# Patient Record
Sex: Male | Born: 1935 | Race: White | Hispanic: No | Smoking: Former smoker
Health system: Southern US, Community
[De-identification: ages and names within clinical notes are randomized; demographics above are authoritative.]

## PROBLEM LIST (undated history)

## (undated) DIAGNOSIS — K219 Gastro-esophageal reflux disease without esophagitis: Secondary | ICD-10-CM

## (undated) DIAGNOSIS — T8182XA Emphysema (subcutaneous) resulting from a procedure, initial encounter: Secondary | ICD-10-CM

## (undated) DIAGNOSIS — I714 Abdominal aortic aneurysm, without rupture: Secondary | ICD-10-CM

## (undated) DIAGNOSIS — I10 Essential (primary) hypertension: Secondary | ICD-10-CM

## (undated) DIAGNOSIS — J309 Allergic rhinitis, unspecified: Secondary | ICD-10-CM

## (undated) DIAGNOSIS — R430 Anosmia: Secondary | ICD-10-CM

## (undated) DIAGNOSIS — K5790 Diverticulosis of intestine, part unspecified, without perforation or abscess without bleeding: Secondary | ICD-10-CM

## (undated) DIAGNOSIS — N400 Enlarged prostate without lower urinary tract symptoms: Secondary | ICD-10-CM

## (undated) DIAGNOSIS — Z8719 Personal history of other diseases of the digestive system: Secondary | ICD-10-CM

## (undated) DIAGNOSIS — I739 Peripheral vascular disease, unspecified: Secondary | ICD-10-CM

## (undated) DIAGNOSIS — J449 Chronic obstructive pulmonary disease, unspecified: Secondary | ICD-10-CM

## (undated) HISTORY — PX: CHOLECYSTECTOMY: SHX55

## (undated) HISTORY — DX: Anosmia: R43.0

## (undated) HISTORY — DX: Gastro-esophageal reflux disease without esophagitis: K21.9

## (undated) HISTORY — PX: APPENDECTOMY: SHX54

## (undated) HISTORY — PX: COLONOSCOPY: SHX174

## (undated) HISTORY — DX: Allergic rhinitis, unspecified: J30.9

## (undated) HISTORY — DX: Diverticulosis of intestine, part unspecified, without perforation or abscess without bleeding: K57.90

## (undated) HISTORY — PX: SKIN CANCER EXCISION: SHX779

## (undated) HISTORY — DX: Chronic obstructive pulmonary disease, unspecified: J44.9

---

## 2003-10-19 ENCOUNTER — Encounter: Admission: RE | Admit: 2003-10-19 | Discharge: 2003-10-19 | Payer: Self-pay | Admitting: Neurosurgery

## 2003-12-18 ENCOUNTER — Other Ambulatory Visit: Payer: Self-pay

## 2004-12-25 IMAGING — CT CT ANGIO HEAD
3 of 4 series · 13 of 30 positions shown · IV contrast (OMNI 350)
Comparison: none

CLINICAL DATA: Patient with right middle cerebral artery aneurysm ? follow-up examination.
CT ANGIO HEAD
After an explanation of the procedure, an informed consent was obtained.  Initially a CT of the brain without infusion was performed.  This was then followed by infusion of approximately 150 cc of Omnipaque 350 in antecubital vein followed by contiguous transaxial thin axial acquisitions from the level of approximately C4 to the cranial vertex.  The information was then transferred on to a work station where 3D reformations were performed in the axial, coronal and sagittal planes.
There were no acute complications. The patient tolerated the procedure well.

[Series 2: — · axial · 0.49mm/px · z∈[-53,+32]mm · 2 of 51 slices shown (1 of 2)]
[im 17/51  brain]
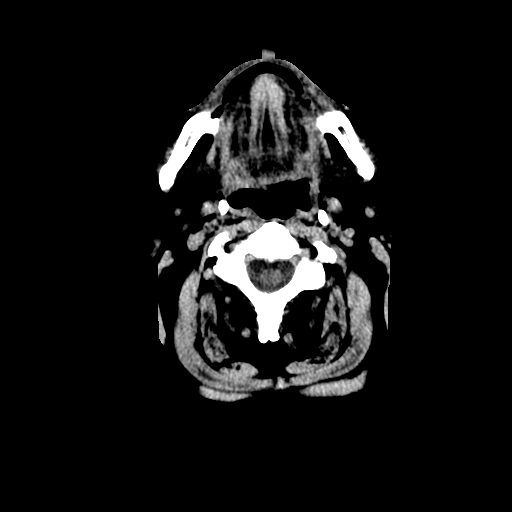
[im 34/51  bone]
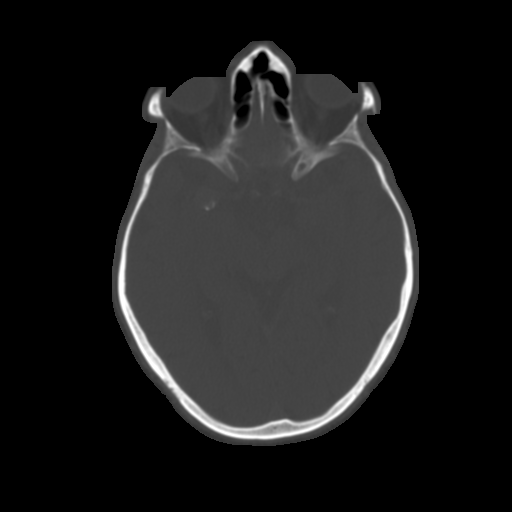

[Series 4: — · axial · 0.33mm/px · z∈[-48,+60]mm · 3 of 87 slices shown (2 of 2)]
[im 22/87  brain]
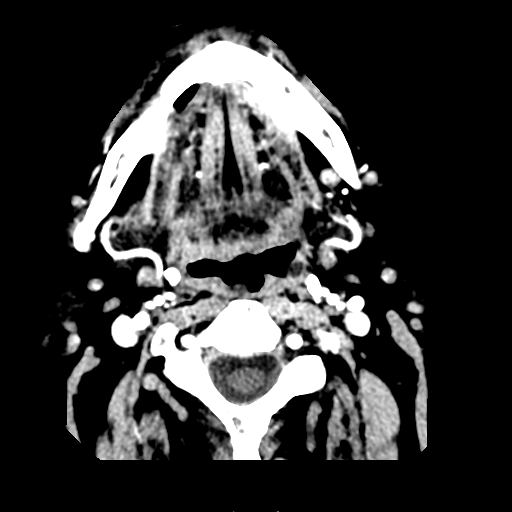
[im 44/87  brain]
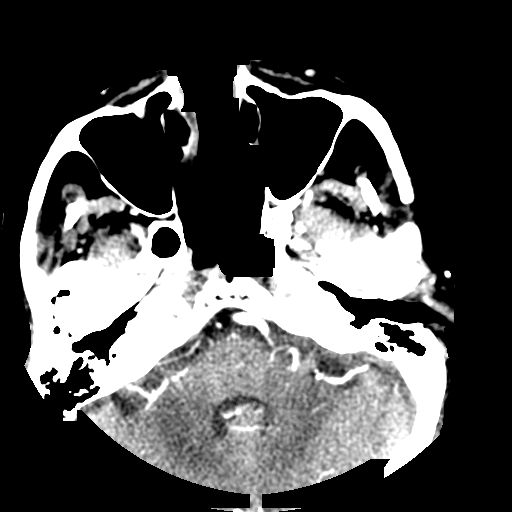
[im 65/87  brain]
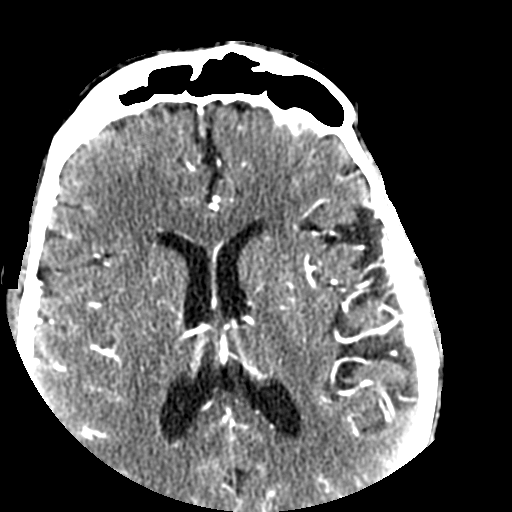

[Series 5: recon 2: · axial · 0.33mm/px · z∈[-78,+92]mm · 8 of 345 slices shown]
[im 37/345  brain]
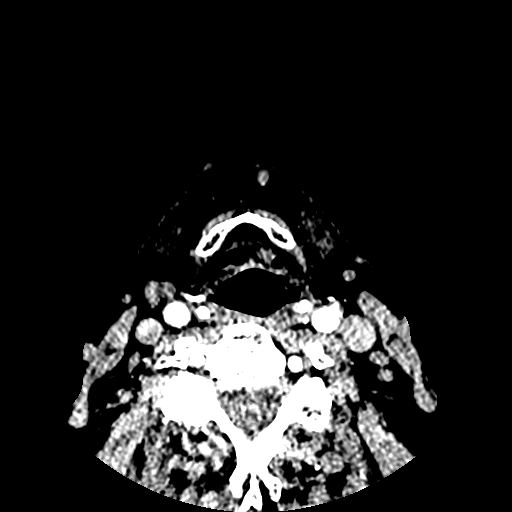
[im 73/345  brain]
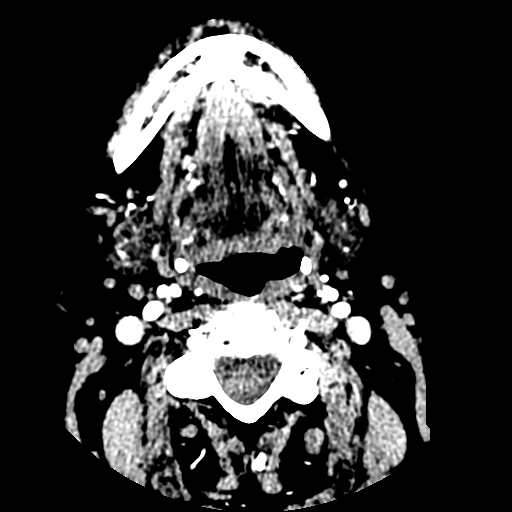
[im 109/345  brain]
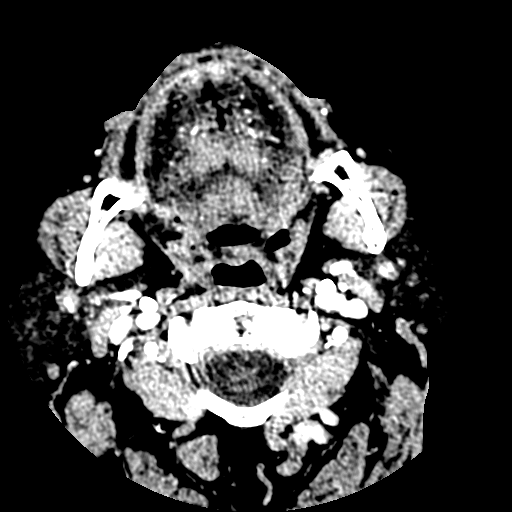
[im 145/345  brain]
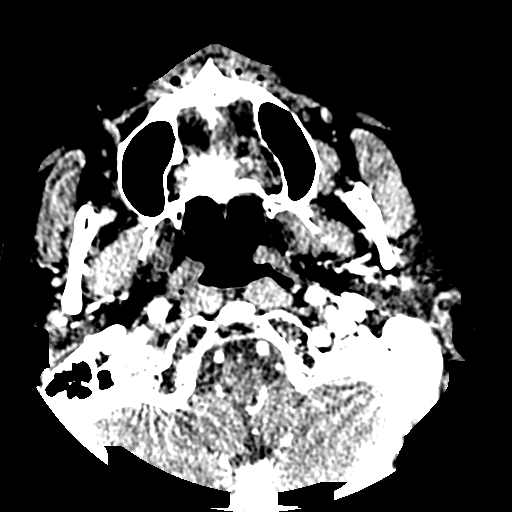
[im 200/345  brain]
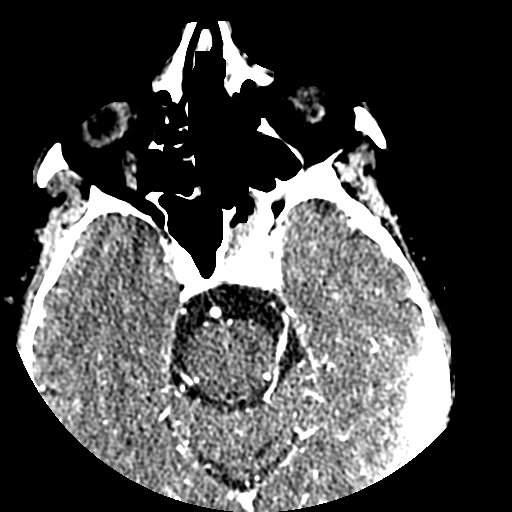
[im 236/345  brain]
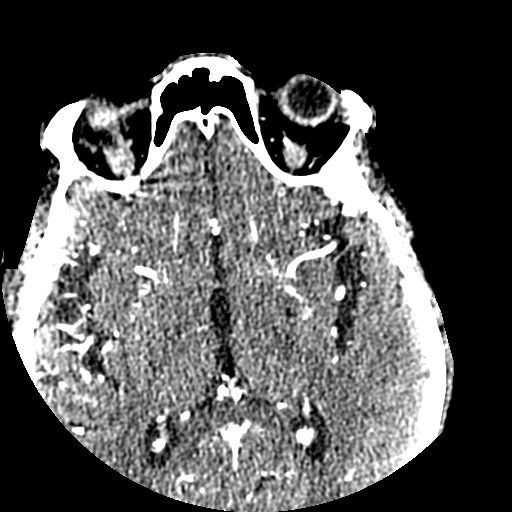
[im 272/345  brain]
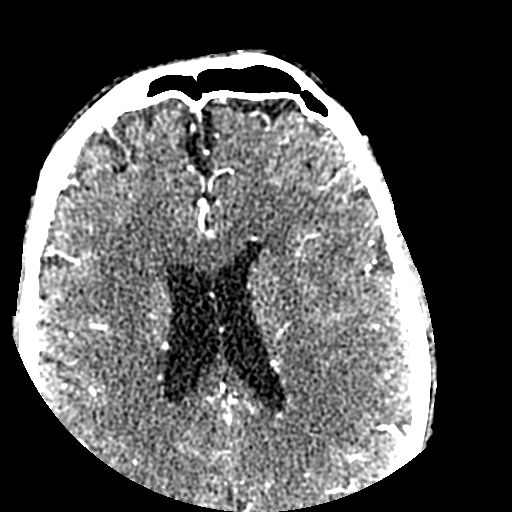
[im 308/345  brain]
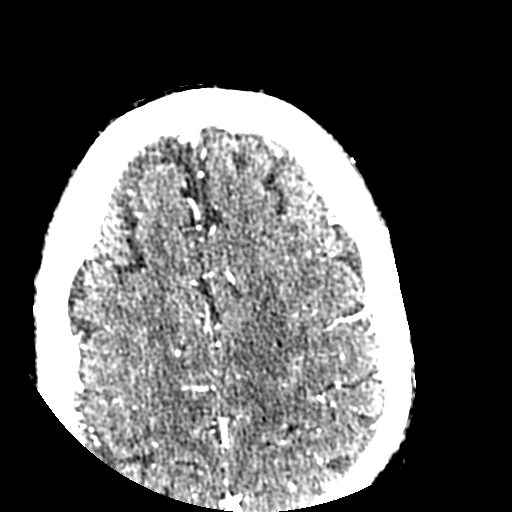

[13 of 30 positions shown; findings below may reference images not displayed]

FINDINGS: The right carotid bifurcation demonstrated the external carotid artery and its visualized branches to be normal. The right internal carotid artery demonstrates mild atherosclerotic plaque without overlying stenoses.  The right internal carotid artery otherwise is seen to opacify normally to the intracranial skull base. The visualized petrous cavernous portions are normal.  The right P.COM is patent.  
The right middle cerebral artery proximally and the right anterior cerebral artery also appeared normal.  
In the right MCA trifurcation region, there is an approximately 11.2 mm x an 11 mm saccular aneurysm with a focal area of rim-like calcification posteriorly and inferiorly.  The neck of this aneurysm is approximately 2 mm and is seen to be separate from the superior and inferior division of the right MCA. 
The MCA trifurcations otherwise appear normal.
The right anterior cerebral artery in the pericallosal regions is normal.  
The left common carotid bifurcation demonstrates the left external carotid artery to be normal.  The left internal carotid artery at the bulb and distally is normal.  The visualized portion of the petrous cavernous portions are normal.  The supraclinoid segment is normal.  The left middle and the left anterior cerebral arteries demonstrate normal opacification. 
Both vertebral arteries are patent with the left being slightly dominant.  These ascend in the foramina transversarium to the cranial skull base where there is normal patency of both posteroinferior cerebellar arteries.  The confluence of the basilar artery is normal with the basilar artery demonstrating normal opacification.  The posterior cerebral arteries and the superior cerebral arteries and the anteroinferior cerebral arteries that are visualized proximally are normal. There is a suggestion of a right P.COM.
No abnormal enhancing parenchymal masses are seen.  The preinfusion images demonstrate a focal area of calcification in the right MCA trifurcation region.  The small focal calcific area could be an aneurysm.  The gray/white matter differentiation is normal.  There is mild prominence of the cerebral sulci in the periSylvian regions and bifrontally.  The ventricles otherwise appear normal.  The visualized mastoid air cells are normal as are the paranasal sinuses.  The orbital contents are unremarkable for the patient?s age. 
IMPRESSION
1.  Approximately 11.2 mm x 11 mm saccular aneurysm in the right middle cerebral artery trifurcation region with a 2 mm neck that is separate from the MCA branches.  
2.  No other aneurysm is identified on the images provided. 
3.  If available, comparison with previous studies would be helpful to evaluate for interval changes in the right MCA trifurcation aneurysm.  
*Dr. Geller was informed of this at approximately 2 pm on 10/24/03.

## 2012-04-07 ENCOUNTER — Emergency Department: Payer: Self-pay | Admitting: Emergency Medicine

## 2012-04-07 LAB — URINALYSIS, COMPLETE
Bacteria: NONE SEEN
Bilirubin,UR: NEGATIVE
Blood: NEGATIVE
Ketone: NEGATIVE
Leukocyte Esterase: NEGATIVE
RBC,UR: <1 /HPF (ref 0–5)
Squamous Epithelial: <1

## 2012-04-07 LAB — BASIC METABOLIC PANEL
Anion Gap: 13 (ref 7–16)
BUN: 9 mg/dL (ref 7–18)
Chloride: 100 mmol/L (ref 98–107)
Co2: 26 mmol/L (ref 21–32)
Creatinine: 0.75 mg/dL (ref 0.60–1.30)
EGFR (African American): >60
Osmolality: 279 (ref 275–301)

## 2012-04-07 LAB — CBC WITH DIFFERENTIAL/PLATELET
Basophil #: 0 10*3/uL (ref 0.0–0.1)
Eosinophil #: 0 10*3/uL (ref 0.0–0.7)
Lymphocyte %: 12.7 %
MCHC: 32.6 g/dL (ref 32.0–36.0)
Neutrophil #: 5.3 10*3/uL (ref 1.4–6.5)
Neutrophil %: 78.2 %
RDW: 13 % (ref 11.5–14.5)

## 2012-04-07 LAB — TROPONIN I: Troponin-I: <0.02 ng/mL

## 2012-09-21 HISTORY — PX: TOTAL KNEE ARTHROPLASTY: SHX125

## 2015-02-20 DIAGNOSIS — Z8719 Personal history of other diseases of the digestive system: Secondary | ICD-10-CM

## 2015-02-20 DIAGNOSIS — I714 Abdominal aortic aneurysm, without rupture: Secondary | ICD-10-CM

## 2015-02-20 DIAGNOSIS — I7143 Infrarenal abdominal aortic aneurysm, without rupture: Secondary | ICD-10-CM

## 2015-02-20 DIAGNOSIS — I739 Peripheral vascular disease, unspecified: Secondary | ICD-10-CM

## 2015-02-20 HISTORY — DX: Peripheral vascular disease, unspecified: I73.9

## 2015-02-20 HISTORY — DX: Personal history of other diseases of the digestive system: Z87.19

## 2015-02-20 HISTORY — DX: Abdominal aortic aneurysm, without rupture: I71.4

## 2015-02-20 HISTORY — DX: Infrarenal abdominal aortic aneurysm, without rupture: I71.43

## 2015-06-01 ENCOUNTER — Encounter (HOSPITAL_COMMUNITY): Payer: Self-pay | Admitting: Emergency Medicine

## 2015-06-01 ENCOUNTER — Emergency Department (HOSPITAL_COMMUNITY)
Admission: EM | Admit: 2015-06-01 | Discharge: 2015-06-01 | Disposition: A | Discharge status: Home / Self Care | Payer: Non-veteran care | Attending: Emergency Medicine | Admitting: Emergency Medicine

## 2015-06-01 DIAGNOSIS — Y9289 Other specified places as the place of occurrence of the external cause: Secondary | ICD-10-CM | POA: Diagnosis not present

## 2015-06-01 DIAGNOSIS — I10 Essential (primary) hypertension: Secondary | ICD-10-CM | POA: Diagnosis not present

## 2015-06-01 DIAGNOSIS — S61411A Laceration without foreign body of right hand, initial encounter: Secondary | ICD-10-CM | POA: Diagnosis present

## 2015-06-01 DIAGNOSIS — Y9389 Activity, other specified: Secondary | ICD-10-CM | POA: Diagnosis not present

## 2015-06-01 DIAGNOSIS — Y998 Other external cause status: Secondary | ICD-10-CM | POA: Diagnosis not present

## 2015-06-01 DIAGNOSIS — W260XXA Contact with knife, initial encounter: Secondary | ICD-10-CM | POA: Insufficient documentation

## 2015-06-01 HISTORY — DX: Essential (primary) hypertension: I10

## 2015-06-01 MED ORDER — LIDOCAINE HCL (PF) 1 % IJ SOLN
5.0000 mL | Freq: Once | INTRAMUSCULAR | Status: AC
  Administered 2015-06-01: 5 mL
  Filled 2015-06-01: qty 5

## 2015-06-01 NOTE — ED Notes (Signed)
Laceration to right hand from knife while cutting green pepper.

## 2015-06-01 NOTE — Discharge Instructions (Signed)
Your wound was repaired with 3 sutures. Please have these removed in 7-10 days. Please see your primary physician, or return to the emergency department if any signs of infection. Please keep this clean and dry. Sutured Wound Care Sutures are stitches that can be used to close wounds. Wound care helps prevent pain and infection.  HOME CARE INSTRUCTIONS   Rest and elevate the injured area until all the pain and swelling are gone.  Only take over-the-counter or prescription medicines for pain, discomfort, or fever as directed by your caregiver.  After 48 hours, gently wash the area with mild soap and water once a day, or as directed. Rinse off the soap. Pat the area dry with a clean towel. Do not rub the wound. This may cause bleeding.  Follow your caregiver's instructions for how often to change the bandage (dressing). Stop using a dressing after 2 days or after the wound stops draining.  If the dressing sticks, moisten it with soapy water and gently remove it.  Apply ointment on the wound as directed.  Avoid stretching a sutured wound.  Drink enough fluids to keep your urine clear or pale yellow.  Follow up with your caregiver for suture removal as directed.  Use sunscreen on your wound for the next 3 to 6 months so the scar will not darken. SEEK IMMEDIATE MEDICAL CARE IF:   Your wound becomes red, swollen, hot, or tender.  You have increasing pain in the wound.  You have a red streak that extends from the wound.  There is pus coming from the wound.  You have a fever.  You have shaking chills.  There is a bad smell coming from the wound.  You have persistent bleeding from the wound. MAKE SURE YOU:   Understand these instructions.  Will watch your condition.  Will get help right away if you are not doing well or get worse. Document Released: 10/15/2004 Document Revised: 11/30/2011 Document Reviewed: 01/11/2011 [facility] Patient Information 2015 [place], [place]. This  information is not intended to replace advice given to you by your health care provider. Make sure you discuss any questions you have with your health care provider.

## 2015-06-01 NOTE — ED Notes (Signed)
Approx. 1.5cm laceration noted to palm in right hand. States he was cutting green pepper and cut hand. No bleeding noted. Pt reports tetanus within last 5 years.

## 2015-06-01 NOTE — ED Provider Notes (Signed)
CSN: [id number]     Arrival date & time 06/01/15  1628 History   First MD Initiated Contact with Patient 06/01/15 1656     Chief Complaint  Patient presents with  . Hand Injury     (Consider location/radiation/quality/duration/timing/severity/associated sxs/prior Treatment) HPI Comments: Patient is an 81 year old male who presents to the emergency department with a complaint of laceration to the right hand. The patient states that he was cutting Her, when he cut his hand. He was able to control the bleeding with pressure. He has good range of motion of his fingers. No other lacerations were reported. The patient states that his last tetanus shot was 5 years ago. He has not had any recent operations or procedures involving the right hand.  The history is provided by the patient.    Past Medical History  Diagnosis Date  . Hypertension    Past Surgical History  Procedure Laterality Date  . Appendectomy    . Knee surgery     No family history on file. Social History  Substance Use Topics  . Smoking status: Never Smoker   . Smokeless tobacco: None  . Alcohol Use: Yes     Comment: rare    Review of Systems  Musculoskeletal: Positive for arthralgias.  All other systems reviewed and are negative.     Allergies  Review of patient's allergies indicates not on file.  Home Medications   Prior to Admission medications   Not on File   BP 167/78 mmHg  Pulse 88  Temp(Src) 98 F (36.7 C)  Resp 18  Ht 5\' 8"  (1.727 m)  Wt 173 lb (78.472 kg)  BMI 26.31 kg/m2  SpO2 96% Physical Exam  Constitutional: He is oriented to person, place, and time. He appears well-developed and well-nourished.  Non-toxic appearance.  HENT:  Head: Normocephalic.  Right Ear: Tympanic membrane and external ear normal.  Left Ear: Tympanic membrane and external ear normal.  Eyes: EOM and lids are normal. Pupils are equal, round, and reactive to light.  Neck: Normal range of motion. Neck supple.  Carotid bruit is not present.  Cardiovascular: Normal rate, regular rhythm, normal heart sounds, intact distal pulses and normal pulses.   Pulmonary/Chest: Breath sounds normal. No respiratory distress.  Abdominal: Soft. Bowel sounds are normal. There is no tenderness. There is no guarding.  Musculoskeletal: Normal range of motion.       Right hand: He exhibits tenderness and laceration. He exhibits normal range of motion, normal capillary refill and no deformity.  Lymphadenopathy:       Head (right side): No submandibular adenopathy present.       Head (left side): No submandibular adenopathy present.    He has no cervical adenopathy.  Neurological: He is alert and oriented to person, place, and time. He has normal strength. No cranial nerve deficit or sensory deficit.  Skin: Skin is warm and dry.  Psychiatric: He has a normal mood and affect. His speech is normal.  Nursing note and vitals reviewed.   ED Course  Patient states he has an allergy to Novocain, but can take lidocaine without any problem.   LACERATION REPAIR Date/Time: 06/01/2015 6:03 PM Performed by: Lily Kocher Authorized by: Lily Kocher Consent: Verbal consent obtained. Risks and benefits: risks, benefits and alternatives were discussed Consent given by: patient Patient understanding: patient states understanding of the procedure being performed Patient identity confirmed: arm band Time out: Immediately prior to procedure a "time out" was called to verify the correct patient,  procedure, equipment, support staff and site/side marked as required. Body area: upper extremity Location details: right hand Laceration length: 1.5 cm Foreign bodies: no foreign bodies Vascular damage: no Anesthesia: local infiltration Local anesthetic: lidocaine 1% without epinephrine Patient sedated: no Preparation: Patient was prepped and draped in the usual sterile fashion. Irrigation solution: saline Amount of cleaning:  standard Skin closure: 4-0 nylon Number of sutures: 3 Approximation: close Approximation difficulty: simple Dressing: gauze roll Patient tolerance: Patient tolerated the procedure well with no immediate complications   (including critical care time) Labs Review Labs Reviewed - No data to display  Imaging Review No results found. I have personally reviewed and evaluated these images and lab results as part of my medical decision-making.   EKG Interpretation None      MDM  Vital signs reviewed. Pulse oximetry is 96% on room air. Patient sustained a laceration to the palmar surface of the right hand. The patient is neurovascularly intact. The wound was repaired with 3 interrupted sutures of 4-0 nylon. The patient is to have the sutures removed in 7-10 days. He will return sooner if any signs of infection.    Final diagnoses:  None    *I have reviewed nursing notes, vital signs, and all appropriate lab and imaging results for this patient.[address], PA-C 06/01/15 1809  Fredia Sorrow, MD 06/01/15 [phone]

## 2016-03-19 ENCOUNTER — Encounter (HOSPITAL_COMMUNITY): Payer: Self-pay | Admitting: *Deleted

## 2016-03-19 DIAGNOSIS — K921 Melena: Secondary | ICD-10-CM | POA: Diagnosis not present

## 2016-03-19 DIAGNOSIS — N400 Enlarged prostate without lower urinary tract symptoms: Secondary | ICD-10-CM | POA: Diagnosis not present

## 2016-03-19 DIAGNOSIS — I10 Essential (primary) hypertension: Secondary | ICD-10-CM | POA: Insufficient documentation

## 2016-03-19 DIAGNOSIS — K922 Gastrointestinal hemorrhage, unspecified: Secondary | ICD-10-CM | POA: Diagnosis not present

## 2016-03-19 DIAGNOSIS — J449 Chronic obstructive pulmonary disease, unspecified: Secondary | ICD-10-CM | POA: Insufficient documentation

## 2016-03-19 DIAGNOSIS — K4090 Unilateral inguinal hernia, without obstruction or gangrene, not specified as recurrent: Secondary | ICD-10-CM | POA: Diagnosis not present

## 2016-03-19 DIAGNOSIS — K5730 Diverticulosis of large intestine without perforation or abscess without bleeding: Secondary | ICD-10-CM | POA: Insufficient documentation

## 2016-03-19 DIAGNOSIS — I714 Abdominal aortic aneurysm, without rupture: Secondary | ICD-10-CM | POA: Insufficient documentation

## 2016-03-19 DIAGNOSIS — Z85828 Personal history of other malignant neoplasm of skin: Secondary | ICD-10-CM | POA: Insufficient documentation

## 2016-03-19 DIAGNOSIS — K625 Hemorrhage of anus and rectum: Secondary | ICD-10-CM | POA: Diagnosis present

## 2016-03-19 NOTE — ED Notes (Signed)
Pt states he has an abdominal aortic aneurysm. States that he is supposed to have an operation. States that he has had two large bright red bloody stools tonight.

## 2016-03-20 ENCOUNTER — Emergency Department (HOSPITAL_COMMUNITY): Payer: Non-veteran care

## 2016-03-20 ENCOUNTER — Encounter (HOSPITAL_COMMUNITY): Payer: Self-pay | Admitting: Physician Assistant

## 2016-03-20 ENCOUNTER — Observation Stay (HOSPITAL_COMMUNITY)
Admission: EM | Admit: 2016-03-20 | Discharge: 2016-03-22 | Disposition: A | Discharge status: Home / Self Care | Payer: Non-veteran care | Attending: Internal Medicine | Admitting: Internal Medicine

## 2016-03-20 DIAGNOSIS — I714 Abdominal aortic aneurysm, without rupture, unspecified: Secondary | ICD-10-CM | POA: Diagnosis present

## 2016-03-20 DIAGNOSIS — K625 Hemorrhage of anus and rectum: Secondary | ICD-10-CM | POA: Diagnosis present

## 2016-03-20 DIAGNOSIS — K5731 Diverticulosis of large intestine without perforation or abscess with bleeding: Secondary | ICD-10-CM | POA: Diagnosis present

## 2016-03-20 HISTORY — DX: Personal history of other diseases of the digestive system: Z87.19

## 2016-03-20 HISTORY — DX: Emphysema (subcutaneous) resulting from a procedure, initial encounter: T81.82XA

## 2016-03-20 HISTORY — DX: Abdominal aortic aneurysm, without rupture: I71.4

## 2016-03-20 HISTORY — DX: Peripheral vascular disease, unspecified: I73.9

## 2016-03-20 HISTORY — DX: Benign prostatic hyperplasia without lower urinary tract symptoms: N40.0

## 2016-03-20 LAB — CBC
HCT: 42.9 % (ref 39.0–52.0)
Hemoglobin: 14.4 g/dL (ref 13.0–17.0)
MCH: 31.9 pg (ref 26.0–34.0)
MCHC: 33.6 g/dL (ref 30.0–36.0)
MCV: 94.9 fL (ref 78.0–100.0)
PLATELETS: 182 10*3/uL (ref 150–400)
RBC: 4.52 MIL/uL (ref 4.22–5.81)
RDW: 12.5 % (ref 11.5–15.5)
WBC: 6.8 10*3/uL (ref 4.0–10.5)

## 2016-03-20 LAB — COMPREHENSIVE METABOLIC PANEL
ALBUMIN: 3.8 g/dL (ref 3.5–5.0)
ALK PHOS: 62 U/L (ref 38–126)
ALT: 22 U/L (ref 17–63)
AST: 41 U/L (ref 15–41)
Anion gap: 7 (ref 5–15)
BUN: 9 mg/dL (ref 6–20)
CALCIUM: 9.4 mg/dL (ref 8.9–10.3)
CO2: 28 mmol/L (ref 22–32)
CREATININE: 0.74 mg/dL (ref 0.61–1.24)
Chloride: 98 mmol/L — ABNORMAL LOW (ref 101–111)
GFR calc Af Amer: >60 mL/min (ref >60)
GFR calc non Af Amer: >60 mL/min (ref >60)
GLUCOSE: 119 mg/dL — AB (ref 65–99)
Potassium: 3.5 mmol/L (ref 3.5–5.1)
SODIUM: 133 mmol/L — AB (ref 135–145)
Total Bilirubin: 0.8 mg/dL (ref 0.3–1.2)
Total Protein: 7.1 g/dL (ref 6.5–8.1)

## 2016-03-20 LAB — ABO/RH: ABO/RH(D): B POS

## 2016-03-20 LAB — HEMOGLOBIN AND HEMATOCRIT, BLOOD
HCT: 41.6 % (ref 39.0–52.0)
Hemoglobin: 14 g/dL (ref 13.0–17.0)

## 2016-03-20 LAB — PROTIME-INR
INR: 1.09 (ref 0.00–1.49)
PROTHROMBIN TIME: 14.3 s (ref 11.6–15.2)

## 2016-03-20 LAB — TYPE AND SCREEN
ABO/RH(D): B POS
Antibody Screen: NEGATIVE

## 2016-03-20 LAB — POC OCCULT BLOOD, ED: Fecal Occult Bld: POSITIVE — AB

## 2016-03-20 MED ORDER — FLUTICASONE PROPIONATE 50 MCG/ACT NA SUSP
1.0000 | Freq: Every day | NASAL | Status: DC
  Administered 2016-03-21: 1 via NASAL
  Filled 2016-03-20 (×2): qty 16

## 2016-03-20 MED ORDER — OXYCODONE HCL 5 MG PO TABS: 5.0000 mg | ORAL_TABLET | ORAL | Status: DC | PRN

## 2016-03-20 MED ORDER — SODIUM CHLORIDE 0.9% FLUSH
3.0000 mL | Freq: Two times a day (BID) | INTRAVENOUS | Status: DC
  Administered 2016-03-20 – 2016-03-21 (×4): 3 mL via INTRAVENOUS

## 2016-03-20 MED ORDER — IPRATROPIUM BROMIDE 0.06 % NA SOLN
2.0000 | Freq: Two times a day (BID) | NASAL | Status: DC
  Administered 2016-03-21: 2 via NASAL
  Filled 2016-03-20: qty 15

## 2016-03-20 MED ORDER — IOPAMIDOL (ISOVUE-370) INJECTION 76%
INTRAVENOUS | Status: AC
  Administered 2016-03-20: 100 mL
  Filled 2016-03-20: qty 100

## 2016-03-20 MED ORDER — ADULT MULTIVITAMIN W/MINERALS CH
1.0000 | ORAL_TABLET | Freq: Every day | ORAL | Status: DC
  Administered 2016-03-20 – 2016-03-21 (×2): 1 via ORAL
  Filled 2016-03-20 (×2): qty 1

## 2016-03-20 MED ORDER — ALBUTEROL SULFATE (2.5 MG/3ML) 0.083% IN NEBU: 3.0000 mL | INHALATION_SOLUTION | Freq: Four times a day (QID) | RESPIRATORY_TRACT | Status: DC | PRN

## 2016-03-20 MED ORDER — TAMSULOSIN HCL 0.4 MG PO CAPS
0.4000 mg | ORAL_CAPSULE | Freq: Every day | ORAL | Status: DC
  Administered 2016-03-20 – 2016-03-21 (×2): 0.4 mg via ORAL
  Filled 2016-03-20 (×2): qty 1

## 2016-03-20 MED ORDER — SENNOSIDES-DOCUSATE SODIUM 8.6-50 MG PO TABS
1.0000 | ORAL_TABLET | Freq: Two times a day (BID) | ORAL | Status: DC
  Administered 2016-03-20 – 2016-03-21 (×4): 1 via ORAL
  Filled 2016-03-20 (×4): qty 1

## 2016-03-20 MED ORDER — CALCIUM CARBONATE-VITAMIN D 500-200 MG-UNIT PO TABS
1.0000 | ORAL_TABLET | Freq: Two times a day (BID) | ORAL | Status: DC
  Administered 2016-03-20 – 2016-03-21 (×4): 1 via ORAL
  Filled 2016-03-20 (×4): qty 1

## 2016-03-20 MED ORDER — GABAPENTIN 100 MG PO CAPS
200.0000 mg | ORAL_CAPSULE | Freq: Every day | ORAL | Status: DC
  Filled 2016-03-20 (×2): qty 2

## 2016-03-20 NOTE — Consult Note (Signed)
[facility] Consult: 10:37 AM 03/20/2016  LOS: 0 days    Referring Provider: Dr Fanny Bien.  Primary Care Physician:  Gets care at [facility] clinic: Dr Domenica Fail.  Primary Gastroenterologist:  unassigned    Reason for Consultation:  Painless hematochezia.     HPI: Steven Elliott is a 82 y.o. male.  Hx htn.  AAA (5.4 wide x 10.4 cm length on CTA 01/2016 at [facility])  . COPD/Emphysma. Inguinal hernias.  Colon diverticulosis.  ASPVD.  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  Obesity.  BPH 2 lifetime colonoscopies, polyp(s) on the first, none on the 2nd which was done ~ 2007.  Told he needed no further colonoscopies.  Not aware of diverticulosis.   Hx of minor hemorrhoidal bleeding, none in a few years.  Takes 3 senna per day which allows for daily BMs, has had constipation since childhood. Previous remote EGD(s): has GER.  Takes PPI prn, in previous years took it daily when sxs more frequent.  Describes intermittent dysphagia mostly to pills, in region of upper esophagus.  No hx food impaction or dilation of esophageal strictures.       No NSAIDs or ASA, no recent changes in [facility] [place] [facility] (vascular Dr Sharalyn Ink) and he needs cardiac clearance before surgery, but surgery is anticipated for July/August 2017.    At 8 and 10 PM last night episodes of large volume hematochezia with some clotted blood.  No abdominal pain.  No weakness, dizziness, syncope, diaphoresis.  No episodes or stools since 10PM.  Some slight, fleeting queasiness and weakness in ED Stable vital signs, no hypotension or tachycardia.   Hgb 14.4, platelets 182, MCV 94.  BUN/Creat normal.  LFTs normal.  CT Angio abd/pelvis: 5.4 cm infrarenal AAA.  The celiac axis, SMA, renal arteries, IMA patent. Both  iliac arteries are patent but  heavily calcified.  Extensive colonic diverticulosis.  Large inguinal hernias bilaterally, containing a knuckle of unobstructed sigmoid on left.     Past Medical History  Diagnosis Date  . Hypertension   . Aneurysm of infrarenal abdominal aorta (HCC) 02/2015    5.4 cm by CT 03/20/16.   . Emphysema (subcutaneous) (surgical) resulting from a procedure     severe by CT 03/20/16  . History of bilateral inguinal hernias 02/2015  . Peripheral vascular disease ([place]) 02/2015    heavily calcified but patent bil iliac arteries by CTA 03/20/16.  Marland Kitchen BPH (benign prostatic hyperplasia)     Past Surgical History  Procedure Laterality Date  . Appendectomy    . Total knee arthroplasty Left 2014  . Colonoscopy  ~ 2007    at [facility]  . Skin cancer excision      Prior to Admission medications   Medication Sig Start Date End Date Taking? Authorizing Provider  albuterol (PROVENTIL HFA;VENTOLIN HFA) 108 (90 Base) MCG/ACT inhaler Inhale 2 puffs into the lungs every 6 (six) hours as needed for wheezing or shortness of breath.   Yes Historical Provider, MD  Calcium  Carbonate-Vitamin D (CALCIUM-D) 600-400 MG-UNIT TABS Take 1 tablet by mouth 2 (two) times daily.   Yes Historical Provider, MD  cetirizine (ZYRTEC) 10 MG tablet Take 10 mg by mouth daily.   Yes Historical Provider, MD  fluticasone (FLONASE) 50 MCG/ACT nasal spray Place 1 spray into both nostrils daily.   Yes Historical Provider, MD  gabapentin (NEURONTIN) 100 MG capsule Take 200 mg by mouth at bedtime.   Yes Historical Provider, MD  GARLIC PO Take 1 capsule by mouth daily.   Yes Historical Provider, MD  hydrochlorothiazide (HYDRODIURIL) 25 MG tablet Take 12.5 mg by mouth daily.   Yes Historical Provider, MD  ipratropium (ATROVENT) 0.06 % nasal spray Place 2 sprays into both nostrils 2 (two) times daily.   Yes Historical Provider, MD  LACTOBACILLUS PO Take 2 tablets by mouth daily.   Yes Historical Provider, MD    Multiple Vitamin (MULTIVITAMIN WITH MINERALS) TABS tablet Take 1 tablet by mouth daily.   Yes Historical Provider, MD  oxyCODONE (OXY IR/ROXICODONE) 5 MG immediate release tablet Take 5 mg by mouth every 4 (four) hours as needed for severe pain.   Yes Historical Provider, MD  senna-docusate (SENOKOT-S) 8.6-50 MG tablet Take 1 tablet by mouth 2 (two) times daily.   Yes Historical Provider, MD  tamsulosin (FLOMAX) 0.4 MG CAPS capsule Take 0.4 mg by mouth at bedtime.   Yes Historical Provider, MD    Scheduled Meds: . calcium-vitamin D  1 tablet Oral BID  . fluticasone  1 spray Each Nare Daily  . gabapentin  200 mg Oral QHS  . ipratropium  2 spray Each Nare BID  . multivitamin with minerals  1 tablet Oral Daily  . senna-docusate  1 tablet Oral BID  . sodium chloride flush  3 mL Intravenous Q12H  . tamsulosin  0.4 mg Oral QHS   Infusions:   PRN Meds: albuterol, oxyCODONE   Allergies as of 03/19/2016  . (No Known Allergies)    Family History  Problem Relation Age of Onset  . Breast cancer Mother   . Pancreatitis Mother     Social History   Social History  . Marital Status: Widowed since 2013    Spouse Name: N/A  . Number of Children: N/A  . Years of Education: N/A   Occupational History  . Not on file.   Social History Main Topics  . Smoking status: Never Smoker   . Smokeless tobacco: Not on file  . Alcohol Use: Yes     Comment: drinks at most 2 to 3 beers per month.   . Drug Use: No  . Sexual Activity: Not on file   Other Topics Concern  . Not on file   Social History Narrative    REVIEW OF SYSTEMS: Constitutional:  Stable weight, good appetite.  No falls.  Little excercise ENT:  No nose bleeds.  Got new set of dentures 3 weeks ago and has non-bleeding ulcer on right front gum from the denture Pulm:  Chronic mostly non-productive cough.  Denies DOE but does not exercise.  CV:  No palpitations, no LE edema. No chest pain.  bil fatigue in legs with stair  climbing, no claudication. No previous MI or cardiac cath.   GU:  No hematuria, no frequency GI:  Per HPI Heme:  No previous anemia or unusual bleeding.    Transfusions:  none Neuro:  No headaches.  Paroxysmal shooting, neuropathic type pain in legs.  Nocturnal leg cramps Endocrine:  No sweats or chills.  No polyuria or dysuria Immunization:  Not queried.    PHYSICAL EXAM: Vital signs in last 24 hours: Filed Vitals:   03/20/16 0430 03/20/16 0504  BP: 141/80 155/68  Pulse: 80 81  Temp:  98 F (36.7 C)  Resp: 17 16   Wt Readings from Last 3 Encounters:  03/20/16 77.338 kg (170 lb 8 oz)  06/01/15 78.472 kg (173 lb)   General: pleasant, alert, appears non-ill and looks stated age.   Head:  No asymmetry or swelling  Eyes:  No pallor or icterus Ears:  Slight HOH  Nose:  No congestion or discharge Mouth:  Dentures in place, full set.  Shallow ulcer on right gum, just inside the corner of his mouth Neck:  No mass, no JVD, no TMG Lungs:  Clear bil.  Slight cough.  No SOB Heart: RRR.  No mrg.  S1/S2 present.   Abdomen:  Soft, NT, ND.  bil inguinal hernias without tenderness.  No HSM.  No bruits.  No pulsatile mass. .   Rectal: deferrred   Musc/Skeltl: no joint swelling, erythema or contracture.  TKR scar on left knee Extremities:  No CCE  Neurologic:  Oriented x 3.  Fully alert.  No limb weakness or gross deficit.   Skin:  No rash or supicious lesions.  No telangectasias Tattoos:  None seen Nodes:  No cervical adenopathy   Psych:  Calm, cooperative, engaged.   Intake/Output from previous day: 06/29 0701 - 06/30 0700 In: -  Out: 225 [Urine:225] Intake/Output this shift: Total I/O In: 3 [I.V.:3] Out: -   LAB RESULTS:  Recent Labs  03/20/16 0005  WBC 6.8  HGB 14.4  HCT 42.9  PLT 182   BMET Lab Results  Component Value Date   NA 133* 03/20/2016   NA 139 04/07/2012   K 3.5 03/20/2016   K 3.4* 04/07/2012   CL 98* 03/20/2016   CL 100 04/07/2012   CO2 28  03/20/2016   CO2 26 04/07/2012   GLUCOSE 119* 03/20/2016   GLUCOSE 143* 04/07/2012   BUN 9 03/20/2016   BUN 9 04/07/2012   CREATININE 0.74 03/20/2016   CREATININE 0.75 04/07/2012   CALCIUM 9.4 03/20/2016   CALCIUM 8.7 04/07/2012   LFT  Recent Labs  03/20/16 0005  PROT 7.1  ALBUMIN 3.8  AST 41  ALT 22  ALKPHOS 62  BILITOT 0.8   PT/INR No results found for: INR, PROTIME Hepatitis Panel No results for input(s): HEPBSAG, HCVAB, HEPAIGM, HEPBIGM in the last 72 hours. C-Diff No components found for: CDIFF Lipase  No results found for: LIPASE  Drugs of Abuse  No results found for: LABOPIA, COCAINSCRNUR, LABBENZ, AMPHETMU, THCU, LABBARB   RADIOLOGY STUDIES: Ct Cta Abd/pel W/cm &/or W/o Cm  03/20/2016  CLINICAL DATA:  Abdominal pain.  Known abdominal aortic aneurysm EXAM: CTA ABDOMEN AND PELVIS wITHOUT AND WITH CONTRAST TECHNIQUE: Multidetector CT imaging of the abdomen and pelvis was performed using the standard protocol during bolus administration of intravenous contrast. Multiplanar reconstructed images and MIPs were obtained and reviewed to evaluate the vascular anatomy. CONTRAST:  100 mL Isovue 370 intravenous COMPARISON:  None. FINDINGS: There is a 5.4 cm infrarenal abdominal aortic aneurysm. There is no aneurysm leak or retroperitoneal hemorrhage. The celiac axis and SMA are patent. The renal arteries are patent. IMA is patent. Both iliac arteries are patent, heavily calcified. Review of the MIP images confirms the above findings. Nonvascular findings in the abdomen and pelvis: There are normal appearances of the  liver, gallbladder and bile ducts. The pancreas is normal. The spleen is normal. The adrenals and kidneys are normal in appearance. There is no urinary calculus evident. There is no hydronephrosis or ureteral dilatation. Collecting systems and ureters appear unremarkable. The stomach, small bowel and colon are remarkable only for mild uncomplicated colonic diverticulosis.  The proximal sigmoid colon distends a short distance into a large left inguinal hernia, nonobstructing. No acute findings are evident in the lower chest. Severe emphysematous disease and hyperinflation noted. No significant skeletal lesion is evident. Large inguinal hernias are present bilaterally, containing only fat on the right and containing fat as well as a knuckle of the proximal sigmoid colon on the left. IMPRESSION: 1. 5.4 cm infrarenal abdominal aortic aneurysm without leak or retroperitoneal hemorrhage. Vascular surgery consultation recommended due to increased risk of rupture for AAA >5.5 cm. This recommendation follows ACR consensus guidelines: White Paper of the ACR Incidental Findings Committee II on Vascular Findings. J Am Coll Radiol 2013; 10:789-794. 2. Extensive diverticulosis without evidence of diverticulitis or other acute inflammatory process. 3. Large inguinal hernias bilaterally, containing a knuckle of unobstructed sigmoid on the left. Electronically Signed   By: Andreas Newport M.D.   On: 03/20/2016 03:36    ENDOSCOPIC STUDIES: Per HPI  IMPRESSION:   *  Painless hematochezia.  Diverticulosis by CT.  *  5.4 cm infrarenal AAA.  Calcified iliacs but CT.  Being [facility] at [facility] with plans for surgical repair ~ July vs August 2017.  Has to have cardiac clearance beforehand, this in works in [place] inguinal hernias  *  Emphysema.     PLAN:     *  At next lab draw, get coags.  *  If recurrent large volume heatochezia, nuc med study.   Given his infrarenal AAA, not sure Colonoscopy is safe.  Also if he continues to have no recurrent bleeding, colonoscopy not necessarily indicated even if no AAA.    Azucena Freed  03/20/2016, 10:37 AM Pager: [phone]

## 2016-03-20 NOTE — H&P (Addendum)
History and Physical    Steven Elliott [id number] DOB: 1930/03/27 DOA: 03/20/2016   PCP: No PCP Per Patient Chief Complaint:  Chief Complaint  Patient presents with  . GI Bleeding    HPI: Steven Elliott is a 82 y.o. 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  Patient presents to the ED with c/o 2 episodes of BRBPR.  Symptoms onset earlier this evening, last episode was at 10pm last night.  2 large BMs with bright red blood.  No history of same.  Painless.  Does have AAA repair planned to be scheduled at [facility] in the next month or two.  ED Course: CT abd shows 5.4cm AAA with no obvious fistula, bleed or other complication.  Shows diverticulosis without diverticulitis.  HGB 14.4.  Review of Systems: As per HPI otherwise 10 point review of systems negative.    Past Medical History  Diagnosis Date  . Hypertension     Past Surgical History  Procedure Laterality Date  . Appendectomy    . Knee surgery       reports that he has never smoked. He does not have any smokeless tobacco history on file. He reports that he drinks alcohol. He reports that he does not use illicit drugs.  Allergies  Allergen Reactions  . Procaine Anaphylaxis  . Ciprofloxacin Other (See Comments) and Nausea And Vomiting    Malaise, Rigor  . Hydrocodone-Acetaminophen Itching    Watery eyes  . Omeprazole Other (See Comments)    GI reaction  . Penicillins Other (See Comments)  . Rabeprazole Other (See Comments)    GI reaction    No family history on file. No known family history of GIB, daughter is alive and at bedside.   Prior to Admission medications   Medication Sig Start Date End Date Taking? Authorizing Provider  albuterol (PROVENTIL HFA;VENTOLIN HFA) 108 (90 Base) MCG/ACT inhaler Inhale 2 puffs into the lungs every 6 (six) hours as needed for wheezing or shortness of breath.   Yes Historical Provider, MD  Calcium Carbonate-Vitamin D (CALCIUM-D) 600-400  MG-UNIT TABS Take 1 tablet by mouth 2 (two) times daily.   Yes Historical Provider, MD  cetirizine (ZYRTEC) 10 MG tablet Take 10 mg by mouth daily.   Yes Historical Provider, MD  fluticasone (FLONASE) 50 MCG/ACT nasal spray Place 1 spray into both nostrils daily.   Yes Historical Provider, MD  gabapentin (NEURONTIN) 100 MG capsule Take 200 mg by mouth at bedtime.   Yes Historical Provider, MD  GARLIC PO Take 1 capsule by mouth daily.   Yes Historical Provider, MD  hydrochlorothiazide (HYDRODIURIL) 25 MG tablet Take 12.5 mg by mouth daily.   Yes Historical Provider, MD  ipratropium (ATROVENT) 0.06 % nasal spray Place 2 sprays into both nostrils 2 (two) times daily.   Yes Historical Provider, MD  LACTOBACILLUS PO Take 2 tablets by mouth daily.   Yes Historical Provider, MD  Multiple Vitamin (MULTIVITAMIN WITH MINERALS) TABS tablet Take 1 tablet by mouth daily.   Yes Historical Provider, MD  oxyCODONE (OXY IR/ROXICODONE) 5 MG immediate release tablet Take 5 mg by mouth every 4 (four) hours as needed for severe pain.   Yes Historical Provider, MD  senna-docusate (SENOKOT-S) 8.6-50 MG tablet Take 1 tablet by mouth 2 (two) times daily.   Yes Historical Provider, MD  tamsulosin (FLOMAX) 0.4 MG CAPS capsule Take 0.4 mg by mouth at bedtime.   Yes Historical Provider, MD    Physical Exam: Filed Vitals:  03/20/16 0230 03/20/16 0320 03/20/16 0330 03/20/16 0430  BP: 146/68 137/70 120/72 141/80  Pulse: 76 82 85 80  Temp:      TempSrc:      Resp: 11 18 14 17   Height:      Weight:      SpO2: 97% 99% 99% 97%      Constitutional: NAD, calm, comfortable Eyes: PERRL, lids and conjunctivae normal ENMT: Mucous membranes are moist. Posterior pharynx clear of any exudate or lesions.Normal dentition.  Neck: normal, supple, no masses, no thyromegaly Respiratory: clear to auscultation bilaterally, no wheezing, no crackles. Normal respiratory effort. No accessory muscle use.  Cardiovascular: Regular rate and  rhythm, no murmurs / rubs / gallops. No extremity edema. 2+ pedal pulses. No carotid bruits.  Abdomen: no tenderness, no masses palpated. No hepatosplenomegaly. Bowel sounds positive.  Musculoskeletal: no clubbing / cyanosis. No joint deformity upper and lower extremities. Good ROM, no contractures. Normal muscle tone.  Skin: no rashes, lesions, ulcers. No induration Neurologic: CN 2-12 grossly intact. Sensation intact, DTR normal. Strength 5/5 in all 4.  Psychiatric: Normal judgment and insight. Alert and oriented x 3. Normal mood.    Labs on Admission: I have personally reviewed following labs and imaging studies  CBC:  Recent Labs Lab 03/20/16 0005  WBC 6.8  HGB 14.4  HCT 42.9  MCV 94.9  PLT [id number]   Basic Metabolic Panel:  Recent Labs Lab 03/20/16 0005  NA 133*  K 3.5  CL 98*  CO2 28  GLUCOSE 119*  BUN 9  CREATININE 0.74  CALCIUM 9.4   GFR: Estimated Creatinine Clearance: 66.5 mL/min (by C-G formula based on Cr of 0.74). Liver Function Tests:  Recent Labs Lab 03/20/16 0005  AST 41  ALT 22  ALKPHOS 62  BILITOT 0.8  PROT 7.1  ALBUMIN 3.8   No results for input(s): LIPASE, AMYLASE in the last 168 hours. No results for input(s): AMMONIA in the last 168 hours. Coagulation Profile: No results for input(s): INR, PROTIME in the last 168 hours. Cardiac Enzymes: No results for input(s): CKTOTAL, CKMB, CKMBINDEX, TROPONINI in the last 168 hours. BNP (last 3 results) No results for input(s): PROBNP in the last 8760 hours. HbA1C: No results for input(s): HGBA1C in the last 72 hours. CBG: No results for input(s): GLUCAP in the last 168 hours. Lipid Profile: No results for input(s): CHOL, HDL, LDLCALC, TRIG, CHOLHDL, LDLDIRECT in the last 72 hours. Thyroid Function Tests: No results for input(s): TSH, T4TOTAL, FREET4, T3FREE, THYROIDAB in the last 72 hours. Anemia Panel: No results for input(s): VITAMINB12, FOLATE, FERRITIN, TIBC, IRON, RETICCTPCT in the last 72  hours. Urine analysis:    Component Value Date/Time   COLORURINE Yellow 04/07/2012 1408   APPEARANCEUR Clear 04/07/2012 1408   LABSPEC 1.009 04/07/2012 1408   PHURINE 6.0 04/07/2012 1408   GLUCOSEU Negative 04/07/2012 1408   HGBUR Negative 04/07/2012 1408   BILIRUBINUR Negative 04/07/2012 1408   KETONESUR Negative 04/07/2012 1408   PROTEINUR Negative 04/07/2012 1408   NITRITE Negative 04/07/2012 1408   LEUKOCYTESUR Negative 04/07/2012 1408   Sepsis Labs: @LABRCNTIP (procalcitonin:4,lacticidven:4) )No results found for this or any previous visit (from the past 240 hour(s)).   Radiological Exams on Admission: Ct Cta Abd/pel W/cm &/or W/o Cm  03/20/2016  CLINICAL DATA:  Abdominal pain.  Known abdominal aortic aneurysm EXAM: CTA ABDOMEN AND PELVIS wITHOUT AND WITH CONTRAST TECHNIQUE: Multidetector CT imaging of the abdomen and pelvis was performed using the standard protocol during bolus administration of  intravenous contrast. Multiplanar reconstructed images and MIPs were obtained and reviewed to evaluate the vascular anatomy. CONTRAST:  100 mL Isovue 370 intravenous COMPARISON:  None. FINDINGS: There is a 5.4 cm infrarenal abdominal aortic aneurysm. There is no aneurysm leak or retroperitoneal hemorrhage. The celiac axis and SMA are patent. The renal arteries are patent. IMA is patent. Both iliac arteries are patent, heavily calcified. Review of the MIP images confirms the above findings. Nonvascular findings in the abdomen and pelvis: There are normal appearances of the liver, gallbladder and bile ducts. The pancreas is normal. The spleen is normal. The adrenals and kidneys are normal in appearance. There is no urinary calculus evident. There is no hydronephrosis or ureteral dilatation. Collecting systems and ureters appear unremarkable. The stomach, small bowel and colon are remarkable only for mild uncomplicated colonic diverticulosis. The proximal sigmoid colon distends a short distance into a  large left inguinal hernia, nonobstructing. No acute findings are evident in the lower chest. Severe emphysematous disease and hyperinflation noted. No significant skeletal lesion is evident. Large inguinal hernias are present bilaterally, containing only fat on the right and containing fat as well as a knuckle of the proximal sigmoid colon on the left. IMPRESSION: 1. 5.4 cm infrarenal abdominal aortic aneurysm without leak or retroperitoneal hemorrhage. Vascular surgery consultation recommended due to increased risk of rupture for AAA >5.5 cm. This recommendation follows ACR consensus guidelines: White Paper of the ACR Incidental Findings Committee II on Vascular Findings. J Am Coll Radiol 2013; 10:789-794. 2. Extensive diverticulosis without evidence of diverticulitis or other acute inflammatory process. 3. Large inguinal hernias bilaterally, containing a knuckle of unobstructed sigmoid on the left. Electronically Signed   By: Andreas Newport M.D.   On: 03/20/2016 03:36    EKG: Independently reviewed.  Assessment/Plan Active Problems:   Rectal bleeding   BRBPR - likely diverticular bleed, seems to have already slowed / stopped with last episode some 7 hours ago.  Repeat H/H ordered for noon  Clear liquid diet  Holding HCTZ  Call GI in AM  AAA - 5.4 cm  Patient is already being seen by vascular surgery at [facility] with plans to schedule operative repair in the next month or two (awaiting date).   DVT prophylaxis: SCDs Code Status: Full Family Communication: Daughter at bedside Consults called: None Admission status: Admit to inpatient   Etta Quill DO [facility] Pager [phone] from 7PM-7AM  If 7AM-7PM, please contact the day physician for the patient [email or web address] Password [facility]  03/20/2016, 4:35 AM

## 2016-03-20 NOTE — Progress Notes (Signed)
Pt seen and examined, admitted earlier this am by Dr.Gardner 86/M with AAA being worked at at [employer] for Surgery in 1 few months, HTN, BPH presented to ED with 2 episodes of BRBPR, painless, last one at 10pm last night, CT with Extensive diverticulosis -Hopefully subsiding -Clears, monitor Hb -If recurs may need Tagged RBC scan -will request GI eval -h/o colonoscopy ? 8-10years ago at [name], per report normal and had a polyp prior to that.  Domenic Polite, MD [phone]

## 2016-03-20 NOTE — ED Notes (Signed)
Admitting at bedside 

## 2016-03-20 NOTE — ED Notes (Signed)
EDP at bedside  

## 2016-03-20 NOTE — ED Provider Notes (Signed)
CSN: [id number]     Arrival date & time 03/19/16  2342 History   By signing my name below, I, Steven Elliott, attest that this documentation has been prepared under the direction and in the presence of Steven Fraise, MD.  Electronically Signed: Maud Deed. Royston Elliott, ED Scribe. 03/20/2016. 12:26 AM.   Chief Complaint  Patient presents with  . GI Bleeding   The history is provided by the patient. No language interpreter was used.    HPI Comments: Steven Elliott is a 82 y.o. male with a PMHx of AAA measuring 5.4 cm, hemorrhoids, and HTN who presents to the Emergency Department complaining of constant, unchanged rectal bleeding onset this evening. He reports 2 large stools described as both dark/black and bright red in color. Denies any prior history of same. However, family reports 2 previous episodes of epistaxis in the past. No recent fever, chills, nausea, vomiting, abdominal pain, or worsening back pain. He denies any overuse of NSAIDS. Steven Elliott is currently awaiting a scheduled date to have his Aorta repaired at [facility] in the next month or 2. He is not currently on any anticoagulant therapy. No known allergies to medications.  PCP: No PCP Per Patient    Past Medical History  Diagnosis Date  . Hypertension    Past Surgical History  Procedure Laterality Date  . Appendectomy    . Knee surgery     No family history on file. Social History  Substance Use Topics  . Smoking status: Never Smoker   . Smokeless tobacco: None  . Alcohol Use: Yes     Comment: rare    Review of Systems  Constitutional: Negative for fever and chills.  Respiratory: Negative for shortness of breath.   Cardiovascular: Negative for chest pain.  Gastrointestinal: Positive for rectal pain. Negative for vomiting, abdominal pain and diarrhea.  Neurological: Negative for headaches.  Psychiatric/Behavioral: Negative for confusion.  All other systems reviewed and are negative.     Allergies  Review of  patient's allergies indicates no known allergies.  Home Medications   Prior to Admission medications   Not on File   Triage Vitals: BP 140/82 mmHg  Pulse 85  Temp(Src) 98.4 F (36.9 C) (Oral)  Resp 18  Ht 5\' 8"  (1.727 m)  Wt 170 lb (77.111 kg)  BMI 25.85 kg/m2  SpO2 98%   Physical Exam  CONSTITUTIONAL: Well developed/well nourished HEAD: Normocephalic/atraumatic EYES: EOMI/PERRL ENMT: Mucous membranes moist NECK: supple no meningeal signs SPINE/BACK:entire spine nontender CV: S1/S2 noted, no murmurs/rubs/gallops noted LUNGS: Lungs are clear to auscultation bilaterally, no apparent distress ABDOMEN: soft, nontender, no rebound or guarding, bowel sounds noted throughout abdomen GU:no cva tenderness RECTAL: Bright red blood noted. Examination chaperoned by Steven Elliott.  NEURO: Pt is awake/alert/appropriate, moves all extremitiesx4.  No facial droop.   EXTREMITIES: pulses normal/equal, full ROM SKIN: warm, color normal PSYCH: no abnormalities of mood noted, alert and oriented to situation   ED Course  Procedures   DIAGNOSTIC STUDIES: Oxygen Saturation is 98% on RA, Normal by my interpretation.    COORDINATION OF CARE: 12:23 AM- Will order blood work and imaging. Discussed treatment plan with pt at bedside and pt agreed to plan.    pt stable at this time Apparently he has large AAA with active GI bleed Will need Ct imaging to evaluate for any acute AAA complications Pt will then need admit for GI bleed He is agreeable for blood products if necessary 4:12 AM Pt stable No significant  bleeding episodes during ED stay Does not require emergent transfusion CT reveals AAA without complications Will admit for GI bleed D/w dr Steven Elliott for admission  Labs Review Labs Reviewed  COMPREHENSIVE METABOLIC PANEL - Abnormal; Notable for the following:    Sodium 133 (*)    Chloride 98 (*)    Glucose, Bld 119 (*)    All other components within normal limits  POC OCCULT  BLOOD, ED - Abnormal; Notable for the following:    Fecal Occult Bld POSITIVE (*)    All other components within normal limits  CBC  HEMOGLOBIN AND HEMATOCRIT, BLOOD  TYPE AND SCREEN  ABO/RH    Imaging Review Ct Cta Abd/pel W/cm &/or W/o Cm  03/20/2016  CLINICAL DATA:  Abdominal pain.  Known abdominal aortic aneurysm EXAM: CTA ABDOMEN AND PELVIS wITHOUT AND WITH CONTRAST TECHNIQUE: Multidetector CT imaging of the abdomen and pelvis was performed using the standard protocol during bolus administration of intravenous contrast. Multiplanar reconstructed images and MIPs were obtained and reviewed to evaluate the vascular anatomy. CONTRAST:  100 mL Isovue 370 intravenous COMPARISON:  None. FINDINGS: There is a 5.4 cm infrarenal abdominal aortic aneurysm. There is no aneurysm leak or retroperitoneal hemorrhage. The celiac axis and SMA are patent. The renal arteries are patent. IMA is patent. Both iliac arteries are patent, heavily calcified. Review of the MIP images confirms the above findings. Nonvascular findings in the abdomen and pelvis: There are normal appearances of the liver, gallbladder and bile ducts. The pancreas is normal. The spleen is normal. The adrenals and kidneys are normal in appearance. There is no urinary calculus evident. There is no hydronephrosis or ureteral dilatation. Collecting systems and ureters appear unremarkable. The stomach, small bowel and colon are remarkable only for mild uncomplicated colonic diverticulosis. The proximal sigmoid colon distends a short distance into a large left inguinal hernia, nonobstructing. No acute findings are evident in the lower chest. Severe emphysematous disease and hyperinflation noted. No significant skeletal lesion is evident. Large inguinal hernias are present bilaterally, containing only fat on the right and containing fat as well as a knuckle of the proximal sigmoid colon on the left. IMPRESSION: 1. 5.4 cm infrarenal abdominal aortic aneurysm  without leak or retroperitoneal hemorrhage. Vascular surgery consultation recommended due to increased risk of rupture for AAA >5.5 cm. This recommendation follows ACR consensus guidelines: White Paper of the ACR Incidental Findings Committee II on Vascular Findings. J Am Coll Radiol 2013; 10:789-794. 2. Extensive diverticulosis without evidence of diverticulitis or other acute inflammatory process. 3. Large inguinal hernias bilaterally, containing a knuckle of unobstructed sigmoid on the left. Electronically Signed   By: Andreas Newport M.D.   On: 03/20/2016 03:36   I have personally reviewed and evaluated these lab results as part of my medical decision-making.   MDM   Final diagnoses:  Rectal bleeding    Nursing notes including past medical history and social history reviewed and considered in documentation Labs/vital reviewed myself and considered during evaluation    I personally performed the services described in this documentation, which was scribed in my presence. The recorded information has been reviewed and is accurate.       Steven Fraise, MD 03/20/16 [phone]

## 2016-03-21 DIAGNOSIS — K625 Hemorrhage of anus and rectum: Secondary | ICD-10-CM | POA: Diagnosis not present

## 2016-03-21 LAB — CBC
HCT: 39.8 % (ref 39.0–52.0)
HCT: 43.6 % (ref 39.0–52.0)
Hemoglobin: 13.3 g/dL (ref 13.0–17.0)
Hemoglobin: 14.5 g/dL (ref 13.0–17.0)
MCH: 31.9 pg (ref 26.0–34.0)
MCH: 32.4 pg (ref 26.0–34.0)
MCHC: 33.4 g/dL (ref 30.0–36.0)
MCHC: 33.3 g/dL (ref 30.0–36.0)
MCV: 95.4 fL (ref 78.0–100.0)
MCV: 97.3 fL (ref 78.0–100.0)
PLATELETS: 197 10*3/uL (ref 150–400)
Platelets: 167 10*3/uL (ref 150–400)
RBC: 4.17 MIL/uL — ABNORMAL LOW (ref 4.22–5.81)
RBC: 4.48 MIL/uL (ref 4.22–5.81)
RDW: 12.6 % (ref 11.5–15.5)
RDW: 12.6 % (ref 11.5–15.5)
WBC: 6.1 10*3/uL (ref 4.0–10.5)
WBC: 7.7 10*3/uL (ref 4.0–10.5)

## 2016-03-21 LAB — BASIC METABOLIC PANEL
Anion gap: 5 (ref 5–15)
CALCIUM: 8.9 mg/dL (ref 8.9–10.3)
CO2: 28 mmol/L (ref 22–32)
CREATININE: 0.68 mg/dL (ref 0.61–1.24)
Chloride: 99 mmol/L — ABNORMAL LOW (ref 101–111)
GFR calc Af Amer: >60 mL/min (ref >60)
GLUCOSE: 92 mg/dL (ref 65–99)
Potassium: 3.6 mmol/L (ref 3.5–5.1)
Sodium: 132 mmol/L — ABNORMAL LOW (ref 135–145)

## 2016-03-21 NOTE — Progress Notes (Signed)
No more BM's since this am. No passing any blood from rectum. Pt tolerating full liquids. Repeat hgb up to 14.5

## 2016-03-21 NOTE — Progress Notes (Signed)
Subjective: No further bloody bowel movements since yesterday afternoon.  Small bowel movement.  Objective: Vital signs in last 24 hours: Temp:  [97.9 F (36.6 C)-98.2 F (36.8 C)] 98.2 F (36.8 C) (07/01 0454) Pulse Rate:  [64-88] 78 (07/01 0454) Resp:  [16-18] 18 (07/01 0454) BP: (117-140)/(58-69) 121/64 mmHg (07/01 0454) SpO2:  [94 %-97 %] 94 % (07/01 0454) Weight:  [77.157 kg (170 lb 1.6 oz)] 77.157 kg (170 lb 1.6 oz) (07/01 0454) Last BM Date: 03/19/16  Intake/Output from previous day: 06/30 0701 - 07/01 0700 In: 963 [P.O.:960; I.V.:3] Out: 401 [Urine:400; Stool:1] Intake/Output this shift:    General appearance: alert and no distress GI: soft, non-tender; bowel sounds normal; no masses,  no organomegaly  Lab Results:  Recent Labs  03/20/16 0005 03/20/16 1403 03/21/16 0318  WBC 6.8  --  6.1  HGB 14.4 14.0 13.3  HCT 42.9 41.6 39.8  PLT 182  --  167   BMET  Recent Labs  03/20/16 0005 03/20/16 2343  NA 133* 132*  K 3.5 3.6  CL 98* 99*  CO2 28 28  GLUCOSE 119* 92  BUN 9 <5*  CREATININE 0.74 0.68  CALCIUM 9.4 8.9   LFT  Recent Labs  03/20/16 0005  PROT 7.1  ALBUMIN 3.8  AST 41  ALT 22  ALKPHOS 62  BILITOT 0.8   PT/INR  Recent Labs  03/20/16 1403  LABPROT 14.3  INR 1.09   Hepatitis Panel No results for input(s): HEPBSAG, HCVAB, HEPAIGM, HEPBIGM in the last 72 hours. C-Diff No results for input(s): CDIFFTOX in the last 72 hours. Fecal Lactopherrin No results for input(s): FECLLACTOFRN in the last 72 hours.  Studies/Results: Ct Cta Abd/pel W/cm &/or W/o Cm  03/20/2016  CLINICAL DATA:  Abdominal pain.  Known abdominal aortic aneurysm EXAM: CTA ABDOMEN AND PELVIS wITHOUT AND WITH CONTRAST TECHNIQUE: Multidetector CT imaging of the abdomen and pelvis was performed using the standard protocol during bolus administration of intravenous contrast. Multiplanar reconstructed images and MIPs were obtained and reviewed to evaluate the vascular  anatomy. CONTRAST:  100 mL Isovue 370 intravenous COMPARISON:  None. FINDINGS: There is a 5.4 cm infrarenal abdominal aortic aneurysm. There is no aneurysm leak or retroperitoneal hemorrhage. The celiac axis and SMA are patent. The renal arteries are patent. IMA is patent. Both iliac arteries are patent, heavily calcified. Review of the MIP images confirms the above findings. Nonvascular findings in the abdomen and pelvis: There are normal appearances of the liver, gallbladder and bile ducts. The pancreas is normal. The spleen is normal. The adrenals and kidneys are normal in appearance. There is no urinary calculus evident. There is no hydronephrosis or ureteral dilatation. Collecting systems and ureters appear unremarkable. The stomach, small bowel and colon are remarkable only for mild uncomplicated colonic diverticulosis. The proximal sigmoid colon distends a short distance into a large left inguinal hernia, nonobstructing. No acute findings are evident in the lower chest. Severe emphysematous disease and hyperinflation noted. No significant skeletal lesion is evident. Large inguinal hernias are present bilaterally, containing only fat on the right and containing fat as well as a knuckle of the proximal sigmoid colon on the left. IMPRESSION: 1. 5.4 cm infrarenal abdominal aortic aneurysm without leak or retroperitoneal hemorrhage. Vascular surgery consultation recommended due to increased risk of rupture for AAA >5.5 cm. This recommendation follows ACR consensus guidelines: White Paper of the ACR Incidental Findings Committee II on Vascular Findings. J Am Coll Radiol 2013; 10:789-794. 2. Extensive diverticulosis without evidence  of diverticulitis or other acute inflammatory process. 3. Large inguinal hernias bilaterally, containing a knuckle of unobstructed sigmoid on the left. Electronically Signed   By: Andreas Newport M.D.   On: 03/20/2016 03:36    Medications:  Scheduled: . calcium-vitamin D  1 tablet  Oral BID  . fluticasone  1 spray Each Nare Daily  . gabapentin  200 mg Oral QHS  . ipratropium  2 spray Each Nare BID  . multivitamin with minerals  1 tablet Oral Daily  . senna-docusate  1 tablet Oral BID  . sodium chloride flush  3 mL Intravenous Q12H  . tamsulosin  0.4 mg Oral QHS   Continuous:   Assessment/Plan: 1) Hematochezia - presumption is a diverticular bleed. 2) Infrarenal aneurysm - 5.4 cm,   If the patient does not have any further bleeding by the afternoon he can be discharged home and follow up with [facility].  At some point it appears that he will need Vascular to see him for the aneurysm.  HGB is stable.  Plan: 1)  Continue supportive care. 2) Signing off.  LOS: 1 day   Jaiana Sheffer D 03/21/2016, 7:47 AM

## 2016-03-21 NOTE — Progress Notes (Signed)
PROGRESS NOTE    Steven Elliott  [id number] DOB: Sep 08, 1930 DOA: 03/20/2016 PCP: No PCP Per Patient Brief Narrative: Steven Elliott is a 82 y.o. 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 Patient presents to the ED with c/o 2 episodes of BRBPR. Symptoms onset earlier this evening, last episode was at 10pm last night. 2 large BMs with bright red blood. No history of same. Painless. Does have AAA repair planned to be scheduled at [facility] in the next month or two.  Assessment & Plan:  BRBPR - suspected diverticular bleed,  -was starting to subside but had another episode this am -GI following -Hb stable, HCTZ on hold -clears for now, advance to Full liq if no further bleeding this afternoon -If recurs may need Tagged RBC scan -h/o colonoscopy ? 8-10years ago at [name], per report normal and had a polyp prior to that  AAA - 5.4 cm Patient followed by vascular surgery at [facility] with plans to schedule operative repair in the next month or two (awaiting date).  DVT prophylaxis: SCDs Code Status: Full Family Communication: Daughter at bedside  Consultants:   Gi-leb   Subjective: One episode of bleeding this am  Objective: Filed Vitals:   03/20/16 2113 03/20/16 2348 03/21/16 0454 03/21/16 1143  BP: 140/63 117/58 121/64 133/65  Pulse: 64 76 78 78  Temp: 98.1 F (36.7 C) 97.9 F (36.6 C) 98.2 F (36.8 C) 97.9 F (36.6 C)  TempSrc: Oral Oral Oral Oral  Resp: 18 18 18 18   Height:      Weight:   77.157 kg (170 lb 1.6 oz)   SpO2: 97% 94% 94% 97%    Intake/Output Summary (Last 24 hours) at 03/21/16 1257 Last data filed at 03/21/16 0947  Gross per 24 hour  Intake   1766 ml  Output    401 ml  Net   1365 ml   Filed Weights   03/19/16 2358 03/20/16 0500 03/21/16 0454  Weight: 77.111 kg (170 lb) 77.338 kg (170 lb 8 oz) 77.157 kg (170 lb 1.6 oz)    Examination:  General exam: Appears calm and comfortable  Respiratory system:  Clear to auscultation. Respiratory effort normal. Cardiovascular system: S1 & S2 heard, RRR. No JVD, murmurs, rubs, gallops or clicks. No pedal edema. Gastrointestinal system: Abdomen is nondistended, soft and nontender. No organomegaly or masses felt. Normal bowel sounds heard. Central nervous system: Alert and oriented. No focal neurological deficits. Extremities: Symmetric 5 x 5 power. Skin: No rashes, lesions or ulcers Psychiatry: Judgement and insight appear normal. Mood & affect appropriate.     Data Reviewed: I have personally reviewed following labs and imaging studies  CBC:  Recent Labs Lab 03/20/16 0005 03/20/16 1403 03/21/16 0318  WBC 6.8  --  6.1  HGB 14.4 14.0 13.3  HCT 42.9 41.6 39.8  MCV 94.9  --  95.4  PLT 182  --  [id number]   Basic Metabolic Panel:  Recent Labs Lab 03/20/16 0005 03/20/16 2343  NA 133* 132*  K 3.5 3.6  CL 98* 99*  CO2 28 28  GLUCOSE 119* 92  BUN 9 <5*  CREATININE 0.74 0.68  CALCIUM 9.4 8.9   GFR: Estimated Creatinine Clearance: 66.5 mL/min (by C-G formula based on Cr of 0.68). Liver Function Tests:  Recent Labs Lab 03/20/16 0005  AST 41  ALT 22  ALKPHOS 62  BILITOT 0.8  PROT 7.1  ALBUMIN 3.8   No results for input(s): LIPASE, AMYLASE in the last  168 hours. No results for input(s): AMMONIA in the last 168 hours. Coagulation Profile:  Recent Labs Lab 03/20/16 1403  INR 1.09   Cardiac Enzymes: No results for input(s): CKTOTAL, CKMB, CKMBINDEX, TROPONINI in the last 168 hours. BNP (last 3 results) No results for input(s): PROBNP in the last 8760 hours. HbA1C: No results for input(s): HGBA1C in the last 72 hours. CBG: No results for input(s): GLUCAP in the last 168 hours. Lipid Profile: No results for input(s): CHOL, HDL, LDLCALC, TRIG, CHOLHDL, LDLDIRECT in the last 72 hours. Thyroid Function Tests: No results for input(s): TSH, T4TOTAL, FREET4, T3FREE, THYROIDAB in the last 72 hours. Anemia Panel: No results for  input(s): VITAMINB12, FOLATE, FERRITIN, TIBC, IRON, RETICCTPCT in the last 72 hours. Urine analysis:    Component Value Date/Time   COLORURINE Yellow 04/07/2012 1408   APPEARANCEUR Clear 04/07/2012 1408   LABSPEC 1.009 04/07/2012 1408   PHURINE 6.0 04/07/2012 1408   GLUCOSEU Negative 04/07/2012 1408   HGBUR Negative 04/07/2012 1408   BILIRUBINUR Negative 04/07/2012 1408   KETONESUR Negative 04/07/2012 1408   PROTEINUR Negative 04/07/2012 1408   NITRITE Negative 04/07/2012 1408   LEUKOCYTESUR Negative 04/07/2012 1408   Sepsis Labs: @LABRCNTIP (procalcitonin:4,lacticidven:4)  )No results found for this or any previous visit (from the past 240 hour(s)).       Radiology Studies: Ct Cta Abd/pel W/cm &/or W/o Cm  03/20/2016  CLINICAL DATA:  Abdominal pain.  Known abdominal aortic aneurysm EXAM: CTA ABDOMEN AND PELVIS wITHOUT AND WITH CONTRAST TECHNIQUE: Multidetector CT imaging of the abdomen and pelvis was performed using the standard protocol during bolus administration of intravenous contrast. Multiplanar reconstructed images and MIPs were obtained and reviewed to evaluate the vascular anatomy. CONTRAST:  100 mL Isovue 370 intravenous COMPARISON:  None. FINDINGS: There is a 5.4 cm infrarenal abdominal aortic aneurysm. There is no aneurysm leak or retroperitoneal hemorrhage. The celiac axis and SMA are patent. The renal arteries are patent. IMA is patent. Both iliac arteries are patent, heavily calcified. Review of the MIP images confirms the above findings. Nonvascular findings in the abdomen and pelvis: There are normal appearances of the liver, gallbladder and bile ducts. The pancreas is normal. The spleen is normal. The adrenals and kidneys are normal in appearance. There is no urinary calculus evident. There is no hydronephrosis or ureteral dilatation. Collecting systems and ureters appear unremarkable. The stomach, small bowel and colon are remarkable only for mild uncomplicated colonic  diverticulosis. The proximal sigmoid colon distends a short distance into a large left inguinal hernia, nonobstructing. No acute findings are evident in the lower chest. Severe emphysematous disease and hyperinflation noted. No significant skeletal lesion is evident. Large inguinal hernias are present bilaterally, containing only fat on the right and containing fat as well as a knuckle of the proximal sigmoid colon on the left. IMPRESSION: 1. 5.4 cm infrarenal abdominal aortic aneurysm without leak or retroperitoneal hemorrhage. Vascular surgery consultation recommended due to increased risk of rupture for AAA >5.5 cm. This recommendation follows ACR consensus guidelines: White Paper of the ACR Incidental Findings Committee II on Vascular Findings. J Am Coll Radiol 2013; 10:789-794. 2. Extensive diverticulosis without evidence of diverticulitis or other acute inflammatory process. 3. Large inguinal hernias bilaterally, containing a knuckle of unobstructed sigmoid on the left. Electronically Signed   By: Andreas Newport M.D.   On: 03/20/2016 03:36        Scheduled Meds: . calcium-vitamin D  1 tablet Oral BID  . fluticasone  1 spray Each  Nare Daily  . gabapentin  200 mg Oral QHS  . ipratropium  2 spray Each Nare BID  . multivitamin with minerals  1 tablet Oral Daily  . senna-docusate  1 tablet Oral BID  . sodium chloride flush  3 mL Intravenous Q12H  . tamsulosin  0.4 mg Oral QHS   Continuous Infusions:    LOS: 1 day    Time spent: 8min    Domenic Polite, MD [facility] Pager [phone]  If 7PM-7AM, please contact night-coverage [email or web address] Password [facility] 03/21/2016, 12:57 PM

## 2016-03-22 DIAGNOSIS — K625 Hemorrhage of anus and rectum: Secondary | ICD-10-CM | POA: Diagnosis not present

## 2016-03-22 DIAGNOSIS — K5731 Diverticulosis of large intestine without perforation or abscess with bleeding: Secondary | ICD-10-CM | POA: Diagnosis present

## 2016-03-22 DIAGNOSIS — I714 Abdominal aortic aneurysm, without rupture, unspecified: Secondary | ICD-10-CM | POA: Diagnosis present

## 2016-03-22 LAB — BASIC METABOLIC PANEL
Anion gap: 7 (ref 5–15)
BUN: 5 mg/dL — AB (ref 6–20)
CHLORIDE: 102 mmol/L (ref 101–111)
CO2: 25 mmol/L (ref 22–32)
CREATININE: 0.63 mg/dL (ref 0.61–1.24)
Calcium: 9 mg/dL (ref 8.9–10.3)
GFR calc Af Amer: >60 mL/min (ref >60)
GFR calc non Af Amer: >60 mL/min (ref >60)
GLUCOSE: 89 mg/dL (ref 65–99)
Potassium: 4.1 mmol/L (ref 3.5–5.1)
Sodium: 134 mmol/L — ABNORMAL LOW (ref 135–145)

## 2016-03-22 LAB — CBC
HEMATOCRIT: 40.4 % (ref 39.0–52.0)
HEMOGLOBIN: 13.4 g/dL (ref 13.0–17.0)
MCH: 31.5 pg (ref 26.0–34.0)
MCHC: 33.2 g/dL (ref 30.0–36.0)
MCV: 95.1 fL (ref 78.0–100.0)
Platelets: 172 10*3/uL (ref 150–400)
RBC: 4.25 MIL/uL (ref 4.22–5.81)
RDW: 12.6 % (ref 11.5–15.5)
WBC: 6.2 10*3/uL (ref 4.0–10.5)

## 2016-03-22 NOTE — Progress Notes (Signed)
Pt had brown formed stool this am with blood still present, but much less than yesterday. D/v home with daughter

## 2016-04-01 NOTE — Discharge Summary (Signed)
Physician Discharge Summary  Steven Elliott [id number] DOB: [birth year] DOA: 03/20/2016  PCP: No PCP Per Patient  Admit date: 03/20/2016 Discharge date: 03/22/2016  Time spent: 35 minutes  Recommendations for Outpatient Follow-up:  1. PCP in 1 week 2. Vascular at [facility] for AAA surgery as scheduled   Discharge Diagnoses:  Principal Problem:   Rectal bleeding Active Problems:   Diverticular hemorrhage   Abdominal aortic aneurysm [facility])   Discharge Condition: stable  Diet recommendation: heart healthy  Filed Weights   03/20/16 0500 03/21/16 0454 03/22/16 0639  Weight: 77.338 kg (170 lb 8 oz) 77.157 kg (170 lb 1.6 oz) 76.34 kg (168 lb 4.8 oz)    History of present illness:  Steven Elliott is a 82 y.o. 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 Patient presents to the ED with c/o 2 episodes of BRBPR. Symptoms onset earlier this evening, last episode was at 10pm last night. 2 large BMs with bright red blood. No history of same. Painless. Does have AAA repair planned to be scheduled at [facility] in the next month or two.  Hospital Course:  BRBPR - suspected diverticular bleed,  -was starting to subside but had another episode this am -Gi consulted, resolved with supportive care, observation only -h/o colonoscopy ? 8-10years ago at [name], per report normal and had a polyp prior to that -advised to FU with GI at [place]  AAA - 5.4 cm Patient followed by vascular surgery at [facility] with plans to schedule operative repair in the next month or two (awaiting date).  Consultations:  [facility]  Discharge Exam: Filed Vitals:   03/21/16 2056 03/22/16 0648  BP: 131/53 122/56  Pulse: 71 80  Temp: 98 F (36.7 C) 98.4 F (36.9 C)  Resp: 18 18    General: AAOx3 Cardiovascular: S1S2/RRR Respiratory: CTAB  Discharge Instructions   Discharge Instructions    Diet - low sodium heart healthy    Complete by:  As directed      Increase  activity slowly    Complete by:  As directed           Discharge Medication List as of 03/22/2016  9:03 AM    CONTINUE these medications which have NOT CHANGED   Details  albuterol (PROVENTIL HFA;VENTOLIN HFA) 108 (90 Base) MCG/ACT inhaler Inhale 2 puffs into the lungs every 6 (six) hours as needed for wheezing or shortness of breath., Until Discontinued, Historical Med    Calcium Carbonate-Vitamin D (CALCIUM-D) 600-400 MG-UNIT TABS Take 1 tablet by mouth 2 (two) times daily., Until Discontinued, Historical Med    cetirizine (ZYRTEC) 10 MG tablet Take 10 mg by mouth daily., Until Discontinued, Historical Med    fluticasone (FLONASE) 50 MCG/ACT nasal spray Place 1 spray into both nostrils daily., Until Discontinued, Historical Med    gabapentin (NEURONTIN) 100 MG capsule Take 200 mg by mouth at bedtime., Until Discontinued, Historical Med    GARLIC PO Take 1 capsule by mouth daily., Until Discontinued, Historical Med    hydrochlorothiazide (HYDRODIURIL) 25 MG tablet Take 12.5 mg by mouth daily., Until Discontinued, Historical Med    ipratropium (ATROVENT) 0.06 % nasal spray Place 2 sprays into both nostrils 2 (two) times daily., Until Discontinued, Historical Med    LACTOBACILLUS PO Take 2 tablets by mouth daily., Until Discontinued, Historical Med    Multiple Vitamin (MULTIVITAMIN WITH MINERALS) TABS tablet Take 1 tablet by mouth daily., Until Discontinued, Historical Med    oxyCODONE (OXY IR/ROXICODONE) 5 MG immediate  release tablet Take 5 mg by mouth every 4 (four) hours as needed for severe pain., Until Discontinued, Historical Med    senna-docusate (SENOKOT-S) 8.6-50 MG tablet Take 1 tablet by mouth 2 (two) times daily., Until Discontinued, Historical Med    tamsulosin (FLOMAX) 0.4 MG CAPS capsule Take 0.4 mg by mouth at bedtime., Until Discontinued, Historical Med       Allergies  Allergen Reactions  . Procaine Anaphylaxis  . Ciprofloxacin Other (See Comments) and Nausea  And Vomiting    Malaise, Rigor  . Hydrocodone-Acetaminophen Itching    Watery eyes  . Omeprazole Other (See Comments)    GI reaction  . Penicillins Other (See Comments)  . Rabeprazole Other (See Comments)    GI reaction   Follow-up Information    Follow up with PCP. Schedule an appointment as soon as possible for a visit in 1 week.       The results of significant diagnostics from this hospitalization (including imaging, microbiology, ancillary and laboratory) are listed below for reference.    Significant Diagnostic Studies: Ct Cta Abd/pel W/cm &/or W/o Cm  03/20/2016  CLINICAL DATA:  Abdominal pain.  Known abdominal aortic aneurysm EXAM: CTA ABDOMEN AND PELVIS wITHOUT AND WITH CONTRAST TECHNIQUE: Multidetector CT imaging of the abdomen and pelvis was performed using the standard protocol during bolus administration of intravenous contrast. Multiplanar reconstructed images and MIPs were obtained and reviewed to evaluate the vascular anatomy. CONTRAST:  100 mL Isovue 370 intravenous COMPARISON:  None. FINDINGS: There is a 5.4 cm infrarenal abdominal aortic aneurysm. There is no aneurysm leak or retroperitoneal hemorrhage. The celiac axis and SMA are patent. The renal arteries are patent. IMA is patent. Both iliac arteries are patent, heavily calcified. Review of the MIP images confirms the above findings. Nonvascular findings in the abdomen and pelvis: There are normal appearances of the liver, gallbladder and bile ducts. The pancreas is normal. The spleen is normal. The adrenals and kidneys are normal in appearance. There is no urinary calculus evident. There is no hydronephrosis or ureteral dilatation. Collecting systems and ureters appear unremarkable. The stomach, small bowel and colon are remarkable only for mild uncomplicated colonic diverticulosis. The proximal sigmoid colon distends a short distance into a large left inguinal hernia, nonobstructing. No acute findings are evident in the  lower chest. Severe emphysematous disease and hyperinflation noted. No significant skeletal lesion is evident. Large inguinal hernias are present bilaterally, containing only fat on the right and containing fat as well as a knuckle of the proximal sigmoid colon on the left. IMPRESSION: 1. 5.4 cm infrarenal abdominal aortic aneurysm without leak or retroperitoneal hemorrhage. Vascular surgery consultation recommended due to increased risk of rupture for AAA >5.5 cm. This recommendation follows ACR consensus guidelines: White Paper of the ACR Incidental Findings Committee II on Vascular Findings. J Am Coll Radiol 2013; 10:789-794. 2. Extensive diverticulosis without evidence of diverticulitis or other acute inflammatory process. 3. Large inguinal hernias bilaterally, containing a knuckle of unobstructed sigmoid on the left. Electronically Signed   By: Andreas Newport M.D.   On: 03/20/2016 03:36    Microbiology: No results found for this or any previous visit (from the past 240 hour(s)).   Labs: Basic Metabolic Panel: No results for input(s): NA, K, CL, CO2, GLUCOSE, BUN, CREATININE, CALCIUM, MG, PHOS in the last 168 hours. Liver Function Tests: No results for input(s): AST, ALT, ALKPHOS, BILITOT, PROT, ALBUMIN in the last 168 hours. No results for input(s): LIPASE, AMYLASE in the last 168  hours. No results for input(s): AMMONIA in the last 168 hours. CBC: No results for input(s): WBC, NEUTROABS, HGB, HCT, MCV, PLT in the last 168 hours. Cardiac Enzymes: No results for input(s): CKTOTAL, CKMB, CKMBINDEX, TROPONINI in the last 168 hours. BNP: BNP (last 3 results) No results for input(s): BNP in the last 8760 hours.  ProBNP (last 3 results) No results for input(s): PROBNP in the last 8760 hours.  CBG: No results for input(s): GLUCAP in the last 168 hours.     SignedDomenic Polite MD.  [facility] 04/01/2016, 4:42 PM

## 2017-07-27 ENCOUNTER — Emergency Department: Payer: Non-veteran care

## 2017-07-27 ENCOUNTER — Encounter: Payer: Self-pay | Admitting: Emergency Medicine

## 2017-07-27 ENCOUNTER — Emergency Department
Admission: EM | Admit: 2017-07-27 | Discharge: 2017-07-27 | Disposition: A | Discharge status: Home / Self Care | Payer: Non-veteran care | Attending: Emergency Medicine | Admitting: Emergency Medicine

## 2017-07-27 DIAGNOSIS — Z96652 Presence of left artificial knee joint: Secondary | ICD-10-CM | POA: Insufficient documentation

## 2017-07-27 DIAGNOSIS — I10 Essential (primary) hypertension: Secondary | ICD-10-CM | POA: Diagnosis not present

## 2017-07-27 DIAGNOSIS — R002 Palpitations: Secondary | ICD-10-CM | POA: Insufficient documentation

## 2017-07-27 DIAGNOSIS — Z79899 Other long term (current) drug therapy: Secondary | ICD-10-CM | POA: Diagnosis not present

## 2017-07-27 LAB — BASIC METABOLIC PANEL
Anion gap: 9 (ref 5–15)
BUN: 11 mg/dL (ref 6–20)
CHLORIDE: 96 mmol/L — AB (ref 101–111)
CO2: 28 mmol/L (ref 22–32)
Calcium: 9.5 mg/dL (ref 8.9–10.3)
Creatinine, Ser: 0.79 mg/dL (ref 0.61–1.24)
GFR calc non Af Amer: >60 mL/min (ref >60)
Glucose, Bld: 104 mg/dL — ABNORMAL HIGH (ref 65–99)
POTASSIUM: 4.8 mmol/L (ref 3.5–5.1)
SODIUM: 133 mmol/L — AB (ref 135–145)

## 2017-07-27 LAB — CBC
HEMATOCRIT: 45.1 % (ref 40.0–52.0)
Hemoglobin: 15.2 g/dL (ref 13.0–18.0)
MCH: 32.9 pg (ref 26.0–34.0)
MCHC: 33.7 g/dL (ref 32.0–36.0)
MCV: 97.5 fL (ref 80.0–100.0)
Platelets: 206 10*3/uL (ref 150–440)
RBC: 4.62 MIL/uL (ref 4.40–5.90)
RDW: 12.7 % (ref 11.5–14.5)
WBC: 9 10*3/uL (ref 3.8–10.6)

## 2017-07-27 LAB — TROPONIN I: Troponin I: <0.03 ng/mL (ref <0.03)

## 2017-07-27 NOTE — ED Provider Notes (Signed)
[facility] Emergency Department Provider Note  ____________________________________________   First MD Initiated Contact with Patient 07/27/17 1713     (approximate)  I have reviewed the triage vital signs and the nursing notes.   HISTORY  Chief Complaint Palpitations    HPI Steven Elliott is a 83 y.o. male who is generally healthy and active in spite of his past medical history and age.  He has full VA benefits and has a primary provider with the [name] but does not have a cardiologist.  He presents today by private vehicle for evaluation of intermittent palpitations or feelings of "skipped beats" for about a week.  He states he was briefly dizzy yesterday but that past and has not continued and was only mild.  He feels like the skipped beats are becoming more frequent so he wanted to get checked out and make sure everything was okay.  He denies chest pain, chest pressure, shortness of breath, nausea, vomiting, abdominal pain, dysuria, and diarrhea.  Nothing in particular makes the symptoms better nor worse.  He has not had the symptoms in the past.  He has had no recent medication changes.  He has not had any recent illness.  Past Medical History:  Diagnosis Date  . 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   Marland Kitchen BPH (benign prostatic hyperplasia)   . Emphysema (subcutaneous) (surgical) resulting from a procedure    severe by CT 03/20/16  . History of bilateral inguinal hernias 02/2015  . Hypertension   . Peripheral vascular disease ([place]) 02/2015   heavily calcified but patent bil iliac arteries by CTA 03/20/16.    Patient Active Problem List   Diagnosis Date Noted  . Diverticular hemorrhage 03/22/2016  . Abdominal aortic aneurysm ([place]) 03/22/2016  . Rectal bleeding 03/20/2016    Past Surgical History:  Procedure Laterality Date  . APPENDECTOMY    . COLONOSCOPY  ~ 2007   at [facility]  . SKIN CANCER EXCISION    .  TOTAL KNEE ARTHROPLASTY Left 2014    Prior to Admission medications   Medication Sig Start Date End Date Taking? Authorizing Provider  albuterol (PROVENTIL HFA;VENTOLIN HFA) 108 (90 Base) MCG/ACT inhaler Inhale 2 puffs into the lungs every 6 (six) hours as needed for wheezing or shortness of breath.    [provider]  Calcium Carbonate-Vitamin D (CALCIUM-D) 600-400 MG-UNIT TABS Take 1 tablet by mouth 2 (two) times daily.    [provider]  cetirizine (ZYRTEC) 10 MG tablet Take 10 mg by mouth daily.    [provider]  fluticasone (FLONASE) 50 MCG/ACT nasal spray Place 1 spray into both nostrils daily.    [provider]  gabapentin (NEURONTIN) 100 MG capsule Take 200 mg by mouth at bedtime.    [provider]  GARLIC PO Take 1 capsule by mouth daily.    [provider]  hydrochlorothiazide (HYDRODIURIL) 25 MG tablet Take 12.5 mg by mouth daily.    [provider]  ipratropium (ATROVENT) 0.06 % nasal spray Place 2 sprays into both nostrils 2 (two) times daily.    [provider]  LACTOBACILLUS PO Take 2 tablets by mouth daily.    [provider]  Multiple Vitamin (MULTIVITAMIN WITH MINERALS) TABS tablet Take 1 tablet by mouth daily.    [provider]  oxyCODONE (OXY IR/ROXICODONE) 5 MG immediate release tablet Take 5 mg by mouth every 4 (four) hours as needed  for severe pain.    [provider]  senna-docusate (SENOKOT-S) 8.6-50 MG tablet Take 1 tablet by mouth 2 (two) times daily.    [provider]  tamsulosin (FLOMAX) 0.4 MG CAPS capsule Take 0.4 mg by mouth at bedtime.    [provider]    Allergies Procaine; Ciprofloxacin; Hydrocodone-acetaminophen; Omeprazole; Penicillins; and Rabeprazole  Family History  Problem Relation Age of Onset  . Breast cancer Mother   . Pancreatitis Mother     Social History Social History   Tobacco Use  . Smoking status: Never Smoker   Substance Use Topics  . Alcohol use: Yes    Comment: rare  . Drug use: No    Review of Systems Constitutional: No fever/chills Eyes: No visual changes. ENT: No sore throat. Cardiovascular: Denies chest pain.  Gradually worsening sensation of intermittent skipped beats or palpitations Respiratory: Denies shortness of breath. Gastrointestinal: No abdominal pain.  No nausea, no vomiting.  No diarrhea.  No constipation. Genitourinary: Negative for dysuria. Musculoskeletal: Negative for neck pain.  Negative for back pain. Integumentary: Negative for rash. Neurological: Negative for headaches, focal weakness or numbness.   ____________________________________________   PHYSICAL EXAM:  VITAL SIGNS: ED Triage Vitals  Enc Vitals Group     BP 07/27/17 1419 (!) 165/60     Pulse Rate 07/27/17 1419 72     Resp 07/27/17 1419 16     Temp --      Temp src --      SpO2 07/27/17 1419 98 %     Weight 07/27/17 1415 76.7 kg (169 lb)     Height 07/27/17 1415 1.702 m (5\' 7" )     Head Circumference --      Peak Flow --      Pain Score --      Pain Loc --      Pain Edu? --      Excl. in Accident? --     Constitutional: Alert and oriented. Well appearing and in no acute distress.  Appears younger than chronological age. Eyes: Conjunctivae are normal.  Head: Atraumatic. Nose: No congestion/rhinnorhea. Mouth/Throat: Mucous membranes are moist. Neck: No stridor.  No meningeal signs.   Cardiovascular: Normal rate, regular rhythm except for intermittent PVCs. Good peripheral circulation. Grossly normal heart sounds.  Respiratory: Normal respiratory effort.  No retractions. Lungs CTAB. Gastrointestinal: Soft and nontender. No distention.  Musculoskeletal: No lower extremity tenderness nor edema. No gross deformities of extremities. Neurologic:  Normal speech and language. No gross focal neurologic deficits are appreciated.  Skin:  Skin is warm, dry and intact. No rash noted. Psychiatric: Mood and  affect are normal. Speech and behavior are normal.  ____________________________________________   LABS (all labs ordered are listed, but only abnormal results are displayed)  Labs Reviewed  BASIC METABOLIC PANEL - Abnormal; Notable for the following components:      Result Value   Sodium 133 (*)    Chloride 96 (*)    Glucose, Bld 104 (*)    All other components within normal limits  CBC  TROPONIN I   ____________________________________________  EKG  ED ECG REPORT I, Prerana Strayer, the attending physician, personally viewed and interpreted this ECG.  Date: 07/27/2017 EKG Time: 14: 13 Rate: 70 Rhythm: normal sinus rhythm with occasional PVC QRS Axis: normal Intervals: Right bundle branch block ST/T Wave abnormalities: normal Narrative Interpretation: no evidence of acute ischemia  ____________________________________________  RADIOLOGY   Dg Chest 2 View  Result Date: 07/27/2017 CLINICAL DATA:  Cardiac palpitations and dizziness Hypertension EXAM: CHEST  2 VIEW COMPARISON:  April 07, 2012. FINDINGS: There are foci of scarring in the mid and lower lung zones bilaterally. There is no evident edema or consolidation. Heart size and pulmonary vascularity are normal. No adenopathy. There is aortic atherosclerosis. There is degenerative change in the thoracic spine. IMPRESSION: Areas of lung scarring bilaterally, no edema or consolidation. Heart size within normal limits. There is aortic atherosclerosis. Dx Electronically Signed   By: Lowella Grip III M.D.   On: 07/27/2017 14:41    ____________________________________________   PROCEDURES  Critical Care performed: No   Procedure(s) performed:   Procedures   ____________________________________________   INITIAL IMPRESSION / ASSESSMENT AND PLAN / ED COURSE  As part of my medical decision making, I reviewed the following data within the [place] History obtained from family, Nursing notes  reviewed and incorporated, Labs reviewed , EKG interpreted  and Radiograph reviewed     Differential diagnosis includes, but is not limited to, PVCs, electrolyte abnormalities, SVT, A. fib, heart block, ACS, PE, infection, etc.  However the patient is very well-appearing, has a reassuring EKG other than occasional PVC, labs are reassuring except for a very slightly decreased sodium.  Vital signs are normal and stable.  The patient is well-appearing and in no acute distress.  I had a long talk with him about benign palpitations and PVCs and he is very comfortable with the plan to follow-up with his outpatient doctor.  I am also providing him the name and number of a local cardiologist even though his insurance may not allow him to see them in clinic; I encouraged him to give them a call and see if any arrangements could be made.  I gave my usual customary return precautions and he understands and agrees with plan.     ____________________________________________  FINAL CLINICAL IMPRESSION(S) / ED DIAGNOSES  Final diagnoses:  Palpitations     MEDICATIONS GIVEN DURING THIS VISIT:  Medications - No data to display     Note:  This document was prepared using Dragon voice recognition software and may include unintentional dictation errors.    Hinda Kehr, MD 07/27/17 [id number]

## 2017-07-27 NOTE — ED Triage Notes (Signed)
Patient presents to ED via POV from home with c/o palpitations x 1 week. Patient came in today because it is getting more frequent. Patient denies N/V. Patient states he had one episode of dizziness yesterday.

## 2017-07-27 NOTE — Discharge Instructions (Signed)
As we discussed, your workup today was reassuring.  Though we do not know exactly what is causing your symptoms, it appears that you have no emergent medical condition at this time and that you are safe to go home and follow up as recommended in this paperwork. ° °Please return immediately to the Emergency Department if you develop any new or worsening symptoms that concern you. ° °

## 2020-05-16 ENCOUNTER — Ambulatory Visit (INDEPENDENT_AMBULATORY_CARE_PROVIDER_SITE_OTHER)
Admission: RE | Admit: 2020-05-16 | Discharge: 2020-05-16 | Disposition: A | Discharge status: Home / Self Care | Payer: No Typology Code available for payment source | Source: Ambulatory Visit | Attending: Physician Assistant | Admitting: Physician Assistant

## 2020-05-16 ENCOUNTER — Ambulatory Visit (INDEPENDENT_AMBULATORY_CARE_PROVIDER_SITE_OTHER): Payer: No Typology Code available for payment source | Admitting: Physician Assistant

## 2020-05-16 ENCOUNTER — Other Ambulatory Visit: Payer: Self-pay

## 2020-05-16 ENCOUNTER — Encounter: Payer: Self-pay | Admitting: Physician Assistant

## 2020-05-16 VITALS — BP 132/60 | HR 80 | Ht 68.0 in | Wt 168.0 lb

## 2020-05-16 DIAGNOSIS — K219 Gastro-esophageal reflux disease without esophagitis: Secondary | ICD-10-CM

## 2020-05-16 DIAGNOSIS — K5900 Constipation, unspecified: Secondary | ICD-10-CM

## 2020-05-16 DIAGNOSIS — R140 Abdominal distension (gaseous): Secondary | ICD-10-CM

## 2020-05-16 NOTE — Patient Instructions (Addendum)
Your provider has requested that you have an abdominal x ray before leaving today. Please go to the basement floor to our Radiology department for the test.  Please purchase the following medications over the counter and take as directed: Miralax 17 grams (1 capful) dissolved in at least 8 ounces water/juice once daily.  Please follow up with Ellouise Newer, PA-C on 07/02/20 at 10:30 am.  If you are age 84 or older, your body mass index should be between 23-30. Your Body mass index is 25.54 kg/m. If this is out of the aforementioned range listed, please consider follow up with your Primary Care Provider.  If you are age 81 or younger, your body mass index should be between 19-25. Your Body mass index is 25.54 kg/m. If this is out of the aformentioned range listed, please consider follow up with your Primary Care Provider.   Due to recent changes in healthcare laws, you may see the results of your imaging and laboratory studies on MyChart before your provider has had a chance to review them.  We understand that in some cases there may be results that are confusing or concerning to you. Not all laboratory results come back in the same time frame and the provider may be waiting for multiple results in order to interpret others.  Please give [place] 48 hours in order for your provider to thoroughly review all the results before contacting the office for clarification of your results.

## 2020-05-16 NOTE — Progress Notes (Signed)
Attending Physician's Attestation   I have reviewed the chart.   I agree with the Advanced Practitioner's note, impression, and recommendations with any updates as below. Evaluation of the patient's recent reported endoscopy will be helpful.  Agree with plan otherwise and potential role of endoscopic evaluation based on follow-up.   Justice Britain, MD [facility] Advanced Endoscopy Office # [phone]

## 2020-05-16 NOTE — Progress Notes (Signed)
Chief Complaint: Bloating, GERD, constipation  HPI:    Steven Elliott is an 86 year old [place] male with a past medical history as listed below including COPD, reflux, emphysema and PVD, who presents to clinic today as a referral from the [name] for bloating, GERD, constipation.    02/26/2020 patient seen at the [facility] with bloating and some vomiting.  Currently taking lansoprazole 15 mg once daily.  They discussed refractory GERD/vomiting and constipation.  Apparently had an EGD with normal biopsies.    Today, the patient presents to clinic accompanied by his daughter who provides most of his history.  He explains that in September 2020 he had his gallbladder out and feels like all of the symptoms came on afterwards.  Tells me that he has been constipated for most of his life though and will only have a bowel movement once every 3 to 4 days.  He does not try using anything for this.  Then he will occasionally have diarrhea.  His daughter tells me that anytime he eats anything he will bloat up like he is pregnant and often has to unzip his pants "even in the middle of the day".  She tells me he "complains about this all the time".  Patient tells me he drinks a lot of water as well as a lot of "watered down coffee".    Reflux is very infrequent and does not bother him at all.  Once in a while he will take a Tums which helps.    Asked patient about EGD, he thinks he had one within the past year.  We do not have report.  Also asked about colonoscopy which he thinks he had done a long time ago.    Denies fever, chills, blood in his stool, weight loss or symptoms that awaken him from sleep.  Past Medical History:  Diagnosis Date  . Allergic rhinitis   . 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   Marland Kitchen Anosmia   . BPH (benign prostatic hyperplasia)   . COPD (chronic obstructive pulmonary disease) ([place])   . Diverticulosis   . Emphysema (subcutaneous) (surgical) resulting from a  procedure    severe by CT 03/20/16  . GERD (gastroesophageal reflux disease)   . History of bilateral inguinal hernias 02/2015  . Hypertension   . Peripheral vascular disease ([place]) 02/2015   heavily calcified but patent bil iliac arteries by CTA 03/20/16.    Past Surgical History:  Procedure Laterality Date  . APPENDECTOMY    . CHOLECYSTECTOMY    . COLONOSCOPY  ~ 2007   at [facility]  . SKIN CANCER EXCISION    . TOTAL KNEE ARTHROPLASTY Left 2014    Current Outpatient Medications  Medication Sig Dispense Refill  . albuterol (PROVENTIL HFA;VENTOLIN HFA) 108 (90 Base) MCG/ACT inhaler Inhale 2 puffs into the lungs every 6 (six) hours as needed for wheezing or shortness of breath.    . Calcium Carbonate-Vitamin D (CALCIUM-D) 600-400 MG-UNIT TABS Take 1 tablet by mouth 2 (two) times daily.    . cetirizine (ZYRTEC) 10 MG tablet Take 10 mg by mouth daily.    . fluticasone (FLONASE) 50 MCG/ACT nasal spray Place 1 spray into both nostrils daily.    Marland Kitchen gabapentin (NEURONTIN) 100 MG capsule Take 200 mg by mouth at bedtime.    Marland Kitchen GARLIC PO Take 1 capsule by mouth daily.    . hydrochlorothiazide (HYDRODIURIL) 25 MG tablet Take 12.5 mg by mouth daily.    Marland Kitchen  ipratropium (ATROVENT) 0.06 % nasal spray Place 2 sprays into both nostrils 2 (two) times daily.    Marland Kitchen LACTOBACILLUS PO Take 2 tablets by mouth daily.    . Multiple Vitamin (MULTIVITAMIN WITH MINERALS) TABS tablet Take 1 tablet by mouth daily.    Marland Kitchen oxyCODONE (OXY IR/ROXICODONE) 5 MG immediate release tablet Take 5 mg by mouth every 4 (four) hours as needed for severe pain.    Marland Kitchen senna-docusate (SENOKOT-S) 8.6-50 MG tablet Take 1 tablet by mouth 2 (two) times daily.    . tamsulosin (FLOMAX) 0.4 MG CAPS capsule Take 0.4 mg by mouth at bedtime.     No current facility-administered medications for this visit.    Allergies as of 05/16/2020 - Review Complete 07/27/2017  Allergen Reaction Noted  . Procaine Anaphylaxis 03/20/2016  . Ciprofloxacin Other  (See Comments) and Nausea And Vomiting 03/20/2016  . Hydrocodone-acetaminophen Itching 03/20/2016  . Omeprazole Other (See Comments) 03/20/2016  . Penicillins Other (See Comments) 03/20/2016  . Rabeprazole Other (See Comments) 03/20/2016    Family History  Problem Relation Age of Onset  . Breast cancer Mother   . Pancreatitis Mother   . Breast cancer Sister     Social History   Socioeconomic History  . Marital status: Married    Spouse name: Not on file  . Number of children: Not on file  . Years of education: Not on file  . Highest education level: Not on file  Occupational History  . Occupation: Retired  Tobacco Use  . Smoking status: Never Smoker  . Smokeless tobacco: Never Used  Substance and Sexual Activity  . Alcohol use: Yes    Comment: rare  . Drug use: No  . Sexual activity: Not on file  Other Topics Concern  . Not on file  Social History Narrative  . Not on file   Social Determinants of Health   Financial Resource Strain:   . Difficulty of Paying Living Expenses: Not on file  Food Insecurity:   . Worried About Charity fundraiser in the Last Year: Not on file  . Ran Out of Food in the Last Year: Not on file  Transportation Needs:   . Lack of Transportation (Medical): Not on file  . Lack of Transportation (Non-Medical): Not on file  Physical Activity:   . Days of Exercise per Week: Not on file  . Minutes of Exercise per Session: Not on file  Stress:   . Feeling of Stress : Not on file  Social Connections:   . Frequency of Communication with Friends and Family: Not on file  . Frequency of Social Gatherings with Friends and Family: Not on file  . Attends Religious Services: Not on file  . Active Member of Clubs or Organizations: Not on file  . Attends Archivist Meetings: Not on file  . Marital Status: Not on file  Intimate Partner Violence:   . Fear of Current or Ex-Partner: Not on file  . Emotionally Abused: Not on file  . Physically  Abused: Not on file  . Sexually Abused: Not on file    Review of Systems:    Constitutional: No weight loss, fever or chills Skin: No rash  Cardiovascular: No chest pain Respiratory: No SOB  Gastrointestinal: See HPI and otherwise negative Genitourinary: No dysuria  Neurological: No headache, dizziness or syncope Musculoskeletal: No new muscle or joint pain Hematologic: No bleeding  Psychiatric: No history of depression or anxiety   Physical Exam:  Vital signs: BP  132/60   Pulse 80   Ht 5\' 8"  (1.727 m)   Wt 168 lb (76.2 kg)   BMI 25.54 kg/m   Constitutional:   Pleasant Elderly Caucasian male appears to be in NAD, Well developed, Well nourished, alert and cooperative Head:  Normocephalic and atraumatic. Eyes:   PEERL, EOMI. No icterus. Conjunctiva pink. Ears:  Normal auditory acuity. Neck:  Supple Throat: Oral cavity and pharynx without inflammation, swelling or lesion.  Respiratory: Respirations even and unlabored. Lungs clear to auscultation bilaterally.   No wheezes, crackles, or rhonchi.  Cardiovascular: Normal S1, S2. No MRG. Regular rate and rhythm. No peripheral edema, cyanosis or pallor.  Gastrointestinal:  Soft, moderate distension, nontender. No rebound or guarding. Increased BS all four quadrants. No appreciable masses or hepatomegaly. Rectal:  Not performed.  Msk:  Symmetrical without gross deformities. Without edema, no deformity or joint abnormality. +kyphosis Neurologic:  Alert and  oriented x4;  grossly normal neurologically.  Skin:   Dry and intact without significant lesions or rashes. Psychiatric: Demonstrates good judgement and reason without abnormal affect or behaviors.  Requesting recent labs.  Assessment: 1.  Constipation: 1 bowel movement every 3 to 4 days, chronic per the patient 2.  Bloating: Likely with above versus SIBO versus other 3.  GERD: Patient tells me this does not bother him when he uses occasional Tums  Plan: 1.  Requesting recent  labs and EGD from [name]. 2.  Ordered an abdominal x-ray 1 view today. 3.  Encourage the patient to use MiraLAX twice daily.  Explained titration of this up to 4 times a day for 1 soft solid stool per day. 4.  Pending results from x-ray and results after MiraLAX could consider treatment for SIBO or Gas-X versus further work-up with colonoscopy, CT etc. 5.  Patient to follow in clinic with me in 4-6 weeks.  Patient assigned to Dr. Rush Landmark today.  Ellouise Newer, PA-C [facility] 05/16/2020, 9:01 AM

## 2020-07-02 ENCOUNTER — Ambulatory Visit: Payer: Non-veteran care | Admitting: Physician Assistant

## 2020-09-13 ENCOUNTER — Other Ambulatory Visit: Payer: Self-pay

## 2020-09-13 ENCOUNTER — Ambulatory Visit (INDEPENDENT_AMBULATORY_CARE_PROVIDER_SITE_OTHER): Payer: No Typology Code available for payment source

## 2020-09-13 ENCOUNTER — Encounter (HOSPITAL_COMMUNITY): Payer: Self-pay | Admitting: *Deleted

## 2020-09-13 ENCOUNTER — Ambulatory Visit (HOSPITAL_COMMUNITY)
Admission: EM | Admit: 2020-09-13 | Discharge: 2020-09-13 | Disposition: A | Discharge status: Home / Self Care | Payer: No Typology Code available for payment source | Attending: Urgent Care | Admitting: Urgent Care

## 2020-09-13 DIAGNOSIS — Z20822 Contact with and (suspected) exposure to covid-19: Secondary | ICD-10-CM | POA: Insufficient documentation

## 2020-09-13 DIAGNOSIS — R059 Cough, unspecified: Secondary | ICD-10-CM

## 2020-09-13 DIAGNOSIS — J449 Chronic obstructive pulmonary disease, unspecified: Secondary | ICD-10-CM | POA: Insufficient documentation

## 2020-09-13 DIAGNOSIS — R0981 Nasal congestion: Secondary | ICD-10-CM | POA: Diagnosis not present

## 2020-09-13 DIAGNOSIS — J438 Other emphysema: Secondary | ICD-10-CM | POA: Insufficient documentation

## 2020-09-13 LAB — SARS CORONAVIRUS 2 (TAT 6-24 HRS): SARS Coronavirus 2: NEGATIVE

## 2020-09-13 MED ORDER — CETIRIZINE HCL 5 MG PO TABS: 5.0000 mg | ORAL_TABLET | Freq: Every day | ORAL | 0 refills | Status: DC

## 2020-09-13 NOTE — ED Triage Notes (Signed)
C/O nasal congestion and cough x 2 days without any known fevers.  Denies any dyspnea.

## 2020-09-13 NOTE — ED Provider Notes (Signed)
[place]   MRN: [id number] DOB: 08/03/1930  Subjective:   Steven Elliott is a 86 y.o. male presenting for 3-day history of acute onset sinus drainage, coughing.  Patient has a history of COPD, has not needed to use his inhaler.  Denies fever, chest pain, shortness of breath.  He is Covid vaccinated.  No current facility-administered medications for this encounter.  Current Outpatient Medications:  .  Calcium Carbonate-Vitamin D (CALCIUM-D) 600-400 MG-UNIT TABS, Take 1 tablet by mouth 2 (two) times daily., Disp: , Rfl:  .  cetirizine (ZYRTEC) 10 MG tablet, Take 10 mg by mouth daily., Disp: , Rfl:  .  fluticasone (FLONASE) 50 MCG/ACT nasal spray, Place 1 spray into both nostrils daily., Disp: , Rfl:  .  hydrochlorothiazide (HYDRODIURIL) 25 MG tablet, Take 12.5 mg by mouth daily., Disp: , Rfl:  .  ipratropium (ATROVENT) 0.06 % nasal spray, Place 2 sprays into both nostrils 2 (two) times daily., Disp: , Rfl:  .  Multiple Vitamin (MULTIVITAMIN WITH MINERALS) TABS tablet, Take 1 tablet by mouth daily., Disp: , Rfl:  .  UNKNOWN TO PATIENT, Omeprazole or pantoprazole, Disp: , Rfl:  .  UNKNOWN TO PATIENT, "2 memory pills", Disp: , Rfl:  .  albuterol (PROVENTIL HFA;VENTOLIN HFA) 108 (90 Base) MCG/ACT inhaler, Inhale 2 puffs into the lungs every 6 (six) hours as needed for wheezing or shortness of breath., Disp: , Rfl:  .  gabapentin (NEURONTIN) 100 MG capsule, Take 200 mg by mouth at bedtime., Disp: , Rfl:  .  GARLIC PO, Take 1 capsule by mouth daily., Disp: , Rfl:  .  LACTOBACILLUS PO, Take 2 tablets by mouth daily., Disp: , Rfl:  .  oxyCODONE (OXY IR/ROXICODONE) 5 MG immediate release tablet, Take 5 mg by mouth every 4 (four) hours as needed for severe pain., Disp: , Rfl:  .  senna-docusate (SENOKOT-S) 8.6-50 MG tablet, Take 1 tablet by mouth 2 (two) times daily., Disp: , Rfl:  .  SUCRALFATE PO, Take by mouth., Disp: , Rfl:  .  tamsulosin (FLOMAX) 0.4 MG CAPS capsule,  Take 0.4 mg by mouth at bedtime., Disp: , Rfl:    Allergies  Allergen Reactions  . Procaine Anaphylaxis  . Ciprofloxacin Other (See Comments) and Nausea And Vomiting    Malaise, Rigor  . Hydrocodone-Acetaminophen Itching    Watery eyes  . Omeprazole Other (See Comments)    GI reaction  . Penicillins Other (See Comments)  . Rabeprazole Other (See Comments)    GI reaction    Past Medical History:  Diagnosis Date  . Allergic rhinitis   . 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   [place] Anosmia   . BPH (benign prostatic hyperplasia)   . COPD (chronic obstructive pulmonary disease) ([place])   . Diverticulosis   . Emphysema (subcutaneous) (surgical) resulting from a procedure    severe by CT 03/20/16  . GERD (gastroesophageal reflux disease)   . History of bilateral inguinal hernias 02/2015  . Hypertension   . Peripheral vascular disease ([place]) 02/2015   heavily calcified but patent bil iliac arteries by CTA 03/20/16.     Past Surgical History:  Procedure Laterality Date  . APPENDECTOMY    . CHOLECYSTECTOMY    . COLONOSCOPY  ~ 2007   at [facility]  . SKIN CANCER EXCISION    . TOTAL KNEE ARTHROPLASTY Left 2014    Family History  Problem Relation Age of Onset  .  Breast cancer Mother   . Pancreatitis Mother   . Breast cancer Sister     Social History   Tobacco Use  . Smoking status: Never Smoker  . Smokeless tobacco: Never Used  Substance Use Topics  . Alcohol use: Yes    Comment: rare  . Drug use: No    ROS   Objective:   Vitals: BP (!) 160/91   Pulse 78   Temp (!) 97.3 F (36.3 C) (Oral)   Resp (!) 22   SpO2 94%   Physical Exam Constitutional:      General: He is not in acute distress.    Appearance: Normal appearance. He is well-developed. He is not ill-appearing, toxic-appearing or diaphoretic.  HENT:     Head: Normocephalic and atraumatic.     Right Ear: External ear normal.     Left Ear: External ear normal.      Nose: Nose normal.     Mouth/Throat:     Mouth: Mucous membranes are moist.     Pharynx: Oropharynx is clear.  Eyes:     General: No scleral icterus.    Extraocular Movements: Extraocular movements intact.     Pupils: Pupils are equal, round, and reactive to light.  Cardiovascular:     Rate and Rhythm: Normal rate and regular rhythm.     Heart sounds: Normal heart sounds. No murmur heard. No friction rub. No gallop.   Pulmonary:     Effort: Pulmonary effort is normal. No respiratory distress.     Breath sounds: Normal breath sounds. No stridor. No wheezing, rhonchi or rales.     Comments: Barrel chested. Neurological:     Mental Status: He is alert and oriented to person, place, and time.  Psychiatric:        Mood and Affect: Mood normal.        Behavior: Behavior normal.        Thought Content: Thought content normal.     DG Chest 2 View  Result Date: 09/13/2020 CLINICAL DATA:  Cough, nasal congestion, symptoms for 2 days, no fever EXAM: CHEST - 2 VIEW COMPARISON:  07/27/2017 FINDINGS: Normal heart size, mediastinal contours, and pulmonary vascularity. Atherosclerotic calcification aorta. Emphysematous and bronchitic changes consistent with COPD. Chronic interstitial changes in the mid to lower lungs, stable. No acute infiltrate, pleural effusion, or pneumothorax. Bones diffusely demineralized. IMPRESSION: Changes of COPD and chronic basilar interstitial lung disease. No acute abnormalities. Aortic Atherosclerosis (ICD10-I70.0) and Emphysema (ICD10-J43.9). Electronically Signed   By: Lavonia Dana M.D.   On: 09/13/2020 16:22     Assessment and Plan :   PDMP not reviewed this encounter.  1. Nasal congestion   2. Cough   3. Chronic obstructive pulmonary disease, unspecified COPD type ([place])   4. Other emphysema ([place])     Will manage for viral illness such as viral URI, viral syndrome, viral rhinitis, COVID-19. Counseled patient on nature of COVID-19 including modes of  transmission, diagnostic testing, management and supportive care.  Offered scripts for symptomatic relief. COVID 19 testing is pending. Counseled patient on potential for adverse effects with medications prescribed/recommended today, ER and return-to-clinic precautions discussed, patient verbalized understanding.     Jaynee Eagles, PA-C 09/13/20 1625

## 2021-07-08 ENCOUNTER — Encounter: Payer: Self-pay | Admitting: Student in an Organized Health Care Education/Training Program

## 2021-07-08 ENCOUNTER — Other Ambulatory Visit: Payer: Self-pay

## 2021-07-08 ENCOUNTER — Ambulatory Visit
Payer: No Typology Code available for payment source | Attending: Student in an Organized Health Care Education/Training Program | Admitting: Student in an Organized Health Care Education/Training Program

## 2021-07-08 VITALS — BP 134/57 | HR 79 | Temp 97.2°F | Resp 18 | Ht 68.0 in | Wt 171.7 lb

## 2021-07-08 DIAGNOSIS — M47816 Spondylosis without myelopathy or radiculopathy, lumbar region: Secondary | ICD-10-CM | POA: Diagnosis present

## 2021-07-08 NOTE — Progress Notes (Signed)
Safety precautions to be maintained throughout the outpatient stay will include: orient to surroundings, keep bed in low position, maintain call bell within reach at all times, provide assistance with transfer out of bed and ambulation.  

## 2021-07-08 NOTE — Progress Notes (Signed)
Patient: Steven Elliott  Service Category: E/M  Provider: Gillis Santa, MD  DOB: 1929-12-13  DOS: 07/08/2021  Referring Provider: No ref. provider found  MRN: [id number]  Setting: Ambulatory outpatient  PCP: Pcp, No  Type: New Patient  Specialty: Interventional Pain Management    Location: Office  Delivery: Face-to-face     Primary Reason(s) for Visit: Encounter for initial evaluation of one or more chronic problems (new to examiner) potentially causing chronic pain, and posing a threat to normal musculoskeletal function. (Level of risk: High) CC: Back Pain  HPI  Steven Elliott is a 87 y.o. year old, male patient, who comes for the first time to our practice referred by No ref. provider found for our initial evaluation of his chronic pain. He has Rectal bleeding; Diverticular hemorrhage; and Abdominal aortic aneurysm on their problem list. Today he comes in for evaluation of his Back Pain  Pain Assessment: Location: Lower Back Radiating: down left leg to left foot; also c/o pain mid to upper back Onset: More than a month ago Duration: Chronic pain Quality: Dull Severity: 7 /10 (subjective, self-reported pain score)  Effect on ADL: limits daily activities; "sometimes leg pain interruprts sleep" Timing: Constant Modifying factors: massage, acupuncture BP: (!) 134/57  HR: 79  Onset and Duration: Gradual and Present longer than 3 months Cause of pain: Unknown Severity: Getting worse, NAS-11 at its worse: 8/10, NAS-11 at its best: 6/10, NAS-11 now: 7/10, and NAS-11 on the average: 7/10 Timing: Not influenced by the time of the day Aggravating Factors: Bending, Climbing, Kneeling, Lifiting, Motion, Prolonged sitting, Prolonged standing, Squatting, Stooping , Twisting, Walking, Walking uphill, Walking downhill, and Working Alleviating Factors: Acupuncture, Lying down, and Resting Associated Problems: Constipation, Day-time cramps, Night-time cramps, Fatigue, Inability to concentrate,  Tingling, and Weakness Quality of Pain: Aching, Annoying, Intermittent, Distressing, Dull, Exhausting, Nagging, Tingling, Tiring, and Uncomfortable Previous Examinations or Tests: CT scan, Endoscopy, MRI scan, and X-rays Previous Treatments: Physical Therapy and TENS   Chronic low back pain that is focal to his L4 and L5 lumbar region.  Pain with facet loading.  No inciting or traumatic event.  Does find benefit with massage and acupuncture.  Wants to avoid medications as he is not a fan of them.  He has done physical therapy in the past with limited response.  He is accompanied today by his daughter.  Meds   Current Outpatient Medications:    acetaminophen (TYLENOL) 500 MG tablet, Take 500 mg by mouth every 6 (six) hours as needed., Disp: , Rfl:    albuterol (PROVENTIL HFA;VENTOLIN HFA) 108 (90 Base) MCG/ACT inhaler, Inhale 2 puffs into the lungs every 6 (six) hours as needed for wheezing or shortness of breath., Disp: , Rfl:    Calcium Carbonate-Vitamin D (CALCIUM-D) 600-400 MG-UNIT TABS, Take 1 tablet by mouth 2 (two) times daily., Disp: , Rfl:    cetirizine (ZYRTEC) 5 MG tablet, Take 1 tablet (5 mg total) by mouth daily., Disp: 30 tablet, Rfl: 0   cyclobenzaprine (FLEXERIL) 5 MG tablet, Take 5 mg by mouth 3 (three) times daily as needed for muscle spasms., Disp: , Rfl:    fluticasone (FLONASE) 50 MCG/ACT nasal spray, Place 1 spray into both nostrils daily., Disp: , Rfl:    gabapentin (NEURONTIN) 100 MG capsule, Take 200 mg by mouth at bedtime., Disp: , Rfl:    hydrochlorothiazide (HYDRODIURIL) 25 MG tablet, Take 12.5 mg by mouth daily., Disp: , Rfl:    ipratropium (ATROVENT) 0.06 % nasal spray, Place 2  sprays into both nostrils 2 (two) times daily., Disp: , Rfl:    Multiple Vitamin (MULTIVITAMIN WITH MINERALS) TABS tablet, Take 1 tablet by mouth daily., Disp: , Rfl:    senna-docusate (SENOKOT-S) 8.6-50 MG tablet, Take 1 tablet by mouth 2 (two) times daily., Disp: , Rfl:    tamsulosin  (FLOMAX) 0.4 MG CAPS capsule, Take 0.4 mg by mouth at bedtime., Disp: , Rfl:    UNKNOWN TO PATIENT, "2 memory pills", Disp: , Rfl:   Imaging Review  MRI LUMBAR SPINE WITHOUT CONTRAST   INDICATION: Other intervertebral disc degeneration, lumbar region.   Additional information: Low back pain. Occasional leg pain with numbness/tingling.   TECHNIQUE: Multiplanar, multisequence MR imaging of the lumbar spine without IV contrast.   COMPARISON: None.   FINDINGS: For the purposes of this dictation, it is assumed that the most caudal lumbar-type vertebra is L5, and that the most caudal full intervertebral disc is L5-S1.   Mild multilevel degenerative disc disease (DDD) and facet arthropathy (see below).   Normal vertebral body heights.   The marrow signal pattern is within normal limits, allowing for mild discogenic marrow signal changes.   The conus medullaris terminates at a normal level, and is normal in size, contour, and signal intensity.   Abdominal aortic aneurysm (AAA), approximately 4 cm AP diameter. Question stent graft within it.   Individual disc levels:   T12-L1 (sagittal images only): No significant spinal canal or neural foraminal stenosis.   L1-2 (sagittal images only): No significant spinal canal or neural foraminal stenosis.   L2-3: No significant spinal canal or neural foraminal stenosis.   L3-4: No significant spinal canal or neural foraminal stenosis.   L4-5: Slight anterolisthesis of L4 on L5. No significant central zone stenosis. Mild bilateral subarticular zone stenosis. Mild bilateral neural foraminal stenosis.   L5-S1: No significant spinal canal or neural foraminal stenosis. Procedure Note  Holder, Mali A, MD - 03/10/2017  Formatting of this note might be different from the original.  MRI LUMBAR SPINE WITHOUT CONTRAST   INDICATION: Other intervertebral disc degeneration, lumbar region.   Additional information: Low back pain. Occasional leg pain with  numbness/tingling.   TECHNIQUE: Multiplanar, multisequence MR imaging of the lumbar spine without IV contrast.   COMPARISON: None.   FINDINGS: For the purposes of this dictation, it is assumed that the most caudal lumbar-type vertebra is L5, and that the most caudal full intervertebral disc is L5-S1.   Mild multilevel degenerative disc disease (DDD) and facet arthropathy (see below).   Normal vertebral body heights.   The marrow signal pattern is within normal limits, allowing for mild discogenic marrow signal changes.   The conus medullaris terminates at a normal level, and is normal in size, contour, and signal intensity.   Abdominal aortic aneurysm (AAA), approximately 4 cm AP diameter. Question stent graft within it.   Individual disc levels:   T12-L1 (sagittal images only): No significant spinal canal or neural foraminal stenosis.   L1-2 (sagittal images only): No significant spinal canal or neural foraminal stenosis.   L2-3: No significant spinal canal or neural foraminal stenosis.   L3-4: No significant spinal canal or neural foraminal stenosis.   L4-5: Slight anterolisthesis of L4 on L5. No significant central zone stenosis. Mild bilateral subarticular zone stenosis. Mild bilateral neural foraminal stenosis.   L5-S1: No significant spinal canal or neural foraminal stenosis.    IMPRESSION:   1. Mild degenerative spondylosis. No high-grade spinal canal or neural foraminal stenosis.   2.  AAA, approximately 4 cm AP diameter. Question stent graft within it.  Complexity Note: Imaging results reviewed. Results shared with Mr. Fambro, using Layman's terms.                         ROS  Cardiovascular: High blood pressure Pulmonary or Respiratory: Difficulty blowing air out (Emphysema), Shortness of breath, and Snoring  Neurological: Curved spine Psychological-Psychiatric: Difficulty sleeping and or falling asleep Gastrointestinal: Heartburn due to stomach pushing into  lungs (Hiatal hernia), Reflux or heatburn, and Irregular, infrequent bowel movements (Constipation) Genitourinary: No reported renal or genitourinary signs or symptoms such as difficulty voiding or producing urine, peeing blood, non-functioning kidney, kidney stones, difficulty emptying the bladder, difficulty controlling the flow of urine, or chronic kidney disease Hematological: Brusing easily Endocrine: No reported endocrine signs or symptoms such as high or low blood sugar, rapid heart rate due to high thyroid levels, obesity or weight gain due to slow thyroid or thyroid disease Rheumatologic: No reported rheumatological signs and symptoms such as fatigue, joint pain, tenderness, swelling, redness, heat, stiffness, decreased range of motion, with or without associated rash Musculoskeletal: Negative for myasthenia gravis, muscular dystrophy, multiple sclerosis or malignant hyperthermia Work History: Disabled  Allergies  Mr. Sedivy is allergic to procaine, ciprofloxacin, hydrocodone-acetaminophen, omeprazole, penicillins, and rabeprazole.  Laboratory Chemistry Profile   Renal Lab Results  Component Value Date   BUN 11 07/27/2017   CREATININE 0.79 07/27/2017   GFRAA >60 07/27/2017   GFRNONAA >60 07/27/2017   PROTEINUR Negative 04/07/2012     Electrolytes Lab Results  Component Value Date   NA 133 (L) 07/27/2017   K 4.8 07/27/2017   CL 96 (L) 07/27/2017   CALCIUM 9.5 07/27/2017     Hepatic Lab Results  Component Value Date   AST 41 03/20/2016   ALT 22 03/20/2016   ALBUMIN 3.8 03/20/2016   ALKPHOS 62 03/20/2016     ID Lab Results  Component Value Date   SARSCOV2NAA NEGATIVE 09/13/2020     Bone No results found for: [place], VD125OH2TOT, [id number], OL0786LJ4, 25OHVITD1, 25OHVITD2, 25OHVITD3, TESTOFREE, TESTOSTERONE   Endocrine Lab Results  Component Value Date   GLUCOSE 104 (H) 07/27/2017   GLUCOSEU Negative 04/07/2012     Neuropathy No results found for:  VITAMINB12, FOLATE, HGBA1C, HIV   CNS No results found for: COLORCSF, APPEARCSF, RBCCOUNTCSF, WBCCSF, POLYSCSF, LYMPHSCSF, EOSCSF, PROTEINCSF, GLUCCSF, JCVIRUS, CSFOLI, IGGCSF, LABACHR, ACETBL, LABACHR, ACETBL   Inflammation (CRP: Acute  ESR: Chronic) No results found for: CRP, ESRSEDRATE, LATICACIDVEN   Rheumatology No results found for: RF, ANA, LABURIC, URICUR, LYMEIGGIGMAB, LYMEABIGMQN, HLAB27   Coagulation Lab Results  Component Value Date   INR 1.09 03/20/2016   LABPROT 14.3 03/20/2016   PLT 206 07/27/2017     Cardiovascular Lab Results  Component Value Date   TROPONINI <0.03 07/27/2017   HGB 15.2 07/27/2017   HCT 45.1 07/27/2017     Screening Lab Results  Component Value Date   SARSCOV2NAA NEGATIVE 09/13/2020     Cancer No results found for: CEA, CA125, LABCA2   Allergens No results found for: ALMOND, APPLE, ASPARAGUS, AVOCADO, BANANA, BARLEY, BASIL, BAYLEAF, GREENBEAN, LIMABEAN, WHITEBEAN, BEEFIGE, REDBEET, BLUEBERRY, BROCCOLI, CABBAGE, MELON, CARROT, CASEIN, CASHEWNUT, CAULIFLOWER, CELERY     Note: Lab results reviewed.  [place]  Drug: Mr. Doig  reports no history of drug use. Alcohol:  reports current alcohol use. Tobacco:  reports that he has never smoked. He has never used smokeless tobacco. Medical:  has  a past medical history of Allergic rhinitis, Aneurysm of infrarenal abdominal aorta (02/2015), Anosmia, BPH (benign prostatic hyperplasia), COPD (chronic obstructive pulmonary disease) ([place]), Diverticulosis, Emphysema (subcutaneous) (surgical) resulting from a procedure, GERD (gastroesophageal reflux disease), History of bilateral inguinal hernias (02/2015), Hypertension, and Peripheral vascular disease ([place]) (02/2015). Family: family history includes Breast cancer in his mother and sister; Pancreatitis in his mother.  Past Surgical History:  Procedure Laterality Date   APPENDECTOMY     CHOLECYSTECTOMY     COLONOSCOPY  ~ 2007   at [facility] ARTHROPLASTY Left 2014   Active Ambulatory Problems    Diagnosis Date Noted   Rectal bleeding 03/20/2016   Diverticular hemorrhage 03/22/2016   Abdominal aortic aneurysm 03/22/2016   Resolved Ambulatory Problems    Diagnosis Date Noted   No Resolved Ambulatory Problems   Past Medical History:  Diagnosis Date   Allergic rhinitis    Aneurysm of infrarenal abdominal aorta 02/2015   Anosmia    BPH (benign prostatic hyperplasia)    COPD (chronic obstructive pulmonary disease) (HCC)    Diverticulosis    Emphysema (subcutaneous) (surgical) resulting from a procedure    GERD (gastroesophageal reflux disease)    History of bilateral inguinal hernias 02/2015   Hypertension    Peripheral vascular disease ([place]) 02/2015   Constitutional Exam  General appearance: Well nourished, well developed, and well hydrated. In no apparent acute distress Vitals:   07/08/21 1021  BP: (!) 134/57  Pulse: 79  Resp: 18  Temp: (!) 97.2 F (36.2 C)  TempSrc: Temporal  SpO2: 96%  Weight: 171 lb 11.2 oz (77.9 kg)  Height: 5' 8" (1.727 m)   BMI Assessment: Estimated body mass index is 26.11 kg/m as calculated from the following:   Height as of this encounter: 5' 8" (1.727 m).   Weight as of this encounter: 171 lb 11.2 oz (77.9 kg).  BMI interpretation table: BMI level Category Range association with higher incidence of chronic pain  <18 kg/m2 Underweight   18.5-24.9 kg/m2 Ideal body weight   25-29.9 kg/m2 Overweight Increased incidence by 20%  30-34.9 kg/m2 Obese (Class I) Increased incidence by 68%  35-39.9 kg/m2 Severe obesity (Class II) Increased incidence by 136%  >40 kg/m2 Extreme obesity (Class III) Increased incidence by 254%   Patient's current BMI Ideal Body weight  Body mass index is 26.11 kg/m. Ideal body weight: 68.4 kg (150 lb 12.7 oz) Adjusted ideal body weight: 72.2 kg (159 lb 2.5 oz)   BMI Readings from Last 4 Encounters:  07/08/21 26.11 kg/m  05/16/20  25.54 kg/m  07/27/17 26.47 kg/m  03/22/16 25.59 kg/m   Wt Readings from Last 4 Encounters:  07/08/21 171 lb 11.2 oz (77.9 kg)  05/16/20 168 lb (76.2 kg)  07/27/17 169 lb (76.7 kg)  03/22/16 168 lb 4.8 oz (76.3 kg)    Psych/Mental status: Alert, oriented x 3 (person, place, & time)       Eyes: PERLA Respiratory: No evidence of acute respiratory distress  Lumbar Spine Area Exam  Skin & Axial Inspection: Lumbar Scoliosis Alignment: Asymmetric Functional ROM: Pain restricted ROM       Stability: No instability detected Muscle Tone/Strength: Functionally intact. No obvious neuro-muscular anomalies detected. Sensory (Neurological): Musculoskeletal pain pattern and arthopathic  Gait & Posture Assessment  Ambulation: Limited Gait: Limited. Using assistive device to ambulate Posture: Difficulty standing up straight, due to pain  Lower Extremity Exam    Side:  Right lower extremity  Side: Left lower extremity  Stability: No instability observed          Stability: No instability observed          Skin & Extremity Inspection: Skin color, temperature, and hair growth are WNL. No peripheral edema or cyanosis. No masses, redness, swelling, asymmetry, or associated skin lesions. No contractures.  Skin & Extremity Inspection: Skin color, temperature, and hair growth are WNL. No peripheral edema or cyanosis. No masses, redness, swelling, asymmetry, or associated skin lesions. No contractures.  Functional ROM: Pain restricted ROM                  Functional ROM: Pain restricted ROM                  Muscle Tone/Strength: Functionally intact. No obvious neuro-muscular anomalies detected.  Muscle Tone/Strength: Functionally intact. No obvious neuro-muscular anomalies detected.  Sensory (Neurological): Arthropathic arthralgia        Sensory (Neurological): Arthropathic arthralgia        DTR: Patellar: deferred today Achilles: deferred today Plantar: deferred today  DTR: Patellar: deferred  today Achilles: deferred today Plantar: deferred today  Palpation: No palpable anomalies  Palpation: No palpable anomalies    Assessment  Primary Diagnosis & Pertinent Problem List: The primary encounter diagnosis was Lumbar spondylosis. A diagnosis of Lumbar facet arthropathy was also pertinent to this visit.  Visit Diagnosis (New problems to examiner): 1. Lumbar spondylosis   2. Lumbar facet arthropathy    Plan of Care    AURELIANO OSHIELDS has a history of greater than 3 months of moderate to severe pain which is resulted in functional impairment.  The patient has tried various conservative therapeutic options such as NSAIDs, Tylenol, muscle relaxants, physical therapy which was inadequately effective.  Patient's pain is predominantly axial with physical exam findings suggestive of facet arthropathy.Lumbar facet medial branch nerve blocks were discussed with the patient.  Risks and benefits were reviewed.  Patient will think about this further and let us know if he wants to proceed with bilateral L3, L4, L5 medial branch nerve block.   Provider-requested follow-up: Return if symptoms worsen or fail to improve. I spent a total of 40 minutes reviewing chart data, face-to-face evaluation with the patient, counseling and coordination of care as detailed above.  No future appointments.  Note by: Gillis Santa, MD Date: 07/08/2021; Time: 3:00 PM

## 2021-07-30 ENCOUNTER — Other Ambulatory Visit: Payer: Self-pay

## 2021-07-30 ENCOUNTER — Encounter: Payer: Self-pay | Admitting: Urology

## 2021-07-30 ENCOUNTER — Ambulatory Visit (INDEPENDENT_AMBULATORY_CARE_PROVIDER_SITE_OTHER): Payer: No Typology Code available for payment source | Admitting: Urology

## 2021-07-30 VITALS — BP 130/70 | HR 68 | Ht 68.0 in | Wt 170.0 lb

## 2021-07-30 DIAGNOSIS — N3941 Urge incontinence: Secondary | ICD-10-CM | POA: Diagnosis not present

## 2021-07-30 DIAGNOSIS — N401 Enlarged prostate with lower urinary tract symptoms: Secondary | ICD-10-CM

## 2021-07-30 LAB — BLADDER SCAN AMB NON-IMAGING: Scan Result: 55

## 2021-07-30 MED ORDER — MIRABEGRON ER 25 MG PO TB24: 25.0000 mg | ORAL_TABLET | Freq: Every day | ORAL | 0 refills | Status: DC

## 2021-07-30 NOTE — Progress Notes (Signed)
07/30/2021 1:26 PM   Steven Elliott 06-27-1930 [id number]  Referring provider: Center, [place] [address] [place],  [place] [address]  Chief Complaint  Patient presents with   Other    HPI: Steven Elliott is a 87 y.o. male referred from the [name] for evaluation of lower urinary tract symptoms. He presents today with his daughter.  ~3 month history of urinary frequency, urgency He denies incontinence however his daughter states he has had 3 episodes where his underwear and pants were soaked No nocturia Denies dysuria, gross hematuria Has been on tamsulosin for approximately 2 years Also complains of shrinkage of his penis which makes voiding more difficult IPSS completed today 11/35 with most bothersome symptoms frequency and urgency   PMH: Past Medical History:  Diagnosis Date   Allergic rhinitis    Aneurysm of infrarenal abdominal aorta 02/2015   5.4 cm by CT 03/20/16.    Anosmia    BPH (benign prostatic hyperplasia)    COPD (chronic obstructive pulmonary disease) (HCC)    Diverticulosis    Emphysema (subcutaneous) (surgical) resulting from a procedure    severe by CT 03/20/16   GERD (gastroesophageal reflux disease)    History of bilateral inguinal hernias 02/2015   Hypertension    Peripheral vascular disease ([place]) 02/2015   heavily calcified but patent bil iliac arteries by CTA 03/20/16.    Surgical History: Past Surgical History:  Procedure Laterality Date   APPENDECTOMY     CHOLECYSTECTOMY     COLONOSCOPY  ~ 2007   at [place] Left 2014    Home Medications:  Allergies as of 07/30/2021       Reactions   Procaine Anaphylaxis   Ciprofloxacin Other (See Comments), Nausea And Vomiting   Malaise, Rigor   Hydrocodone-acetaminophen Itching   Watery eyes   Omeprazole Other (See Comments)   GI reaction   Penicillins Other (See Comments)   Rabeprazole Other (See Comments)   GI reaction         Medication List        Accurate as of July 30, 2021  1:26 PM. If you have any questions, ask your nurse or doctor.          acetaminophen 500 MG tablet Commonly known as: TYLENOL Take 500 mg by mouth every 6 (six) hours as needed.   albuterol 108 (90 Base) MCG/ACT inhaler Commonly known as: VENTOLIN HFA Inhale 2 puffs into the lungs every 6 (six) hours as needed for wheezing or shortness of breath.   Calcium-D 600-400 MG-UNIT Tabs Take 1 tablet by mouth 2 (two) times daily.   cetirizine 5 MG tablet Commonly known as: ZYRTEC Take 1 tablet (5 mg total) by mouth daily.   cyclobenzaprine 5 MG tablet Commonly known as: FLEXERIL Take 5 mg by mouth 3 (three) times daily as needed for muscle spasms.   fluticasone 50 MCG/ACT nasal spray Commonly known as: FLONASE Place 1 spray into both nostrils daily.   gabapentin 100 MG capsule Commonly known as: NEURONTIN Take 200 mg by mouth at bedtime.   hydrochlorothiazide 25 MG tablet Commonly known as: HYDRODIURIL Take 12.5 mg by mouth daily.   ipratropium 0.06 % nasal spray Commonly known as: ATROVENT Place 2 sprays into both nostrils 2 (two) times daily.   multivitamin with minerals Tabs tablet Take 1 tablet by mouth daily.   senna-docusate 8.6-50 MG tablet Commonly known as: Senokot-S Take 1 tablet  by mouth 2 (two) times daily.   tamsulosin 0.4 MG Caps capsule Commonly known as: FLOMAX Take 0.4 mg by mouth at bedtime.   UNKNOWN TO PATIENT "2 memory pills"        Allergies:  Allergies  Allergen Reactions   Procaine Anaphylaxis   Ciprofloxacin Other (See Comments) and Nausea And Vomiting    Malaise, Rigor   Hydrocodone-Acetaminophen Itching    Watery eyes   Omeprazole Other (See Comments)    GI reaction   Penicillins Other (See Comments)   Rabeprazole Other (See Comments)    GI reaction    Family History: Family History  Problem Relation Age of Onset   Breast cancer Mother    Pancreatitis  Mother    Breast cancer Sister     Social History:  reports that he has never smoked. He has never used smokeless tobacco. He reports current alcohol use. He reports that he does not use drugs.   Physical Exam: BP 130/70   Pulse 68   Ht 5\' 8"  (1.727 m)   Wt 170 lb (77.1 kg)   BMI 25.85 kg/m   Constitutional:  Alert and oriented, No acute distress. HEENT: [place] AT, moist mucus membranes.  Trachea midline, no masses. Cardiovascular: No clubbing, cyanosis, or edema. Respiratory: Normal respiratory effort, no increased work of breathing. GI: Abdomen is soft, nontender, nondistended, no abdominal masses GU: Phallus circumcised, no abnormalities noted.  Testes descended bilaterally without masses or tenderness.  Prostate high riding and difficult to palpate approximately 40 g without nodules or induration Skin: No rashes, bruises or suspicious lesions.   Laboratory Data:  Urinalysis 07/30/2021: Dipstick/microscopy negative   Assessment & Plan:    1.  BPH with LUTS Bladder scan PVR 55 mL UA today unremarkable Continue tamsulosin Trial Myrbetriq 25 mg daily PA follow-up 1 month for symptom reassessment and repeat PVR   Abbie Sons, MD  [facility] [address], [place], [place] [address] [phone]

## 2021-07-31 LAB — MICROSCOPIC EXAMINATION: Bacteria, UA: NONE SEEN

## 2021-07-31 LAB — URINALYSIS, COMPLETE
Bilirubin, UA: NEGATIVE
Glucose, UA: NEGATIVE
Ketones, UA: NEGATIVE
Leukocytes,UA: NEGATIVE
Nitrite, UA: NEGATIVE
Protein,UA: NEGATIVE
RBC, UA: NEGATIVE
Specific Gravity, UA: 1.015 (ref 1.005–1.030)
Urobilinogen, Ur: 0.2 mg/dL (ref 0.2–1.0)
pH, UA: 7 (ref 5.0–7.5)

## 2021-08-29 ENCOUNTER — Ambulatory Visit (INDEPENDENT_AMBULATORY_CARE_PROVIDER_SITE_OTHER): Payer: No Typology Code available for payment source | Admitting: Physician Assistant

## 2021-08-29 ENCOUNTER — Ambulatory Visit: Payer: Self-pay | Admitting: Physician Assistant

## 2021-08-29 ENCOUNTER — Other Ambulatory Visit: Payer: Self-pay

## 2021-08-29 ENCOUNTER — Encounter: Payer: Self-pay | Admitting: Physician Assistant

## 2021-08-29 VITALS — BP 148/65 | HR 74 | Ht 68.0 in | Wt 169.0 lb

## 2021-08-29 DIAGNOSIS — R350 Frequency of micturition: Secondary | ICD-10-CM | POA: Diagnosis not present

## 2021-08-29 DIAGNOSIS — N401 Enlarged prostate with lower urinary tract symptoms: Secondary | ICD-10-CM

## 2021-08-29 LAB — BLADDER SCAN AMB NON-IMAGING

## 2021-08-29 MED ORDER — MIRABEGRON ER 25 MG PO TB24: 25.0000 mg | ORAL_TABLET | Freq: Every day | ORAL | 11 refills | Status: DC

## 2021-08-29 NOTE — Patient Instructions (Signed)
Let's continue the Myrbetriq 25mg  daily. If you feel like you're not able to urinate on the full dose of this medication, please call our clinic to come see me so we can scan your bladder and make sure you are emptying it appropriately. During the day, I want you to start timed voiding: every 2 hours, please go urinate even if you do not feel the urge to do so.

## 2021-08-29 NOTE — Progress Notes (Signed)
08/29/2021 3:54 PM   [name] 03-11-1930 [id number]  CC: Chief Complaint  Patient presents with   Benign Prostatic Hypertrophy   HPI: Steven Elliott is a 87 y.o. male with PMH BPH on Flomax with storage related voiding symptoms who presents today for symptom recheck on Myrbetriq 25 mg daily.  He is accompanied today by his daughter, who contributes to HPI.  Today he reports possible symptomatic improvement on Myrbetriq, specifically in regard to his urinary frequency.  He is still having episodes of urge incontinence but urinates less than every 2 hours during the daytime.  They have been cutting his Myrbetriq in half because when he first started the medication he reported that he did not feel he was emptying his bladder well.  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    Anosmia    BPH (benign prostatic hyperplasia)    COPD (chronic obstructive pulmonary disease) (HCC)    Diverticulosis    Emphysema (subcutaneous) (surgical) resulting from a procedure    severe by CT 03/20/16   GERD (gastroesophageal reflux disease)    History of bilateral inguinal hernias 02/2015   Hypertension    Peripheral vascular disease ([place]) 02/2015   heavily calcified but patent bil iliac arteries by CTA 03/20/16.    Surgical History: Past Surgical History:  Procedure Laterality Date   APPENDECTOMY     CHOLECYSTECTOMY     COLONOSCOPY  ~ 2007   at [facility] Left 2014    Home Medications:  Allergies as of 08/29/2021       Reactions   Procaine Anaphylaxis   Ciprofloxacin Other (See Comments), Nausea And Vomiting   Malaise, Rigor   Hydrocodone-acetaminophen Itching   Watery eyes   Omeprazole Other (See Comments)   GI reaction   Penicillins Other (See Comments)   Rabeprazole Other (See Comments)   GI reaction         Medication List        Accurate as of August 29, 2021  3:54 PM. If you have any questions, ask your nurse or doctor.          acetaminophen 500 MG tablet Commonly known as: TYLENOL Take 500 mg by mouth every 6 (six) hours as needed.   albuterol 108 (90 Base) MCG/ACT inhaler Commonly known as: VENTOLIN HFA Inhale 2 puffs into the lungs every 6 (six) hours as needed for wheezing or shortness of breath.   Calcium-D 600-400 MG-UNIT Tabs Take 1 tablet by mouth 2 (two) times daily.   cetirizine 5 MG tablet Commonly known as: ZYRTEC Take 1 tablet (5 mg total) by mouth daily.   cyclobenzaprine 5 MG tablet Commonly known as: FLEXERIL Take 5 mg by mouth 3 (three) times daily as needed for muscle spasms.   fluticasone 50 MCG/ACT nasal spray Commonly known as: FLONASE Place 1 spray into both nostrils daily.   gabapentin 100 MG capsule Commonly known as: NEURONTIN Take 200 mg by mouth at bedtime.   hydrochlorothiazide 25 MG tablet Commonly known as: HYDRODIURIL Take 12.5 mg by mouth daily.   ipratropium 0.06 % nasal spray Commonly known as: ATROVENT Place 2 sprays into both nostrils 2 (two) times daily.   mirabegron ER 25 MG Tb24 tablet Commonly known as: MYRBETRIQ Take 1 tablet (25 mg  total) by mouth daily.   multivitamin with minerals Tabs tablet Take 1 tablet by mouth daily.   senna-docusate 8.6-50 MG tablet Commonly known as: Senokot-S Take 1 tablet by mouth 2 (two) times daily.   tamsulosin 0.4 MG Caps capsule Commonly known as: FLOMAX Take 0.4 mg by mouth at bedtime.   UNKNOWN TO PATIENT "2 memory pills"        Allergies:  Allergies  Allergen Reactions   Procaine Anaphylaxis   Ciprofloxacin Other (See Comments) and Nausea And Vomiting    Malaise, Rigor   Hydrocodone-Acetaminophen Itching    Watery eyes   Omeprazole Other (See Comments)    GI reaction   Penicillins Other (See Comments)   Rabeprazole Other (See Comments)    GI reaction     Family History: Family History  Problem Relation Age of Onset   Breast cancer Mother    Pancreatitis Mother    Breast cancer Sister     Social History:   reports that he has never smoked. He has never used smokeless tobacco. He reports current alcohol use. He reports that he does not use drugs.  Physical Exam: BP (!) 148/65   Pulse 74   Ht 5\' 8"  (1.727 m)   Wt 169 lb (76.7 kg)   BMI 25.70 kg/m   Constitutional:  Alert and oriented, no acute distress, nontoxic appearing HEENT: [place], AT Cardiovascular: No clubbing, cyanosis, or edema Respiratory: Normal respiratory effort, no increased work of breathing Skin: No rashes, bruises or suspicious lesions Neurologic: Grossly intact, no focal deficits, moving all 4 extremities Psychiatric: Normal mood and affect  Laboratory Data: Results for orders placed or performed in visit on 08/29/21  Bladder Scan (Post Void Residual) in office  Result Value Ref Range   Scan Result 80mL    Assessment & Plan:   1. Benign prostatic hyperplasia with urinary frequency Patient wishes to continue Myrbetriq.  We discussed combining this with timed voiding every 2 hours during the daytime to try to avoid his incontinence episodes.  Patient is in agreement with this plan.  We will plan for symptom recheck and PVR in 2 months.  I did counsel them to not split Myrbetriq in half, though if 25 mg dose is too powerful for him, may consider switching him to [place], which may be crushed.  Counseled him to return to clinic for same-day evaluation if he develops difficulty voiding. - Bladder Scan (Post Void Residual) in office - mirabegron ER (MYRBETRIQ) 25 MG TB24 tablet; Take 1 tablet (25 mg total) by mouth daily.  Dispense: 30 tablet; Refill: 11  Return in about 2 months (around 10/30/2021) for Symptom recheck with PVR.  Debroah Loop, PA-C  [facility] [address] [place], [place] [address] [phone]

## 2021-09-19 ENCOUNTER — Other Ambulatory Visit: Payer: Self-pay

## 2021-09-19 ENCOUNTER — Ambulatory Visit (INDEPENDENT_AMBULATORY_CARE_PROVIDER_SITE_OTHER): Payer: No Typology Code available for payment source | Admitting: Plastic Surgery

## 2021-09-19 VITALS — BP 149/68 | HR 68 | Ht 68.0 in | Wt 169.0 lb

## 2021-09-19 DIAGNOSIS — D489 Neoplasm of uncertain behavior, unspecified: Secondary | ICD-10-CM | POA: Diagnosis not present

## 2021-09-22 NOTE — Progress Notes (Signed)
Referring Provider Center, [place] [address] [place],  [place] [address]   CC:  Nasal lesions  Steven Elliott is an 88 y.o. male.  HPI: Patient is an 88 year old male who is referred from the [name] dermatology for 2 lesions which are very suspicious for skin cancer.  Patient has significant sun damage and has had skin cancer in the past.  He currently has a suspicious lesion on the tip of his nose as well as a left ear nonhealing wound on the helix.  Allergies  Allergen Reactions   Procaine Anaphylaxis   Ciprofloxacin Other (See Comments) and Nausea And Vomiting    Malaise, Rigor   Hydrocodone-Acetaminophen Itching    Watery eyes   Omeprazole Other (See Comments)    GI reaction   Penicillins Other (See Comments)   Rabeprazole Other (See Comments)    GI reaction    Outpatient Encounter Medications as of 09/19/2021  Medication Sig Note   acetaminophen (TYLENOL) 500 MG tablet Take 500 mg by mouth every 6 (six) hours as needed.    albuterol (PROVENTIL HFA;VENTOLIN HFA) 108 (90 Base) MCG/ACT inhaler Inhale 2 puffs into the lungs every 6 (six) hours as needed for wheezing or shortness of breath.    Calcium Carbonate-Vitamin D (CALCIUM-D) 600-400 MG-UNIT TABS Take 1 tablet by mouth 2 (two) times daily.    cetirizine (ZYRTEC) 5 MG tablet Take 1 tablet (5 mg total) by mouth daily.    cyclobenzaprine (FLEXERIL) 5 MG tablet Take 5 mg by mouth 3 (three) times daily as needed for muscle spasms.    fluticasone (FLONASE) 50 MCG/ACT nasal spray Place 1 spray into both nostrils daily.    gabapentin (NEURONTIN) 100 MG capsule Take 200 mg by mouth at bedtime. 07/08/2021: Pt. Taking 100 at HS   galantamine (RAZADYNE ER) 24 MG 24 hr capsule Take 24 mg by mouth daily with breakfast.    hydrochlorothiazide (HYDRODIURIL) 25 MG tablet Take 12.5 mg by mouth daily.    ipratropium (ATROVENT) 0.06 % nasal spray Place 2 sprays into both nostrils 2 (two) times daily.    memantine (NAMENDA) 10 MG  tablet Take 10 mg by mouth 2 (two) times daily.    mirabegron ER (MYRBETRIQ) 25 MG TB24 tablet Take 1 tablet (25 mg total) by mouth daily.    Multiple Vitamin (MULTIVITAMIN WITH MINERALS) TABS tablet Take 1 tablet by mouth daily.    senna-docusate (SENOKOT-S) 8.6-50 MG tablet Take 1 tablet by mouth 2 (two) times daily.    tamsulosin (FLOMAX) 0.4 MG CAPS capsule Take 0.4 mg by mouth at bedtime.    [DISCONTINUED] UNKNOWN TO PATIENT "2 memory pills"    No facility-administered encounter medications on file as of 09/19/2021.     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    Anosmia    BPH (benign prostatic hyperplasia)    COPD (chronic obstructive pulmonary disease) (HCC)    Diverticulosis    Emphysema (subcutaneous) (surgical) resulting from a procedure    severe by CT 03/20/16   GERD (gastroesophageal reflux disease)    History of bilateral inguinal hernias 02/2015   Hypertension    Peripheral vascular disease ([place]) 02/2015   heavily calcified but patent bil iliac arteries by CTA 03/20/16.    Past Surgical History:  Procedure Laterality Date   APPENDECTOMY     CHOLECYSTECTOMY     COLONOSCOPY  ~  2007   at [place] Left 2014    Family History  Problem Relation Age of Onset   Breast cancer Mother    Pancreatitis Mother    Breast cancer Sister     Social History   Social History Narrative   Not on file     Review of Systems General: Denies fevers, chills, weight loss CV: Denies chest pain, shortness of breath, palpitations   Physical Exam Vitals with BMI 09/19/2021 08/29/2021 07/30/2021  Height 5\' 8"  5\' 8"  5\' 8"   Weight 169 lbs 169 lbs 170 lbs  BMI 25.7 03.2 12.24  Systolic 825 003 704  Diastolic 68 65 70  Pulse 68 74 68    General:  No acute distress,  Alert and oriented, Non-Toxic, Normal speech and affect HEENT: Nose has a  subtle not well-defined proximately 1 cm lesion with 2 small areas that are bleeding medial and laterally along the nasal tip.  Left ear has an area of eschar with a very superficial open wound that appears to be within a neoplasm  Assessment/Plan Both the nasal tip lesion and the left ear lesion are very suspicious for nonpigmented skin cancer.  With regards to the left ear I think that direct excision with closure of skin may be possible.  If I am unable to get a clear margin this with then a small wedge excision could be performed.  The nasal tip lesion is not well-defined so this would be a good lesion for possible Mohs surgery.  We will further discussed with the patient Mohs surgery versus direct excision and reconstruction   Lennice Sites 09/22/2021, 11:12 AM

## 2021-09-29 ENCOUNTER — Encounter: Payer: Self-pay | Admitting: Physician Assistant

## 2021-10-29 NOTE — Progress Notes (Signed)
10/30/2021 3:47 PM   [name] Sep 05, 1930 [id number]  Referring provider: No referring provider defined for this encounter.  Chief Complaint  Patient presents with   Benign Prostatic Hypertrophy   Urological history 1. BPH with LU TS -I PSS 4/3 -PVR 76 mL  HPI: Steven Elliott is a 88 y.o. male who presents today for a 2 months follow up with his daughter, [name].  He has no urinary complaints at this time.  Patient denies any modifying or aggravating factors.  Patient denies any gross hematuria, dysuria or suprapubic/flank pain.  Patient denies any fevers, chills, nausea or vomiting.     IPSS     Row Name 10/30/21 1500         International Prostate Symptom Score   How often have you had the sensation of not emptying your bladder? Not at All     How often have you had to urinate less than every two hours? Less than 1 in 5 times     How often have you found you stopped and started again several times when you urinated? Not at All     How often have you found it difficult to postpone urination? Less than 1 in 5 times     How often have you had a weak urinary stream? Not at All     How often have you had to strain to start urination? Not at All     How many times did you typically get up at night to urinate? 2 Times     Total IPSS Score 4       Quality of Life due to urinary symptoms   If you were to spend the rest of your life with your urinary condition just the way it is now how would you feel about that? 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    Anosmia    BPH (benign prostatic hyperplasia)    COPD (chronic obstructive pulmonary disease) (HCC)    Diverticulosis    Emphysema (subcutaneous) (surgical) resulting from a procedure    severe by CT 03/20/16   GERD  (gastroesophageal reflux disease)    History of bilateral inguinal hernias 02/2015   Hypertension    Peripheral vascular disease ([place]) 02/2015   heavily calcified but patent bil iliac arteries by CTA 03/20/16.    Surgical History: Past Surgical History:  Procedure Laterality Date   APPENDECTOMY     CHOLECYSTECTOMY     COLONOSCOPY  ~ 2007   at [place] Left 2014    Home Medications:  Allergies as of 10/30/2021       Reactions   Procaine Anaphylaxis   Ciprofloxacin Other (See Comments), Nausea And Vomiting   Malaise, Rigor   Hydrocodone    Other reaction(s): Rash, Hives, Heart palpitations ()   Hydrocodone-acetaminophen Itching   Watery eyes   Lisinopril Other (See Comments)   Omeprazole Other (See Comments)   GI reaction   Penicillins Other (See Comments)   Rabeprazole Other (See Comments)   GI reaction        Medication List        Accurate as of  October 30, 2021  3:47 PM. If you have any questions, ask your nurse or doctor.          acetaminophen 500 MG tablet Commonly known as: TYLENOL Take 500 mg by mouth every 6 (six) hours as needed.   albuterol 108 (90 Base) MCG/ACT inhaler Commonly known as: VENTOLIN HFA Inhale 2 puffs into the lungs every 6 (six) hours as needed for wheezing or shortness of breath.   Calcium-D 600-400 MG-UNIT Tabs Take 1 tablet by mouth 2 (two) times daily.   cetirizine 5 MG tablet Commonly known as: ZYRTEC Take 1 tablet (5 mg total) by mouth daily.   cyclobenzaprine 5 MG tablet Commonly known as: FLEXERIL Take 5 mg by mouth 3 (three) times daily as needed for muscle spasms.   fluticasone 50 MCG/ACT nasal spray Commonly known as: FLONASE Place 1 spray into both nostrils daily.   gabapentin 100 MG capsule Commonly known as: NEURONTIN Take 200 mg by mouth at bedtime.   galantamine 24 MG 24 hr capsule Commonly known as: RAZADYNE ER Take 24 mg by mouth daily with  breakfast.   hydrochlorothiazide 25 MG tablet Commonly known as: HYDRODIURIL Take 12.5 mg by mouth daily.   ipratropium 0.06 % nasal spray Commonly known as: ATROVENT Place 2 sprays into both nostrils 2 (two) times daily.   memantine 10 MG tablet Commonly known as: NAMENDA Take 10 mg by mouth 2 (two) times daily.   mirabegron ER 50 MG Tb24 tablet Commonly known as: MYRBETRIQ Take 1 tablet (50 mg total) by mouth daily. What changed:  medication strength how much to take Changed by: Natarsha Hurwitz, PA-C   multivitamin with minerals Tabs tablet Take 1 tablet by mouth daily.   senna-docusate 8.6-50 MG tablet Commonly known as: Senokot-S Take 1 tablet by mouth 2 (two) times daily.   tamsulosin 0.4 MG Caps capsule Commonly known as: FLOMAX Take 0.4 mg by mouth at bedtime.        Allergies:  Allergies  Allergen Reactions   Procaine Anaphylaxis   Ciprofloxacin Other (See Comments) and Nausea And Vomiting    Malaise, Rigor   Hydrocodone     Other reaction(s): Rash, Hives, Heart palpitations ()   Hydrocodone-Acetaminophen Itching    Watery eyes   Lisinopril Other (See Comments)   Omeprazole Other (See Comments)    GI reaction   Penicillins Other (See Comments)   Rabeprazole Other (See Comments)    GI reaction    Family History: Family History  Problem Relation Age of Onset   Breast cancer Mother    Pancreatitis Mother    Breast cancer Sister     Social History:  reports that he has never smoked. He has never used smokeless tobacco. He reports current alcohol use. He reports that he does not use drugs.  ROS: Pertinent ROS in HPI  Physical Exam: BP 120/66    Pulse 92    Ht 5\' 8"  (1.727 m)    Wt 178 lb (80.7 kg)    BMI 27.06 kg/m   Constitutional:  Well nourished. Alert and oriented, No acute distress. HEENT: [place] AT, mask in place.  Trachea midline Cardiovascular: No clubbing, cyanosis, or edema. Respiratory: Normal respiratory effort, no increased work of  breathing. Neurologic: Grossly intact, no focal deficits, moving all 4 extremities. Psychiatric: Normal mood and affect.  Laboratory Data: N/A   Pertinent Imaging:  10/30/21 15:42  Scan Result 15mL       Assessment & Plan:    1. BPH  with LUTS -PVR < 300 cc -continue conservative management, avoiding bladder irritants and timed voiding's   Return in about 1 year (around 10/30/2022) for PVR .  These notes generated with voice recognition software. I apologize for typographical errors.  Zara Council, PA-C  [facility] [address]  [place], [place] [address] [phone]   I spent 15 minutes on the day of the encounter to include pre-visit record review, face-to-face time with the patient, and post-visit ordering of tests.

## 2021-10-30 ENCOUNTER — Ambulatory Visit (INDEPENDENT_AMBULATORY_CARE_PROVIDER_SITE_OTHER): Payer: No Typology Code available for payment source | Admitting: Urology

## 2021-10-30 ENCOUNTER — Encounter: Payer: Self-pay | Admitting: Urology

## 2021-10-30 ENCOUNTER — Other Ambulatory Visit: Payer: Self-pay

## 2021-10-30 ENCOUNTER — Ambulatory Visit: Payer: No Typology Code available for payment source | Admitting: Physician Assistant

## 2021-10-30 VITALS — BP 120/66 | HR 92 | Ht 68.0 in | Wt 178.0 lb

## 2021-10-30 DIAGNOSIS — N3941 Urge incontinence: Secondary | ICD-10-CM | POA: Diagnosis not present

## 2021-10-30 LAB — BLADDER SCAN AMB NON-IMAGING

## 2021-10-30 MED ORDER — MIRABEGRON ER 50 MG PO TB24: 50.0000 mg | ORAL_TABLET | Freq: Every day | ORAL | 3 refills | Status: DC

## 2021-11-07 ENCOUNTER — Telehealth: Payer: Self-pay | Admitting: Urology

## 2021-11-07 NOTE — Telephone Encounter (Signed)
Pt's daughter called and said Myrbetriq is not working because he keeps wetting his pants.  She wants to know if there's anything different that could be tried.

## 2021-11-10 MED ORDER — TROSPIUM CHLORIDE 20 MG PO TABS: 20.0000 mg | ORAL_TABLET | Freq: Two times a day (BID) | ORAL | 0 refills | Status: DC

## 2021-11-10 NOTE — Telephone Encounter (Signed)
Spoke with patient's daughter and she wanted the prescription mailed to her home address she she make take to [name] in [place]. I advised I could send electronically . Daughter was wanting samples like myrbetriq, advised we don't have samples of trospium.

## 2021-11-20 MED ORDER — TROSPIUM CHLORIDE 20 MG PO TABS: 20.0000 mg | ORAL_TABLET | Freq: Two times a day (BID) | ORAL | 11 refills | Status: DC

## 2021-11-20 NOTE — Addendum Note (Signed)
Addended by: Verlene Mayer A on: 11/20/2021 03:50 PM ? ? Modules accepted: Orders ? ?

## 2021-11-20 NOTE — Telephone Encounter (Signed)
Patient called triage line requesting prescription for Trospium, sent to [facility]. Sent RX electronically.  ?

## 2021-11-24 ENCOUNTER — Other Ambulatory Visit: Payer: Self-pay

## 2021-11-24 DIAGNOSIS — N3941 Urge incontinence: Secondary | ICD-10-CM

## 2021-11-24 MED ORDER — TROSPIUM CHLORIDE 20 MG PO TABS: 20.0000 mg | ORAL_TABLET | Freq: Two times a day (BID) | ORAL | 3 refills | Status: DC

## 2021-11-24 NOTE — Telephone Encounter (Signed)
Pt's daughter calls triage line and states that she requested a 90 day supply of her father's medication be mailed to their home. She received RX today and it was only a 30 day supply. She states she is very frustrated as she has been trying to get this medication for over a month. She states she was initially told we could not fax or e-scribe medication to the [name]. Advised daughter that I would send 90 day supply of medication via e-scribe to the [name] immediately. Advised her I would call her back with update.  ?

## 2022-03-11 ENCOUNTER — Ambulatory Visit (INDEPENDENT_AMBULATORY_CARE_PROVIDER_SITE_OTHER): Payer: No Typology Code available for payment source | Admitting: Urology

## 2022-03-11 ENCOUNTER — Encounter: Payer: Self-pay | Admitting: Urology

## 2022-03-11 DIAGNOSIS — R350 Frequency of micturition: Secondary | ICD-10-CM

## 2022-03-11 DIAGNOSIS — N401 Enlarged prostate with lower urinary tract symptoms: Secondary | ICD-10-CM | POA: Diagnosis not present

## 2022-03-11 DIAGNOSIS — R3915 Urgency of urination: Secondary | ICD-10-CM

## 2022-03-11 LAB — URINALYSIS, COMPLETE
Bilirubin, UA: NEGATIVE
Glucose, UA: NEGATIVE
Ketones, UA: NEGATIVE
Leukocytes,UA: NEGATIVE
Nitrite, UA: NEGATIVE
RBC, UA: NEGATIVE
Specific Gravity, UA: 1.015 (ref 1.005–1.030)
Urobilinogen, Ur: 0.2 mg/dL (ref 0.2–1.0)
pH, UA: 7 (ref 5.0–7.5)

## 2022-03-11 LAB — MICROSCOPIC EXAMINATION: Bacteria, UA: NONE SEEN

## 2022-03-11 LAB — BLADDER SCAN AMB NON-IMAGING: Scan Result: 96

## 2022-03-11 NOTE — Progress Notes (Signed)
03/11/2022 1:45 PM   Steven Elliott 04/29/1930 [id number]  Referring provider: [facility] [place] [address] [place],  [place] [address]  Chief Complaint  Patient presents with   Follow-up    HPI: 88 y.o. male presents for follow-up.  Initially seen November 2022 for urinary frequency and urgency Was on tamsulosin at the time of that visit and given a trial of Myrbetriq 25 mg with some improvement on 1 month follow-up.  The Myrbetriq was titrated to 50 mg and at a follow-up visit February 2023 PVR was 76 mL and he had no voiding complaints.  IPSS was 4/35 His daughter states he requested to stop the Myrbetriq because he felt it made him unable to void and this was discontinued.  Steven Elliott Rx was sent to pharmacy however they never started. He has intermittent symptoms stating he is unable to empty his bladder. He remains on tamsulosin which is his only GU medication He does have some cognitive deficits IPSS today was 7/35   PMH: Past Medical History:  Diagnosis Date   Allergic rhinitis    Aneurysm of infrarenal abdominal aorta ([place]) 02/2015   5.4 cm by CT 03/20/16.    Anosmia    BPH (benign prostatic hyperplasia)    COPD (chronic obstructive pulmonary disease) (HCC)    Diverticulosis    Emphysema (subcutaneous) (surgical) resulting from a procedure    severe by CT 03/20/16   GERD (gastroesophageal reflux disease)    History of bilateral inguinal hernias 02/2015   Hypertension    Peripheral vascular disease ([place]) 02/2015   heavily calcified but patent bil iliac arteries by CTA 03/20/16.    Surgical History: Past Surgical History:  Procedure Laterality Date   APPENDECTOMY     CHOLECYSTECTOMY     COLONOSCOPY  ~ 2007   at [facility] Left 2014    Home Medications:  Allergies as of 03/11/2022       Reactions   Procaine Anaphylaxis   Ciprofloxacin Other (See Comments), Nausea And Vomiting   Malaise, Rigor    Hydrocodone    Other reaction(s): Rash, Hives, Heart palpitations ()   Hydrocodone-acetaminophen Itching   Watery eyes   Lisinopril Other (See Comments)   Omeprazole Other (See Comments)   GI reaction   Penicillins Other (See Comments)   Rabeprazole Other (See Comments)   GI reaction        Medication List        Accurate as of March 11, 2022  1:45 PM. If you have any questions, ask your nurse or doctor.          STOP taking these medications    senna-docusate 8.6-50 MG tablet Commonly known as: Senokot-S Stopped by: Steven Sons, MD       TAKE these medications    acetaminophen 500 MG tablet Commonly known as: TYLENOL Take 500 mg by mouth every 6 (six) hours as needed.   albuterol 108 (90 Base) MCG/ACT inhaler Commonly known as: VENTOLIN HFA Inhale 2 puffs into the lungs every 6 (six) hours as needed for wheezing or shortness of breath.   amLODipine 5 MG tablet Commonly known as: NORVASC TAKE ONE TABLET BY MOUTH DAILY FOR BLOOD PRESSURE.   Calcium-D 600-400 MG-UNIT Tabs Take 1 tablet by mouth 2 (two) times daily.   cetirizine 5 MG tablet Commonly known as: ZYRTEC Take 1 tablet (5 mg total) by mouth daily.   cyclobenzaprine 5  MG tablet Commonly known as: FLEXERIL Take 5 mg by mouth 3 (three) times daily as needed for muscle spasms.   fluticasone 50 MCG/ACT nasal spray Commonly known as: FLONASE Place 1 spray into both nostrils daily.   gabapentin 100 MG capsule Commonly known as: NEURONTIN Take 200 mg by mouth at bedtime.   galantamine 24 MG 24 hr capsule Commonly known as: RAZADYNE ER Take 24 mg by mouth daily with breakfast.   hydrochlorothiazide 25 MG tablet Commonly known as: HYDRODIURIL Take 12.5 mg by mouth daily.   ipratropium 0.06 % nasal spray Commonly known as: ATROVENT Place 2 sprays into both nostrils 2 (two) times daily.   ketoconazole 2 % shampoo Commonly known as: NIZORAL SHAMPOO AS DIRECTED AFFECTED AREA THREE TIMES A  WEEK AS NEEDED (LEAVE ON AFFECTED AREA FOR 10 MINUTES, THEN RINSE OFF WITH WATER) MAY USE AS A FACE WASH DAILY TO SCALY SPOTS   memantine 10 MG tablet Commonly known as: NAMENDA Take 10 mg by mouth 2 (two) times daily.   multivitamin with minerals Tabs tablet Take 1 tablet by mouth daily.   tamsulosin 0.4 MG Caps capsule Commonly known as: FLOMAX Take 0.4 mg by mouth at bedtime.   traZODone 50 MG tablet Commonly known as: DESYREL TAKE ONE TABLET BY MOUTH AT BEDTIME --MAY START WITH QUARTER TABLETS, MAY PROGRESS TO WHOLE TABLETS FOR INSOMNIA   trospium 20 MG tablet Commonly known as: SANCTURA Take 1 tablet (20 mg total) by mouth 2 (two) times daily.   vitamin B-12 500 MCG tablet Commonly known as: CYANOCOBALAMIN Take 1 tablet by mouth daily.        Allergies:  Allergies  Allergen Reactions   Procaine Anaphylaxis   Ciprofloxacin Other (See Comments) and Nausea And Vomiting    Malaise, Rigor   Hydrocodone     Other reaction(s): Rash, Hives, Heart palpitations ()   Hydrocodone-Acetaminophen Itching    Watery eyes   Lisinopril Other (See Comments)   Omeprazole Other (See Comments)    GI reaction   Penicillins Other (See Comments)   Rabeprazole Other (See Comments)    GI reaction    Family History: Family History  Problem Relation Age of Onset   Breast cancer Mother    Pancreatitis Mother    Breast cancer Sister     Social History:  reports that he has never smoked. He has never used smokeless tobacco. He reports current alcohol use. He reports that he does not use drugs.   Physical Exam: There were no vitals taken for this visit.  Constitutional:  Alert, No acute distress. HEENT: [place] AT Respiratory: Normal respiratory effort, no increased    Assessment & Plan:    1.  BPH with LUTS Bladder scan PVR 96 mL Continue tamsulosin Bladder scans at all of his recent visits have been acceptable and reassured daughter that he is adequately emptying his bladder and  discussed this with the patient He has a follow-up scheduled February 2024   Steven Elliott, [place] [address], [place],  [address] [phone]

## 2022-03-24 ENCOUNTER — Emergency Department: Payer: No Typology Code available for payment source

## 2022-03-24 ENCOUNTER — Encounter: Payer: Self-pay | Admitting: Emergency Medicine

## 2022-03-24 ENCOUNTER — Other Ambulatory Visit: Payer: Self-pay

## 2022-03-24 ENCOUNTER — Emergency Department
Admission: EM | Admit: 2022-03-24 | Discharge: 2022-03-24 | Disposition: A | Discharge status: Home / Self Care | Payer: No Typology Code available for payment source | Attending: Emergency Medicine | Admitting: Emergency Medicine

## 2022-03-24 DIAGNOSIS — Y92009 Unspecified place in unspecified non-institutional (private) residence as the place of occurrence of the external cause: Secondary | ICD-10-CM | POA: Diagnosis not present

## 2022-03-24 DIAGNOSIS — J449 Chronic obstructive pulmonary disease, unspecified: Secondary | ICD-10-CM | POA: Insufficient documentation

## 2022-03-24 DIAGNOSIS — S0181XA Laceration without foreign body of other part of head, initial encounter: Secondary | ICD-10-CM | POA: Insufficient documentation

## 2022-03-24 DIAGNOSIS — S0990XA Unspecified injury of head, initial encounter: Secondary | ICD-10-CM | POA: Diagnosis present

## 2022-03-24 DIAGNOSIS — I10 Essential (primary) hypertension: Secondary | ICD-10-CM | POA: Diagnosis not present

## 2022-03-24 DIAGNOSIS — S51012A Laceration without foreign body of left elbow, initial encounter: Secondary | ICD-10-CM | POA: Diagnosis not present

## 2022-03-24 DIAGNOSIS — W01198A Fall on same level from slipping, tripping and stumbling with subsequent striking against other object, initial encounter: Secondary | ICD-10-CM | POA: Insufficient documentation

## 2022-03-24 DIAGNOSIS — S065XAA Traumatic subdural hemorrhage with loss of consciousness status unknown, initial encounter: Secondary | ICD-10-CM | POA: Insufficient documentation

## 2022-03-24 LAB — CBC WITH DIFFERENTIAL/PLATELET
Abs Immature Granulocytes: 0.04 10*3/uL (ref 0.00–0.07)
Basophils Absolute: 0 10*3/uL (ref 0.0–0.1)
Basophils Relative: 0 %
Eosinophils Absolute: 0.1 10*3/uL (ref 0.0–0.5)
Eosinophils Relative: 2 %
HCT: 42 % (ref 39.0–52.0)
Hemoglobin: 14.1 g/dL (ref 13.0–17.0)
Immature Granulocytes: 1 %
Lymphocytes Relative: 15 %
Lymphs Abs: 1.1 10*3/uL (ref 0.7–4.0)
MCH: 31.8 pg (ref 26.0–34.0)
MCHC: 33.6 g/dL (ref 30.0–36.0)
MCV: 94.6 fL (ref 80.0–100.0)
Monocytes Absolute: 0.6 10*3/uL (ref 0.1–1.0)
Monocytes Relative: 8 %
Neutro Abs: 5.6 10*3/uL (ref 1.7–7.7)
Neutrophils Relative %: 74 %
Platelets: 171 10*3/uL (ref 150–400)
RBC: 4.44 MIL/uL (ref 4.22–5.81)
RDW: 12.7 % (ref 11.5–15.5)
WBC: 7.5 10*3/uL (ref 4.0–10.5)
nRBC: 0 % (ref 0.0–0.2)

## 2022-03-24 LAB — BASIC METABOLIC PANEL
Anion gap: 8 (ref 5–15)
BUN: 16 mg/dL (ref 8–23)
CO2: 26 mmol/L (ref 22–32)
Calcium: 8.9 mg/dL (ref 8.9–10.3)
Chloride: 102 mmol/L (ref 98–111)
Creatinine, Ser: 1.02 mg/dL (ref 0.61–1.24)
GFR, Estimated: >60 mL/min (ref >60)
Glucose, Bld: 108 mg/dL — ABNORMAL HIGH (ref 70–99)
Potassium: 3.4 mmol/L — ABNORMAL LOW (ref 3.5–5.1)
Sodium: 136 mmol/L (ref 135–145)

## 2022-03-24 LAB — URINALYSIS, ROUTINE W REFLEX MICROSCOPIC
Bacteria, UA: NONE SEEN
Bilirubin Urine: NEGATIVE
Glucose, UA: NEGATIVE mg/dL
Hgb urine dipstick: NEGATIVE
Ketones, ur: NEGATIVE mg/dL
Leukocytes,Ua: NEGATIVE
Nitrite: NEGATIVE
Protein, ur: 30 mg/dL — AB
Specific Gravity, Urine: 1.011 (ref 1.005–1.030)
pH: 6 (ref 5.0–8.0)

## 2022-03-24 MED ORDER — GADOBUTROL 1 MMOL/ML IV SOLN
8.0000 mL | Freq: Once | INTRAVENOUS | Status: AC | PRN
  Administered 2022-03-24: 8 mL via INTRAVENOUS

## 2022-03-24 MED ORDER — BACITRACIN ZINC 500 UNIT/GM EX OINT
TOPICAL_OINTMENT | Freq: Once | CUTANEOUS | Status: AC
  Filled 2022-03-24: qty 0.9

## 2022-03-24 MED ORDER — LIDOCAINE-EPINEPHRINE 1 %-1:200000 IJ SOLN
10.0000 mL | Freq: Once | INTRAMUSCULAR | Status: AC
Start: 2022-03-24 — End: 2022-03-24
  Administered 2022-03-24: 10 mL
  Filled 2022-03-24: qty 10

## 2022-03-24 MED ORDER — ACETAMINOPHEN 325 MG PO TABS
650.0000 mg | ORAL_TABLET | Freq: Once | ORAL | Status: AC
  Administered 2022-03-24: 650 mg via ORAL
  Filled 2022-03-24: qty 2

## 2022-03-24 NOTE — ED Notes (Signed)
Sent urine on pt. Pt using urinal again at this time.

## 2022-03-24 NOTE — Consult Note (Signed)
Consulting Department:  Emergency department  Primary Physician:  [place]  Chief Complaint: Fall  History of Present Illness: 03/24/2022 Steven Elliott is a 88 y.o. male who presents with the chief complaint of an unwitnessed fall.  He does not remember what he was doing but did not remember hitting his head.  He denies any headache.  Denies any numbness tingling.  Denies any weakness.  Denies any neck pain.  Has not had any nausea or vomiting.  Denies any seizures.  Overall he states that he just had some face pain.  He is not on any anticoagulation medicine.  Review of Systems:  A 10 point review of systems is negative, except for the pertinent positives and negatives detailed in the HPI.  Past Medical History: Past Medical History:  Diagnosis Date   Allergic rhinitis    Aneurysm of infrarenal abdominal aorta ([place]) 02/2015   5.4 cm by CT 03/20/16.    Anosmia    BPH (benign prostatic hyperplasia)    COPD (chronic obstructive pulmonary disease) (HCC)    Diverticulosis    Emphysema (subcutaneous) (surgical) resulting from a procedure    severe by CT 03/20/16   GERD (gastroesophageal reflux disease)    History of bilateral inguinal hernias 02/2015   Hypertension    Peripheral vascular disease ([place]) 02/2015   heavily calcified but patent bil iliac arteries by CTA 03/20/16.    Past Surgical History: Past Surgical History:  Procedure Laterality Date   APPENDECTOMY     CHOLECYSTECTOMY     COLONOSCOPY  ~ 2007   at [facility] Left 2014    Allergies: Allergies as of 03/24/2022 - Review Complete 03/24/2022  Allergen Reaction Noted   Procaine Anaphylaxis 03/20/2016   Ciprofloxacin Other (See Comments) and Nausea And Vomiting 03/20/2016   Hydrocodone  09/30/2021   Hydrocodone-acetaminophen Itching 03/20/2016   Lisinopril Other (See Comments) 09/30/2021   Omeprazole Other (See Comments) 03/20/2016   Penicillins  Other (See Comments) 03/20/2016   Rabeprazole Other (See Comments) 03/20/2016    Medications: No current facility-administered medications for this encounter.  Current Outpatient Medications:    acetaminophen (TYLENOL) 500 MG tablet, Take 500 mg by mouth every 6 (six) hours as needed., Disp: , Rfl:    albuterol (PROVENTIL HFA;VENTOLIN HFA) 108 (90 Base) MCG/ACT inhaler, Inhale 2 puffs into the lungs every 6 (six) hours as needed for wheezing or shortness of breath., Disp: , Rfl:    amLODipine (NORVASC) 5 MG tablet, TAKE ONE TABLET BY MOUTH DAILY FOR BLOOD PRESSURE., Disp: , Rfl:    Calcium Carbonate-Vitamin D (CALCIUM-D) 600-400 MG-UNIT TABS, Take 1 tablet by mouth 2 (two) times daily., Disp: , Rfl:    cetirizine (ZYRTEC) 5 MG tablet, Take 1 tablet (5 mg total) by mouth daily., Disp: 30 tablet, Rfl: 0   cyclobenzaprine (FLEXERIL) 5 MG tablet, Take 5 mg by mouth 3 (three) times daily as needed for muscle spasms., Disp: , Rfl:    fluticasone (FLONASE) 50 MCG/ACT nasal spray, Place 1 spray into both nostrils daily., Disp: , Rfl:    gabapentin (NEURONTIN) 100 MG capsule, Take 200 mg by mouth at bedtime., Disp: , Rfl:    galantamine (RAZADYNE ER) 24 MG 24 hr capsule, Take 24 mg by mouth daily with breakfast., Disp: , Rfl:    hydrochlorothiazide (HYDRODIURIL) 25 MG tablet, Take 12.5 mg by mouth daily., Disp: , Rfl:    ipratropium (ATROVENT) 0.06 %  nasal spray, Place 2 sprays into both nostrils 2 (two) times daily., Disp: , Rfl:    ketoconazole (NIZORAL) 2 % shampoo, SHAMPOO AS DIRECTED AFFECTED AREA THREE TIMES A WEEK AS NEEDED (LEAVE ON AFFECTED AREA FOR 10 MINUTES, THEN RINSE OFF WITH WATER) MAY USE AS A FACE WASH DAILY TO SCALY SPOTS, Disp: , Rfl:    memantine (NAMENDA) 10 MG tablet, Take 10 mg by mouth 2 (two) times daily., Disp: , Rfl:    Multiple Vitamin (MULTIVITAMIN WITH MINERALS) TABS tablet, Take 1 tablet by mouth daily., Disp: , Rfl:    tamsulosin (FLOMAX) 0.4 MG CAPS capsule, Take 0.4 mg  by mouth at bedtime., Disp: , Rfl:    traZODone (DESYREL) 50 MG tablet, TAKE ONE TABLET BY MOUTH AT BEDTIME --MAY START WITH QUARTER TABLETS, MAY PROGRESS TO WHOLE TABLETS FOR INSOMNIA, Disp: , Rfl:    trospium (SANCTURA) 20 MG tablet, Take 1 tablet (20 mg total) by mouth 2 (two) times daily., Disp: 180 tablet, Rfl: 3   vitamin B-12 (CYANOCOBALAMIN) 500 MCG tablet, Take 1 tablet by mouth daily., Disp: , Rfl:    Social History: Social History   Tobacco Use   Smoking status: Never   Smokeless tobacco: Never  Substance Use Topics   Alcohol use: Yes    Comment: rare   Drug use: No    Family Medical History: Family History  Problem Relation Age of Onset   Breast cancer Mother    Pancreatitis Mother    Breast cancer Sister     Physical Examination: Vitals:   03/24/22 1533 03/24/22 1834  BP: 120/60 122/62  Pulse: 70 72  Resp: 16 16  Temp:    SpO2: 94% 94%     General: Patient is well developed, well nourished, calm, collected, and in no apparent distress.  NEUROLOGICAL:  General: In no acute distress.   Awake, alert, oriented to person, place, and time.  Pupils equal round and reactive to light.  He does have some left-sided facial trauma above the left eye.  Full Facial tone is symmetric.  Tongue protrusion is midline.  Bilateral upper extremities are full strength proximally and distally.  There is no pronator drift.  Language is conversant.  GCS: 15   Bilateral upper and lower extremity sensation is intact to light touch.  Imaging: Narrative & Impression  CLINICAL DATA:  Subdural hematoma.   EXAM: CT HEAD WITHOUT CONTRAST   TECHNIQUE: Contiguous axial images were obtained from the base of the skull through the vertex without intravenous contrast.   RADIATION DOSE REDUCTION: This exam was performed according to the departmental dose-optimization program which includes automated exposure control, adjustment of the mA and/or kV according to patient size and/or  use of iterative reconstruction technique.   COMPARISON:  Earlier same day   FINDINGS: Brain: The tiny left parietal subdural hematoma seen on CT and MR imaging earlier today is stable on image 18 of series 2 on the current study. No evidence for interval rebleeding. Diffuse loss of parenchymal volume is consistent with atrophy. Patchy low attenuation in the deep hemispheric and periventricular white matter is nonspecific, but likely reflects chronic microvascular ischemic demyelination.   Vascular: Stable appearance of the 1.1 cm peripherally calcified right MCA aneurysm (12/2).   Skull: No evidence for fracture. No worrisome lytic or sclerotic lesion.   Sinuses/Orbits: Chronic opacification of left sphenoid sinus again noted. Visualized portions of the globes and intraorbital fat are unremarkable.   Other: Probable left frontal scalp contusion  IMPRESSION: 1. Stable tiny left parietal subdural hematoma. No evidence for interval rebleeding. 2. Stable appearance of the 1.1 cm peripherally calcified right MCA aneurysm. 3. Atrophy with chronic small vessel white matter ischemic disease. 4. Left frontal scalp contusion. 5. Chronic left sphenoid sinusitis.     Electronically Signed   By: Misty Stanley M.D.   On: 03/24/2022 18:16      I have personally reviewed the images and agree with the above interpretation.  Labs:    Latest Ref Rng & Units 03/24/2022   11:27 AM 07/27/2017    2:18 PM 03/22/2016    4:41 AM  CBC  WBC 4.0 - 10.5 K/uL 7.5  9.0  6.2   Hemoglobin 13.0 - 17.0 g/dL 14.1  15.2  13.4   Hematocrit 39.0 - 52.0 % 42.0  45.1  40.4   Platelets 150 - 400 K/uL 171  206  172        Assessment and Plan: Steven Elliott is a pleasant 88 y.o. male with a recent fall.  He denies any weakness numbness or tingling.  Denies any head pain.  Denies any headache.  Denies any nausea or vomiting.  Has not had any seizures.  Overall he feels like he is at his baseline and his  family agrees.  On physical exam he has no deficits with a nonfocal neurologic examination.  He is fully alert.  He is not on any anticoagulation.  CT scan was stable with no interval changes.  Very small subdural hematoma without any expansion.  At this point is nonsurgical.  Does not require any antiseizure prophylaxis.  We will plan on having him follow-up in clinic.  He does have VA benefits and would prefer to follow in the [name] system if possible.  We will plan to arrange follow-up in the neurosurgery clinic as an outpatient.     Stevan Born, MD/MSCR Dept. of Neurosurgery

## 2022-03-24 NOTE — ED Provider Notes (Signed)
-----------------------------------------   6:44 PM on 03/24/2022 -----------------------------------------  I took over care on this patient from Dr. Archie Balboa and APP Vanessa Calumet.  The repeat CT shows no significant change in the size of the hemorrhage.  I discussed the case with Dr. Tamala Julian from neurosurgery who had already come to evaluate the patient.  He advises that the patient is stable for discharge home and should follow-up in 1 to 2 weeks.  On reassessment the patient is alert and appears comfortable.  I counseled the patient and family on the results of the scan and the follow-up plan.  I gave them thorough return precautions and they expressed understanding.   Arta Silence, MD 03/24/22 [phone]

## 2022-03-24 NOTE — ED Provider Notes (Signed)
[facility] Provider Note    Event Date/Time   First MD Initiated Contact with Patient 03/24/22 1037     (approximate)   History   Fall   HPI  Steven Elliott is a 88 y.o. male presents  to the emergency department for treatment and evaluation after fall at home this morning.  Patient is unsure what caused him to fall.  Fall was unwitnessed.  He fell forward and hit his head on a dehumidifier causing a laceration.  Patient denies loss of consciousness.  He denies pain except in his forehead.   Past Medical History:  Diagnosis Date  . Allergic rhinitis   . 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   [place] [facility] Anosmia   . BPH (benign prostatic hyperplasia)   . COPD (chronic obstructive pulmonary disease) ([place])   . Diverticulosis   . Emphysema (subcutaneous) (surgical) resulting from a procedure    severe by CT 03/20/16  . GERD (gastroesophageal reflux disease)   . History of bilateral inguinal hernias 02/2015  . Hypertension   . Peripheral vascular disease ([place]) 02/2015   heavily calcified but patent bil iliac arteries by CTA 03/20/16.     Physical Exam   Triage Vital Signs: ED Triage Vitals  Enc Vitals Group     BP 03/24/22 1027 (!) 153/75     Pulse Rate 03/24/22 1025 88     Resp 03/24/22 1025 18     Temp 03/24/22 1025 97.7 F (36.5 C)     Temp Source 03/24/22 1025 Oral     SpO2 03/24/22 1025 98 %     Weight 03/24/22 1027 177 lb 14.6 oz (80.7 kg)     Height 03/24/22 1027 '5\' 8"'$  (1.727 m)     Head Circumference --      Peak Flow --      Pain Score 03/24/22 1026 4     Pain Loc --      Pain Edu? --      Excl. in [place]? --     Most recent vital signs: Vitals:   03/24/22 1331 03/24/22 1533  BP: 124/62 120/60  Pulse: 77 70  Resp: 16 16  Temp: 97.7 F (36.5 C)   SpO2: 93% 94%    General: Awake, no distress.  CV:  Good peripheral perfusion.  Resp:  Normal effort.  Abd:  No distention.  Other:     ED  Results / Procedures / Treatments   Labs (all labs ordered are listed, but only abnormal results are displayed) Labs Reviewed  URINALYSIS, ROUTINE W REFLEX MICROSCOPIC - Abnormal; Notable for the following components:      Result Value   Color, Urine YELLOW (*)    APPearance CLEAR (*)    Protein, ur 30 (*)    All other components within normal limits  BASIC METABOLIC PANEL - Abnormal; Notable for the following components:   Potassium 3.4 (*)    Glucose, Bld 108 (*)    All other components within normal limits  CBC WITH DIFFERENTIAL/PLATELET     EKG  Sinus rhythm with first-degree AV block and right bundle branch block, rate of 83   RADIOLOGY  CT cervical spine negative for acute concerns.  CT head shows a 4 mm thick focus of soft tissue density over the left parietal lobe potentially reflecting a meningioma or small volume subdural blood.  MRI brain with and without contrast shows a 4 mm subdural  hematoma.  I have independently reviewed and interpreted imaging as well as reviewed report from radiology.  PROCEDURES:  Critical Care performed: No  ..Laceration Repair  Date/Time: 03/24/2022 3:11 PM  Performed by: Victorino Dike, FNP Authorized by: Victorino Dike, FNP   Consent:    Consent obtained:  Verbal   Consent given by:  Patient   Risks discussed:  Pain and poor cosmetic result Universal protocol:    Procedure explained and questions answered to patient or proxy's satisfaction: yes     Patient identity confirmed:  Verbally with patient Anesthesia:    Anesthesia method:  Local infiltration   Local anesthetic:  Lidocaine 1% WITH epi Laceration details:    Location:  Face   Face location:  Forehead   Length (cm):  4 Pre-procedure details:    Preparation:  Patient was prepped and draped in usual sterile fashion    MEDICATIONS ORDERED IN ED:  Medications  acetaminophen (TYLENOL) tablet 650 mg (650 mg Oral Given 03/24/22 1252)  lidocaine-EPINEPHrine (PF)  (XYLOCAINE-EPINEPHrine) 1 %-1:200000 (PF) injection 10 mL (10 mLs Infiltration Given 03/24/22 1252)  gadobutrol (GADAVIST) 1 MMOL/ML injection 8 mL (8 mLs Intravenous Contrast Given 03/24/22 1415)  bacitracin ointment ( Topical Given by Other 03/24/22 1532)     IMPRESSION / MDM / ASSESSMENT AND PLAN / ED COURSE   I reviewed the triage vital signs and the nursing notes.  Differential diagnosis includes, but is not limited to: Minor head injury, intracranial hemorrhage, subdural hematoma, cervical spine injury, cervical vertebral injury, forehead laceration, arrhythmia, lab abnormality, acute cystitis  Patient's presentation is most consistent with acute presentation with potential threat to life or bodily function. 88 year old male presenting to the emergency department for treatment and evaluation after an unwitnessed fall at home.  See HPI for further details.  Family at bedside who reported that patient is at baseline regarding mental status.  Laceration to the forehead above the left eyebrow with bleeding well controlled.  Plan will be to get labs, EKG, urinalysis, and CT of the head and cervical spine.  Patient and family aware and agreeable to the plan.  Imaging results discussed with radiologist. CT concerning for a small 4 mm density that could potentially be a meningioma or a subdural hematoma.  Plan will be to get an MRI of the brain with and without contrast.  Wound repaired as described above.  Patient tolerated procedure well.  Wound care discussed.  Clinical Course as of 03/24/22 1541  Tue Mar 24, 2022  1504 Call received from radiologist. Subdural hemorrhage measuring about 20m noted on MR of the brain. Discussed with on call neurosurgeon, Dr. STamala Julian who recommends repeat CT between 5pm and 6pm. He will come see him after the repeat scan. Patient remains neurologically intact. Results and plan discussed with the patient and family. [CT]  1522 Patient care relinquished to Dr. SCherylann Banasat  end of shift who will follow up on repeat CT.  [CT]    Clinical Course User Index [CT] Giara Mcgaughey B, FNP   CRITICAL CARE Performed by: CSherrie George  Total critical care time: 30 minutes  Critical care time was exclusive of separately billable procedures and treating other patients.  Critical care was necessary to treat or prevent imminent or life-threatening deterioration.  Critical care was time spent personally by me on the following activities: development of treatment plan with patient and/or surrogate as well as nursing, discussions with consultants, evaluation of patient's response to treatment, examination of patient, obtaining  history from patient or surrogate, ordering and performing treatments and interventions, ordering and review of laboratory studies, ordering and review of radiographic studies, pulse oximetry and re-evaluation of patient's condition.   FINAL CLINICAL IMPRESSION(S) / ED DIAGNOSES   Final diagnoses:  Traumatic subdural hemorrhage with unknown loss of consciousness status, initial encounter ([place])  Laceration of forehead, initial encounter  Skin tear of elbow without complication, left, initial encounter     Rx / DC Orders   ED Discharge Orders     None        Note:  This document was prepared using Dragon voice recognition software and may include unintentional dictation errors.   Victorino Dike, FNP 03/24/22 1541    Arta Silence, MD 03/24/22 1926

## 2022-03-24 NOTE — ED Notes (Signed)
Pt to MRI

## 2022-03-24 NOTE — ED Triage Notes (Signed)
Pt here via [facility] from home with a fall this morning after loosing his balance. Pt his hit head on a humidifier, lac to left of head. No LOC or blood thinners.Pt has chronic back pain.   166/71 94% RA 88

## 2022-03-24 NOTE — ED Notes (Signed)
Pt to CT and back.

## 2022-03-24 NOTE — Discharge Instructions (Signed)
Follow-up with Dr. Tamala Julian or with a neurosurgeon through the [facility] within the next 1 to 2 weeks.  Return to the ER immediately for new, worsening, or persistent severe headache, confusion or change in mental status, vomiting, strokelike symptoms such as weakness or numbness, or any other new or worsening symptoms that concern you.

## 2022-03-24 NOTE — ED Notes (Signed)
Pt assisted to [facility] and instructed to call for help when finished. Pt verbalized understanding and was given call bell.

## 2022-04-10 ENCOUNTER — Telehealth: Payer: Self-pay

## 2022-04-10 DIAGNOSIS — S065X0D Traumatic subdural hemorrhage without loss of consciousness, subsequent encounter: Secondary | ICD-10-CM

## 2022-04-10 NOTE — Telephone Encounter (Signed)
Left message to call back  

## 2022-04-10 NOTE — Telephone Encounter (Signed)
Noted  

## 2022-04-10 NOTE — Telephone Encounter (Signed)
Patient was seen in the ER for subdural hemorrhage. He will need a repeat scan in about 3-4 weeks with a appointment with Andee Poles afterwards. Please notify the patient of this, and also tell him he will be getting a call from the hospital to schedule his repeat CT.   Order for the CT has been placed.

## 2022-04-10 NOTE — Telephone Encounter (Signed)
Patient has [employer] and will be seen at the [name] [facility] [place] per [name] his daughter.

## 2022-04-23 ENCOUNTER — Emergency Department
Admission: EM | Admit: 2022-04-23 | Discharge: 2022-04-24 | Disposition: A | Discharge status: Other Acute Inpatient Hospital | Payer: No Typology Code available for payment source | Attending: Emergency Medicine | Admitting: Emergency Medicine

## 2022-04-23 ENCOUNTER — Emergency Department: Payer: No Typology Code available for payment source

## 2022-04-23 ENCOUNTER — Other Ambulatory Visit: Payer: Self-pay

## 2022-04-23 ENCOUNTER — Encounter: Payer: Self-pay | Admitting: Intensive Care

## 2022-04-23 DIAGNOSIS — K668 Other specified disorders of peritoneum: Secondary | ICD-10-CM

## 2022-04-23 DIAGNOSIS — R197 Diarrhea, unspecified: Secondary | ICD-10-CM | POA: Insufficient documentation

## 2022-04-23 DIAGNOSIS — I10 Essential (primary) hypertension: Secondary | ICD-10-CM | POA: Diagnosis not present

## 2022-04-23 DIAGNOSIS — F0390 Unspecified dementia without behavioral disturbance: Secondary | ICD-10-CM | POA: Insufficient documentation

## 2022-04-23 DIAGNOSIS — J449 Chronic obstructive pulmonary disease, unspecified: Secondary | ICD-10-CM | POA: Diagnosis not present

## 2022-04-23 LAB — COMPREHENSIVE METABOLIC PANEL
ALT: 16 U/L (ref 0–44)
AST: 28 U/L (ref 15–41)
Albumin: 4 g/dL (ref 3.5–5.0)
Alkaline Phosphatase: 85 U/L (ref 38–126)
Anion gap: 10 (ref 5–15)
BUN: 15 mg/dL (ref 8–23)
CO2: 27 mmol/L (ref 22–32)
Calcium: 9.1 mg/dL (ref 8.9–10.3)
Chloride: 99 mmol/L (ref 98–111)
Creatinine, Ser: 1.31 mg/dL — ABNORMAL HIGH (ref 0.61–1.24)
GFR, Estimated: 52 mL/min — ABNORMAL LOW (ref >60)
Glucose, Bld: 113 mg/dL — ABNORMAL HIGH (ref 70–99)
Potassium: 3.7 mmol/L (ref 3.5–5.1)
Sodium: 136 mmol/L (ref 135–145)
Total Bilirubin: 1.2 mg/dL (ref 0.3–1.2)
Total Protein: 7.6 g/dL (ref 6.5–8.1)

## 2022-04-23 LAB — URINALYSIS, ROUTINE W REFLEX MICROSCOPIC
Bilirubin Urine: NEGATIVE
Glucose, UA: NEGATIVE mg/dL
Hgb urine dipstick: NEGATIVE
Ketones, ur: NEGATIVE mg/dL
Leukocytes,Ua: NEGATIVE
Nitrite: NEGATIVE
Protein, ur: NEGATIVE mg/dL
Specific Gravity, Urine: 1.009 (ref 1.005–1.030)
pH: 7 (ref 5.0–8.0)

## 2022-04-23 LAB — CBC
HCT: 40.9 % (ref 39.0–52.0)
Hemoglobin: 13.5 g/dL (ref 13.0–17.0)
MCH: 31.2 pg (ref 26.0–34.0)
MCHC: 33 g/dL (ref 30.0–36.0)
MCV: 94.5 fL (ref 80.0–100.0)
Platelets: 191 10*3/uL (ref 150–400)
RBC: 4.33 MIL/uL (ref 4.22–5.81)
RDW: 12.9 % (ref 11.5–15.5)
WBC: 7.7 10*3/uL (ref 4.0–10.5)
nRBC: 0 % (ref 0.0–0.2)

## 2022-04-23 LAB — TYPE AND SCREEN
ABO/RH(D): B POS
Antibody Screen: NEGATIVE

## 2022-04-23 LAB — GASTROINTESTINAL PANEL BY PCR, STOOL (REPLACES STOOL CULTURE)

## 2022-04-23 LAB — LIPASE, BLOOD: Lipase: 28 U/L (ref 11–51)

## 2022-04-23 LAB — OCCULT BLOOD X 1 CARD TO LAB, STOOL: Fecal Occult Bld: NEGATIVE

## 2022-04-23 MED ORDER — METRONIDAZOLE 500 MG/100ML IV SOLN
500.0000 mg | Freq: Once | INTRAVENOUS | Status: AC
  Administered 2022-04-23: 500 mg via INTRAVENOUS
  Filled 2022-04-23: qty 100

## 2022-04-23 MED ORDER — PIPERACILLIN-TAZOBACTAM 3.375 G IVPB 30 MIN
3.3750 g | Freq: Once | INTRAVENOUS | Status: AC
  Administered 2022-04-23: 3.375 g via INTRAVENOUS
  Filled 2022-04-23: qty 50

## 2022-04-23 NOTE — ED Notes (Signed)
This RN now assuming care of pt. Report received.

## 2022-04-23 NOTE — ED Provider Notes (Signed)
-----------------------------------------   11:50 PM on 04/23/2022 -----------------------------------------  Assuming care from Dr. Kerman Passey.  In short, ISMEAL HEIDER is a 88 y.o. male with a chief complaint of diarrhea w/ blood.  Refer to the original H&P for additional details.  The current plan of care is to transfer to the [name] if a bed is available.   Clinical Course as of 04/24/22 0101  Fri Apr 24, 2022  0059 Consulted by phone with Dr. Tor Netters in the [facility] ED. we discussed the case in detail.  He checked and verified that they can accept the patient.  The patient will be an ED to ED transfer.  He can go by a Proofreader service (BLS).  I updated the patient and family.  The patient is stable, awake, alert, and in no distress at this time. [CF]    Clinical Course User Index [CF] Hinda Kehr, MD     Medications  metroNIDAZOLE (FLAGYL) IVPB 500 mg (0 mg Intravenous Stopped 04/23/22 1952)  piperacillin-tazobactam (ZOSYN) IVPB 3.375 g (0 g Intravenous Stopped 04/23/22 2254)     ED Discharge Orders     None      Final diagnoses:  Pneumoperitoneum  Diarrhea, unspecified type     Hinda Kehr, MD 04/24/22 0101

## 2022-04-23 NOTE — ED Provider Notes (Addendum)
[name]    Event Date/Time   First MD Initiated Contact with Patient 04/23/22 1641     (approximate)  History   Chief Complaint: Diarrhea and Blood In Stools  HPI  Steven Elliott is a 88 y.o. male with a past medical history of COPD, gastric reflux, hypertension, dementia, presents emergency department for diarrhea.  According to the daughter who lives with the patient for the past 1 month patient has been experiencing frequent episodes of loose stool.  She states this morning patient had an episode of diarrhea in the bed.  She states when she cleaned it up she thought she saw blood in the diarrhea which concerned her and prompted today's visit.  [facility]'s [facility] but they have not been able to set an appointment yet and they were told today to go to the emergency department.  Here the patient is awake alert he has no complaints.  He denies any abdominal pain, daughter denies any complaints of abdominal pain.  No vomiting.  Physical Exam   Triage Vital Signs: ED Triage Vitals  Enc Vitals Group     BP 04/23/22 1507 (!) 126/58     Pulse Rate 04/23/22 1507 86     Resp 04/23/22 1507 18     Temp 04/23/22 1507 98 F (36.7 C)     Temp Source 04/23/22 1507 Oral     SpO2 04/23/22 1507 96 %     Weight 04/23/22 1510 171 lb (77.6 kg)     Height 04/23/22 1510 '5\' 8"'$  (1.727 m)     Head Circumference --      Peak Flow --      Pain Score 04/23/22 1510 0     Pain Loc --      Pain Edu? --      Excl. in [place]? --     Most recent vital signs: Vitals:   04/23/22 1507  BP: (!) 126/58  Pulse: 86  Resp: 18  Temp: 98 F (36.7 C)  SpO2: 96%    General: Awake, no distress.  CV:  Good peripheral perfusion.  Regular rate and rhythm  Resp:  Normal effort.  Equal breath sounds bilaterally.  Abd:  No distention.  Soft, nontender.  No rebound or guarding.    ED Results /  Procedures / Treatments   RADIOLOGY  I have reviewed and interpreted the CT scan which is consistent with free air. Radiology has called confirming free air within the abdomen as well as prostate enlargement question oncologic cause for   [name] ED: Medications - No data to display   IMPRESSION / MDM / Melbeta / ED COURSE  I reviewed the triage vital signs and the nursing notes.  Patient's presentation is most consistent with acute presentation with potential threat to life or bodily function.  Patient presents emergency department for 1 month of worsening diarrhea now with concern for blood in the diarrhea.  Stool here today appears normal, guaiac negative.  However given 1 month of diarrhea possible intermittent blood we will proceed with a bio fire stool panel to rule out significant or infectious causes of the diarrhea.  Patient's lab work is reassuring including normal CBC, reassuring chemistry, normal urinalysis.  Given the patient's intermittent diarrhea we will obtain CT imaging of the abdomen to help evaluate for things like colitis, diverticulitis, partial bowel obstruction, mass or tumor.  Differential would also include medication induced diarrhea in addition to bacterial or viral diarrhea.  If the CT scan is negative I believe the patient could be discharged home with PCP follow-up.  Awaiting bio fire GI panel results.  Patient will follow-up with his PCP regarding this.  GI panel is negative.  Occult blood negative.  Radiologist is called me regarding the patient CT showing large amount of free air.  I have reviewed the CT images personally as well and on my interpretation I see a considerable amount of free air as well.  Start patient on IV Flagyl.  Patient with multiple antibiotic allergies.  Patient continues to be hemodynamically stable continues to deny any abdominal pain.  Patient's labs are reassuring.  I spoke to Dr. Hampton Abbot of general surgery  who will be down to see the patient to discuss surgical option given the emergent condition and need for emergency surgery.  I spoke to the daughter and patient regarding this and they are agreeable to proceed with surgery.  They are very concerned as he is a [name] patient regarding billing.  I was able to speak to the PA intern as well as attending surgeon who stated they would of course except the patient if he were transferred over but given the emergent condition as the patient is currently at our hospital but the capability to perform the surgery we both agreed that it would be best to have the surgery performed here or to avoid any time delay given the patient's emergent condition.  I spoke to patient and daughter regarding this they are agreeable and we will proceed with surgery at [name].  I spoke to Dr. Hampton Abbot will be down shortly to see the patient.    Dr. Hampton Abbot has had a long conversation with the patient and family.  As the patient appears overall very well with a benign abdomen reassuring vitals reassuring lab work and no fever Dr. Hampton Abbot does not believe patient needs emergent surgery as the patient is very high risk.  He has spoken to the patient regarding options and they have opted to hold off on surgery at the moment to proceed with antibiotic treatment alone for now.  Flagyl and Zosyn has been ordered for the patient.  As the patient is no longer going to an emergent operation family would like the patient transferred to the [name] for insurance/billing purposes.  We will attempt to transfer the patient to the [name].  Should the patient decompensate we will be evaluated for admission locally  Awaiting callback from the [name].  Patient care signed out to oncoming provider  CRITICAL CARE Performed by: [name]   Total critical care time: 30 minutes  Critical care time was exclusive of separately billable procedures and treating other patients.  Critical care was  necessary to treat or prevent imminent or life-threatening deterioration.  Critical care was time spent personally by me on the following activities: development of treatment plan with patient and/or surrogate as well as nursing, discussions with consultants, evaluation of patient's response to treatment, examination of patient, obtaining history from patient or surrogate, ordering and performing treatments and interventions, ordering and review of laboratory studies, ordering and review of radiographic studies, pulse oximetry and re-evaluation of patient's condition.   FINAL CLINICAL IMPRESSION(S) / ED DIAGNOSES   Diarrhea Pneumoperitoneum   Note:  This document was prepared using Dragon voice recognition software and may include unintentional dictation errors.   [name], MD 04/23/22 [phone]  [name], MD 04/23/22 2112    [name], MD 04/23/22 [phone]

## 2022-04-23 NOTE — ED Triage Notes (Signed)
Patient presents with diarrhea for a few months and blood in stool noted today by daughter. Daughter reports she stays with her dad a lot and helps care for him. HX memory issues

## 2022-04-23 NOTE — ED Notes (Signed)
Stool sample collected and hemoccult

## 2022-04-23 NOTE — Consult Note (Signed)
Date of Consultation:  04/23/2022  Requesting Physician:  [name], MD  Reason for Consultation:  Pneumoperitoneum  History of Present Illness: Steven Elliott is a 88 y.o. male presenting for evaluation of diarrhea and possible GI bleed.  The patient has dementia and his family at bedside provide the history.  He has had diarrhea issues for the last 1-2 months, and today he had incontinence of diarrhea and was noted to have some blood appearance to it.  He was brought to the ER for further management.  In the ED, his laboratory workup was unremarkable, with normal WBC of 7.7, mild Cr elevation of 1.31, though we do not have baseline labs in our system.  He was afebrile, with normal and stable vital signs.  He denies any abdominal pain, nausea, or vomiting, fevers, chills, chest pain, shortness of breath.  He does have history of COPD which apparently is severe based on prior CT scan imaging, but he does not use oxygen at home.  He does have a recent history of a fall at home, with resulting small left parietal subdural hematoma on CT scan and MRI on 03/24/22.    In the ED, he had a hemoccult which was negative.  He also had a CT scan of abdomen and pelvis and this actually shows some extensive pneumoperitoneum.  However, there are no inflammatory changes around his abdomen, and unclear of the source, and also unclear of the timing.  However, the patient again does not have any symptoms.  Past Medical History: Past Medical History:  Diagnosis Date   Allergic rhinitis    Aneurysm of infrarenal abdominal aorta ([place]) 02/2015   5.4 cm by CT 03/20/16.    Anosmia    BPH (benign prostatic hyperplasia)    COPD (chronic obstructive pulmonary disease) (HCC)    Diverticulosis    Emphysema (subcutaneous) (surgical) resulting from a procedure    severe by CT 03/20/16   GERD (gastroesophageal reflux disease)    History of bilateral inguinal hernias 02/2015   Hypertension    Peripheral vascular  disease ([place]) 02/2015   heavily calcified but patent bil iliac arteries by CTA 03/20/16.     Past Surgical History: Past Surgical History:  Procedure Laterality Date   APPENDECTOMY     CHOLECYSTECTOMY     COLONOSCOPY  ~ 2007   at [place] Left 2014    Home Medications: Prior to Admission medications   Medication Sig Start Date End Date Taking? Authorizing Provider  acetaminophen (TYLENOL) 500 MG tablet Take 500 mg by mouth every 6 (six) hours as needed.    [provider]  albuterol (PROVENTIL HFA;VENTOLIN HFA) 108 (90 Base) MCG/ACT inhaler Inhale 2 puffs into the lungs every 6 (six) hours as needed for wheezing or shortness of breath.    [provider]  amLODipine (NORVASC) 5 MG tablet TAKE ONE TABLET BY MOUTH DAILY FOR BLOOD PRESSURE. 01/26/22   [provider]  Calcium Carbonate-Vitamin D (CALCIUM-D) 600-400 MG-UNIT TABS Take 1 tablet by mouth 2 (two) times daily.    [provider]  cetirizine (ZYRTEC) 5 MG tablet Take 1 tablet (5 mg total) by mouth daily. 09/13/20   Jaynee Eagles, PA-C  cyclobenzaprine (FLEXERIL) 5 MG tablet Take 5 mg by mouth 3 (three) times daily as needed for muscle spasms.    [provider]  fluticasone (FLONASE) 50 MCG/ACT nasal spray Place 1 spray into both nostrils  daily.    [provider]  gabapentin (NEURONTIN) 100 MG capsule Take 200 mg by mouth at bedtime.    [provider]  galantamine (RAZADYNE ER) 24 MG 24 hr capsule Take 24 mg by mouth daily with breakfast.    [provider]  hydrochlorothiazide (HYDRODIURIL) 25 MG tablet Take 12.5 mg by mouth daily.    [provider]  ipratropium (ATROVENT) 0.06 % nasal spray Place 2 sprays into both nostrils 2 (two) times daily.    [provider]  ketoconazole (NIZORAL) 2 % shampoo SHAMPOO AS DIRECTED AFFECTED AREA THREE TIMES A WEEK AS NEEDED (LEAVE ON AFFECTED AREA FOR 10  MINUTES, THEN RINSE OFF WITH WATER) MAY USE AS A FACE [place] DAILY TO SCALY SPOTS 03/02/22   [provider]  memantine (NAMENDA) 10 MG tablet Take 10 mg by mouth 2 (two) times daily.    [provider]  Multiple Vitamin (MULTIVITAMIN WITH MINERALS) TABS tablet Take 1 tablet by mouth daily.    [provider]  tamsulosin (FLOMAX) 0.4 MG CAPS capsule Take 0.4 mg by mouth at bedtime.    [provider]  traZODone (DESYREL) 50 MG tablet TAKE ONE TABLET BY MOUTH AT BEDTIME --MAY START WITH QUARTER TABLETS, MAY PROGRESS TO WHOLE TABLETS FOR INSOMNIA 02/18/22   [provider]  trospium (SANCTURA) 20 MG tablet Take 1 tablet (20 mg total) by mouth 2 (two) times daily. 11/24/21   Zara Council A, PA-C  vitamin B-12 (CYANOCOBALAMIN) 500 MCG tablet Take 1 tablet by mouth daily. 11/06/21   [provider]    Allergies: Allergies  Allergen Reactions   Procaine Anaphylaxis   Ciprofloxacin Other (See Comments) and Nausea And Vomiting    Malaise, Rigor   Hydrocodone     Other reaction(s): Rash, Hives, Heart palpitations ()   Hydrocodone-Acetaminophen Itching    Watery eyes   Lisinopril Other (See Comments)   Omeprazole Other (See Comments)    GI reaction   Penicillins Other (See Comments)   Rabeprazole Other (See Comments)    GI reaction    Social History:  reports that he has quit smoking. His smoking use included cigarettes and cigars. He has never used smokeless tobacco. He reports current alcohol use. He reports that he does not use drugs.   Family History: Family History  Problem Relation Age of Onset   Breast cancer Mother    Pancreatitis Mother    Breast cancer Sister     Review of Systems: Review of Systems  Constitutional:  Negative for chills and fever.  HENT:  Negative for hearing loss.   Respiratory:  Negative for shortness of breath.   Cardiovascular:  Negative for chest pain.  Gastrointestinal:  Positive for blood in stool  and diarrhea. Negative for abdominal pain, nausea and vomiting.  Genitourinary:  Negative for dysuria.  Musculoskeletal:  Negative for myalgias.  Skin:  Negative for rash.  Neurological:  Negative for dizziness.  Psychiatric/Behavioral:  Negative for depression.     Physical Exam BP 138/62   Pulse 84   Temp 98.6 F (37 C) (Oral)   Resp 16   Ht '5\' 8"'$  (1.727 m)   Wt 77.6 kg   SpO2 97%   BMI 26.00 kg/m  CONSTITUTIONAL: No acute distress, well nourished. HEENT:  Normocephalic, atraumatic, extraocular motion intact. NECK: Trachea is midline, and there is no jugular venous distension. RESPIRATORY:  Normal respiratory effort without pathologic use of accessory muscles. CARDIOVASCULAR: Regular rhythm and rate GI: The  abdomen is soft, non-distended, non-tender to palpation in any quadrant or pelvis.  No peritonitis.  MUSCULOSKELETAL:  Normal muscle strength and tone in all four extremities.  No peripheral edema or cyanosis. SKIN: Skin turgor is normal. There are no pathologic skin lesions.  NEUROLOGIC:  Motor and sensation is grossly normal.  Cranial nerves are grossly intact.   Laboratory Analysis: Results for orders placed or performed during the hospital encounter of 04/23/22 (from the past 24 hour(s))  Lipase, blood     Status: None   Collection Time: 04/23/22  3:15 PM  Result Value Ref Range   Lipase 28 11 - 51 U/L  Comprehensive metabolic panel     Status: Abnormal   Collection Time: 04/23/22  3:15 PM  Result Value Ref Range   Sodium 136 135 - 145 mmol/L   Potassium 3.7 3.5 - 5.1 mmol/L   Chloride 99 98 - 111 mmol/L   CO2 27 22 - 32 mmol/L   Glucose, Bld 113 (H) 70 - 99 mg/dL   BUN 15 8 - 23 mg/dL   Creatinine, Ser 1.31 (H) 0.61 - 1.24 mg/dL   Calcium 9.1 8.9 - 10.3 mg/dL   Total Protein 7.6 6.5 - 8.1 g/dL   Albumin 4.0 3.5 - 5.0 g/dL   AST 28 15 - 41 U/L   ALT 16 0 - 44 U/L   Alkaline Phosphatase 85 38 - 126 U/L   Total Bilirubin 1.2 0.3 - 1.2 mg/dL   GFR, Estimated  52 (L) >60 mL/min   Anion gap 10 5 - 15  CBC     Status: None   Collection Time: 04/23/22  3:15 PM  Result Value Ref Range   WBC 7.7 4.0 - 10.5 K/uL   RBC 4.33 4.22 - 5.81 MIL/uL   Hemoglobin 13.5 13.0 - 17.0 g/dL   HCT 40.9 39.0 - 52.0 %   MCV 94.5 80.0 - 100.0 fL   MCH 31.2 26.0 - 34.0 pg   MCHC 33.0 30.0 - 36.0 g/dL   RDW 12.9 11.5 - 15.5 %   Platelets 191 150 - 400 K/uL   nRBC 0.0 0.0 - 0.2 %  Type and screen [place]     Status: None   Collection Time: 04/23/22  3:15 PM  Result Value Ref Range   ABO/RH(D) B POS    Antibody Screen NEG    Sample Expiration      04/26/2022,2359 Performed at [facility], [address] [place], [place] [address]   Urinalysis, Routine w reflex microscopic     Status: Abnormal   Collection Time: 04/23/22  4:00 PM  Result Value Ref Range   Color, Urine YELLOW (A) YELLOW   APPearance HAZY (A) CLEAR   Specific Gravity, Urine 1.009 1.005 - 1.030   pH 7.0 5.0 - 8.0   Glucose, UA NEGATIVE NEGATIVE mg/dL   Hgb urine dipstick NEGATIVE NEGATIVE   Bilirubin Urine NEGATIVE NEGATIVE   Ketones, ur NEGATIVE NEGATIVE mg/dL   Protein, ur NEGATIVE NEGATIVE mg/dL   Nitrite NEGATIVE NEGATIVE   Leukocytes,Ua NEGATIVE NEGATIVE  Occult blood card to lab, stool RN will collect     Status: None   Collection Time: 04/23/22  4:43 PM  Result Value Ref Range   Fecal Occult Bld NEGATIVE NEGATIVE  Gastrointestinal Panel by PCR , Stool     Status: None   Collection Time: 04/23/22  4:59 PM   Specimen: Stool  Result Value Ref Range   Campylobacter species  NOT DETECTED NOT DETECTED   Plesimonas shigelloides NOT DETECTED NOT DETECTED   Salmonella species NOT DETECTED NOT DETECTED   Yersinia enterocolitica NOT DETECTED NOT DETECTED   Vibrio species NOT DETECTED NOT DETECTED   Vibrio cholerae NOT DETECTED NOT DETECTED   Enteroaggregative E coli (EAEC) NOT DETECTED NOT DETECTED   Enteropathogenic E coli (EPEC) NOT DETECTED NOT  DETECTED   Enterotoxigenic E coli (ETEC) NOT DETECTED NOT DETECTED   Shiga like toxin producing E coli (STEC) NOT DETECTED NOT DETECTED   Shigella/Enteroinvasive E coli (EIEC) NOT DETECTED NOT DETECTED   Cryptosporidium NOT DETECTED NOT DETECTED   Cyclospora cayetanensis NOT DETECTED NOT DETECTED   Entamoeba histolytica NOT DETECTED NOT DETECTED   Giardia lamblia NOT DETECTED NOT DETECTED   Adenovirus F40/41 NOT DETECTED NOT DETECTED   Astrovirus NOT DETECTED NOT DETECTED   Norovirus GI/GII NOT DETECTED NOT DETECTED   Rotavirus A NOT DETECTED NOT DETECTED   Sapovirus (I, II, IV, and V) NOT DETECTED NOT DETECTED    Imaging: CT ABDOMEN PELVIS WO CONTRAST  Result Date: 04/23/2022 CLINICAL DATA:  Diarrhea and bloody stool for several months. EXAM: CT ABDOMEN AND PELVIS WITHOUT CONTRAST TECHNIQUE: Multidetector CT imaging of the abdomen and pelvis was performed following the standard protocol without IV contrast. RADIATION DOSE REDUCTION: This exam was performed according to the departmental dose-optimization program which includes automated exposure control, adjustment of the mA and/or kV according to patient size and/or use of iterative reconstruction technique. COMPARISON:  March 20, 2016 FINDINGS: Lower chest: Emphysematous lung disease is seen within the bilateral lung bases. Hepatobiliary: An 11 mm cystic appearing focus is seen within the anterior aspect of the right lobe of the liver. Status post cholecystectomy. No biliary dilatation. Pancreas: Unremarkable. No pancreatic ductal dilatation or surrounding inflammatory changes. Spleen: Normal in size without focal abnormality. Adrenals/Urinary Tract: Adrenal glands are unremarkable. Kidneys are normal in size, without focal lesions. There is bilateral moderate to marked severity hydronephrosis and hydroureter, right greater than left. No obstructing renal calculi are identified. The urinary bladder is moderately distended, with asymmetric bladder  wall thickening seen along the inferolateral aspect of the urinary bladder on the left (axial CT images 59 through 63). Stomach/Bowel: Stomach is within normal limits. The appendix is not visualized. No evidence of bowel dilatation. Numerous diverticula are seen within the descending and sigmoid colon. A large amount of peri-intestinal free air is seen, most prominent within the mid and upper abdomen. Vascular/Lymphatic: Stent graft repair of an infrarenal abdominal aortic aneurysm is noted. A 3.6 cm x 2.9 cm lymph node is seen along the posterior right iliac chain (axial CT image 54, CT series 2). Reproductive: The prostate gland is markedly enlarged. Mass effect is seen along the base of the urinary bladder. The posterior border of the prostate gland is poorly defined, with heterogeneous soft tissue extending into the posterior aspect of the adjacent portion of the pelvis (axial CT images 56 through 65, CT series 2). The seminal vesicles are also enlarged and heterogeneous in appearance. Other: There is an 8.4 cm x 4.6 cm fat containing right inguinal hernia. A 6.4 cm x 5.0 cm left inguinal hernia is also noted. This contains fat and a small amount of fluid. A mild amount of inflammatory fat stranding is also seen. No abdominopelvic ascites Musculoskeletal: Multilevel degenerative changes are seen throughout the lumbar spine. IMPRESSION: 1. Large amount of peri-intestinal free air, most prominent within the mid and upper abdomen, consistent with bowel perforation. 2. Markedly  enlarged prostate gland with additional findings concerning for prostate cancer. Correlation with PSA levels is recommended. 3. Asymmetric urinary bladder wall thickening, as described above, which is also concerning for an underlying neoplastic process. Further evaluation with urinalysis is recommended to exclude acute cystitis. 4. Colonic diverticulosis. 5. Bilateral fat-containing inguinal hernias, as described above. 6. Infrarenal  abdominal aortic aneurysm stent graft placement. 7. Emphysematous lung disease. 8. Evidence of prior cholecystectomy. Emphysema (ICD10-J43.9). Electronically Signed   By: Virgina Norfolk M.D.   On: 04/23/2022 17:45    Assessment and Plan: This is a 88 y.o. male with pneumoperitoneum  --Discussed with the patient and family about the findings on his labs and his CT scan.  It is a very confounding CT scan in that his clinical picture is very benign with normal vitals, normal labs, and no abdominal pain, but a CT scan that does have a significant amount of pneumoperitoneum.  Given his age, comorbidities, recent subdural hematoma, I think that since the clinical picture is so intact, I would not recommend proceeding with surgery.  Discussed with the family that surgery would involve a midline incision, evaluating all the bowel, possible bowel resection, possible ostomy creation, and that he would have a higher risk of pneumonia based on his COPD, and there's a risk of the subdural hematoma worsening as well. --Discussed that instead, my recommendation would be to admit to hospital, hospitalist service given his medical issues, for observation and IV antibiotic therapy.  Would recommend keeping him NPO, with IV fluid hydration, and IV antibiotics.  Would be doing serial abdominal exams, repeating labs, checking vitals to see if there is any deterioration.  If there are any worsening changes, then it may be that surgery needs to be considered.  However, again discussed with the family the possible risks and the question would be how truly aggressive should they be with the treatment plan. --For now, no emergent or urgent surgical needs.  The patient is a patient of the [name].  He is very stable at this point, and at least from the surgical standpoint, he would be appropriate for transfer if needed.  I spent 80 minutes dedicated to the care of this patient on the date of this encounter to include pre-visit review  of records, face-to-face time with the patient discussing diagnosis and management, and any post-visit coordination of care.   Melvyn Neth, MD [facility] Pg:  [phone]

## 2022-04-24 NOTE — ED Notes (Signed)
ED Provider at bedside. 

## 2022-04-24 NOTE — ED Notes (Signed)
EMS at bedside

## 2022-04-24 NOTE — ED Notes (Signed)
EMTALA reviewed by this RN.  Transfer consent signed. ?

## 2022-05-04 ENCOUNTER — Emergency Department
Admission: EM | Admit: 2022-05-04 | Discharge: 2022-05-04 | Disposition: A | Discharge status: Home / Self Care | Payer: No Typology Code available for payment source | Attending: Emergency Medicine | Admitting: Emergency Medicine

## 2022-05-04 ENCOUNTER — Encounter: Payer: Self-pay | Admitting: Emergency Medicine

## 2022-05-04 DIAGNOSIS — J449 Chronic obstructive pulmonary disease, unspecified: Secondary | ICD-10-CM | POA: Diagnosis not present

## 2022-05-04 DIAGNOSIS — N4889 Other specified disorders of penis: Secondary | ICD-10-CM | POA: Diagnosis not present

## 2022-05-04 DIAGNOSIS — N481 Balanitis: Secondary | ICD-10-CM

## 2022-05-04 DIAGNOSIS — Z8546 Personal history of malignant neoplasm of prostate: Secondary | ICD-10-CM | POA: Insufficient documentation

## 2022-05-04 DIAGNOSIS — I10 Essential (primary) hypertension: Secondary | ICD-10-CM | POA: Insufficient documentation

## 2022-05-04 LAB — URINALYSIS, ROUTINE W REFLEX MICROSCOPIC
Bilirubin Urine: NEGATIVE
Glucose, UA: NEGATIVE mg/dL
Ketones, ur: NEGATIVE mg/dL
Nitrite: NEGATIVE
Protein, ur: 100 mg/dL — AB
Specific Gravity, Urine: 1.014 (ref 1.005–1.030)
pH: 8 (ref 5.0–8.0)

## 2022-05-04 LAB — CBC WITH DIFFERENTIAL/PLATELET
Abs Immature Granulocytes: 0.05 10*3/uL (ref 0.00–0.07)
Basophils Absolute: 0 10*3/uL (ref 0.0–0.1)
Basophils Relative: 0 %
Eosinophils Absolute: 0.2 10*3/uL (ref 0.0–0.5)
Eosinophils Relative: 2 %
HCT: 42.4 % (ref 39.0–52.0)
Hemoglobin: 13.7 g/dL (ref 13.0–17.0)
Immature Granulocytes: 1 %
Lymphocytes Relative: 18 %
Lymphs Abs: 1.7 10*3/uL (ref 0.7–4.0)
MCH: 31.4 pg (ref 26.0–34.0)
MCHC: 32.3 g/dL (ref 30.0–36.0)
MCV: 97.2 fL (ref 80.0–100.0)
Monocytes Absolute: 0.9 10*3/uL (ref 0.1–1.0)
Monocytes Relative: 9 %
Neutro Abs: 6.8 10*3/uL (ref 1.7–7.7)
Neutrophils Relative %: 70 %
Platelets: 324 10*3/uL (ref 150–400)
RBC: 4.36 MIL/uL (ref 4.22–5.81)
RDW: 13 % (ref 11.5–15.5)
WBC: 9.6 10*3/uL (ref 4.0–10.5)
nRBC: 0 % (ref 0.0–0.2)

## 2022-05-04 LAB — BASIC METABOLIC PANEL
Anion gap: 9 (ref 5–15)
BUN: 15 mg/dL (ref 8–23)
CO2: 27 mmol/L (ref 22–32)
Calcium: 9 mg/dL (ref 8.9–10.3)
Chloride: 102 mmol/L (ref 98–111)
Creatinine, Ser: 1.11 mg/dL (ref 0.61–1.24)
GFR, Estimated: >60 mL/min (ref >60)
Glucose, Bld: 116 mg/dL — ABNORMAL HIGH (ref 70–99)
Potassium: 4.1 mmol/L (ref 3.5–5.1)
Sodium: 138 mmol/L (ref 135–145)

## 2022-05-04 MED ORDER — CLOTRIMAZOLE 1 % EX CREA: 1.0000 | TOPICAL_CREAM | Freq: Two times a day (BID) | CUTANEOUS | 0 refills | Status: DC

## 2022-05-04 MED ORDER — CLOTRIMAZOLE 1 % EX CREA
TOPICAL_CREAM | Freq: Two times a day (BID) | CUTANEOUS | Status: DC
  Filled 2022-05-04: qty 15

## 2022-05-04 MED ORDER — CEPHALEXIN 500 MG PO CAPS: 500.0000 mg | ORAL_CAPSULE | Freq: Four times a day (QID) | ORAL | 0 refills | Status: DC

## 2022-05-04 MED ORDER — CEPHALEXIN 500 MG PO CAPS
500.0000 mg | ORAL_CAPSULE | Freq: Four times a day (QID) | ORAL | 0 refills | Status: DC
Start: 2022-05-04 — End: 2022-05-22

## 2022-05-04 MED ORDER — CEPHALEXIN 500 MG PO CAPS
500.0000 mg | ORAL_CAPSULE | Freq: Once | ORAL | Status: AC
Start: 2022-05-04 — End: 2022-05-04
  Administered 2022-05-04: 500 mg via ORAL
  Filled 2022-05-04: qty 1

## 2022-05-04 MED ORDER — CEPHALEXIN 500 MG PO CAPS
500.0000 mg | ORAL_CAPSULE | Freq: Four times a day (QID) | ORAL | 0 refills | Status: DC
Start: 2022-05-04 — End: 2022-05-04

## 2022-05-04 NOTE — Discharge Instructions (Addendum)
Please take the antibiotic 4 times a day for the next 7 days and also use the topical antifungal twice a day on the area.  Please avoid using any other soaps or products that could cause an allergic reaction.  Please call your urologist

## 2022-05-04 NOTE — ED Provider Triage Note (Signed)
Emergency Medicine Provider Triage Evaluation Note  Steven Elliott , a 88 y.o. male  was evaluated in triage.  Pt complains of metastatic prostate cancer here with penile swelling. Has catheter in place still draining. .  Review of Systems  Positive: Penile swelling Negative: Abd pain  Physical Exam  There were no vitals taken for this visit. Gen:   Awake, no distress   Resp:  Normal effort  MSK:   Moves extremities without difficulty  Other:    Medical Decision Making  Medically screening exam initiated at 7:21 PM.  Appropriate orders placed.  Steven Elliott was informed that the remainder of the evaluation will be completed by another provider, this initial triage assessment does not replace that evaluation, and the importance of remaining in the ED until their evaluation is complete.     Marquette Old, PA-C 05/04/22 1924

## 2022-05-04 NOTE — ED Triage Notes (Signed)
Pt arrived with family due to swelling of the penis [facility]. Per home-health nurse who seen pt today, catheter was placed without foreskin pulled back over penis after catheter was placed into the pts uncircumcised penis. Foley catheter draining per assessment.   Pt with recent diagnosis of cancer in abdomen, bone and prostate.

## 2022-05-04 NOTE — ED Provider Notes (Signed)
[facility] Provider Note    Event Date/Time   First MD Initiated Contact with Patient 05/04/22 1934     (approximate)   History   Groin Pain   HPI  Steven Elliott is a 88 y.o. male   with multiple chronic comorbidities including metastatic prostate cancer presents with penile swelling and pain.  Patient had Foley catheter placed at the [name] on Monday 1 week ago.  On Saturday several days ago family started noticing swelling of the glans penis.  Home nurse came out today and told him to come to the emergency department.  There is a note from the home nurses says patient was uncircumcised and the foreskin was retracted for the procedure and not replaced.  However patient tells me he is circumcised.  Has not had fevers.     Past Medical History:  Diagnosis Date   Allergic rhinitis    Aneurysm of infrarenal abdominal aorta ([place]) 02/2015   5.4 cm by CT 03/20/16.    Anosmia    BPH (benign prostatic hyperplasia)    COPD (chronic obstructive pulmonary disease) (HCC)    Diverticulosis    Emphysema (subcutaneous) (surgical) resulting from a procedure    severe by CT 03/20/16   GERD (gastroesophageal reflux disease)    History of bilateral inguinal hernias 02/2015   Hypertension    Peripheral vascular disease ([place]) 02/2015   heavily calcified but patent bil iliac arteries by CTA 03/20/16.    Patient Active Problem List   Diagnosis Date Noted   Pneumoperitoneum    Diverticular hemorrhage 03/22/2016   Abdominal aortic aneurysm ([place]) 03/22/2016   Rectal bleeding 03/20/2016     Physical Exam  Triage Vital Signs: ED Triage Vitals [05/04/22 1924]  Enc Vitals Group     BP      Pulse      Resp      Temp      Temp src      SpO2      Weight 171 lb 1.2 oz (77.6 kg)     Height '5\' 8"'$  (1.727 m)     Head Circumference      Peak Flow      Pain Score      Pain Loc      Pain Edu?      Excl. in [place]?     Most recent vital signs: There were no vitals filed  for this visit.   General: Awake, no distress.  CV:  Good peripheral perfusion.  Resp:  Normal effort.  Abd:  No distention.  Neuro:             Awake, Alert, Oriented x 3  Other:  Patient is circumcised, glans penis is significantly swollen, mildly tender and erythematous, there is some scant penile discharge, no fluctuance or asymmetry, no crepitus, scrotum is nontender none erythematous no testicular tenderness   ED Results / Procedures / Treatments  Labs (all labs ordered are listed, but only abnormal results are displayed) Labs Reviewed  CBC WITH DIFFERENTIAL/PLATELET  BASIC METABOLIC PANEL  URINALYSIS, ROUTINE W REFLEX MICROSCOPIC     EKG     RADIOLOGY    PROCEDURES:  Critical Care performed: No  Procedures     MEDICATIONS ORDERED IN ED: Medications - No data to display   IMPRESSION / MDM / [place] / ED COURSE  I reviewed the triage vital signs and the nursing notes.  Patient's presentation is most consistent with acute complicated illness / injury requiring diagnostic workup.  Differential diagnosis includes, but is not limited to, balanitis, cellulitis, inflammatory reaction secondary to Foley catheter, exam not consistent with necrotizing fasciitis or Fournier's gangrene  Patient is a 43-year-old male with complex past medical history including metastatic prostate cancer who has a Foley catheter in place that has been in place for about 1 week he has had swelling of the glans penis for several days with associated pain no fevers Foley catheter is functioning fine.  Home nurse was concerned about a paraphimosis as there is a note from her that says that patient is uncircumcised and had foreskin retracted for the Foley catheter placement that was not then replaced however on my exam patient is circumcised that this is not a paraphimosis.  The glans is significantly swollen and is mildly tender and red and there is  discharge around the urethra.  Exam is not consistent with Fournier's gangrene there is no scrotal involvement no involvement of the perineum.  No fluctuance of the glans to suggest abscess.  Overall I suspect balanitis although this is bacterial or candidal given severity will cover for both with cephalexin and topical clotrimazole.       FINAL CLINICAL IMPRESSION(S) / ED DIAGNOSES   Final diagnoses:  None     Rx / DC Orders   ED Discharge Orders     None        Note:  This document was prepared using Dragon voice recognition software and may include unintentional dictation errors.   [name], MD 05/04/22 2007

## 2022-05-10 ENCOUNTER — Encounter: Payer: Self-pay | Admitting: Emergency Medicine

## 2022-05-10 ENCOUNTER — Other Ambulatory Visit: Payer: Self-pay

## 2022-05-10 ENCOUNTER — Emergency Department: Payer: No Typology Code available for payment source

## 2022-05-10 ENCOUNTER — Inpatient Hospital Stay
Admission: EM | Admit: 2022-05-10 | Discharge: 2022-05-22 | DRG: 723 | Disposition: A | Discharge status: Skilled Nursing Facility | Payer: No Typology Code available for payment source | Attending: Internal Medicine | Admitting: Internal Medicine

## 2022-05-10 DIAGNOSIS — N401 Enlarged prostate with lower urinary tract symptoms: Secondary | ICD-10-CM | POA: Diagnosis present

## 2022-05-10 DIAGNOSIS — J439 Emphysema, unspecified: Secondary | ICD-10-CM | POA: Diagnosis present

## 2022-05-10 DIAGNOSIS — R338 Other retention of urine: Secondary | ICD-10-CM

## 2022-05-10 DIAGNOSIS — Z885 Allergy status to narcotic agent status: Secondary | ICD-10-CM

## 2022-05-10 DIAGNOSIS — T82868A Thrombosis of vascular prosthetic devices, implants and grafts, initial encounter: Secondary | ICD-10-CM | POA: Diagnosis not present

## 2022-05-10 DIAGNOSIS — Z880 Allergy status to penicillin: Secondary | ICD-10-CM

## 2022-05-10 DIAGNOSIS — T3695XA Adverse effect of unspecified systemic antibiotic, initial encounter: Secondary | ICD-10-CM | POA: Diagnosis present

## 2022-05-10 DIAGNOSIS — R310 Gross hematuria: Secondary | ICD-10-CM | POA: Diagnosis not present

## 2022-05-10 DIAGNOSIS — E860 Dehydration: Secondary | ICD-10-CM | POA: Diagnosis present

## 2022-05-10 DIAGNOSIS — N3000 Acute cystitis without hematuria: Secondary | ICD-10-CM | POA: Diagnosis not present

## 2022-05-10 DIAGNOSIS — I2510 Atherosclerotic heart disease of native coronary artery without angina pectoris: Secondary | ICD-10-CM | POA: Diagnosis present

## 2022-05-10 DIAGNOSIS — C775 Secondary and unspecified malignant neoplasm of intrapelvic lymph nodes: Secondary | ICD-10-CM | POA: Diagnosis present

## 2022-05-10 DIAGNOSIS — C7951 Secondary malignant neoplasm of bone: Secondary | ICD-10-CM | POA: Diagnosis present

## 2022-05-10 DIAGNOSIS — Z9049 Acquired absence of other specified parts of digestive tract: Secondary | ICD-10-CM

## 2022-05-10 DIAGNOSIS — N179 Acute kidney failure, unspecified: Secondary | ICD-10-CM

## 2022-05-10 DIAGNOSIS — E871 Hypo-osmolality and hyponatremia: Secondary | ICD-10-CM

## 2022-05-10 DIAGNOSIS — R197 Diarrhea, unspecified: Secondary | ICD-10-CM | POA: Diagnosis present

## 2022-05-10 DIAGNOSIS — Z888 Allergy status to other drugs, medicaments and biological substances status: Secondary | ICD-10-CM

## 2022-05-10 DIAGNOSIS — F0390 Unspecified dementia without behavioral disturbance: Secondary | ICD-10-CM | POA: Diagnosis present

## 2022-05-10 DIAGNOSIS — N4889 Other specified disorders of penis: Secondary | ICD-10-CM | POA: Diagnosis present

## 2022-05-10 DIAGNOSIS — Z96652 Presence of left artificial knee joint: Secondary | ICD-10-CM | POA: Diagnosis present

## 2022-05-10 DIAGNOSIS — N481 Balanitis: Secondary | ICD-10-CM | POA: Diagnosis not present

## 2022-05-10 DIAGNOSIS — Z79899 Other long term (current) drug therapy: Secondary | ICD-10-CM

## 2022-05-10 DIAGNOSIS — N390 Urinary tract infection, site not specified: Secondary | ICD-10-CM | POA: Diagnosis not present

## 2022-05-10 DIAGNOSIS — N1831 Chronic kidney disease, stage 3a: Secondary | ICD-10-CM | POA: Diagnosis present

## 2022-05-10 DIAGNOSIS — R39198 Other difficulties with micturition: Secondary | ICD-10-CM | POA: Diagnosis not present

## 2022-05-10 DIAGNOSIS — I129 Hypertensive chronic kidney disease with stage 1 through stage 4 chronic kidney disease, or unspecified chronic kidney disease: Secondary | ICD-10-CM | POA: Diagnosis present

## 2022-05-10 DIAGNOSIS — N342 Other urethritis: Secondary | ICD-10-CM | POA: Diagnosis present

## 2022-05-10 DIAGNOSIS — Z87440 Personal history of urinary (tract) infections: Secondary | ICD-10-CM

## 2022-05-10 DIAGNOSIS — J449 Chronic obstructive pulmonary disease, unspecified: Secondary | ICD-10-CM | POA: Diagnosis present

## 2022-05-10 DIAGNOSIS — T83098A Other mechanical complication of other indwelling urethral catheter, initial encounter: Secondary | ICD-10-CM | POA: Diagnosis not present

## 2022-05-10 DIAGNOSIS — C61 Malignant neoplasm of prostate: Principal | ICD-10-CM

## 2022-05-10 DIAGNOSIS — Z87891 Personal history of nicotine dependence: Secondary | ICD-10-CM

## 2022-05-10 DIAGNOSIS — I708 Atherosclerosis of other arteries: Secondary | ICD-10-CM | POA: Diagnosis present

## 2022-05-10 DIAGNOSIS — Y846 Urinary catheterization as the cause of abnormal reaction of the patient, or of later complication, without mention of misadventure at the time of the procedure: Secondary | ICD-10-CM | POA: Diagnosis not present

## 2022-05-10 DIAGNOSIS — R7989 Other specified abnormal findings of blood chemistry: Secondary | ICD-10-CM | POA: Diagnosis present

## 2022-05-10 DIAGNOSIS — N400 Enlarged prostate without lower urinary tract symptoms: Secondary | ICD-10-CM | POA: Diagnosis present

## 2022-05-10 DIAGNOSIS — K521 Toxic gastroenteritis and colitis: Secondary | ICD-10-CM

## 2022-05-10 DIAGNOSIS — I10 Essential (primary) hypertension: Secondary | ICD-10-CM | POA: Diagnosis present

## 2022-05-10 DIAGNOSIS — Z881 Allergy status to other antibiotic agents status: Secondary | ICD-10-CM

## 2022-05-10 DIAGNOSIS — Z85828 Personal history of other malignant neoplasm of skin: Secondary | ICD-10-CM

## 2022-05-10 DIAGNOSIS — I951 Orthostatic hypotension: Secondary | ICD-10-CM

## 2022-05-10 DIAGNOSIS — D630 Anemia in neoplastic disease: Secondary | ICD-10-CM | POA: Diagnosis present

## 2022-05-10 DIAGNOSIS — R062 Wheezing: Secondary | ICD-10-CM | POA: Diagnosis not present

## 2022-05-10 DIAGNOSIS — N3001 Acute cystitis with hematuria: Secondary | ICD-10-CM | POA: Diagnosis not present

## 2022-05-10 DIAGNOSIS — N131 Hydronephrosis with ureteral stricture, not elsewhere classified: Secondary | ICD-10-CM | POA: Diagnosis present

## 2022-05-10 DIAGNOSIS — Z803 Family history of malignant neoplasm of breast: Secondary | ICD-10-CM

## 2022-05-10 LAB — COMPREHENSIVE METABOLIC PANEL
ALT: 81 U/L — ABNORMAL HIGH (ref 0–44)
AST: 152 U/L — ABNORMAL HIGH (ref 15–41)
Albumin: 3.7 g/dL (ref 3.5–5.0)
Alkaline Phosphatase: 253 U/L — ABNORMAL HIGH (ref 38–126)
Anion gap: 8 (ref 5–15)
BUN: 18 mg/dL (ref 8–23)
CO2: 24 mmol/L (ref 22–32)
Calcium: 8.6 mg/dL — ABNORMAL LOW (ref 8.9–10.3)
Chloride: 103 mmol/L (ref 98–111)
Creatinine, Ser: 1.17 mg/dL (ref 0.61–1.24)
GFR, Estimated: 59 mL/min — ABNORMAL LOW (ref >60)
Glucose, Bld: 93 mg/dL (ref 70–99)
Potassium: 4.2 mmol/L (ref 3.5–5.1)
Sodium: 135 mmol/L (ref 135–145)
Total Bilirubin: 1.1 mg/dL (ref 0.3–1.2)
Total Protein: 7.7 g/dL (ref 6.5–8.1)

## 2022-05-10 LAB — URINALYSIS, ROUTINE W REFLEX MICROSCOPIC
Bilirubin Urine: NEGATIVE
Glucose, UA: NEGATIVE mg/dL
Ketones, ur: NEGATIVE mg/dL
Nitrite: NEGATIVE
Protein, ur: 100 mg/dL — AB
RBC / HPF: >50 RBC/hpf — ABNORMAL HIGH (ref 0–5)
Specific Gravity, Urine: 1.015 (ref 1.005–1.030)
WBC, UA: >50 WBC/hpf — ABNORMAL HIGH (ref 0–5)
pH: 5 (ref 5.0–8.0)

## 2022-05-10 LAB — GASTROINTESTINAL PANEL BY PCR, STOOL (REPLACES STOOL CULTURE)

## 2022-05-10 LAB — CBC
HCT: 40.4 % (ref 39.0–52.0)
Hemoglobin: 13.1 g/dL (ref 13.0–17.0)
MCH: 31.6 pg (ref 26.0–34.0)
MCHC: 32.4 g/dL (ref 30.0–36.0)
MCV: 97.6 fL (ref 80.0–100.0)
Platelets: 299 10*3/uL (ref 150–400)
RBC: 4.14 MIL/uL — ABNORMAL LOW (ref 4.22–5.81)
RDW: 12.9 % (ref 11.5–15.5)
WBC: 11.5 10*3/uL — ABNORMAL HIGH (ref 4.0–10.5)
nRBC: 0 % (ref 0.0–0.2)

## 2022-05-10 LAB — LACTIC ACID, PLASMA
Lactic Acid, Venous: 0.9 mmol/L (ref 0.5–1.9)
Lactic Acid, Venous: 1.5 mmol/L (ref 0.5–1.9)

## 2022-05-10 LAB — C DIFFICILE QUICK SCREEN W PCR REFLEX
C Diff antigen: NEGATIVE
C Diff interpretation: NOT DETECTED
C Diff toxin: NEGATIVE

## 2022-05-10 LAB — LIPASE, BLOOD: Lipase: 26 U/L (ref 11–51)

## 2022-05-10 MED ORDER — IBUPROFEN 400 MG PO TABS
200.0000 mg | ORAL_TABLET | Freq: Four times a day (QID) | ORAL | Status: DC | PRN
  Administered 2022-05-11: 200 mg via ORAL
  Filled 2022-05-10: qty 1

## 2022-05-10 MED ORDER — MEMANTINE HCL 5 MG PO TABS
10.0000 mg | ORAL_TABLET | Freq: Two times a day (BID) | ORAL | Status: DC
  Administered 2022-05-10 – 2022-05-22 (×24): 10 mg via ORAL
  Filled 2022-05-10 (×24): qty 2

## 2022-05-10 MED ORDER — FLUCONAZOLE 100 MG PO TABS
200.0000 mg | ORAL_TABLET | Freq: Every day | ORAL | Status: DC
Start: 2022-05-11 — End: 2022-05-15
  Administered 2022-05-11 – 2022-05-15 (×5): 200 mg via ORAL
  Filled 2022-05-10 (×5): qty 2

## 2022-05-10 MED ORDER — DULOXETINE HCL 20 MG PO CPEP
20.0000 mg | ORAL_CAPSULE | Freq: Two times a day (BID) | ORAL | Status: DC
  Administered 2022-05-10 – 2022-05-22 (×24): 20 mg via ORAL
  Filled 2022-05-10 (×25): qty 1

## 2022-05-10 MED ORDER — GABAPENTIN 100 MG PO CAPS
100.0000 mg | ORAL_CAPSULE | Freq: Every day | ORAL | Status: DC
  Administered 2022-05-10 – 2022-05-21 (×12): 100 mg via ORAL
  Filled 2022-05-10 (×12): qty 1

## 2022-05-10 MED ORDER — IOHEXOL 300 MG/ML  SOLN
80.0000 mL | Freq: Once | INTRAMUSCULAR | Status: AC | PRN
  Administered 2022-05-10: 80 mL via INTRAVENOUS

## 2022-05-10 MED ORDER — HYDRALAZINE HCL 20 MG/ML IJ SOLN
5.0000 mg | INTRAMUSCULAR | Status: DC | PRN
Start: 2022-05-10 — End: 2022-05-22

## 2022-05-10 MED ORDER — SODIUM CHLORIDE 0.9 % IV SOLN
2.0000 g | Freq: Three times a day (TID) | INTRAVENOUS | Status: DC
  Administered 2022-05-10 – 2022-05-15 (×14): 2 g via INTRAVENOUS
  Filled 2022-05-10: qty 10
  Filled 2022-05-10 (×2): qty 2
  Filled 2022-05-10: qty 10
  Filled 2022-05-10: qty 2
  Filled 2022-05-10 (×3): qty 10
  Filled 2022-05-10 (×3): qty 2
  Filled 2022-05-10 (×5): qty 10

## 2022-05-10 MED ORDER — LIDOCAINE HCL URETHRAL/MUCOSAL 2 % EX GEL
1.0000 | Freq: Once | CUTANEOUS | Status: AC
  Administered 2022-05-10: 1 via URETHRAL
  Filled 2022-05-10: qty 10

## 2022-05-10 MED ORDER — VITAMIN B-12 1000 MCG PO TABS
500.0000 ug | ORAL_TABLET | Freq: Every day | ORAL | Status: DC
  Administered 2022-05-10 – 2022-05-22 (×13): 500 ug via ORAL
  Filled 2022-05-10 (×13): qty 1

## 2022-05-10 MED ORDER — ENOXAPARIN SODIUM 40 MG/0.4ML IJ SOSY
40.0000 mg | PREFILLED_SYRINGE | INTRAMUSCULAR | Status: DC
  Administered 2022-05-11: 40 mg via SUBCUTANEOUS
  Filled 2022-05-10: qty 0.4

## 2022-05-10 MED ORDER — AMLODIPINE BESYLATE 5 MG PO TABS
5.0000 mg | ORAL_TABLET | Freq: Every day | ORAL | Status: DC
Start: 2022-05-11 — End: 2022-05-20
  Administered 2022-05-11 – 2022-05-20 (×9): 5 mg via ORAL
  Filled 2022-05-10 (×10): qty 1

## 2022-05-10 MED ORDER — TAMSULOSIN HCL 0.4 MG PO CAPS
0.4000 mg | ORAL_CAPSULE | Freq: Every day | ORAL | Status: DC
  Administered 2022-05-10 – 2022-05-21 (×12): 0.4 mg via ORAL
  Filled 2022-05-10 (×12): qty 1

## 2022-05-10 MED ORDER — ALBUTEROL SULFATE (2.5 MG/3ML) 0.083% IN NEBU: 3.0000 mL | INHALATION_SOLUTION | RESPIRATORY_TRACT | Status: DC | PRN

## 2022-05-10 MED ORDER — MAGNESIUM OXIDE -MG SUPPLEMENT 400 (240 MG) MG PO TABS
400.0000 mg | ORAL_TABLET | Freq: Every day | ORAL | Status: DC
  Administered 2022-05-10 – 2022-05-22 (×13): 400 mg via ORAL
  Filled 2022-05-10 (×13): qty 1

## 2022-05-10 MED ORDER — SODIUM CHLORIDE 0.9 % IV BOLUS
1000.0000 mL | Freq: Once | INTRAVENOUS | Status: AC
  Administered 2022-05-10: 1000 mL via INTRAVENOUS

## 2022-05-10 MED ORDER — LORATADINE 10 MG PO TABS
10.0000 mg | ORAL_TABLET | Freq: Every day | ORAL | Status: DC
  Administered 2022-05-10 – 2022-05-22 (×13): 10 mg via ORAL
  Filled 2022-05-10 (×13): qty 1

## 2022-05-10 MED ORDER — OYSTER SHELL CALCIUM/D3 500-5 MG-MCG PO TABS
1.0000 | ORAL_TABLET | Freq: Two times a day (BID) | ORAL | Status: DC
  Administered 2022-05-10 – 2022-05-22 (×24): 1 via ORAL
  Filled 2022-05-10 (×24): qty 1

## 2022-05-10 MED ORDER — CYCLOBENZAPRINE HCL 10 MG PO TABS
5.0000 mg | ORAL_TABLET | Freq: Three times a day (TID) | ORAL | Status: DC | PRN
  Administered 2022-05-13 – 2022-05-15 (×3): 5 mg via ORAL
  Filled 2022-05-10 (×3): qty 1

## 2022-05-10 MED ORDER — BICALUTAMIDE 50 MG PO TABS
50.0000 mg | ORAL_TABLET | Freq: Every day | ORAL | Status: DC
  Administered 2022-05-11 – 2022-05-22 (×12): 50 mg via ORAL
  Filled 2022-05-10 (×12): qty 1

## 2022-05-10 MED ORDER — GALANTAMINE HYDROBROMIDE ER 8 MG PO CP24
16.0000 mg | ORAL_CAPSULE | Freq: Every day | ORAL | Status: DC
  Administered 2022-05-11 – 2022-05-22 (×12): 16 mg via ORAL
  Filled 2022-05-10 (×12): qty 2

## 2022-05-10 MED ORDER — TRAZODONE HCL 50 MG PO TABS
50.0000 mg | ORAL_TABLET | Freq: Every day | ORAL | Status: DC
  Administered 2022-05-10 – 2022-05-21 (×12): 50 mg via ORAL
  Filled 2022-05-10 (×13): qty 1

## 2022-05-10 MED ORDER — FLUCONAZOLE 100 MG PO TABS
200.0000 mg | ORAL_TABLET | Freq: Once | ORAL | Status: AC
Start: 2022-05-10 — End: 2022-05-10
  Administered 2022-05-10: 200 mg via ORAL
  Filled 2022-05-10: qty 2

## 2022-05-10 MED ORDER — ONDANSETRON HCL 4 MG/2ML IJ SOLN: 4.0000 mg | Freq: Three times a day (TID) | INTRAMUSCULAR | Status: DC | PRN

## 2022-05-10 MED ORDER — LOPERAMIDE HCL 2 MG PO CAPS: 2.0000 mg | ORAL_CAPSULE | Freq: Two times a day (BID) | ORAL | Status: DC | PRN

## 2022-05-10 MED ORDER — DM-GUAIFENESIN ER 30-600 MG PO TB12: 1.0000 | ORAL_TABLET | Freq: Two times a day (BID) | ORAL | Status: DC | PRN

## 2022-05-10 MED ORDER — ADULT MULTIVITAMIN W/MINERALS CH
1.0000 | ORAL_TABLET | Freq: Every day | ORAL | Status: DC
  Administered 2022-05-10 – 2022-05-22 (×13): 1 via ORAL
  Filled 2022-05-10 (×13): qty 1

## 2022-05-10 MED ORDER — SODIUM CHLORIDE 0.9 % IV SOLN
2.0000 g | Freq: Once | INTRAVENOUS | Status: DC
  Filled 2022-05-10: qty 12.5

## 2022-05-10 NOTE — Assessment & Plan Note (Addendum)
AST trending from 152 down to 23 (normal range).  ALT 81 down to 18 (normal range).  Bilirubin normal range.  Alkaline phosphatase 253 on presentation and down to 90 (normal range)

## 2022-05-10 NOTE — ED Notes (Signed)
Advised nurse that patient has ready bed 

## 2022-05-10 NOTE — Consult Note (Signed)
PHARMACY -  BRIEF ANTIBIOTIC NOTE   Pharmacy has received consult(s) for cefepime from an ED provider.  The patient's profile has been reviewed for ht/wt/allergies/indication/available labs.    One time order(s) placed for cefepime  Further antibiotics/pharmacy consults should be ordered by admitting physician if indicated.                       Thank you,  Gretel Acre, PharmD PGY1 Pharmacy Resident 05/10/2022 3:11 PM

## 2022-05-10 NOTE — Assessment & Plan Note (Addendum)
Patient has urinary retention -S/p of Foley catheter.  Patient will need a voiding trial as outpatient with urology team. -Flomax -If unable to remove Foley catheter will have to be changed every month.

## 2022-05-10 NOTE — Assessment & Plan Note (Addendum)
Has negative C. difficile and GI panel

## 2022-05-10 NOTE — ED Notes (Signed)
Called to follow-up on bed at [facility] for patient was advised by DON at [facility] that they didn"t have any beds

## 2022-05-10 NOTE — ED Triage Notes (Signed)
PT reports diarrhea x 3 days. Pt started antibiotics on Monday for "swollen penis around his catheter."

## 2022-05-10 NOTE — Consult Note (Signed)
Pharmacy Antibiotic Note  Steven Elliott is a 88 y.o. male admitted on 05/10/2022 with  ongoing diarrhea and penile pain .  Patient was diagnosed with metastatic prostate cancer and has indwelling foley catheter. He was recently evaluated for balanitis and placed on cephalexin for treatment. Pharmacy has been consulted for Aztreonam dosing.   Of note: patient has stated to physician that he has a severe allergy and refused to be treated with Cefepime(although it looks like he received a dose of Zosyn on 8/3). Therefore, will continue with Aztreonam currently.  Plan: Aztreonam 2g IV Q8 hours     Temp (24hrs), Avg:97.8 F (36.6 C), Min:97.5 F (36.4 C), Max:98 F (36.7 C)  Recent Labs  Lab 05/04/22 1926 05/10/22 1153 05/10/22 1234  WBC 9.6 11.5*  --   CREATININE 1.11 1.17  --   LATICACIDVEN  --   --  0.9    Estimated Creatinine Clearance: 40.6 mL/min (by C-G formula based on SCr of 1.17 mg/dL).    Allergies  Allergen Reactions   Procaine Anaphylaxis   Ciprofloxacin Other (See Comments) and Nausea And Vomiting    Malaise, Rigor   Hydrocodone     Other reaction(s): Rash, Hives, Heart palpitations ()   Hydrocodone-Acetaminophen Itching    Watery eyes   Lisinopril Other (See Comments)   Omeprazole Other (See Comments)    GI reaction   Penicillins Other (See Comments)   Rabeprazole Other (See Comments)    GI reaction    Antimicrobials this admission: Aztreonam 8/20 >>    Dose adjustments this admission: N/A  Microbiology results: 8/20 UCx: collected  8/20 GI panel: negative  8/20 Cdiff quick scan: negative  Thank you for allowing pharmacy to be a part of this patient's care.  Pearla Dubonnet 05/10/2022 5:53 PM

## 2022-05-10 NOTE — ED Provider Notes (Signed)
[facility] Provider Note    Event Date/Time   First MD Initiated Contact with Patient 05/10/22 1137     (approximate)   History   Diarrhea   HPI  Steven Elliott is a 88 y.o. male with extensive past medical history including recent history of prostate cancer diagnosed at the [name] with urinary retention currently with indwelling Foley, recent evaluation and treatment for balanitis, here with ongoing diarrhea, penile pain.  The patient was just seen and started on antibiotics and treatment for balanitis.  He reportedly has had persistent and worsening penile pain and drainage around his Foley catheter since then.  He is also had increasingly severe watery diarrhea.  He has been generally weak.  Family has had a very difficult time getting control of this.  Of note, however, the patient has had fairly chronic diarrhea for several months.  This is actually what initially got him in and evaluated for his prostate which was recently found to be cancerous.  He has known bone metastases.  He denies any complaints on my assessment beyond pain around his catheter.     Physical Exam   Triage Vital Signs: ED Triage Vitals [05/10/22 1126]  Enc Vitals Group     BP (!) 112/48     Pulse Rate 87     Resp 17     Temp 98 F (36.7 C)     Temp Source Oral     SpO2 98 %     Weight      Height      Head Circumference      Peak Flow      Pain Score 0     Pain Loc      Pain Edu?      Excl. in [place]?     Most recent vital signs: Vitals:   05/10/22 1400 05/10/22 1430  BP: 119/61 107/80  Pulse: 77 80  Resp: 17 16  Temp:    SpO2: 93% 94%     General: Awake, no distress.  CV:  Good peripheral perfusion.  Resp:  Normal effort.  Lungs clear. Abd:  No distention.  Moderate suprapubic tenderness.  No rebound or guarding. Other:  Significant penile edema, warmth, and skin breakdown.  No purulence.  No extension to the scrotum or perineum.   ED Results / Procedures /  Treatments   Labs (all labs ordered are listed, but only abnormal results are displayed) Labs Reviewed  COMPREHENSIVE METABOLIC PANEL - Abnormal; Notable for the following components:      Result Value   Calcium 8.6 (*)    AST 152 (*)    ALT 81 (*)    Alkaline Phosphatase 253 (*)    GFR, Estimated 59 (*)    All other components within normal limits  CBC - Abnormal; Notable for the following components:   WBC 11.5 (*)    RBC 4.14 (*)    All other components within normal limits  URINALYSIS, ROUTINE W REFLEX MICROSCOPIC - Abnormal; Notable for the following components:   Color, Urine YELLOW (*)    APPearance HAZY (*)    Hgb urine dipstick LARGE (*)    Protein, ur 100 (*)    Leukocytes,Ua LARGE (*)    RBC / HPF >50 (*)    WBC, UA >50 (*)    Bacteria, UA RARE (*)    All other components within normal limits  GASTROINTESTINAL PANEL BY PCR, STOOL (REPLACES STOOL CULTURE)  C DIFFICILE  QUICK SCREEN W PCR REFLEX    URINE CULTURE  LIPASE, BLOOD  LACTIC ACID, PLASMA  LACTIC ACID, PLASMA     EKG    RADIOLOGY CT ab/pelvis: Metastatic prostate cancer, severely thickened urinary bladder, mild hydronephrosis due to obstruction of distal right ureter   I also independently reviewed and agree with radiologist interpretations.   PROCEDURES:  Critical Care performed: No   MEDICATIONS ORDERED IN ED: Medications  fluconazole (DIFLUCAN) tablet 200 mg (has no administration in time range)  sodium chloride 0.9 % bolus 1,000 mL (has no administration in time range)  ceFEPIme (MAXIPIME) 2 g in sodium chloride 0.9 % 100 mL IVPB (has no administration in time range)  lidocaine (XYLOCAINE) 2 % jelly 1 Application (1 Application Urethral Given 05/10/22 1220)  iohexol (OMNIPAQUE) 300 MG/ML solution 80 mL (80 mLs Intravenous Contrast Given 05/10/22 1311)     IMPRESSION / MDM / ASSESSMENT AND PLAN / ED COURSE  I reviewed the triage vital signs and the nursing notes.                                The patient is on the cardiac monitor to evaluate for evidence of arrhythmia and/or significant heart rate changes.   Ddx:  Differential includes the following, with pertinent life- or limb-threatening emergencies considered:  Ongoing balanitis, penile cellulitis, UTI, dehydration, AKI, sepsis  Patient's presentation is most consistent with acute presentation with potential threat to life or bodily function.  MDM:  88 year old male with history of recently diagnosed metastatic prostate cancer here with penile rash, drainage around his Foley site, and generalized weakness.  Patient also with profuse diarrhea.  Regarding the diarrhea, I suspect this is antibiotic associated.  Fortunately, C. difficile and GI panel is negative.  Will treat with probiotics and antidiarrheals.  Regarding his penile pain and redness, patient has significant balanitis on exam as well as possible cellulitis.  Patient also has a concomitant UTI which I suspect is contributing to the skin breakdown and irritation.  Patient will be switched to cefepime as well as started on oral Diflucan.  We will plan to admit the patient.  He appears significantly dehydrated and generally weak.  He is orthostatic.  IV fluids given.  Patient is a patient at the [name], will call them to see if bed availability, if not will admit to medicine here.  Do not see anything that would need surgical intervention at this time based on his imaging.  His mild hydronephrosis is stable and likely due to his underlying prostate cancer, creatinine is at baseline.   MEDICATIONS GIVEN IN ED: Medications  fluconazole (DIFLUCAN) tablet 200 mg (has no administration in time range)  sodium chloride 0.9 % bolus 1,000 mL (has no administration in time range)  ceFEPIme (MAXIPIME) 2 g in sodium chloride 0.9 % 100 mL IVPB (has no administration in time range)  lidocaine (XYLOCAINE) 2 % jelly 1 Application (1 Application Urethral Given 05/10/22 1220)  iohexol  (OMNIPAQUE) 300 MG/ML solution 80 mL (80 mLs Intravenous Contrast Given 05/10/22 1311)     Consults:     EMR reviewed  Prior ED visits, recent ED visit and transfer to [place] IMPRESSION(S) / ED DIAGNOSES   Final diagnoses:  Antibiotic-associated diarrhea  Urinary tract infection associated with indwelling urethral catheter, initial encounter ([place])  Balanitis     Rx / DC Orders   ED Discharge Orders  None        Note:  This document was prepared using Dragon voice recognition software and may include unintentional dictation errors.   Duffy Bruce, MD 05/10/22 [phone]

## 2022-05-10 NOTE — Assessment & Plan Note (Addendum)
Balanitis and UTI: Treated during the hospital course.

## 2022-05-10 NOTE — Assessment & Plan Note (Addendum)
No behavior disturbance -Continue home Namenda, galantamine

## 2022-05-10 NOTE — ED Notes (Signed)
Patient placed on bedpan and had episode of diarrhea.

## 2022-05-10 NOTE — Assessment & Plan Note (Deleted)
Patient has metastasized prostate cancer. -Follow-up with urology/oncology in [place] home [facility]

## 2022-05-10 NOTE — Assessment & Plan Note (Deleted)
Hold Norvasc.  Blood pressure dropped with standing today with physical therapy.  We will give fluid bolus and recheck tomorrow.

## 2022-05-10 NOTE — Assessment & Plan Note (Addendum)
Treated previously.  Urine culture actually negative.

## 2022-05-10 NOTE — ED Provider Notes (Signed)
Patient received in signout from Dr. Ellender Hose pending [place] hospital callback.  They do not have any beds at their facility, we will therefore consult with hospitalist and admit to our facility.   Vladimir Crofts, MD 05/10/22 [phone]

## 2022-05-10 NOTE — H&P (Signed)
History and Physical    Steven Elliott FFM:[id number] DOB: [birth year] DOA: 05/10/2022  Referring MD/NP/PA:   PCP: [name]   Patient coming from:  The patient is coming from home.  At baseline, pt is independent for most of ADL.        Chief Complaint: Diarrhea  HPI: Steven Elliott is a 88 y.o. 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  Patient states that because of penile swelling, he was sent in ED on 8/14, and diagnosed with balanitis. He was started on Keflex on 8/14.  He developed diarrhea 3 days ago, which has been persistent.  He has more than 10 times of watery diarrhea each day.  No nausea, vomiting or abdominal pain.  No fever or chills.  Due to urinary retention, patient has Foley catheter placement, which is changed in ED today.  Patient does not have chest pain, cough, shortness of breath. Pt still has penile swelling, pain and erythema.  He has purulent discharge around the urethra.    Patient is Steven [facility] patient.  ED physician tried to transfer patient to [facility], but they do not have beds.  Data reviewed independently and ED Course: pt was found to have WBC 11.5, positive urinalysis (yellow appearance, large amount of leukocyte, rare bacteria, WBC> 50), negative C. difficile test and negative GI pathogen panel.  Renal function close to baseline.  Abnormal liver function (ALP 253, AST 152, ALT 81, total bilirubin 1.1), lactic acid 0.9, temperature normal, blood pressure 107/80, heart rate 87, 17, oxygen saturation 98% on room air.  Patient is admitted to [facility] bed as inpatient.  CT abdomen/pelvis: 1. Severe prostatomegaly with extensive, nodular tissue about the prostate, adjacent bladder base, and rectum. 2. Enlarged, necrotic appearing perirectal, right pelvic sidewall, and right iliac lymph  nodes. 3. Findings are most consistent with locally advanced prostate or rectal malignancy with direct involvement of adjacent organs and nodal metastatic disease. 4. Severely thickened urinary bladder wall with Foley catheter. 5. Mild right hydronephrosis and hydroureter, similar to prior examination, presumably secondary to obstruction of the distal right ureter by pelvic malignancy. 6. Interval resolution of previously seen pneumatosis of the proximal to mid small bowel. 7. Fluid-filled colon, in keeping with diarrheal illness. 8. Severe emphysema. 9. Coronary artery disease.   Aortic Atherosclerosis (ICD10-I70.0) and Emphysema (ICD10-J43.9).   EKG: Not done in ED, will get one.     Review of Systems:   General: no fevers, chills, no body weight gain, has poor appetite, has fatigue HEENT: no blurry vision, hearing changes or sore throat Respiratory: no dyspnea, coughing, wheezing CV: no chest pain, no palpitations GI: no nausea, vomiting, abdominal pain, has diarrhea, no constipation GU: Patient has penile swelling, erythema, pain, purulent discharge from the urethra Ext: no leg edema Neuro: no unilateral weakness, numbness, or tingling, no vision change or hearing loss Skin: no rash, no skin tear. MSK: No muscle spasm, no deformity, no limitation of range of movement in spin Heme: No easy bruising.  Travel history: No recent long distant travel.   Allergy:  Allergies  Allergen Reactions   Procaine Anaphylaxis   Ciprofloxacin Other (See Comments) and Nausea And Vomiting    Malaise, Rigor   Hydrocodone     Other reaction(s): Rash, Hives, Heart palpitations ()   Hydrocodone-Acetaminophen Itching    Watery eyes   Lisinopril Other (See Comments)   Omeprazole  Other (See Comments)    GI reaction   Penicillins Other (See Comments)   Rabeprazole Other (See Comments)    GI reaction    Past Medical History:  Diagnosis Date   Allergic rhinitis    Aneurysm of  infrarenal abdominal aorta ([place]) 02/2015   5.4 cm by CT 03/20/16.    Anosmia    BPH (benign prostatic hyperplasia)    COPD (chronic obstructive pulmonary disease) (HCC)    Diverticulosis    Emphysema (subcutaneous) (surgical) resulting from a procedure    severe by CT 03/20/16   GERD (gastroesophageal reflux disease)    History of bilateral inguinal hernias 02/2015   Hypertension    Peripheral vascular disease ([place]) 02/2015   heavily calcified but patent bil iliac arteries by CTA 03/20/16.    Past Surgical History:  Procedure Laterality Date   APPENDECTOMY     CHOLECYSTECTOMY     COLONOSCOPY  ~ 2007   at [facility] Left 2014    Social History:  reports that he has quit smoking. His smoking use included cigarettes and cigars. He has never used smokeless tobacco. He reports current alcohol use. He reports that he does not use drugs.  Family History:  Family History  Problem Relation Age of Onset   Breast cancer Mother    Pancreatitis Mother    Breast cancer Sister      Prior to Admission medications   Medication Sig Start Date End Date Taking? Authorizing Provider  acetaminophen (TYLENOL) 500 MG tablet Take 500 mg by mouth every 6 (six) hours as needed.    [provider]  albuterol (PROVENTIL HFA;VENTOLIN HFA) 108 (90 Base) MCG/ACT inhaler Inhale 2 puffs into the lungs every 6 (six) hours as needed for wheezing or shortness of breath.    [provider]  amLODipine (NORVASC) 5 MG tablet TAKE ONE TABLET BY MOUTH DAILY FOR BLOOD PRESSURE. 01/26/22   [provider]  Calcium Carbonate-Vitamin D (CALCIUM-D) 600-400 MG-UNIT TABS Take 1 tablet by mouth 2 (two) times daily.    [provider]  cephALEXin (KEFLEX) 500 MG capsule Take 1 capsule (500 mg total) by mouth 4 (four) times daily for 7 days. 05/04/22 05/11/22  [name], MD  cetirizine (ZYRTEC) 5 MG tablet Take 1 tablet (5 mg total) by mouth  daily. 09/13/20   Jaynee Eagles, PA-C  clotrimazole (LOTRIMIN) 1 % cream Apply 1 Application topically 2 (two) times daily. 05/04/22   [name], MD  cyclobenzaprine (FLEXERIL) 5 MG tablet Take 5 mg by mouth 3 (three) times daily as needed for muscle spasms.    [provider]  fluticasone (FLONASE) 50 MCG/ACT nasal spray Place 1 spray into both nostrils daily.    [provider]  gabapentin (NEURONTIN) 100 MG capsule Take 200 mg by mouth at bedtime.    [provider]  galantamine (RAZADYNE ER) 24 MG 24 hr capsule Take 24 mg by mouth daily with breakfast.    [provider]  hydrochlorothiazide (HYDRODIURIL) 25 MG tablet Take 12.5 mg by mouth daily.    [provider]  ipratropium (ATROVENT) 0.06 % nasal spray Place 2 sprays into both nostrils 2 (two) times daily.    [provider]  ketoconazole (NIZORAL) 2 % shampoo SHAMPOO AS DIRECTED AFFECTED AREA THREE TIMES A WEEK AS NEEDED (LEAVE ON AFFECTED AREA FOR 10 MINUTES, THEN RINSE OFF WITH WATER) MAY USE AS A  FACE [place] DAILY TO SCALY SPOTS 03/02/22   [provider]  memantine (NAMENDA) 10 MG tablet Take 10 mg by mouth 2 (two) times daily.    [provider]  Multiple Vitamin (MULTIVITAMIN WITH MINERALS) TABS tablet Take 1 tablet by mouth daily.    [provider]  tamsulosin (FLOMAX) 0.4 MG CAPS capsule Take 0.4 mg by mouth at bedtime.    [provider]  traZODone (DESYREL) 50 MG tablet TAKE ONE TABLET BY MOUTH AT BEDTIME --MAY START WITH QUARTER TABLETS, MAY PROGRESS TO WHOLE TABLETS FOR INSOMNIA 02/18/22   [provider]  trospium (SANCTURA) 20 MG tablet Take 1 tablet (20 mg total) by mouth 2 (two) times daily. 11/24/21   Zara Council A, PA-C  vitamin B-12 (CYANOCOBALAMIN) 500 MCG tablet Take 1 tablet by mouth daily. 11/06/21   [provider]    Physical Exam: Vitals:   05/10/22 1430 05/10/22 1530 05/10/22 1600 05/10/22 1829  BP:  107/80 125/77 (!) 134/58 133/64  Pulse: 80 81 79 76  Resp: '16  16 16  '$ Temp:   (!) 97.5 F (36.4 C) (!) 97.5 F (36.4 C)  TempSrc:   Oral   SpO2: 94% 94% 94% 95%   General: Not in acute distress HEENT:       Eyes: PERRL, EOMI, no scleral icterus.       ENT: No discharge from the ears and nose, no pharynx injection, no tonsillar enlargement.        Neck: No JVD, no bruit, no mass felt. Heme: No neck lymph node enlargement. Cardiac: S1/S2, RRR, No murmurs, No gallops or rubs. Respiratory: No rales, wheezing, rhonchi or rubs. GI: Soft, nondistended, nontender, no rebound pain, no organomegaly, BS present. GU: has penile swelling, erythema, tenderness, purulent discharge from the urethra Ext: No pitting leg edema bilaterally. 1+DP/PT pulse bilaterally. Musculoskeletal: No joint deformities, No joint redness or warmth, no limitation of ROM in spin. Skin: No rashes.  Neuro: Alert, oriented X3, cranial nerves II-XII grossly intact, moves all extremities normally.  Psych: Patient is not psychotic, no suicidal or hemocidal ideation.  Labs on Admission: I have personally reviewed following labs and imaging studies  CBC: Recent Labs  Lab 05/04/22 1926 05/10/22 1153  WBC 9.6 11.5*  NEUTROABS 6.8  --   HGB 13.7 13.1  HCT 42.4 40.4  MCV 97.2 97.6  PLT 324 034   Basic Metabolic Panel: Recent Labs  Lab 05/04/22 1926 05/10/22 1153  NA 138 135  K 4.1 4.2  CL 102 103  CO2 27 24  GLUCOSE 116* 93  BUN 15 18  CREATININE 1.11 1.17  CALCIUM 9.0 8.6*   GFR: Estimated Creatinine Clearance: 40.6 mL/min (by C-G formula based on SCr of 1.17 mg/dL). Liver Function Tests: Recent Labs  Lab 05/10/22 1153  AST 152*  ALT 81*  ALKPHOS 253*  BILITOT 1.1  PROT 7.7  ALBUMIN 3.7   Recent Labs  Lab 05/10/22 1153  LIPASE 26   No results for input(s): "AMMONIA" in the last 168 hours. Coagulation Profile: No results for input(s): "INR", "PROTIME" in the last 168 hours. Cardiac  Enzymes: No results for input(s): "CKTOTAL", "CKMB", "CKMBINDEX", "TROPONINI" in the last 168 hours. BNP (last 3 results) No results for input(s): "PROBNP" in the last 8760 hours. HbA1C: No results for input(s): "HGBA1C" in the last 72 hours. CBG: No results for input(s): "GLUCAP" in the last 168 hours. Lipid Profile: No results for input(s): "CHOL", "HDL", "LDLCALC", "TRIG", "CHOLHDL", "LDLDIRECT" in  the last 72 hours. Thyroid Function Tests: No results for input(s): "TSH", "T4TOTAL", "FREET4", "T3FREE", "THYROIDAB" in the last 72 hours. Anemia Panel: No results for input(s): "VITAMINB12", "FOLATE", "FERRITIN", "TIBC", "IRON", "RETICCTPCT" in the last 72 hours. Urine analysis:    Component Value Date/Time   COLORURINE YELLOW (A) 05/10/2022 1234   APPEARANCEUR HAZY (A) 05/10/2022 1234   APPEARANCEUR Clear 03/11/2022 1309   LABSPEC 1.015 05/10/2022 1234   LABSPEC 1.009 04/07/2012 1408   PHURINE 5.0 05/10/2022 1234   GLUCOSEU NEGATIVE 05/10/2022 1234   GLUCOSEU Negative 04/07/2012 1408   HGBUR LARGE (A) 05/10/2022 1234   BILIRUBINUR NEGATIVE 05/10/2022 1234   BILIRUBINUR Negative 03/11/2022 1309   BILIRUBINUR Negative 04/07/2012 1408   KETONESUR NEGATIVE 05/10/2022 1234   PROTEINUR 100 (A) 05/10/2022 1234   NITRITE NEGATIVE 05/10/2022 1234   LEUKOCYTESUR LARGE (A) 05/10/2022 1234   LEUKOCYTESUR Negative 04/07/2012 1408   Sepsis Labs: '@LABRCNTIP'$ (procalcitonin:4,lacticidven:4) ) Recent Results (from the past 240 hour(s))  Gastrointestinal Panel by PCR , Stool     Status: None   Collection Time: 05/10/22 12:34 PM   Specimen: Stool  Result Value Ref Range Status   Campylobacter species NOT DETECTED NOT DETECTED Final   Plesimonas shigelloides NOT DETECTED NOT DETECTED Final   Salmonella species NOT DETECTED NOT DETECTED Final   Yersinia enterocolitica NOT DETECTED NOT DETECTED Final   Vibrio species NOT DETECTED NOT DETECTED Final   Vibrio cholerae NOT DETECTED NOT DETECTED  Final   Enteroaggregative E coli (EAEC) NOT DETECTED NOT DETECTED Final   Enteropathogenic E coli (EPEC) NOT DETECTED NOT DETECTED Final   Enterotoxigenic E coli (ETEC) NOT DETECTED NOT DETECTED Final   Shiga like toxin producing E coli (STEC) NOT DETECTED NOT DETECTED Final   Shigella/Enteroinvasive E coli (EIEC) NOT DETECTED NOT DETECTED Final   Cryptosporidium NOT DETECTED NOT DETECTED Final   Cyclospora cayetanensis NOT DETECTED NOT DETECTED Final   Entamoeba histolytica NOT DETECTED NOT DETECTED Final   Giardia lamblia NOT DETECTED NOT DETECTED Final   Adenovirus F40/41 NOT DETECTED NOT DETECTED Final   Astrovirus NOT DETECTED NOT DETECTED Final   Norovirus GI/GII NOT DETECTED NOT DETECTED Final   Rotavirus A NOT DETECTED NOT DETECTED Final   Sapovirus (I, II, IV, and V) NOT DETECTED NOT DETECTED Final    Comment: Performed at [facility] [address] [place], [place] [address]  C Difficile Quick Screen w PCR reflex     Status: None   Collection Time: 05/10/22 12:34 PM   Specimen: STOOL  Result Value Ref Range Status   C Diff antigen NEGATIVE NEGATIVE Final   C Diff toxin NEGATIVE NEGATIVE Final   C Diff interpretation No C. difficile detected.  Final    Comment: Performed at [facility], [address] [place], [place] [address]     Radiological Exams on Admission: CT ABDOMEN PELVIS W CONTRAST  Result Date: 05/10/2022 CLINICAL DATA:  Abdominal pain, diarrhea for 3 days, antibiotic treatment for penile infection around urinary catheter * Tracking Code: BO * EXAM: CT ABDOMEN AND PELVIS WITH CONTRAST TECHNIQUE: Multidetector CT imaging of the abdomen and pelvis was performed using the standard protocol following bolus administration of intravenous contrast. RADIATION DOSE REDUCTION: This exam was performed according to the departmental dose-optimization program which includes automated exposure control, adjustment of the mA and/or kV according to patient  size and/or use of iterative reconstruction technique. CONTRAST:  14m OMNIPAQUE IOHEXOL 300 MG/ML  SOLN COMPARISON:  04/23/2022 FINDINGS: Lower chest: No  acute abnormality. Severe emphysema. Three-vessel coronary artery calcifications. Small hiatal hernia. Hepatobiliary: No focal liver abnormality is seen. Status post cholecystectomy. Postop biliary dilatation. Pancreas: Unremarkable. No pancreatic ductal dilatation or surrounding inflammatory changes. Spleen: Normal in size without significant abnormality. Adrenals/Urinary Tract: Adrenal glands are unremarkable. Mild right hydronephrosis and hydroureter, similar to prior examination. No left-sided hydronephrosis. Severely thickened urinary bladder wall. Foley catheter in the bladder. Stomach/Bowel: Stomach is within normal limits. Diverticulum of the descending duodenum. Appendix is not clearly visualized. No evidence of bowel wall thickening, distention, or inflammatory changes. Interval resolution of previously seen pneumatosis of the proximal to mid small bowel. Descending and sigmoid diverticulosis. Colon is fluid-filled to the rectum. Vascular/Lymphatic: Aortic atherosclerosis. Infrarenal abdominal aortic aneurysm status post aortobiiliac stent endograft repair, unchanged. Enlarged, necrotic appearing perirectal right pelvic sidewall, and right iliac lymph nodes, largest right iliac node measuring 2.8 x 2.6 cm (series 3, image 64). Reproductive: Severe prostatomegaly. Extensive, nodular tissue about the prostate, adjacent bladder base, and rectum (series 3, image 69). Other: Large, fat containing bilateral inguinal hernias, partially imaged. No ascites. Musculoskeletal: No acute or significant osseous findings. IMPRESSION: 1. Severe prostatomegaly with extensive, nodular tissue about the prostate, adjacent bladder base, and rectum. 2. Enlarged, necrotic appearing perirectal, right pelvic sidewall, and right iliac lymph nodes. 3. Findings are most consistent  with locally advanced prostate or rectal malignancy with direct involvement of adjacent organs and nodal metastatic disease. 4. Severely thickened urinary bladder wall with Foley catheter. 5. Mild right hydronephrosis and hydroureter, similar to prior examination, presumably secondary to obstruction of the distal right ureter by pelvic malignancy. 6. Interval resolution of previously seen pneumatosis of the proximal to mid small bowel. 7. Fluid-filled colon, in keeping with diarrheal illness. 8. Severe emphysema. 9. Coronary artery disease. Aortic Atherosclerosis (ICD10-I70.0) and Emphysema (ICD10-J43.9). Electronically Signed   By: Delanna Ahmadi M.D.   On: 05/10/2022 13:42      Assessment/Plan Principal Problem:   Balanitis Active Problems:   UTI (urinary tract infection)   Hypertension   BPH (benign prostatic hyperplasia)   Diarrhea   Abnormal LFTs   COPD (chronic obstructive pulmonary disease) (HCC)   Chronic kidney disease, stage 3a (HCC)   Prostate cancer (HCC)   Dementia (HCC)   Assessment and Plan: * Balanitis Balanitis and UTI: Patient does not meet criteria for sepsis.  -Admitted to MedSurg bed as inpatient -IV aztreonam -Diflucan -Follow-up urine culture  UTI (urinary tract infection) - See above  Hypertension Blood pressure 107/80 - IV hydralazine as needed -Hold HCTZ due to normal blood pressure -Continue home amlodipine  BPH (benign prostatic hyperplasia) Patient has urinary retention -S/p of Foley catheter -Flomax  Diarrhea Has negative C. difficile and GI pathogen panel -Imodium as needed -IV fluid: 1 L normal saline  Abnormal LFTs Etiology is not clear, may be due to ongoing infection.  side effects of Casodex is also possible, but given abnormal liver function is mild and patient has metastasized prostate cancer, will not hold off Casodex. -Avoid using Tylenol -Check hepatitis panel  COPD (chronic obstructive pulmonary disease)  (HCC) Stable -Bronchodilators  Chronic kidney disease, stage 3a (HCC) Renal function close to baseline -Follow-up with BMP  Prostate cancer [facility]) Patient has metastasized prostate cancer. -Follow-up with urology/oncology in [place] Casodex  Dementia [facility]) Note behavior disturbance -Continue home Namenda, galantamine          DVT ppx: SQ Lovenox  Code Status: Full code  Family Communication:  Yes, patient's daughter  at bed side.     Disposition Plan:  Anticipate discharge back to previous environment  Consults called:  none  Admission status and Level of care: Med-Surg:    as inpt        Dispo: The patient is from: Home              Anticipated d/c is to: Home              Anticipated d/c date is: 2 days              Patient currently is not medically stable to d/c.    Severity of Illness:  The appropriate patient status for this patient is INPATIENT. Inpatient status is judged to be reasonable and necessary in order to provide the required intensity of service to ensure the patient's safety. The patient's presenting symptoms, physical exam findings, and initial radiographic and laboratory data in the context of their chronic comorbidities is felt to place them at high risk for further clinical deterioration. Furthermore, it is not anticipated that the patient will be medically stable for discharge from the [facility] within 2 midnights of admission.   * I certify that at the point of admission it is my clinical judgment that the patient will require inpatient [facility] care spanning beyond 2 midnights from the point of admission due to high intensity of service, high risk for further deterioration and high frequency of surveillance required.*       Date of Service 05/10/2022    Ivor Costa [facility]   If 7PM-7AM, please contact night-coverage [email or web address] 05/10/2022, 7:39 PM

## 2022-05-10 NOTE — Assessment & Plan Note (Deleted)
Creatinine 0.85 with a GFR greater than 60

## 2022-05-10 NOTE — Assessment & Plan Note (Signed)
Stable -Bronchodilators 

## 2022-05-11 ENCOUNTER — Encounter: Payer: Self-pay | Admitting: Internal Medicine

## 2022-05-11 DIAGNOSIS — N342 Other urethritis: Secondary | ICD-10-CM | POA: Diagnosis present

## 2022-05-11 LAB — COMPREHENSIVE METABOLIC PANEL
ALT: 78 U/L — ABNORMAL HIGH (ref 0–44)
AST: 92 U/L — ABNORMAL HIGH (ref 15–41)
Albumin: 3.5 g/dL (ref 3.5–5.0)
Alkaline Phosphatase: 232 U/L — ABNORMAL HIGH (ref 38–126)
Anion gap: 8 (ref 5–15)
BUN: 13 mg/dL (ref 8–23)
CO2: 25 mmol/L (ref 22–32)
Calcium: 8.5 mg/dL — ABNORMAL LOW (ref 8.9–10.3)
Chloride: 104 mmol/L (ref 98–111)
Creatinine, Ser: 0.87 mg/dL (ref 0.61–1.24)
GFR, Estimated: >60 mL/min (ref >60)
Glucose, Bld: 105 mg/dL — ABNORMAL HIGH (ref 70–99)
Potassium: 4 mmol/L (ref 3.5–5.1)
Sodium: 137 mmol/L (ref 135–145)
Total Bilirubin: 0.9 mg/dL (ref 0.3–1.2)
Total Protein: 7.6 g/dL (ref 6.5–8.1)

## 2022-05-11 LAB — URINE CULTURE: Culture: NO GROWTH

## 2022-05-11 LAB — CBC
HCT: 39.1 % (ref 39.0–52.0)
Hemoglobin: 12.9 g/dL — ABNORMAL LOW (ref 13.0–17.0)
MCH: 31.3 pg (ref 26.0–34.0)
MCHC: 33 g/dL (ref 30.0–36.0)
MCV: 94.9 fL (ref 80.0–100.0)
Platelets: 317 10*3/uL (ref 150–400)
RBC: 4.12 MIL/uL — ABNORMAL LOW (ref 4.22–5.81)
RDW: 12.8 % (ref 11.5–15.5)
WBC: 11.8 10*3/uL — ABNORMAL HIGH (ref 4.0–10.5)
nRBC: 0 % (ref 0.0–0.2)

## 2022-05-11 LAB — HEPATITIS PANEL, ACUTE
HCV Ab: NONREACTIVE
Hep A IgM: NONREACTIVE
Hep B C IgM: NONREACTIVE
Hepatitis B Surface Ag: NONREACTIVE

## 2022-05-11 MED ORDER — LIDOCAINE HCL URETHRAL/MUCOSAL 2 % EX GEL
1.0000 | Freq: Once | CUTANEOUS | Status: AC
Start: 2022-05-11 — End: 2022-05-11
  Administered 2022-05-11: 1 via URETHRAL
  Filled 2022-05-11: qty 5

## 2022-05-11 NOTE — Consult Note (Signed)
Pharmacy Antibiotic Note  Steven Elliott is a 88 y.o. male admitted on 05/10/2022 with  ongoing diarrhea and penile pain .  Patient was diagnosed with metastatic prostate cancer and has indwelling foley catheter. He was recently evaluated for balanitis and placed on cephalexin for treatment. Pharmacy has been consulted for Aztreonam dosing.   Of note: patient has stated to physician that he has a severe allergy and refused to be treated with Cefepime (although it looks like he received a dose of Zosyn on 8/3). Therefore, will continue with Aztreonam currently.  Plan: Continue Aztreonam 2g IV Q8 hours.  Height: '5\' 8"'$  (172.7 cm) IBW/kg (Calculated) : 68.4  Temp (24hrs), Avg:97.7 F (36.5 C), Min:97.5 F (36.4 C), Max:98.2 F (36.8 C)  Recent Labs  Lab 05/04/22 1926 05/10/22 1153 05/10/22 1234 05/10/22 1851 05/11/22 0232  WBC 9.6 11.5*  --   --  11.8*  CREATININE 1.11 1.17  --   --  0.87  LATICACIDVEN  --   --  0.9 1.5  --      Estimated Creatinine Clearance: 54.6 mL/min (by C-G formula based on SCr of 0.87 mg/dL).    Allergies  Allergen Reactions   Procaine Anaphylaxis   Ciprofloxacin Other (See Comments) and Nausea And Vomiting    Malaise, Rigor   Hydrocodone     Other reaction(s): Rash, Hives, Heart palpitations ()   Hydrocodone-Acetaminophen Itching    Watery eyes   Lisinopril Other (See Comments)   Omeprazole Other (See Comments)    GI reaction   Penicillins Other (See Comments)   Rabeprazole Other (See Comments)    GI reaction    Antimicrobials this admission: Aztreonam 8/20 >>    Dose adjustments this admission: N/A  Microbiology results: 8/20 UCx: in process 8/20 GI panel: negative  8/20 Cdiff quick scan: negative  Thank you for allowing pharmacy to be a part of this patient's care.  Steven Elliott 05/11/2022 10:28 AM

## 2022-05-11 NOTE — Progress Notes (Signed)
Mobility Specialist - Progress Note    05/11/22 0935  Mobility  Activity Ambulated with assistance in hallway;Stood at bedside;Dangled on edge of bed  Level of Assistance Minimal assist, patient does 75% or more  Assistive Device Front wheel walker  Distance Ambulated (ft) 120 ft  Activity Response Tolerated well  $Mobility charge 1 Mobility   Pt semi-supine in bed on RA upon arrival. Pt scoots to EOB and STS MinA. Pt ambulates in hallway indep with slow gait. Pt returns to bed with needs in reach, daughter in room, and bed alarm set.   Gretchen Short  Mobility Specialist  05/11/22 9:37 AM

## 2022-05-11 NOTE — Progress Notes (Signed)
PROGRESS NOTE  KWAMANE WHACK UKG:[id number] DOB: 09/19/1930 DOA: 05/10/2022 PCP: [name]  Brief History    OLIN GURSKI is a 88 y.o. 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   Patient states that because of penile swelling, he was sent in ED on 8/14, and diagnosed with balanitis. He was started on Keflex on 8/14.  He developed diarrhea 3 days ago, which has been persistent.  He has more than 10 times of watery diarrhea each day.  No nausea, vomiting or abdominal pain.  No fever or chills.  Due to urinary retention, patient has Foley catheter placement, which is changed in ED today.  Patient does not have chest pain, cough, shortness of breath. Pt still has penile swelling, pain and erythema.  He has purulent discharge around the urethra.     Patient is [facility] patient.  ED physician tried to transfer patient to [facility], but they do not have beds. If we are notified that the patient has a bed, he will be transferred.   Data reviewed independently and ED Course: pt was found to have WBC 11.5, positive urinalysis (yellow appearance, large amount of leukocyte, rare bacteria, WBC> 50), negative C. difficile test and negative GI pathogen panel.  Renal function close to baseline.  Abnormal liver function (ALP 253, AST 152, ALT 81, total bilirubin 1.1), lactic acid 0.9, temperature normal, blood pressure 107/80, heart rate 87, 17, oxygen saturation 98% on room air.    CT abdomen and pelvis has demonstrated severe prostatomegaly with extensive nodular tissue about the prostate and bladder base and rectum. There is an enlarged necrotic appearing perirectal right pelvic sidewall and right iliac lymph nodes. Consistent with locally advanced prostate or rectal malignancy with direct involvement of adjacent organs and nodal metastatic  disease. There is a severely thickened urinary bladder wall with a foley catheter. Mild right hydronephrosis and hydroureter similar to previous examination. Due to obstruction of the distal right ureter by pelvic malignancy. The colon is fluid filled consistent with diarrheal illness. There is also severe emphysema and CAD.  Patient is admitted to [facility] bed as inpatient. He will receive azactam and diflucan. He is being continued on flomax and casodex.  Consultants  None  Procedures  None  Antibiotics   Anti-infectives (From admission, onward)    Start     Dose/Rate Route Frequency Ordered Stop   05/11/22 1500  fluconazole (DIFLUCAN) tablet 200 mg        200 mg Oral Daily 05/10/22 1734     05/10/22 1800  aztreonam (AZACTAM) 2 g in sodium chloride 0.9 % 100 mL IVPB        2 g 200 mL/hr over 30 Minutes Intravenous Every 8 hours 05/10/22 1752 02/03/24 0559   05/10/22 1515  fluconazole (DIFLUCAN) tablet 200 mg        200 mg Oral  Once 05/10/22 1508 05/10/22 1558   05/10/22 1515  ceFEPIme (MAXIPIME) 2 g in sodium chloride 0.9 % 100 mL IVPB  Status:  Discontinued        2 g 200 mL/hr over 30 Minutes Intravenous  Once 05/10/22 1514 05/10/22 1606      Subjective  The patient is resting quietly. No new complaints.   Objective   Vitals:  Vitals:   05/11/22 0856 05/11/22 1240  BP: 129/65 (!) 115/53  Pulse: 82 85  Resp: 16 16  Temp: 98.2 F (36.8 C) 97.8 F (36.6 C)  SpO2: 95% 94%    Exam:  Constitutional:  The patient is awake, alert, and oriented x 3. No acute distress. Respiratory:  No increased work of breathing. No wheezes, rales, or rhonchi No tactile fremitus Cardiovascular:  Regular rate and rhythm No murmurs, ectopy, or gallups. No lateral PMI. No thrills. Abdomen:  Abdomen is soft, non-tender, non-distended No hernias, masses, or organomegaly Normoactive bowel sounds.  Musculoskeletal:  No cyanosis, clubbing, or edema Skin:  No rashes, lesions,  ulcers palpation of skin: no induration or nodules Neurologic:  CN 2-12 intact Sensation all 4 extremities intact Psychiatric:  Mental status Mood, affect appropriate Orientation to person, place, time  judgment and insight appear intact   I have personally reviewed the following:   Today's Data  Vitals  Lab Data  CMP CBC  Micro Data  Urine culture: No growth Blood culture: Pending.  Imaging  CT: abdomen and pelvis  Cardiology Data  EKG  Other Data    Scheduled Meds:  amLODipine  5 mg Oral Daily   bicalutamide  50 mg Oral Daily   calcium-vitamin D  1 tablet Oral BID   cyanocobalamin  500 mcg Oral Daily   DULoxetine  20 mg Oral BID   enoxaparin (LOVENOX) injection  40 mg Subcutaneous Q24H   fluconazole  200 mg Oral Daily   gabapentin  100 mg Oral QHS   galantamine  16 mg Oral Q breakfast   loratadine  10 mg Oral Daily   magnesium oxide  400 mg Oral Daily   memantine  10 mg Oral BID   multivitamin with minerals  1 tablet Oral Daily   tamsulosin  0.4 mg Oral QHS   traZODone  50 mg Oral QHS   Continuous Infusions:  aztreonam 2 g (05/11/22 2836)    Principal Problem:   Balanitis Active Problems:   UTI (urinary tract infection)   Hypertension   BPH (benign prostatic hyperplasia)   Diarrhea   Abnormal LFTs   COPD (chronic obstructive pulmonary disease) (HCC)   Chronic kidney disease, stage 3a ([place])   Prostate cancer (HCC)   Dementia (HCC)   LOS: 1 day   A & P  Assessment and Plan: * Balanitis Balanitis and UTI: Patient does not meet criteria for sepsis.  -Admitted to MedSurg bed as inpatient -IV aztreonam -Diflucan -Follow-up urine culture -Consult urology if the patient does not improve.  UTI (urinary tract infection) - See above  Hypertension Blood pressure is normotensive on amlodipine and prn hydralazine. HCTZ has been held.    BPH (benign prostatic hyperplasia) Patient has urinary retention -S/p of Foley  catheter -Flomax  Diarrhea Has negative C. difficile and GI pathogen panel -Imodium as needed -IV fluid: 1 L normal saline  Abnormal LFTs Etiology is not clear, may be due to ongoing infection.  side effects of Casodex is also possible, but given abnormal liver function is mild and patient has metastasized prostate cancer, will not hold off Casodex. -Avoid using Tylenol LFT's are down from admission. Continue to monitor.  COPD (chronic obstructive pulmonary disease) (HCC) Stable -Bronchodilators  Chronic kidney disease, stage 3a ([place]) Renal function close to baseline -Follow-up with BMP  Prostate cancer [facility]) Patient has metastasized prostate cancer. -Follow-up with urology/oncology in [place] home Casodex  Dementia [facility]) Note behavior disturbance -Continue home Namenda, galantamine  Urethritis With purulent drainage. Will culture.   The patient is resting comfortably. No new complaints.   DVT  prophylaxis: Lovenox Code Status: Full Code Family Communication: None available Disposition Plan: tbd    Ashlin Kreps, DO [facility] Direct contact: see [email or web address]  7PM-7AM contact night coverage as above 05/11/2022, 2:17 PM  LOS: 1 day

## 2022-05-11 NOTE — Assessment & Plan Note (Deleted)
With purulent drainage. Will culture.

## 2022-05-11 NOTE — Plan of Care (Signed)

## 2022-05-12 LAB — CBC WITH DIFFERENTIAL/PLATELET
Abs Immature Granulocytes: 0.05 10*3/uL (ref 0.00–0.07)
Basophils Absolute: 0 10*3/uL (ref 0.0–0.1)
Basophils Relative: 0 %
Eosinophils Absolute: 0.3 10*3/uL (ref 0.0–0.5)
Eosinophils Relative: 3 %
HCT: 38.6 % — ABNORMAL LOW (ref 39.0–52.0)
Hemoglobin: 12.6 g/dL — ABNORMAL LOW (ref 13.0–17.0)
Immature Granulocytes: 1 %
Lymphocytes Relative: 14 %
Lymphs Abs: 1.3 10*3/uL (ref 0.7–4.0)
MCH: 31.2 pg (ref 26.0–34.0)
MCHC: 32.6 g/dL (ref 30.0–36.0)
MCV: 95.5 fL (ref 80.0–100.0)
Monocytes Absolute: 0.7 10*3/uL (ref 0.1–1.0)
Monocytes Relative: 7 %
Neutro Abs: 6.8 10*3/uL (ref 1.7–7.7)
Neutrophils Relative %: 75 %
Platelets: 303 10*3/uL (ref 150–400)
RBC: 4.04 MIL/uL — ABNORMAL LOW (ref 4.22–5.81)
RDW: 13 % (ref 11.5–15.5)
WBC: 9.1 10*3/uL (ref 4.0–10.5)
nRBC: 0 % (ref 0.0–0.2)

## 2022-05-12 LAB — BASIC METABOLIC PANEL
Anion gap: 6 (ref 5–15)
BUN: 15 mg/dL (ref 8–23)
CO2: 27 mmol/L (ref 22–32)
Calcium: 8.8 mg/dL — ABNORMAL LOW (ref 8.9–10.3)
Chloride: 104 mmol/L (ref 98–111)
Creatinine, Ser: 0.9 mg/dL (ref 0.61–1.24)
GFR, Estimated: >60 mL/min (ref >60)
Glucose, Bld: 93 mg/dL (ref 70–99)
Potassium: 4.3 mmol/L (ref 3.5–5.1)
Sodium: 137 mmol/L (ref 135–145)

## 2022-05-12 LAB — HEPATIC FUNCTION PANEL
ALT: 54 U/L — ABNORMAL HIGH (ref 0–44)
AST: 47 U/L — ABNORMAL HIGH (ref 15–41)
Albumin: 3.3 g/dL — ABNORMAL LOW (ref 3.5–5.0)
Alkaline Phosphatase: 195 U/L — ABNORMAL HIGH (ref 38–126)
Bilirubin, Direct: 0.1 mg/dL (ref 0.0–0.2)
Indirect Bilirubin: 0.6 mg/dL (ref 0.3–0.9)
Total Bilirubin: 0.7 mg/dL (ref 0.3–1.2)
Total Protein: 7.2 g/dL (ref 6.5–8.1)

## 2022-05-12 MED ORDER — KETOROLAC TROMETHAMINE 15 MG/ML IJ SOLN
15.0000 mg | Freq: Four times a day (QID) | INTRAMUSCULAR | Status: DC
  Administered 2022-05-12 (×3): 15 mg via INTRAVENOUS
  Filled 2022-05-12 (×3): qty 1

## 2022-05-12 MED ORDER — SODIUM CHLORIDE 0.9 % IV SOLN: INTRAVENOUS | Status: DC

## 2022-05-12 MED ORDER — ACETAMINOPHEN 325 MG PO TABS
650.0000 mg | ORAL_TABLET | Freq: Four times a day (QID) | ORAL | Status: DC | PRN
  Administered 2022-05-12 – 2022-05-13 (×2): 650 mg via ORAL
  Filled 2022-05-12 (×2): qty 2

## 2022-05-12 NOTE — Plan of Care (Signed)
  Problem: Activity: Goal: Risk for activity intolerance will decrease Outcome: Progressing   Problem: Nutrition: Goal: Adequate nutrition will be maintained Outcome: Progressing   Problem: Elimination: Goal: Will not experience complications related to urinary retention Outcome: Not Met (add Reason) Note: Chronic foley   Problem: Pain Managment: Goal: General experience of comfort will improve Outcome: Progressing   Problem: Safety: Goal: Ability to remain free from injury will improve Outcome: Progressing   Problem: Skin Integrity: Goal: Risk for impaired skin integrity will decrease Outcome: Progressing

## 2022-05-12 NOTE — Consult Note (Signed)
Pharmacy Antibiotic Note  Steven Elliott is a 88 y.o. male admitted on 05/10/2022 with  ongoing diarrhea and penile pain .  Patient was diagnosed with metastatic prostate cancer and has indwelling foley catheter (placed 05/10/2022). He was recently evaluated for balanitis and placed on cephalexin for treatment. Patient reported severe allergy and refused to be treated with Cefepime (tolerated Zosyn on 8/3). Aztreonam will be continued for this reason. Renal function is stable and at baseline (WNL). Day 3 of antibiotics. Pharmacy has been consulted for Aztreonam dosing.   Plan: Continue Aztreonam 2g IV Q8H. Follow up Urethral cultures. Monitor renal function while on aztreonam.  Height: '5\' 8"'$  (172.7 cm) IBW/kg (Calculated) : 68.4  Temp (24hrs), Avg:98 F (36.7 C), Min:97.7 F (36.5 C), Max:98.4 F (36.9 C)  Recent Labs  Lab 05/10/22 1153 05/10/22 1234 05/10/22 1851 05/11/22 0232 05/12/22 0409  WBC 11.5*  --   --  11.8* 9.1  CREATININE 1.17  --   --  0.87 0.90  LATICACIDVEN  --  0.9 1.5  --   --      Estimated Creatinine Clearance: 52.8 mL/min (by C-G formula based on SCr of 0.9 mg/dL).    Allergies  Allergen Reactions   Procaine Anaphylaxis   Ciprofloxacin Other (See Comments) and Nausea And Vomiting    Malaise, Rigor   Hydrocodone     Other reaction(s): Rash, Hives, Heart palpitations ()   Hydrocodone-Acetaminophen Itching    Watery eyes   Lisinopril Other (See Comments)   Omeprazole Other (See Comments)    GI reaction   Penicillins Other (See Comments)   Rabeprazole Other (See Comments)    GI reaction    Antimicrobials this admission: Aztreonam 8/20 >>    Dose adjustments this admission: N/A  Microbiology results: 8/20 UCx: NG final 8/20 GIP: negative  8/20 Cdiff quick scan: negative 8/21 Urethral cx: in process  Thank you for allowing pharmacy to be a part of this patient's care.  Gretel Acre, PharmD PGY1 Pharmacy Resident 05/12/2022 9:21  AM

## 2022-05-12 NOTE — Progress Notes (Signed)
PROGRESS NOTE    TANIA PERROTT  KDT:[id number] DOB: Jul 02, 1930 DOA: 05/10/2022 PCP: [name]    Brief Narrative:  88 y.o. 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   Patient states that because of penile swelling, he was sent in ED on 8/14, and diagnosed with balanitis. He was started on Keflex on 8/14.  He developed diarrhea 3 days ago, which has been persistent.  He has more than 10 times of watery diarrhea each day.  No nausea, vomiting or abdominal pain.  No fever or chills.  Due to urinary retention, patient has Foley catheter placement, which is changed in ED today.  Patient does not have chest pain, cough, shortness of breath. Pt still has penile swelling, pain and erythema.  He has purulent discharge around the urethra.     Patient is [facility] patient.  ED physician tried to transfer patient to [facility], but they do not have beds. If we are notified that the patient has a bed, he will be transferred.  Started on Azactam and intravenous fluids   Assessment & Plan:   Principal Problem:   Balanitis Active Problems:   UTI (urinary tract infection)   Hypertension   BPH (benign prostatic hyperplasia)   Diarrhea   Abnormal LFTs   COPD (chronic obstructive pulmonary disease) (HCC)   Chronic kidney disease, stage 3a (HCC)   Prostate cancer (HCC)   Dementia (HCC)   Urethritis  Acute balanitis Urinary tract infection 1 to July due to the exposure to antibiotics previously urine culture shows no growth Patient endorses severe allergy to penicillins and refused cefepime Plan: Continue Azactam Intravenous fluids Continue Foley catheter Monitor vitals and fever curve  Acute hematuria Noted on Foley bag and on 8/22 Suspect secondary to traumatic Foley insertion Hemoglobin stable Monitor for now, consider  urology consult and CBI if hematuria worsens  Essential hypertension PTA amlodipine Hold hydrochlorothiazide As needed IV hydralazine  BPH Significant urinary retention, worsened by underlying balanitis PTA Flomax Continue Foley catheter Anticipate discharge with Foley catheter in place and outpatient follow-up with urology for voiding trial  Abnormal LFTs Unclear etiology Downtrending Possible mild ischemic hepatopathy versus medication effect  COPD (chronic obstructive pulmonary disease) (HCC) Stable PTA bronchodilators   Chronic kidney disease, stage 3a ([place]) Renal function close to baseline  Prostate cancer [facility]) Patient has metastasized prostate cancer. -Follow-up with urology/oncology in [place] home Casodex   Dementia ([place]) No behavior disturbance -Continue home Namenda, galantamine     DVT prophylaxis: SCDs, chemoprophylaxis on hold until hematuria resolves Code Status: Full Family Communication: Daughter at bedside 8/22 Disposition Plan: Status is: Inpatient Remains inpatient appropriate because: Acute balanitis on IV antibiotics.  Acute hematuria.  BPH with Foley catheter in place.   Level of care: Med-Surg  Consultants:  None  Procedures:  Foley catheter insertion  Antimicrobials: Aztreonam   Subjective: Seen and examined peer resting comfortably in bed.  No visible distress.  No complaints of pain.  Objective: Vitals:   05/11/22 1826 05/11/22 1954 05/11/22 2336 05/12/22 0847  BP: 116/63 131/63 136/65 133/62  Pulse: 84 85 82 87  Resp: '16 14 16 17  '$ Temp: 98.1 F (36.7 C) 97.7 F (36.5 C) 97.8 F (36.6 C) 98.4 F (36.9 C)  TempSrc:      SpO2: 96% 94% 95% 95%  Height:        Intake/Output Summary (Last  24 hours) at 05/12/2022 1223 Last data filed at 05/12/2022 0416 Gross per 24 hour  Intake 640 ml  Output 1700 ml  Net -1060 ml   There were no vitals filed for this visit.  Examination:  General exam: No acute  distress Respiratory system: Lungs clear.  Normal work of breathing.  Room air Cardiovascular system: S1-S2, RRR, no murmurs, no pedal edema Gastrointestinal system: Soft, NT/ND, normal bowel sounds GU: Foley catheter in place, draining blood-tinged urine Central nervous system: Alert, oriented x2, no focal deficits Extremities: Symmetric 5 x 5 power. Skin: No rashes, lesions or ulcers Psychiatry: Judgement and insight appear normal. Mood & affect appropriate.     Data Reviewed: I have personally reviewed following labs and imaging studies  CBC: Recent Labs  Lab 05/10/22 1153 05/11/22 0232 05/12/22 0409  WBC 11.5* 11.8* 9.1  NEUTROABS  --   --  6.8  HGB 13.1 12.9* 12.6*  HCT 40.4 39.1 38.6*  MCV 97.6 94.9 95.5  PLT 299 317 740   Basic Metabolic Panel: Recent Labs  Lab 05/10/22 1153 05/11/22 0232 05/12/22 0409  NA 135 137 137  K 4.2 4.0 4.3  CL 103 104 104  CO2 '24 25 27  '$ GLUCOSE 93 105* 93  BUN '18 13 15  '$ CREATININE 1.17 0.87 0.90  CALCIUM 8.6* 8.5* 8.8*   GFR: Estimated Creatinine Clearance: 52.8 mL/min (by C-G formula based on SCr of 0.9 mg/dL). Liver Function Tests: Recent Labs  Lab 05/10/22 1153 05/11/22 0232 05/12/22 0409  AST 152* 92* 47*  ALT 81* 78* 54*  ALKPHOS 253* 232* 195*  BILITOT 1.1 0.9 0.7  PROT 7.7 7.6 7.2  ALBUMIN 3.7 3.5 3.3*   Recent Labs  Lab 05/10/22 1153  LIPASE 26   No results for input(s): "AMMONIA" in the last 168 hours. Coagulation Profile: No results for input(s): "INR", "PROTIME" in the last 168 hours. Cardiac Enzymes: No results for input(s): "CKTOTAL", "CKMB", "CKMBINDEX", "TROPONINI" in the last 168 hours. BNP (last 3 results) No results for input(s): "PROBNP" in the last 8760 hours. HbA1C: No results for input(s): "HGBA1C" in the last 72 hours. CBG: No results for input(s): "GLUCAP" in the last 168 hours. Lipid Profile: No results for input(s): "CHOL", "HDL", "LDLCALC", "TRIG", "CHOLHDL", "LDLDIRECT" in the last  72 hours. Thyroid Function Tests: No results for input(s): "TSH", "T4TOTAL", "FREET4", "T3FREE", "THYROIDAB" in the last 72 hours. Anemia Panel: No results for input(s): "VITAMINB12", "FOLATE", "FERRITIN", "TIBC", "IRON", "RETICCTPCT" in the last 72 hours. Sepsis Labs: Recent Labs  Lab 05/10/22 1234 05/10/22 1851  LATICACIDVEN 0.9 1.5    Recent Results (from the past 240 hour(s))  Urine Culture     Status: None   Collection Time: 05/10/22 12:34 PM   Specimen: Urine, Clean Catch  Result Value Ref Range Status   Specimen Description   Final    URINE, CLEAN CATCH Performed at [facility], [address] [place], [place] [address]    Special Requests   Final    NONE Performed at [facility], [address] [place], [place] [address]    Culture   Final    NO GROWTH Performed at [facility], [place] [address], [place],  [address]    Report Status 05/11/2022 FINAL  Final  Gastrointestinal Panel by PCR , Stool     Status: None   Collection Time: 05/10/22 12:34 PM   Specimen: Stool  Result Value Ref Range Status   Campylobacter species NOT DETECTED NOT  DETECTED Final   Plesimonas shigelloides NOT DETECTED NOT DETECTED Final   Salmonella species NOT DETECTED NOT DETECTED Final   Yersinia enterocolitica NOT DETECTED NOT DETECTED Final   Vibrio species NOT DETECTED NOT DETECTED Final   Vibrio cholerae NOT DETECTED NOT DETECTED Final   Enteroaggregative E coli (EAEC) NOT DETECTED NOT DETECTED Final   Enteropathogenic E coli (EPEC) NOT DETECTED NOT DETECTED Final   Enterotoxigenic E coli (ETEC) NOT DETECTED NOT DETECTED Final   Shiga like toxin producing E coli (STEC) NOT DETECTED NOT DETECTED Final   Shigella/Enteroinvasive E coli (EIEC) NOT DETECTED NOT DETECTED Final   Cryptosporidium NOT DETECTED NOT DETECTED Final   Cyclospora cayetanensis NOT DETECTED NOT DETECTED Final   Entamoeba histolytica NOT DETECTED NOT DETECTED Final   Giardia  lamblia NOT DETECTED NOT DETECTED Final   Adenovirus F40/41 NOT DETECTED NOT DETECTED Final   Astrovirus NOT DETECTED NOT DETECTED Final   Norovirus GI/GII NOT DETECTED NOT DETECTED Final   Rotavirus A NOT DETECTED NOT DETECTED Final   Sapovirus (I, II, IV, and V) NOT DETECTED NOT DETECTED Final    Comment: Performed at [facility], [place], [place] [address]  C Difficile Quick Screen w PCR reflex     Status: None   Collection Time: 05/10/22 12:34 PM   Specimen: STOOL  Result Value Ref Range Status   C Diff antigen NEGATIVE NEGATIVE Final   C Diff toxin NEGATIVE NEGATIVE Final   C Diff interpretation No C. difficile detected.  Final    Comment: Performed at [facility], [address] [place], [place] [address]  Culture, routine-genital     Status: None (Preliminary result)   Collection Time: 05/11/22  2:47 PM   Specimen: Urethra  Result Value Ref Range Status   Specimen Description   Final    URETHRA Performed at [facility], [address], [place], [place] [address]    Special Requests   Final    NONE Performed at [facility], [address] [place], [place] [address]    Culture   Final    TOO YOUNG TO READ Performed at [facility], [place] [address] [place], [place] [address]    Report Status PENDING  Incomplete         Radiology Studies: CT ABDOMEN PELVIS W CONTRAST  Result Date: 05/10/2022 CLINICAL DATA:  Abdominal pain, diarrhea for 3 days, antibiotic treatment for penile infection around urinary catheter * Tracking Code: BO * EXAM: CT ABDOMEN AND PELVIS WITH CONTRAST TECHNIQUE: Multidetector CT imaging of the abdomen and pelvis was performed using the standard protocol following bolus administration of intravenous contrast. RADIATION DOSE REDUCTION: This exam was performed according to the departmental dose-optimization program which includes automated exposure control, adjustment of the mA and/or kV  according to patient size and/or use of iterative reconstruction technique. CONTRAST:  46m OMNIPAQUE IOHEXOL 300 MG/ML  SOLN COMPARISON:  04/23/2022 FINDINGS: Lower chest: No acute abnormality. Severe emphysema. Three-vessel coronary artery calcifications. Small hiatal hernia. Hepatobiliary: No focal liver abnormality is seen. Status post cholecystectomy. Postop biliary dilatation. Pancreas: Unremarkable. No pancreatic ductal dilatation or surrounding inflammatory changes. Spleen: Normal in size without significant abnormality. Adrenals/Urinary Tract: Adrenal glands are unremarkable. Mild right hydronephrosis and hydroureter, similar to prior examination. No left-sided hydronephrosis. Severely thickened urinary bladder wall. Foley catheter in the bladder. Stomach/Bowel: Stomach is within normal limits. Diverticulum of the descending duodenum. Appendix is not clearly visualized. No evidence of bowel wall  thickening, distention, or inflammatory changes. Interval resolution of previously seen pneumatosis of the proximal to mid small bowel. Descending and sigmoid diverticulosis. Colon is fluid-filled to the rectum. Vascular/Lymphatic: Aortic atherosclerosis. Infrarenal abdominal aortic aneurysm status post aortobiiliac stent endograft repair, unchanged. Enlarged, necrotic appearing perirectal right pelvic sidewall, and right iliac lymph nodes, largest right iliac node measuring 2.8 x 2.6 cm (series 3, image 64). Reproductive: Severe prostatomegaly. Extensive, nodular tissue about the prostate, adjacent bladder base, and rectum (series 3, image 69). Other: Large, fat containing bilateral inguinal hernias, partially imaged. No ascites. Musculoskeletal: No acute or significant osseous findings. IMPRESSION: 1. Severe prostatomegaly with extensive, nodular tissue about the prostate, adjacent bladder base, and rectum. 2. Enlarged, necrotic appearing perirectal, right pelvic sidewall, and right iliac lymph nodes. 3. Findings  are most consistent with locally advanced prostate or rectal malignancy with direct involvement of adjacent organs and nodal metastatic disease. 4. Severely thickened urinary bladder wall with Foley catheter. 5. Mild right hydronephrosis and hydroureter, similar to prior examination, presumably secondary to obstruction of the distal right ureter by pelvic malignancy. 6. Interval resolution of previously seen pneumatosis of the proximal to mid small bowel. 7. Fluid-filled colon, in keeping with diarrheal illness. 8. Severe emphysema. 9. Coronary artery disease. Aortic Atherosclerosis (ICD10-I70.0) and Emphysema (ICD10-J43.9). Electronically Signed   By: Delanna Ahmadi M.D.   On: 05/10/2022 13:42        Scheduled Meds:  amLODipine  5 mg Oral Daily   bicalutamide  50 mg Oral Daily   calcium-vitamin D  1 tablet Oral BID   cyanocobalamin  500 mcg Oral Daily   DULoxetine  20 mg Oral BID   enoxaparin (LOVENOX) injection  40 mg Subcutaneous Q24H   fluconazole  200 mg Oral Daily   gabapentin  100 mg Oral QHS   galantamine  16 mg Oral Q breakfast   ketorolac  15 mg Intravenous Q6H   loratadine  10 mg Oral Daily   magnesium oxide  400 mg Oral Daily   memantine  10 mg Oral BID   multivitamin with minerals  1 tablet Oral Daily   tamsulosin  0.4 mg Oral QHS   traZODone  50 mg Oral QHS   Continuous Infusions:  sodium chloride 75 mL/hr at 05/12/22 0923   aztreonam 2 g (05/12/22 0641)     LOS: 2 days      Sidney Ace, MD [facility]   If 7PM-7AM, please contact night-coverage  05/12/2022, 12:23 PM

## 2022-05-12 NOTE — Plan of Care (Signed)

## 2022-05-13 DIAGNOSIS — N481 Balanitis: Secondary | ICD-10-CM | POA: Diagnosis not present

## 2022-05-13 DIAGNOSIS — R310 Gross hematuria: Secondary | ICD-10-CM | POA: Diagnosis not present

## 2022-05-13 LAB — CBC
HCT: 37.1 % — ABNORMAL LOW (ref 39.0–52.0)
Hemoglobin: 12.2 g/dL — ABNORMAL LOW (ref 13.0–17.0)
MCH: 31.7 pg (ref 26.0–34.0)
MCHC: 32.9 g/dL (ref 30.0–36.0)
MCV: 96.4 fL (ref 80.0–100.0)
Platelets: 277 10*3/uL (ref 150–400)
RBC: 3.85 MIL/uL — ABNORMAL LOW (ref 4.22–5.81)
RDW: 13 % (ref 11.5–15.5)
WBC: 9 10*3/uL (ref 4.0–10.5)
nRBC: 0 % (ref 0.0–0.2)

## 2022-05-13 LAB — BASIC METABOLIC PANEL
Anion gap: 5 (ref 5–15)
BUN: 17 mg/dL (ref 8–23)
CO2: 26 mmol/L (ref 22–32)
Calcium: 8.2 mg/dL — ABNORMAL LOW (ref 8.9–10.3)
Chloride: 105 mmol/L (ref 98–111)
Creatinine, Ser: 0.85 mg/dL (ref 0.61–1.24)
GFR, Estimated: >60 mL/min (ref >60)
Glucose, Bld: 78 mg/dL (ref 70–99)
Potassium: 4.2 mmol/L (ref 3.5–5.1)
Sodium: 136 mmol/L (ref 135–145)

## 2022-05-13 LAB — HEMOGLOBIN: Hemoglobin: 13 g/dL (ref 13.0–17.0)

## 2022-05-13 MED ORDER — SODIUM CHLORIDE 0.9 % IR SOLN: 3000.0000 mL | Status: DC

## 2022-05-13 NOTE — Progress Notes (Signed)
PROGRESS NOTE    Steven Elliott  ZOX:[id number] DOB: 12-08-1929 DOA: 05/10/2022 PCP: [name]    Brief Narrative:  88 y.o. 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   Patient states that because of penile swelling, he was sent in ED on 8/14, and diagnosed with balanitis. He was started on Keflex on 8/14.  He developed diarrhea 3 days ago, which has been persistent.  He has more than 10 times of watery diarrhea each day.  No nausea, vomiting or abdominal pain.  No fever or chills.  Due to urinary retention, patient has Foley catheter placement, which is changed in ED today.  Patient does not have chest pain, cough, shortness of breath. Pt still has penile swelling, pain and erythema.  He has purulent discharge around the urethra.     Patient is [facility] patient.  ED physician tried to transfer patient to [facility], but they do not have beds. If we are notified that the patient has a bed, he will be transferred.  Started on Azactam and intravenous fluids  8/23: Patient had acute onset bright red blood in Foley bag associated with passage of clots.  Urology contacted by cross cover overnight.  Recommended initiation of CBI.  This morning that was not done however bedside RN informed that urology was at bedside early this morning, flush Foley catheter and did not recommend any further intervention.  Hemoglobin relatively stable.   Assessment & Plan:   Principal Problem:   Balanitis Active Problems:   UTI (urinary tract infection)   Hypertension   BPH (benign prostatic hyperplasia)   Diarrhea   Abnormal LFTs   COPD (chronic obstructive pulmonary disease) (HCC)   Chronic kidney disease, stage 3a (HCC)   Prostate cancer (HCC)   Dementia (HCC)   Urethritis  Acute balanitis Urinary tract infection 1 to July due to the  exposure to antibiotics previously urine culture shows no growth Patient endorses severe allergy to penicillins and refused cefepime Plan: Continue Azactam, empiric 7-day course New IVF Continue Foley catheter, will likely discharge with Foley catheter in place Monitor vitals and fever curve  Acute hematuria Noted on Foley bag and on 8/22 Suspect secondary to traumatic Foley insertion Worsening of hematuria associated with clots on 8/23 Urology consulted, flushed catheter at bedside Monitor closely, every 12 hour hemoglobins  Essential hypertension PTA amlodipine Hold hydrochlorothiazide As needed IV hydralazine  BPH Significant urinary retention, worsened by underlying balanitis PTA Flomax Continue Foley catheter Anticipate discharge with Foley catheter in place and outpatient follow-up with urology for voiding trial  Abnormal LFTs Unclear etiology Downtrending Possible mild ischemic hepatopathy versus medication effect  COPD (chronic obstructive pulmonary disease) (HCC) Stable PTA bronchodilators   Chronic kidney disease, stage 3a ([place]) Renal function close to baseline  Prostate cancer [facility]) Patient has metastasized prostate cancer. -Follow-up with urology/oncology in [place] home Casodex   Dementia ([place]) No behavior disturbance -Continue home Namenda, galantamine     DVT prophylaxis: SCDs, chemoprophylaxis on hold until hematuria resolves Code Status: Full Family Communication: Daughter at bedside 8/22, 8/23 Disposition Plan: Status is: Inpatient Remains inpatient appropriate because: Acute balanitis on IV antibiotics.  Acute hematuria.  BPH with Foley catheter in place.   Level of care: Med-Surg  Consultants:  None  Procedures:  Foley catheter insertion  Antimicrobials: Aztreonam   Subjective: Seen and examined.  Resting comfortably in bed.  Feels well this morning.  No distress.  Objective: Vitals:   05/12/22 1600 05/12/22  2028 05/13/22 0631 05/13/22 0742  BP: (!) 107/51 130/67 (!) 130/56 129/61  Pulse: 76 87 94 96  Resp: '16 17 17 16  '$ Temp: 97.7 F (36.5 C) 98.5 F (36.9 C) 98.4 F (36.9 C) 97.8 F (36.6 C)  TempSrc:      SpO2: 97% 97% 93% 94%  Height:        Intake/Output Summary (Last 24 hours) at 05/13/2022 1134 Last data filed at 05/13/2022 0504 Gross per 24 hour  Intake 1333.13 ml  Output 1150 ml  Net 183.13 ml   There were no vitals filed for this visit.  Examination:  General exam: NAD Respiratory system: Lungs clear.  Normal work of breathing.  Room air Cardiovascular system: S1-S2, RRR, no murmurs, no pedal edema Gastrointestinal system: Soft, NT/ND, normal bowel sounds GU: Foley catheter in place, draining blood-tinged urine.  Significant swelling of penile head noted Central nervous system: Alert, oriented x2, no focal deficits Extremities: Symmetric 5 x 5 power. Skin: No rashes, lesions or ulcers Psychiatry: Judgement and insight appear normal. Mood & affect appropriate.     Data Reviewed: I have personally reviewed following labs and imaging studies  CBC: Recent Labs  Lab 05/10/22 1153 05/11/22 0232 05/12/22 0409 05/13/22 0406  WBC 11.5* 11.8* 9.1 9.0  NEUTROABS  --   --  6.8  --   HGB 13.1 12.9* 12.6* 12.2*  HCT 40.4 39.1 38.6* 37.1*  MCV 97.6 94.9 95.5 96.4  PLT 299 317 303 371   Basic Metabolic Panel: Recent Labs  Lab 05/10/22 1153 05/11/22 0232 05/12/22 0409 05/13/22 0406  NA 135 137 137 136  K 4.2 4.0 4.3 4.2  CL 103 104 104 105  CO2 '24 25 27 26  '$ GLUCOSE 93 105* 93 78  BUN '18 13 15 17  '$ CREATININE 1.17 0.87 0.90 0.85  CALCIUM 8.6* 8.5* 8.8* 8.2*   GFR: Estimated Creatinine Clearance: 55.9 mL/min (by C-G formula based on SCr of 0.85 mg/dL). Liver Function Tests: Recent Labs  Lab 05/10/22 1153 05/11/22 0232 05/12/22 0409  AST 152* 92* 47*  ALT 81* 78* 54*  ALKPHOS 253* 232* 195*  BILITOT 1.1 0.9 0.7  PROT 7.7 7.6 7.2  ALBUMIN 3.7 3.5 3.3*    Recent Labs  Lab 05/10/22 1153  LIPASE 26   No results for input(s): "AMMONIA" in the last 168 hours. Coagulation Profile: No results for input(s): "INR", "PROTIME" in the last 168 hours. Cardiac Enzymes: No results for input(s): "CKTOTAL", "CKMB", "CKMBINDEX", "TROPONINI" in the last 168 hours. BNP (last 3 results) No results for input(s): "PROBNP" in the last 8760 hours. HbA1C: No results for input(s): "HGBA1C" in the last 72 hours. CBG: No results for input(s): "GLUCAP" in the last 168 hours. Lipid Profile: No results for input(s): "CHOL", "HDL", "LDLCALC", "TRIG", "CHOLHDL", "LDLDIRECT" in the last 72 hours. Thyroid Function Tests: No results for input(s): "TSH", "T4TOTAL", "FREET4", "T3FREE", "THYROIDAB" in the last 72 hours. Anemia Panel: No results for input(s): "VITAMINB12", "FOLATE", "FERRITIN", "TIBC", "IRON", "RETICCTPCT" in the last 72 hours. Sepsis Labs: Recent Labs  Lab 05/10/22 1234 05/10/22 1851  LATICACIDVEN 0.9 1.5    Recent Results (from the past 240 hour(s))  Urine Culture     Status: None   Collection Time: 05/10/22 12:34 PM   Specimen: Urine, Clean Catch  Result Value Ref Range Status   Specimen Description   Final    URINE, CLEAN CATCH  Performed at [facility], [address], [place], [place] [address]    Special Requests   Final    NONE Performed at [facility] [address], [place], [place] [address]    Culture   Final    NO GROWTH Performed at [facility], [place] [address], [place], [place] [address]    Report Status 05/11/2022 FINAL  Final  Gastrointestinal Panel by PCR , Stool     Status: None   Collection Time: 05/10/22 12:34 PM   Specimen: Stool  Result Value Ref Range Status   Campylobacter species NOT DETECTED NOT DETECTED Final   Plesimonas shigelloides NOT DETECTED NOT DETECTED Final   Salmonella species NOT DETECTED NOT DETECTED Final   Yersinia enterocolitica NOT DETECTED NOT DETECTED Final    Vibrio species NOT DETECTED NOT DETECTED Final   Vibrio cholerae NOT DETECTED NOT DETECTED Final   Enteroaggregative E coli (EAEC) NOT DETECTED NOT DETECTED Final   Enteropathogenic E coli (EPEC) NOT DETECTED NOT DETECTED Final   Enterotoxigenic E coli (ETEC) NOT DETECTED NOT DETECTED Final   Shiga like toxin producing E coli (STEC) NOT DETECTED NOT DETECTED Final   Shigella/Enteroinvasive E coli (EIEC) NOT DETECTED NOT DETECTED Final   Cryptosporidium NOT DETECTED NOT DETECTED Final   Cyclospora cayetanensis NOT DETECTED NOT DETECTED Final   Entamoeba histolytica NOT DETECTED NOT DETECTED Final   Giardia lamblia NOT DETECTED NOT DETECTED Final   Adenovirus F40/41 NOT DETECTED NOT DETECTED Final   Astrovirus NOT DETECTED NOT DETECTED Final   Norovirus GI/GII NOT DETECTED NOT DETECTED Final   Rotavirus A NOT DETECTED NOT DETECTED Final   Sapovirus (I, II, IV, and V) NOT DETECTED NOT DETECTED Final    Comment: Performed at [facility], [place] [place], [place] [address]  C Difficile Quick Screen w PCR reflex     Status: None   Collection Time: 05/10/22 12:34 PM   Specimen: STOOL  Result Value Ref Range Status   C Diff antigen NEGATIVE NEGATIVE Final   C Diff toxin NEGATIVE NEGATIVE Final   C Diff interpretation No C. difficile detected.  Final    Comment: Performed at [facility], [address] [place], [place] [address]  Culture, routine-genital     Status: None (Preliminary result)   Collection Time: 05/11/22  2:47 PM   Specimen: Urethra  Result Value Ref Range Status   Specimen Description   Final    URETHRA Performed at [facility], [address], [place], [place] [address]    Special Requests   Final    NONE Performed at [facility], [address], [place],  [address]    Culture   Final    CULTURE REINCUBATED FOR BETTER GROWTH Performed at [facility], [place] [address] [place],  [address]     Report Status PENDING  Incomplete         Radiology Studies: No results found.      Scheduled Meds:  amLODipine  5 mg Oral Daily   bicalutamide  50 mg Oral Daily   calcium-vitamin D  1 tablet Oral BID   cyanocobalamin  500 mcg Oral Daily   DULoxetine  20 mg Oral BID   fluconazole  200 mg Oral Daily   gabapentin  100 mg Oral QHS   galantamine  16 mg Oral Q breakfast   loratadine  10 mg Oral Daily   magnesium oxide  400 mg Oral  Daily   memantine  10 mg Oral BID   multivitamin with minerals  1 tablet Oral Daily   tamsulosin  0.4 mg Oral QHS   traZODone  50 mg Oral QHS   Continuous Infusions:  sodium chloride 75 mL/hr at 05/13/22 0646   aztreonam 2 g (05/13/22 0648)   sodium chloride irrigation       LOS: 3 days      Sidney Ace, MD [facility]   If 7PM-7AM, please contact night-coverage  05/13/2022, 11:34 AM

## 2022-05-13 NOTE — Progress Notes (Signed)
I saw the patient and his daughter at the bedside this afternoon due to reports of nondraining catheter that was not able to be irrigated. They report the catheter was not draining well until he passed a clot, but it is irrigating well now. Red urine draining into the catheter tubing upon my arrival.  I irrigated the catheter with 250ccs of sterile water and was able to clear about 5ccs of clot fragments. Catheter irrigated easily and efflux cleared from red to pink.  Will continue to monitor and consider catheter upsizing with ongoing clot passage, as daughter is concerned about her ability to perform catheter irrigation at home. I do not think CBI is warranted at this time.  Debroah Loop, PA-C  05/13/22 4:56 PM

## 2022-05-13 NOTE — TOC Progression Note (Signed)
Transition of Care [facility]) - Progression Note    Patient Details  Name: BRADON FESTER MRN: [id number] Date of Birth: July 17, 1930  [facility] CM/SW Contact  Laurena Slimmer, RN Phone Number: 05/13/2022, 3:30 PM  Clinical Narrative:    Per Floydene Flock of [place] Patient is active with [facility] RN.       Expected Discharge Plan and Services                                                 Social Determinants of Health (SDOH) Interventions    Readmission Risk Interventions     No data to display

## 2022-05-13 NOTE — Plan of Care (Addendum)
       CROSS COVER NOTE  NAME: Steven Elliott MRN: [id number] DOB : [birth year]    Date of Service   05/13/22  HPI/Events of Note   Passing clots through foley and urethra. Otherwise at baseline    05/12/2022    8:28 PM 05/12/2022    4:00 PM 05/12/2022    8:47 AM  Vitals with BMI  Systolic 202 542 706  Diastolic 67 51 62  Pulse 87 76 87     Assessment and  Interventions   Assessment: Gross hematuria with clots: 88 y.o. male with medical history significant of metastasized prostate cancer on oral chemotherapy  admitted on 8/14, because of penile swelling with balanitis with placement of Foley in ED for acute urinary retention. He developed Gross hematuria on 8/22 now with large clots in spite of bladder irrigation suspect secondary to traumatic Foley insertion  Plan: Update vitals and monitor Get H/H Start CBI Urgent consult to urology to Dr Bernardo Heater Discussed with Dr Bernardo Heater on phone and secure chat and he agrees with CBI once three way catheter is placed. He advises to "please make him npo in case cbi not effective and he needs to go to OR"

## 2022-05-13 NOTE — Consult Note (Signed)
Pharmacy Antibiotic Note  Steven Elliott is a 88 y.o. male admitted on 05/10/2022 with  ongoing diarrhea and penile pain .  Patient was diagnosed with metastatic prostate cancer and has indwelling foley catheter (placed 05/10/2022). He was recently evaluated for balanitis and placed on cephalexin for treatment. Patient reported severe allergy and refused to be treated with Cefepime (tolerated Zosyn on 8/3). Aztreonam will be continued for this reason. Renal function is stable and at baseline (WNL). Day 3 of antibiotics. Pharmacy has been consulted for Aztreonam dosing.   Plan: Continue Aztreonam 2g IV Q8H. Follow up Urethral cultures. Monitor renal function while on aztreonam.  Height: '5\' 8"'$  (172.7 cm) IBW/kg (Calculated) : 68.4  Temp (24hrs), Avg:98.2 F (36.8 C), Min:97.7 F (36.5 C), Max:98.5 F (36.9 C)  Recent Labs  Lab 05/10/22 1153 05/10/22 1234 05/10/22 1851 05/11/22 0232 05/12/22 0409 05/13/22 0406  WBC 11.5*  --   --  11.8* 9.1 9.0  CREATININE 1.17  --   --  0.87 0.90 0.85  LATICACIDVEN  --  0.9 1.5  --   --   --      Estimated Creatinine Clearance: 55.9 mL/min (by C-G formula based on SCr of 0.85 mg/dL).    Allergies  Allergen Reactions   Procaine Anaphylaxis   Ciprofloxacin Other (See Comments) and Nausea And Vomiting    Malaise, Rigor   Hydrocodone     Other reaction(s): Rash, Hives, Heart palpitations ()   Hydrocodone-Acetaminophen Itching    Watery eyes   Lisinopril Other (See Comments)   Omeprazole Other (See Comments)    GI reaction   Penicillins Other (See Comments)   Rabeprazole Other (See Comments)    GI reaction    Antimicrobials this admission: Aztreonam 8/20 >>    Dose adjustments this admission: N/A  Microbiology results: 8/20 UCx: NG final 8/20 GIPP: negative  8/20 Cdiff quick scan: negative 8/21 Urethral cx: re-incubated for better growth  Thank you for allowing pharmacy to be a part of this patient's care.  Gretel Acre,  PharmD PGY1 Pharmacy Resident 05/13/2022 8:09 AM

## 2022-05-13 NOTE — Consult Note (Signed)
Urology Consult  Requesting physician: Judd Gaudier, MD  Reason consultation: Hematuria  Chief Complaint: N/A  History of Present Illness: Steven Elliott is a 88 y.o. male with a history of metastatic prostate cancer followed by the [name].  Recent episode of urinary retention with a Foley catheter placed at the [name].  He also has a history of penile swelling and has been treated for balanitis at an ED visit on 05/04/2022.  Admitted 8/20 for diarrhea and penile pain.  Catheter was changed in the ED on 05/10/2022.  I was contacted by the hospitalist this morning at 4 AM after patient developed blood in the Foley catheter tubing and around the penis.  She was inquiring if CBI could be tried.  I thought that would be reasonable however she contacted me this morning stating a three-way Foley catheter was not placed due to penile swelling and his catheter was irrigated with significant improvement in his hematuria.  It appears the catheter was only been flushed with 10 cc saline and not truly irrigated.  When I saw the patient this morning I irrigated 2-3 small stringy clots and the catheter irrigated freely with pink-tinged effluent and gave instructions to manually irrigate as needed.  He states he has been circumcised and that the head of his penis has always been exposed.    CT on admission showed prostate enlargement and pelvic adenopathy.  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    Anosmia    BPH (benign prostatic hyperplasia)    COPD (chronic obstructive pulmonary disease) (HCC)    Diverticulosis    Emphysema (subcutaneous) (surgical) resulting from a procedure    severe by CT 03/20/16   GERD (gastroesophageal reflux disease)    History of bilateral inguinal hernias 02/2015   Hypertension    Peripheral vascular disease ([place]) 02/2015    heavily calcified but patent bil iliac arteries by CTA 03/20/16.    Past Surgical History:  Procedure Laterality Date   APPENDECTOMY     CHOLECYSTECTOMY     COLONOSCOPY  ~ 2007   at [place] Left 2014    Home Medications:  Current Meds  Medication Sig   acetaminophen (TYLENOL) 500 MG tablet Take 500 mg by mouth every 6 (six) hours as needed.   albuterol (PROVENTIL HFA;VENTOLIN HFA) 108 (90 Base) MCG/ACT inhaler Inhale 2 puffs into the lungs every 6 (six) hours as needed for wheezing or shortness of breath.   amLODipine (NORVASC) 5 MG tablet TAKE ONE TABLET BY MOUTH DAILY FOR BLOOD PRESSURE.   bicalutamide (CASODEX) 50 MG tablet Take 50 mg by mouth daily.   Calcium Carbonate-Vitamin D (CALCIUM-D) 600-400 MG-UNIT TABS Take 1 tablet by mouth 2 (two) times daily.   [EXPIRED] cephALEXin (KEFLEX) 500 MG capsule Take 1 capsule (500 mg total) by mouth 4 (four) times daily for 7 days.   cetirizine (ZYRTEC) 5 MG tablet Take 1 tablet (5 mg total) by mouth daily.   clotrimazole (LOTRIMIN) 1 % cream Apply 1 Application topically 2 (two) times daily.   cyclobenzaprine (FLEXERIL) 5 MG tablet Take 5 mg by mouth 3 (three) times daily as needed for muscle spasms.   DULoxetine (CYMBALTA) 20 MG capsule Take 20 mg by mouth 2 (two) times daily.  gabapentin (NEURONTIN) 100 MG capsule Take 200 mg by mouth at bedtime.   galantamine (RAZADYNE ER) 24 MG 24 hr capsule Take 24 mg by mouth daily with breakfast.   hydrochlorothiazide (HYDRODIURIL) 25 MG tablet Take 12.5 mg by mouth daily.   ketoconazole (NIZORAL) 2 % shampoo SHAMPOO AS DIRECTED AFFECTED AREA THREE TIMES A WEEK AS NEEDED (LEAVE ON AFFECTED AREA FOR 10 MINUTES, THEN RINSE OFF WITH WATER) MAY USE AS A FACE WASH DAILY TO SCALY SPOTS   magnesium oxide (MAG-OX) 400 (240 Mg) MG tablet Take 400 mg by mouth daily.   memantine (NAMENDA) 10 MG tablet Take 10 mg by mouth 2 (two) times daily.   Multiple  Vitamin (MULTIVITAMIN WITH MINERALS) TABS tablet Take 1 tablet by mouth daily.   tamsulosin (FLOMAX) 0.4 MG CAPS capsule Take 0.4 mg by mouth at bedtime.   traZODone (DESYREL) 50 MG tablet TAKE ONE TABLET BY MOUTH AT BEDTIME --MAY START WITH QUARTER TABLETS, MAY PROGRESS TO WHOLE TABLETS FOR INSOMNIA   vitamin B-12 (CYANOCOBALAMIN) 500 MCG tablet Take 1 tablet by mouth daily.    Allergies:  Allergies  Allergen Reactions   Procaine Anaphylaxis   Ciprofloxacin Other (See Comments) and Nausea And Vomiting    Malaise, Rigor   Hydrocodone     Other reaction(s): Rash, Hives, Heart palpitations ()   Hydrocodone-Acetaminophen Itching    Watery eyes   Lisinopril Other (See Comments)   Omeprazole Other (See Comments)    GI reaction   Penicillins Other (See Comments)   Rabeprazole Other (See Comments)    GI reaction    Family History  Problem Relation Age of Onset   Breast cancer Mother    Pancreatitis Mother    Breast cancer Sister     Social History:  reports that he has quit smoking. His smoking use included cigarettes and cigars. He has never used smokeless tobacco. He reports current alcohol use. He reports that he does not use drugs.  ROS: Otherwise noncontributory except as per the HPI  Physical Exam:  Vital signs in last 24 hours: Temp:  [97.7 F (36.5 C)-98.5 F (36.9 C)] 97.8 F (36.6 C) (08/23 0742) Pulse Rate:  [76-96] 96 (08/23 0742) Resp:  [16-17] 16 (08/23 0742) BP: (107-130)/(51-67) 129/61 (08/23 0742) SpO2:  [93 %-97 %] 94 % (08/23 0742) Constitutional:  Alert, No acute distress HEENT: [place] AT GI: Abdomen is soft, nontender, nondistended, no abdominal masses GU: Phallus with mild edema of the glans but no erythema or tenderness.  No phimotic ring.  No penile tenderness.  Foley catheter was irrigated as above   Laboratory Data:  Recent Labs    05/11/22 0232 05/12/22 0409 05/13/22 0406  WBC 11.8* 9.1 9.0  HGB 12.9* 12.6* 12.2*  HCT 39.1 38.6* 37.1*    Recent Labs    05/11/22 0232 05/12/22 0409 05/13/22 0406  NA 137 137 136  K 4.0 4.3 4.2  CL 104 104 105  CO2 '25 27 26  '$ GLUCOSE 105* 93 78  BUN '13 15 17  '$ CREATININE 0.87 0.90 0.85  CALCIUM 8.5* 8.8* 8.2*    Radiologic Imaging: CT images were personally reviewed  Impression/Recommendation:   1.  Gross hematuria Unlikely related to traumatic Foley catheter change but to prostate cancer progression and bladder invasion.  Also noted to have significant BPH Recommend manual irrigation as needed as catheter currently irrigates freely May need to upsize for continued hematuria Cystoscopy/fulguration for progressing hematuria not controlled with catheter drainage or CBI  2.  Penile edema Do not feel this is balanitis but may be related to Foley catheter and prostate cancer  3.  Metastatic prostate cancer Currently managed by the [facility]    05/13/2022, 1:16 PM  John Giovanni,  MD

## 2022-05-13 NOTE — Plan of Care (Signed)
  Problem: Education: Goal: Knowledge of General Education information will improve Description: Including pain rating scale, medication(s)/side effects and non-pharmacologic comfort measures Outcome: Progressing   Problem: Clinical Measurements: Goal: Ability to maintain clinical measurements within normal limits will improve Outcome: Progressing   Problem: Activity: Goal: Risk for activity intolerance will decrease Outcome: Progressing   Problem: Nutrition: Goal: Adequate nutrition will be maintained Outcome: Progressing   Problem: Coping: Goal: Level of anxiety will decrease Outcome: Progressing   Problem: Elimination: Goal: Will not experience complications related to bowel motility Outcome: Progressing   Problem: Safety: Goal: Ability to remain free from injury will improve Outcome: Progressing   Problem: Skin Integrity: Goal: Risk for impaired skin integrity will decrease Outcome: Progressing

## 2022-05-14 DIAGNOSIS — T3695XA Adverse effect of unspecified systemic antibiotic, initial encounter: Secondary | ICD-10-CM | POA: Diagnosis not present

## 2022-05-14 DIAGNOSIS — R310 Gross hematuria: Secondary | ICD-10-CM

## 2022-05-14 DIAGNOSIS — N481 Balanitis: Secondary | ICD-10-CM | POA: Diagnosis not present

## 2022-05-14 DIAGNOSIS — K521 Toxic gastroenteritis and colitis: Secondary | ICD-10-CM | POA: Diagnosis not present

## 2022-05-14 LAB — BASIC METABOLIC PANEL
Anion gap: 4 — ABNORMAL LOW (ref 5–15)
BUN: 17 mg/dL (ref 8–23)
CO2: 25 mmol/L (ref 22–32)
Calcium: 8.3 mg/dL — ABNORMAL LOW (ref 8.9–10.3)
Chloride: 109 mmol/L (ref 98–111)
Creatinine, Ser: 0.83 mg/dL (ref 0.61–1.24)
GFR, Estimated: >60 mL/min (ref >60)
Glucose, Bld: 91 mg/dL (ref 70–99)
Potassium: 4.2 mmol/L (ref 3.5–5.1)
Sodium: 138 mmol/L (ref 135–145)

## 2022-05-14 LAB — CULTURE, ROUTINE-GENITAL

## 2022-05-14 LAB — CBC WITH DIFFERENTIAL/PLATELET
Abs Immature Granulocytes: 0.06 10*3/uL (ref 0.00–0.07)
Basophils Absolute: 0 10*3/uL (ref 0.0–0.1)
Basophils Relative: 0 %
Eosinophils Absolute: 0.2 10*3/uL (ref 0.0–0.5)
Eosinophils Relative: 2 %
HCT: 35.1 % — ABNORMAL LOW (ref 39.0–52.0)
Hemoglobin: 11.6 g/dL — ABNORMAL LOW (ref 13.0–17.0)
Immature Granulocytes: 1 %
Lymphocytes Relative: 13 %
Lymphs Abs: 1.2 10*3/uL (ref 0.7–4.0)
MCH: 31.5 pg (ref 26.0–34.0)
MCHC: 33 g/dL (ref 30.0–36.0)
MCV: 95.4 fL (ref 80.0–100.0)
Monocytes Absolute: 0.7 10*3/uL (ref 0.1–1.0)
Monocytes Relative: 8 %
Neutro Abs: 7.4 10*3/uL (ref 1.7–7.7)
Neutrophils Relative %: 76 %
Platelets: 252 10*3/uL (ref 150–400)
RBC: 3.68 MIL/uL — ABNORMAL LOW (ref 4.22–5.81)
RDW: 12.8 % (ref 11.5–15.5)
WBC: 9.7 10*3/uL (ref 4.0–10.5)
nRBC: 0 % (ref 0.0–0.2)

## 2022-05-14 LAB — HEMOGLOBIN: Hemoglobin: 11.6 g/dL — ABNORMAL LOW (ref 13.0–17.0)

## 2022-05-14 MED ORDER — CHLORHEXIDINE GLUCONATE CLOTH 2 % EX PADS
6.0000 | MEDICATED_PAD | Freq: Every day | CUTANEOUS | Status: DC
  Administered 2022-05-15 – 2022-05-22 (×7): 6 via TOPICAL

## 2022-05-14 MED ORDER — OXYCODONE HCL 5 MG PO TABS
5.0000 mg | ORAL_TABLET | ORAL | Status: DC | PRN
  Administered 2022-05-18 – 2022-05-20 (×2): 5 mg via ORAL
  Filled 2022-05-14 (×2): qty 1

## 2022-05-14 MED ORDER — MORPHINE SULFATE (PF) 2 MG/ML IV SOLN
2.0000 mg | INTRAVENOUS | Status: DC | PRN
  Administered 2022-05-18: 2 mg via INTRAVENOUS
  Filled 2022-05-14: qty 1

## 2022-05-14 NOTE — Progress Notes (Signed)
Urology Inpatient Progress Note  Subjective: No acute events overnight. He is afebrile, VSS. A.m. labs pending.  Hemoglobin yesterday afternoon was stable at 13.0. Foley catheter in place draining red urine.  No clots noted. He is a bit delirious this morning, telling me about a baseball coach who rounded on him this morning prior to my arrival.  He denies pain.  Anti-infectives: Anti-infectives (From admission, onward)    Start     Dose/Rate Route Frequency Ordered Stop   05/11/22 1500  fluconazole (DIFLUCAN) tablet 200 mg        200 mg Oral Daily 05/10/22 1734 05/18/22 0959   05/10/22 1800  aztreonam (AZACTAM) 2 g in sodium chloride 0.9 % 100 mL IVPB        2 g 200 mL/hr over 30 Minutes Intravenous Every 8 hours 05/10/22 1752 05/17/22 2159   05/10/22 1515  fluconazole (DIFLUCAN) tablet 200 mg        200 mg Oral  Once 05/10/22 1508 05/10/22 1558   05/10/22 1515  ceFEPIme (MAXIPIME) 2 g in sodium chloride 0.9 % 100 mL IVPB  Status:  Discontinued        2 g 200 mL/hr over 30 Minutes Intravenous  Once 05/10/22 1514 05/10/22 1606       Current Facility-Administered Medications  Medication Dose Route Frequency Provider Last Rate Last Admin   0.9 %  sodium chloride infusion   Intravenous Continuous Ralene Muskrat B, MD 75 mL/hr at 05/13/22 0646 New Bag at 05/13/22 0646   acetaminophen (TYLENOL) tablet 650 mg  650 mg Oral Q6H PRN Ralene Muskrat B, MD   650 mg at 05/13/22 1311   albuterol (PROVENTIL) (2.5 MG/3ML) 0.083% nebulizer solution 3 mL  3 mL Inhalation Q4H PRN Ivor Costa, MD       amLODipine (NORVASC) tablet 5 mg  5 mg Oral Daily Ivor Costa, MD   5 mg at 05/13/22 1035   aztreonam (AZACTAM) 2 g in sodium chloride 0.9 % 100 mL IVPB  2 g Intravenous Q8H Sreenath, Sudheer B, MD 200 mL/hr at 05/14/22 0548 2 g at 05/14/22 0548   bicalutamide (CASODEX) tablet 50 mg  50 mg Oral Daily Ivor Costa, MD   50 mg at 05/13/22 1036   calcium-vitamin D (OSCAL WITH D) 500-5 MG-MCG per tablet 1  tablet  1 tablet Oral BID Ivor Costa, MD   1 tablet at 05/13/22 2105   cyanocobalamin (VITAMIN B12) tablet 500 mcg  500 mcg Oral Daily Ivor Costa, MD   500 mcg at 05/13/22 1033   cyclobenzaprine (FLEXERIL) tablet 5 mg  5 mg Oral TID PRN Ivor Costa, MD   5 mg at 05/13/22 1940   dextromethorphan-guaiFENesin (Vici DM) 30-600 MG per 12 hr tablet 1 tablet  1 tablet Oral BID PRN Ivor Costa, MD       DULoxetine (CYMBALTA) DR capsule 20 mg  20 mg Oral BID Ivor Costa, MD   20 mg at 05/13/22 2110   fluconazole (DIFLUCAN) tablet 200 mg  200 mg Oral Daily Priscella Mann, Sudheer B, MD   200 mg at 05/13/22 1035   gabapentin (NEURONTIN) capsule 100 mg  100 mg Oral QHS Ivor Costa, MD   100 mg at 05/13/22 2110   galantamine (RAZADYNE ER) 24 hr capsule 16 mg  16 mg Oral Q breakfast Ivor Costa, MD   16 mg at 05/13/22 9509   hydrALAZINE (APRESOLINE) injection 5 mg  5 mg Intravenous Q2H PRN Ivor Costa, MD  loperamide (IMODIUM) capsule 2 mg  2 mg Oral BID PRN Ivor Costa, MD       loratadine (CLARITIN) tablet 10 mg  10 mg Oral Daily Ivor Costa, MD   10 mg at 05/13/22 1033   magnesium oxide (MAG-OX) tablet 400 mg  400 mg Oral Daily Ivor Costa, MD   400 mg at 05/13/22 1033   memantine (NAMENDA) tablet 10 mg  10 mg Oral BID Ivor Costa, MD   10 mg at 05/13/22 2104   multivitamin with minerals tablet 1 tablet  1 tablet Oral Daily Ivor Costa, MD   1 tablet at 05/13/22 1033   ondansetron (ZOFRAN) injection 4 mg  4 mg Intravenous Q8H PRN Ivor Costa, MD       sodium chloride irrigation 0.9 % 3,000 mL  3,000 mL Irrigation Continuous Judd Gaudier V, MD       tamsulosin (FLOMAX) capsule 0.4 mg  0.4 mg Oral QHS Ivor Costa, MD   0.4 mg at 05/13/22 2105   traZODone (DESYREL) tablet 50 mg  50 mg Oral Gordan Payment, MD   50 mg at 05/13/22 2109   Objective: Vital signs in last 24 hours: Temp:  [97.3 F (36.3 C)-98 F (36.7 C)] 98 F (36.7 C) (08/23 2118) Pulse Rate:  [86-88] 86 (08/23 2118) Resp:  [16-17] 17 (08/23 2118) BP:  (127-135)/(55-66) 135/66 (08/23 2118) SpO2:  [95 %-100 %] 100 % (08/23 2118)  Intake/Output from previous day: 08/23 0701 - 08/24 0700 In: -  Out: 1901 [Urine:1900; Stool:1] Intake/Output this shift: No intake/output data recorded.  Physical Exam Vitals and nursing note reviewed.  Constitutional:      General: He is not in acute distress.    Appearance: He is not ill-appearing, toxic-appearing or diaphoretic.  HENT:     Head: Normocephalic and atraumatic.  Pulmonary:     Effort: Pulmonary effort is normal. No respiratory distress.  Skin:    General: Skin is warm and dry.  Neurological:     Mental Status: He is alert. He is disoriented.  Psychiatric:        Mood and Affect: Mood normal.        Behavior: Behavior normal.    Lab Results:  Recent Labs    05/12/22 0409 05/13/22 0406 05/13/22 1359  WBC 9.1 9.0  --   HGB 12.6* 12.2* 13.0  HCT 38.6* 37.1*  --   PLT 303 277  --    BMET Recent Labs    05/12/22 0409 05/13/22 0406  NA 137 136  K 4.3 4.2  CL 104 105  CO2 27 26  GLUCOSE 93 78  BUN 15 17  CREATININE 0.90 0.85  CALCIUM 8.8* 8.2*   Assessment & Plan: 88 year old male with metastatic prostate cancer followed by the [facility] who recently developed urinary retention with Foley catheter in place now admitted with diarrhea due to antibiotics for balanitis who subsequently developed gross hematuria.  His Foley continues to drain this morning with stable gross hematuria.  Blood counts were stable as of yesterday afternoon, a.m. labs pending.  Recommend continued monitoring with manual irrigation of his Foley catheter as needed.  I continue to feel that CBI is not warranted, though may revisit this if his blood counts start to drop.  Ultimately, he should follow-up with the [facility] for voiding trial and management of his prostate cancer.  Debroah Loop, PA-C 05/14/2022

## 2022-05-14 NOTE — Progress Notes (Signed)
PROGRESS NOTE    Steven Elliott  IOE:[id number] DOB: 1929-09-30 DOA: 05/10/2022 PCP: [name]    Brief Narrative:  88 y.o. 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   Patient states that because of penile swelling, he was sent in ED on 8/14, and diagnosed with balanitis. He was started on Keflex on 8/14.  He developed diarrhea 3 days ago, which has been persistent.  He has more than 10 times of watery diarrhea each day.  No nausea, vomiting or abdominal pain.  No fever or chills.  Due to urinary retention, patient has Foley catheter placement, which is changed in ED today.  Patient does not have chest pain, cough, shortness of breath. Pt still has penile swelling, pain and erythema.  He has purulent discharge around the urethra.     Patient is [facility] patient.  ED physician tried to transfer patient to [facility], but they do not have beds. If we are notified that the patient has a bed, he will be transferred.  Started on Azactam and intravenous fluids  8/23: Patient had acute onset bright red blood in Foley bag associated with passage of clots.  Urology contacted by cross cover overnight.  Recommended initiation of CBI.  This morning that was not done however bedside RN informed that urology was at bedside early this morning, flush Foley catheter and did not recommend any further intervention.  Hemoglobin relatively stable.  8/24: Seen by urology this morning.  Foley catheter continues to drain.  Stable gross hematuria.  Downtrend in hemoglobin today.  Urology made aware.  Consider possible initiation of CBI versus upsizing of Foley catheter.   Assessment & Plan:   Principal Problem:   Balanitis Active Problems:   UTI (urinary tract infection)   Hypertension   BPH (benign prostatic hyperplasia)   Diarrhea   Abnormal  LFTs   COPD (chronic obstructive pulmonary disease) (HCC)   Chronic kidney disease, stage 3a (HCC)   Prostate cancer (HCC)   Dementia (HCC)   Urethritis  Acute balanitis Urinary tract infection 1 to July due to the exposure to antibiotics previously urine culture shows no growth Patient endorses severe allergy to penicillins and refused cefepime Plan: Continue Azactam, empiric 7-day course Continue IVF Continue Foley catheter, will likely discharge with Foley catheter in place Monitor vitals and fever curve [facility]  Acute hematuria Noted on Foley bag and on 8/22 Suspect secondary to traumatic Foley insertion versus malignancy Worsening of hematuria associated with clots on 8/23 Urology consulted, flushed catheter at bedside Foley catheter continues to drain stable blood-tinged urine Hemoglobin drifting downward Plan: Urology aware Check bladder scan Check p.m. hemoglobin We will consider CBI   Essential hypertension PTA amlodipine Hold hydrochlorothiazide As needed IV hydralazine  BPH Significant urinary retention, worsened by underlying balanitis PTA Flomax Continue Foley catheter Anticipate discharge with Foley catheter in place and outpatient follow-up with urology for voiding trial  Abnormal LFTs Unclear etiology Downtrending Possible mild ischemic hepatopathy versus medication effect  COPD (chronic obstructive pulmonary disease) (HCC) Stable PTA bronchodilators   Chronic kidney disease, stage 3a ([place]) Renal function close to baseline  Prostate cancer [facility]) Patient has metastasized prostate cancer. -Follow-up with urology/oncology in [place] home Casodex   Dementia ([place]) No behavior disturbance -Continue home Namenda, galantamine     DVT prophylaxis: SCDs, chemoprophylaxis on hold until hematuria resolves Code Status: Full  Family Communication: Daughter at bedside 8/22, 8/23 Disposition Plan: Status is:  Inpatient Remains inpatient appropriate because: Acute balanitis on IV antibiotics.  Acute hematuria.  BPH with Foley catheter in place.   Level of care: Med-Surg  Consultants:  None  Procedures:  Foley catheter insertion  Antimicrobials: Aztreonam   Subjective: Seen and examined.  Resting comfortably in bed.  Feels well this morning.  No distress.  No pain complaints.  Objective: Vitals:   05/13/22 0742 05/13/22 1821 05/13/22 2118 05/14/22 0847  BP: 129/61 (!) 127/55 135/66 (!) 100/43  Pulse: 96 88 86 84  Resp: '16 16 17 16  '$ Temp: 97.8 F (36.6 C) (!) 97.3 F (36.3 C) 98 F (36.7 C) 98.1 F (36.7 C)  TempSrc:      SpO2: 94% 95% 100% 96%  Height:        Intake/Output Summary (Last 24 hours) at 05/14/2022 1057 Last data filed at 05/14/2022 1054 Gross per 24 hour  Intake 480 ml  Output 3151 ml  Net -2671 ml   There were no vitals filed for this visit.  Examination:  General exam: No acute distress Respiratory system: Lungs clear.  Normal work of breathing.  Room air Cardiovascular system: S1-S2, RRR, no murmurs, no pedal edema Gastrointestinal system: Soft, NT/ND, normal bowel sounds GU: Foley catheter in place, draining blood-tinged urine.  Significant swelling of penile head noted Central nervous system: Alert, oriented x2, no focal deficits Extremities: Symmetric 5 x 5 power. Skin: No rashes, lesions or ulcers Psychiatry: Judgement and insight appear normal. Mood & affect appropriate.     Data Reviewed: I have personally reviewed following labs and imaging studies  CBC: Recent Labs  Lab 05/10/22 1153 05/11/22 0232 05/12/22 0409 05/13/22 0406 05/13/22 1359 05/14/22 0824  WBC 11.5* 11.8* 9.1 9.0  --  9.7  NEUTROABS  --   --  6.8  --   --  7.4  HGB 13.1 12.9* 12.6* 12.2* 13.0 11.6*  HCT 40.4 39.1 38.6* 37.1*  --  35.1*  MCV 97.6 94.9 95.5 96.4  --  95.4  PLT 299 317 303 277  --  937   Basic Metabolic Panel: Recent Labs  Lab 05/10/22 1153  05/11/22 0232 05/12/22 0409 05/13/22 0406 05/14/22 0824  NA 135 137 137 136 138  K 4.2 4.0 4.3 4.2 4.2  CL 103 104 104 105 109  CO2 '24 25 27 26 25  '$ GLUCOSE 93 105* 93 78 91  BUN '18 13 15 17 17  '$ CREATININE 1.17 0.87 0.90 0.85 0.83  CALCIUM 8.6* 8.5* 8.8* 8.2* 8.3*   GFR: Estimated Creatinine Clearance: 57.2 mL/min (by C-G formula based on SCr of 0.83 mg/dL). Liver Function Tests: Recent Labs  Lab 05/10/22 1153 05/11/22 0232 05/12/22 0409  AST 152* 92* 47*  ALT 81* 78* 54*  ALKPHOS 253* 232* 195*  BILITOT 1.1 0.9 0.7  PROT 7.7 7.6 7.2  ALBUMIN 3.7 3.5 3.3*   Recent Labs  Lab 05/10/22 1153  LIPASE 26   No results for input(s): "AMMONIA" in the last 168 hours. Coagulation Profile: No results for input(s): "INR", "PROTIME" in the last 168 hours. Cardiac Enzymes: No results for input(s): "CKTOTAL", "CKMB", "CKMBINDEX", "TROPONINI" in the last 168 hours. BNP (last 3 results) No results for input(s): "PROBNP" in the last 8760 hours. HbA1C: No results for input(s): "HGBA1C" in the last 72 hours. CBG: No results for input(s): "GLUCAP" in the last 168 hours. Lipid Profile: No results for input(s): "CHOL", "HDL", "LDLCALC", "TRIG", "CHOLHDL", "  LDLDIRECT" in the last 72 hours. Thyroid Function Tests: No results for input(s): "TSH", "T4TOTAL", "FREET4", "T3FREE", "THYROIDAB" in the last 72 hours. Anemia Panel: No results for input(s): "VITAMINB12", "FOLATE", "FERRITIN", "TIBC", "IRON", "RETICCTPCT" in the last 72 hours. Sepsis Labs: Recent Labs  Lab 05/10/22 1234 05/10/22 1851  LATICACIDVEN 0.9 1.5    Recent Results (from the past 240 hour(s))  Urine Culture     Status: None   Collection Time: 05/10/22 12:34 PM   Specimen: Urine, Clean Catch  Result Value Ref Range Status   Specimen Description   Final    URINE, CLEAN CATCH Performed at [facility], [address] [place], [place] [address]    Special Requests   Final    NONE Performed at [facility], [address] [place], [place] [address]    Culture   Final    NO GROWTH Performed at [facility], [place] [address], [place], [place] [address]    Report Status 05/11/2022 FINAL  Final  Gastrointestinal Panel by PCR , Stool     Status: None   Collection Time: 05/10/22 12:34 PM   Specimen: Stool  Result Value Ref Range Status   Campylobacter species NOT DETECTED NOT DETECTED Final   Plesimonas shigelloides NOT DETECTED NOT DETECTED Final   Salmonella species NOT DETECTED NOT DETECTED Final   Yersinia enterocolitica NOT DETECTED NOT DETECTED Final   Vibrio species NOT DETECTED NOT DETECTED Final   Vibrio cholerae NOT DETECTED NOT DETECTED Final   Enteroaggregative E coli (EAEC) NOT DETECTED NOT DETECTED Final   Enteropathogenic E coli (EPEC) NOT DETECTED NOT DETECTED Final   Enterotoxigenic E coli (ETEC) NOT DETECTED NOT DETECTED Final   Shiga like toxin producing E coli (STEC) NOT DETECTED NOT DETECTED Final   Shigella/Enteroinvasive E coli (EIEC) NOT DETECTED NOT DETECTED Final   Cryptosporidium NOT DETECTED NOT DETECTED Final   Cyclospora cayetanensis NOT DETECTED NOT DETECTED Final   Entamoeba histolytica NOT DETECTED NOT DETECTED Final   Giardia lamblia NOT DETECTED NOT DETECTED Final   Adenovirus F40/41 NOT DETECTED NOT DETECTED Final   Astrovirus NOT DETECTED NOT DETECTED Final   Norovirus GI/GII NOT DETECTED NOT DETECTED Final   Rotavirus A NOT DETECTED NOT DETECTED Final   Sapovirus (I, II, IV, and V) NOT DETECTED NOT DETECTED Final    Comment: Performed at [facility], [address] [place], [place] [address]  C Difficile Quick Screen w PCR reflex     Status: None   Collection Time: 05/10/22 12:34 PM   Specimen: STOOL  Result Value Ref Range Status   C Diff antigen NEGATIVE NEGATIVE Final   C Diff toxin NEGATIVE NEGATIVE Final   C Diff interpretation No C. difficile detected.  Final    Comment: Performed at [facility], [address] [place], [place] [address]  Culture, routine-genital     Status: None   Collection Time: 05/11/22  2:47 PM   Specimen: Urethra  Result Value Ref Range Status   Specimen Description   Final    URETHRA Performed at [facility], [address], [place], [place] [address]    Special Requests   Final    NONE Performed at [facility], [address] [place], [place] [address]    Culture   Final    FEW Multiple bacterial morphotypes present, none predominant. Suggest appropriate recollection if clinically indicated. NO STAPHYLOCOCCUS AUREUS ISOLATED NO GROUP A STREP (S.PYOGENES) ISOLATED NO  GROUP B STREP (S.AGALACTIAE) ISOLATED Performed at [facility], [place] [address], [place], [place] [address]    Report Status 05/14/2022 FINAL  Final         Radiology Studies: No results found.      Scheduled Meds:  amLODipine  5 mg Oral Daily   bicalutamide  50 mg Oral Daily   calcium-vitamin D  1 tablet Oral BID   cyanocobalamin  500 mcg Oral Daily   DULoxetine  20 mg Oral BID   fluconazole  200 mg Oral Daily   gabapentin  100 mg Oral QHS   galantamine  16 mg Oral Q breakfast   loratadine  10 mg Oral Daily   magnesium oxide  400 mg Oral Daily   memantine  10 mg Oral BID   multivitamin with minerals  1 tablet Oral Daily   tamsulosin  0.4 mg Oral QHS   traZODone  50 mg Oral QHS   Continuous Infusions:  sodium chloride 75 mL/hr at 05/14/22 0925   aztreonam 2 g (05/14/22 0548)   sodium chloride irrigation       LOS: 4 days      Sidney Ace, MD [facility]   If 7PM-7AM, please contact night-coverage  05/14/2022, 10:57 AM

## 2022-05-14 NOTE — Consult Note (Signed)
Pharmacy Antibiotic Note  Steven Elliott is a 88 y.o. male admitted on 05/10/2022 with  ongoing diarrhea and penile pain .  Patient was diagnosed with metastatic prostate cancer and has indwelling foley catheter (placed 05/10/2022). He was recently evaluated for balanitis and placed on cephalexin for treatment. Patient reported severe allergy and refused to be treated with Cefepime (tolerated Zosyn on 8/3). Aztreonam will be continued for this reason. Renal function is stable and at baseline (WNL). Pharmacy has been consulted for Aztreonam dosing.   Plan: Day 5 of antibiotics.  Continue Aztreonam 2g IV Q8H. Scheduled to end 8/27 for 7 days of treatment. Patient is also on fluconazole 200 mg PO daily. Scheduled to end 8/28 for 7 days of treatment. Monitor renal function while on aztreonam.  Height: '5\' 8"'$  (172.7 cm) IBW/kg (Calculated) : 68.4  Temp (24hrs), Avg:97.7 F (36.5 C), Min:97.3 F (36.3 C), Max:98 F (36.7 C)  Recent Labs  Lab 05/10/22 1153 05/10/22 1234 05/10/22 1851 05/11/22 0232 05/12/22 0409 05/13/22 0406  WBC 11.5*  --   --  11.8* 9.1 9.0  CREATININE 1.17  --   --  0.87 0.90 0.85  LATICACIDVEN  --  0.9 1.5  --   --   --      Estimated Creatinine Clearance: 55.9 mL/min (by C-G formula based on SCr of 0.85 mg/dL).    Allergies  Allergen Reactions   Procaine Anaphylaxis   Ciprofloxacin Other (See Comments) and Nausea And Vomiting    Malaise, Rigor   Hydrocodone     Other reaction(s): Rash, Hives, Heart palpitations ()   Hydrocodone-Acetaminophen Itching    Watery eyes   Lisinopril Other (See Comments)   Omeprazole Other (See Comments)    GI reaction   Penicillins Other (See Comments)   Rabeprazole Other (See Comments)    GI reaction    Antimicrobials this admission: 8/20 Aztreonam >>  8/21 Fluconazole >>   Dose adjustments this admission: N/A  Microbiology results: 8/20 UCx: NG final 8/20 GIPP: negative  8/20 Cdiff quick scan: negative 8/21  Urethral cx: negative for GAS, GBS, and Staph  Thank you for allowing pharmacy to be a part of this patient's care.  Gretel Acre, PharmD PGY1 Pharmacy Resident 05/14/2022 8:34 AM

## 2022-05-14 NOTE — Plan of Care (Signed)

## 2022-05-14 NOTE — TOC Progression Note (Signed)
Transition of Care [facility]) - Progression Note    Patient Details  Name: Steven Elliott MRN: [id number] Date of Birth: 1930/07/17  Transition of Care [facility]) CM/SW [place], RN Phone Number: 05/14/2022, 3:07 PM  Clinical Narrative:    There is no clear DC Dispostion He has worsening hematuria  TOC to continue to follow and assist with DC Planning       Expected Discharge Plan and Services                                                 Social Determinants of Health (SDOH) Interventions    Readmission Risk Interventions     No data to display

## 2022-05-14 NOTE — Plan of Care (Signed)
  Problem: Education: Goal: Knowledge of General Education information will improve Description: Including pain rating scale, medication(s)/side effects and non-pharmacologic comfort measures Outcome: Progressing   Problem: Clinical Measurements: Goal: Cardiovascular complication will be avoided Outcome: Progressing   Problem: Nutrition: Goal: Adequate nutrition will be maintained Outcome: Progressing   Problem: Coping: Goal: Level of anxiety will decrease Outcome: Progressing   Problem: Elimination: Goal: Will not experience complications related to bowel motility Outcome: Progressing   Problem: Pain Managment: Goal: General experience of comfort will improve Outcome: Progressing

## 2022-05-15 DIAGNOSIS — R310 Gross hematuria: Secondary | ICD-10-CM | POA: Diagnosis not present

## 2022-05-15 DIAGNOSIS — N390 Urinary tract infection, site not specified: Secondary | ICD-10-CM

## 2022-05-15 DIAGNOSIS — C61 Malignant neoplasm of prostate: Secondary | ICD-10-CM | POA: Diagnosis not present

## 2022-05-15 DIAGNOSIS — T83098A Other mechanical complication of other indwelling urethral catheter, initial encounter: Secondary | ICD-10-CM

## 2022-05-15 LAB — BASIC METABOLIC PANEL
Anion gap: 4 — ABNORMAL LOW (ref 5–15)
BUN: 14 mg/dL (ref 8–23)
CO2: 26 mmol/L (ref 22–32)
Calcium: 8.1 mg/dL — ABNORMAL LOW (ref 8.9–10.3)
Chloride: 108 mmol/L (ref 98–111)
Creatinine, Ser: 0.69 mg/dL (ref 0.61–1.24)
GFR, Estimated: >60 mL/min (ref >60)
Glucose, Bld: 91 mg/dL (ref 70–99)
Potassium: 3.9 mmol/L (ref 3.5–5.1)
Sodium: 138 mmol/L (ref 135–145)

## 2022-05-15 LAB — CBC WITH DIFFERENTIAL/PLATELET
Abs Immature Granulocytes: 0.06 10*3/uL (ref 0.00–0.07)
Basophils Absolute: 0 10*3/uL (ref 0.0–0.1)
Basophils Relative: 1 %
Eosinophils Absolute: 0.2 10*3/uL (ref 0.0–0.5)
Eosinophils Relative: 4 %
HCT: 34.2 % — ABNORMAL LOW (ref 39.0–52.0)
Hemoglobin: 11.3 g/dL — ABNORMAL LOW (ref 13.0–17.0)
Immature Granulocytes: 1 %
Lymphocytes Relative: 22 %
Lymphs Abs: 1.5 10*3/uL (ref 0.7–4.0)
MCH: 31.7 pg (ref 26.0–34.0)
MCHC: 33 g/dL (ref 30.0–36.0)
MCV: 95.8 fL (ref 80.0–100.0)
Monocytes Absolute: 0.6 10*3/uL (ref 0.1–1.0)
Monocytes Relative: 9 %
Neutro Abs: 4.3 10*3/uL (ref 1.7–7.7)
Neutrophils Relative %: 63 %
Platelets: 257 10*3/uL (ref 150–400)
RBC: 3.57 MIL/uL — ABNORMAL LOW (ref 4.22–5.81)
RDW: 12.9 % (ref 11.5–15.5)
WBC: 6.6 10*3/uL (ref 4.0–10.5)
nRBC: 0 % (ref 0.0–0.2)

## 2022-05-15 NOTE — Progress Notes (Cosign Needed Addendum)
Urology Inpatient Progress Note  Subjective: No acute events overnight.  He is afebrile, VSS. Creatinine stable, 0.69.  Hemoglobin slightly down today, 11.3. Foley catheter in place draining red urine. He is accompanied today by his daughter at the bedside.  He denies any pain.  Daughter has noted some increased clots in the catheter tubing overnight.  They clarified that [facility] and this would have been his second outpatient voiding trial after developing urinary retention.  He failed the first.  His daughter reports that he is doing particularly well today and seems more like himself than he has in quite some time.  Anti-infectives: Anti-infectives (From admission, onward)    Start     Dose/Rate Route Frequency Ordered Stop   05/11/22 1500  fluconazole (DIFLUCAN) tablet 200 mg        200 mg Oral Daily 05/10/22 1734 05/18/22 0959   05/10/22 1800  aztreonam (AZACTAM) 2 g in sodium chloride 0.9 % 100 mL IVPB        2 g 200 mL/hr over 30 Minutes Intravenous Every 8 hours 05/10/22 1752 05/17/22 2159   05/10/22 1515  fluconazole (DIFLUCAN) tablet 200 mg        200 mg Oral  Once 05/10/22 1508 05/10/22 1558   05/10/22 1515  ceFEPIme (MAXIPIME) 2 g in sodium chloride 0.9 % 100 mL IVPB  Status:  Discontinued        2 g 200 mL/hr over 30 Minutes Intravenous  Once 05/10/22 1514 05/10/22 1606       Current Facility-Administered Medications  Medication Dose Route Frequency Provider Last Rate Last Admin   0.9 %  sodium chloride infusion   Intravenous Continuous Ralene Muskrat B, MD 75 mL/hr at 05/14/22 1758 New Bag at 05/14/22 1758   acetaminophen (TYLENOL) tablet 650 mg  650 mg Oral Q6H PRN Ralene Muskrat B, MD   650 mg at 05/13/22 1311   albuterol (PROVENTIL) (2.5 MG/3ML) 0.083% nebulizer solution 3 mL  3 mL Inhalation Q4H PRN Ivor Costa, MD       amLODipine (NORVASC) tablet 5 mg  5 mg Oral Daily Ivor Costa, MD   5 mg at 05/13/22 1035    aztreonam (AZACTAM) 2 g in sodium chloride 0.9 % 100 mL IVPB  2 g Intravenous Q8H Sreenath, Sudheer B, MD 200 mL/hr at 05/15/22 0531 2 g at 05/15/22 0531   bicalutamide (CASODEX) tablet 50 mg  50 mg Oral Daily Ivor Costa, MD   50 mg at 05/14/22 8938   calcium-vitamin D (OSCAL WITH D) 500-5 MG-MCG per tablet 1 tablet  1 tablet Oral BID Ivor Costa, MD   1 tablet at 05/14/22 2219   Chlorhexidine Gluconate Cloth 2 % PADS 6 each  6 each Topical Daily Sreenath, Sudheer B, MD       cyanocobalamin (VITAMIN B12) tablet 500 mcg  500 mcg Oral Daily Ivor Costa, MD   500 mcg at 05/14/22 0911   cyclobenzaprine (FLEXERIL) tablet 5 mg  5 mg Oral TID PRN Ivor Costa, MD   5 mg at 05/13/22 1940   dextromethorphan-guaiFENesin (MUCINEX DM) 30-600 MG per 12 hr tablet 1 tablet  1 tablet Oral BID PRN Ivor Costa, MD       DULoxetine (CYMBALTA) DR capsule 20 mg  20 mg Oral BID Ivor Costa, MD   20 mg at 05/14/22 2226   fluconazole (DIFLUCAN) tablet 200 mg  200 mg Oral Daily Sidney Ace, MD  200 mg at 05/14/22 0916   gabapentin (NEURONTIN) capsule 100 mg  100 mg Oral QHS Ivor Costa, MD   100 mg at 05/14/22 2218   galantamine (RAZADYNE ER) 24 hr capsule 16 mg  16 mg Oral Q breakfast Ivor Costa, MD   16 mg at 05/14/22 8341   hydrALAZINE (APRESOLINE) injection 5 mg  5 mg Intravenous Q2H PRN Ivor Costa, MD       loperamide (IMODIUM) capsule 2 mg  2 mg Oral BID PRN Ivor Costa, MD       loratadine (CLARITIN) tablet 10 mg  10 mg Oral Daily Ivor Costa, MD   10 mg at 05/14/22 9622   magnesium oxide (MAG-OX) tablet 400 mg  400 mg Oral Daily Ivor Costa, MD   400 mg at 05/14/22 0911   memantine (NAMENDA) tablet 10 mg  10 mg Oral BID Ivor Costa, MD   10 mg at 05/14/22 2218   morphine (PF) 2 MG/ML injection 2 mg  2 mg Intravenous Q3H PRN Ralene Muskrat B, MD       multivitamin with minerals tablet 1 tablet  1 tablet Oral Daily Ivor Costa, MD   1 tablet at 05/14/22 0911   ondansetron (ZOFRAN) injection 4 mg  4 mg Intravenous Q8H  PRN Ivor Costa, MD       oxyCODONE (Oxy IR/ROXICODONE) immediate release tablet 5 mg  5 mg Oral Q4H PRN Priscella Mann, Sudheer B, MD       sodium chloride irrigation 0.9 % 3,000 mL  3,000 mL Irrigation Continuous Judd Gaudier V, MD       tamsulosin (FLOMAX) capsule 0.4 mg  0.4 mg Oral QHS Ivor Costa, MD   0.4 mg at 05/14/22 2219   traZODone (DESYREL) tablet 50 mg  50 mg Oral Gordan Payment, MD   50 mg at 05/14/22 2219     Objective: Vital signs in last 24 hours: Temp:  [97.7 F (36.5 C)-98 F (36.7 C)] 98 F (36.7 C) (08/25 0427) Pulse Rate:  [84-101] 101 (08/25 0739) Resp:  [16-18] 18 (08/25 0427) BP: (121-142)/(55-74) 125/71 (08/25 0739) SpO2:  [91 %-97 %] 91 % (08/25 0739)  Intake/Output from previous day: 08/24 0701 - 08/25 0700 In: 480 [P.O.:480] Out: 3650 [Urine:3650] Intake/Output this shift: No intake/output data recorded.  Physical Exam Vitals and nursing note reviewed.  Constitutional:      General: He is not in acute distress.    Appearance: He is not ill-appearing, toxic-appearing or diaphoretic.  HENT:     Head: Normocephalic and atraumatic.  Pulmonary:     Effort: Pulmonary effort is normal. No respiratory distress.  Genitourinary:    Comments: Persistent penile edema Skin:    General: Skin is warm and dry.  Neurological:     Mental Status: He is alert and oriented to person, place, and time.  Psychiatric:        Mood and Affect: Mood normal.        Behavior: Behavior normal.    Lab Results:  Recent Labs    05/14/22 0824 05/14/22 1246 05/15/22 0314  WBC 9.7  --  6.6  HGB 11.6* 11.6* 11.3*  HCT 35.1*  --  34.2*  PLT 252  --  257   BMET Recent Labs    05/14/22 0824 05/15/22 0314  NA 138 138  K 4.2 3.9  CL 109 108  CO2 25 26  GLUCOSE 91 91  BUN 17 14  CREATININE 0.83 0.69  CALCIUM 8.3* 8.1*  Assessment & Plan: 88 year old male with metastatic prostate cancer followed by the [place] who recently developed urinary retention with Foley catheter  in place now admitted with diarrhea due to antibiotics for balanitis, persistent penile edema, and gross hematuria.  Hemoglobin has been declining slightly over the past several days.  His gross hematuria appears stable, but is not brisk enough to warrant CBI at this time.  I did very frank conversation with the patient and his daughter at the bedside today.  We discussed that his bleeding is likely related to his underlying BPH and prostate cancer, with catheter irritation contributing.  I explained that I am extremely hesitant to over manipulate his catheter or upsize it due to concerns that I may worsen his bleeding which would lead to CBI.  They expressed understanding and are in agreement with managing this conservatively.  I irrigated his Foley catheter with 240 cc of sterile water at the bedside this morning.  Efflux cleared from red to dark pink but I did not clear any clot material from the bladder.  Catheter irrigated easily and patient tolerated well.  We will plan to keep Foley catheter in place.  If he still admitted on Monday, lets proceed with a voiding trial.  They are in agreement with this plan.  I will place orders for this.  Otherwise, if he is discharged over the weekend they should follow-up with the [facility] for voiding trial.  Debroah Loop, PA-C 05/15/2022

## 2022-05-15 NOTE — Plan of Care (Signed)
  Problem: Education: Goal: Knowledge of General Education information will improve Description: Including pain rating scale, medication(s)/side effects and non-pharmacologic comfort measures Outcome: Progressing   Problem: Clinical Measurements: Goal: Will remain free from infection Outcome: Progressing   Problem: Clinical Measurements: Goal: Respiratory complications will improve Outcome: Progressing   Problem: Clinical Measurements: Goal: Cardiovascular complication will be avoided Outcome: Progressing   Problem: Activity: Goal: Risk for activity intolerance will decrease Outcome: Progressing   Problem: Nutrition: Goal: Adequate nutrition will be maintained Outcome: Progressing   Problem: Coping: Goal: Level of anxiety will decrease Outcome: Progressing   Problem: Pain Managment: Goal: General experience of comfort will improve Outcome: Progressing   Problem: Safety: Goal: Ability to remain free from injury will improve Outcome: Progressing   Problem: Skin Integrity: Goal: Risk for impaired skin integrity will decrease Outcome: Progressing

## 2022-05-15 NOTE — Progress Notes (Signed)
Mobility Specialist - Progress Note   05/15/22 1006  Mobility  Activity Ambulated with assistance in hallway  Level of Assistance Minimal assist, patient does 75% or more  Assistive Device Front wheel walker  Distance Ambulated (ft) 100 ft  Activity Response Tolerated well  $Mobility charge 1 Mobility   Pt sitting in recliner on RA upon arrival. Pt STS and ambulates in hallway MinA. Pt needs VC to correct anterior lean and for safety due to veering RW to the right. Pt returns to bed with needs in reach and bed alarm on.   Gretchen Short  Mobility Specialist  05/15/22 10:08 AM

## 2022-05-15 NOTE — Plan of Care (Signed)

## 2022-05-16 DIAGNOSIS — N481 Balanitis: Secondary | ICD-10-CM | POA: Diagnosis not present

## 2022-05-16 LAB — CBC WITH DIFFERENTIAL/PLATELET
Abs Immature Granulocytes: 0.08 10*3/uL — ABNORMAL HIGH (ref 0.00–0.07)
Basophils Absolute: 0 10*3/uL (ref 0.0–0.1)
Basophils Relative: 0 %
Eosinophils Absolute: 0.3 10*3/uL (ref 0.0–0.5)
Eosinophils Relative: 4 %
HCT: 32.3 % — ABNORMAL LOW (ref 39.0–52.0)
Hemoglobin: 10.5 g/dL — ABNORMAL LOW (ref 13.0–17.0)
Immature Granulocytes: 1 %
Lymphocytes Relative: 18 %
Lymphs Abs: 1.5 10*3/uL (ref 0.7–4.0)
MCH: 31.4 pg (ref 26.0–34.0)
MCHC: 32.5 g/dL (ref 30.0–36.0)
MCV: 96.7 fL (ref 80.0–100.0)
Monocytes Absolute: 0.6 10*3/uL (ref 0.1–1.0)
Monocytes Relative: 7 %
Neutro Abs: 5.9 10*3/uL (ref 1.7–7.7)
Neutrophils Relative %: 70 %
Platelets: 247 10*3/uL (ref 150–400)
RBC: 3.34 MIL/uL — ABNORMAL LOW (ref 4.22–5.81)
RDW: 13 % (ref 11.5–15.5)
WBC: 8.3 10*3/uL (ref 4.0–10.5)
nRBC: 0 % (ref 0.0–0.2)

## 2022-05-16 LAB — BASIC METABOLIC PANEL
Anion gap: 4 — ABNORMAL LOW (ref 5–15)
BUN: 12 mg/dL (ref 8–23)
CO2: 25 mmol/L (ref 22–32)
Calcium: 8 mg/dL — ABNORMAL LOW (ref 8.9–10.3)
Chloride: 110 mmol/L (ref 98–111)
Creatinine, Ser: 0.71 mg/dL (ref 0.61–1.24)
GFR, Estimated: >60 mL/min (ref >60)
Glucose, Bld: 90 mg/dL (ref 70–99)
Potassium: 3.8 mmol/L (ref 3.5–5.1)
Sodium: 139 mmol/L (ref 135–145)

## 2022-05-16 LAB — HEMOGLOBIN: Hemoglobin: 11.1 g/dL — ABNORMAL LOW (ref 13.0–17.0)

## 2022-05-16 NOTE — Plan of Care (Signed)
  Problem: Clinical Measurements: Goal: Cardiovascular complication will be avoided Outcome: Progressing   Problem: Nutrition: Goal: Adequate nutrition will be maintained Outcome: Progressing   Problem: Coping: Goal: Level of anxiety will decrease Outcome: Progressing   Problem: Elimination: Goal: Will not experience complications related to urinary retention Outcome: Not Applicable Note: Chronic foley   Problem: Pain Managment: Goal: General experience of comfort will improve Outcome: Progressing   Problem: Safety: Goal: Ability to remain free from injury will improve Outcome: Progressing   Problem: Skin Integrity: Goal: Risk for impaired skin integrity will decrease Outcome: Progressing

## 2022-05-16 NOTE — Progress Notes (Signed)
Irrigated foley catheter with a total of 47m sterile water.

## 2022-05-16 NOTE — Progress Notes (Signed)
PROGRESS NOTE    Steven Elliott  SWH:[id number] DOB: [birth year] DOA: 05/10/2022 PCP: [name]    Brief Narrative:  88 y.o. 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   Patient states that because of penile swelling, he was sent in ED on 8/14, and diagnosed with balanitis. He was started on Keflex on 8/14.  He developed diarrhea 3 days ago, which has been persistent.  He has more than 10 times of watery diarrhea each day.  No nausea, vomiting or abdominal pain.  No fever or chills.  Due to urinary retention, patient has Foley catheter placement, which is changed in ED today.  Patient does not have chest pain, cough, shortness of breath. Pt still has penile swelling, pain and erythema.  He has purulent discharge around the urethra.     Patient is [facility] patient.  ED physician tried to transfer patient to [facility], but they do not have beds. If we are notified that the patient has a bed, he will be transferred.  Started on Azactam and intravenous fluids  8/23: Patient had acute onset bright red blood in Foley bag associated with passage of clots.  Urology contacted by cross cover overnight.  Recommended initiation of CBI.  This morning that was not done however bedside RN informed that urology was at bedside early this morning, flush Foley catheter and did not recommend any further intervention.  Hemoglobin relatively stable.  8/24: Seen by urology this morning.  Foley catheter continues to drain.  Stable gross hematuria.  Downtrend in hemoglobin today.  Urology made aware.  Consider possible initiation of CBI versus upsizing of Foley catheter.  8/26: Slight downtrend in hemoglobin to 10.5   Assessment & Plan:   Principal Problem:   Balanitis Active Problems:   UTI (urinary tract infection)   Hypertension   BPH (benign  prostatic hyperplasia)   Diarrhea   Abnormal LFTs   COPD (chronic obstructive pulmonary disease) (HCC)   Chronic kidney disease, stage 3a (HCC)   Prostate cancer (HCC)   Dementia (HCC)   Urethritis  Acute balanitis, felt unlikely Urinary tract infection, resolved Patient received 4 to 5-day course of Azactam in house.  No signs of persistent infectious etiology Plan: Azactam stopped 8/25 DC IVF Continue Foley catheter Monitor vitals and fever curve Low threshold to restart antibiotics  Acute hematuria Noted on Foley bag and on 8/22 Suspect secondary to traumatic Foley insertion versus malignancy Worsening of hematuria associated with clots on 8/23 Urology consulted, flushed catheter at bedside Foley catheter continues to drain stable blood-tinged urine Hemoglobin drifting downward 10.5 as of 8/26 Plan: Continue to monitor Recheck p.m. hemoglobin Transfuse as needed Hb less than 8 Will keep in house Urology follow-up Monday 8/28 Defer CBI for now  Essential hypertension PTA amlodipine Hold hydrochlorothiazide As needed IV hydralazine  BPH Significant urinary retention, worsened by underlying balanitis PTA Flomax Continue Foley catheter Anticipate discharge with Foley catheter in place and outpatient follow-up with urology for voiding trial  Abnormal LFTs Unclear etiology Downtrending Possible mild ischemic hepatopathy versus medication effect  COPD (chronic obstructive pulmonary disease) ([place]) Stable PTA bronchodilators   Chronic kidney disease, stage 3a ([place]) Renal function close to baseline  Prostate cancer [facility]) Patient has metastasized prostate cancer. -Follow-up with urology/oncology in [place] home Casodex   Dementia ([place]) No behavior disturbance -Continue home Namenda, galantamine     DVT  prophylaxis: SCDs, chemoprophylaxis on hold until hematuria resolves Code Status: Full Family Communication: Daughter at bedside 8/22,  8/23, 8/26 Disposition Plan: Status is: Inpatient Remains inpatient appropriate because: Acute hematuria, acute on chronic anemia   Level of care: Med-Surg  Consultants:  None  Procedures:  Foley catheter insertion  Antimicrobials:   Subjective: Seen examined.  Feels well overall.  Resting comfortably in bed.  Eating breakfast.  Objective: Vitals:   05/15/22 0914 05/15/22 1521 05/15/22 2345 05/16/22 0803  BP: 125/71 130/68 133/74 116/60  Pulse: (!) 101 89 88 84  Resp: 18  18   Temp: 98 F (36.7 C) (!) 97.5 F (36.4 C) 98.2 F (36.8 C) 98.1 F (36.7 C)  TempSrc: Oral Oral    SpO2:  99% 96% 96%  Weight: 70.7 kg     Height: '5\' 8"'$  (1.727 m)       Intake/Output Summary (Last 24 hours) at 05/16/2022 1024 Last data filed at 05/16/2022 0347 Gross per 24 hour  Intake --  Output 1850 ml  Net -1850 ml   Filed Weights   05/15/22 0914  Weight: 70.7 kg    Examination:  General exam: NAD Respiratory system: Lungs clear.  Normal work of breathing.  Room air Cardiovascular system: S1-S2, RRR, no murmurs, no pedal edema Gastrointestinal system: Soft, NT/ND, normal bowel sounds GU: Foley in place, draining bloody urine.  Swelling of penile head noted Central nervous system: Alert, oriented x2, no focal deficits Extremities: Symmetric 5 x 5 power. Skin: No rashes, lesions or ulcers Psychiatry: Judgement and insight appear normal. Mood & affect appropriate.     Data Reviewed: I have personally reviewed following labs and imaging studies  CBC: Recent Labs  Lab 05/12/22 0409 05/13/22 0406 05/13/22 1359 05/14/22 0824 05/14/22 1246 05/15/22 0314 05/16/22 0741  WBC 9.1 9.0  --  9.7  --  6.6 8.3  NEUTROABS 6.8  --   --  7.4  --  4.3 5.9  HGB 12.6* 12.2* 13.0 11.6* 11.6* 11.3* 10.5*  HCT 38.6* 37.1*  --  35.1*  --  34.2* 32.3*  MCV 95.5 96.4  --  95.4  --  95.8 96.7  PLT 303 277  --  252  --  257 916   Basic Metabolic Panel: Recent Labs  Lab 05/12/22 0409  05/13/22 0406 05/14/22 0824 05/15/22 0314 05/16/22 0741  NA 137 136 138 138 139  K 4.3 4.2 4.2 3.9 3.8  CL 104 105 109 108 110  CO2 '27 26 25 26 25  '$ GLUCOSE 93 78 91 91 90  BUN '15 17 17 14 12  '$ CREATININE 0.90 0.85 0.83 0.69 0.71  CALCIUM 8.8* 8.2* 8.3* 8.1* 8.0*   GFR: Estimated Creatinine Clearance: 59.4 mL/min (by C-G formula based on SCr of 0.71 mg/dL). Liver Function Tests: Recent Labs  Lab 05/10/22 1153 05/11/22 0232 05/12/22 0409  AST 152* 92* 47*  ALT 81* 78* 54*  ALKPHOS 253* 232* 195*  BILITOT 1.1 0.9 0.7  PROT 7.7 7.6 7.2  ALBUMIN 3.7 3.5 3.3*   Recent Labs  Lab 05/10/22 1153  LIPASE 26   No results for input(s): "AMMONIA" in the last 168 hours. Coagulation Profile: No results for input(s): "INR", "PROTIME" in the last 168 hours. Cardiac Enzymes: No results for input(s): "CKTOTAL", "CKMB", "CKMBINDEX", "TROPONINI" in the last 168 hours. BNP (last 3 results) No results for input(s): "PROBNP" in the last 8760 hours. HbA1C: No results for input(s): "HGBA1C" in the last 72 hours. CBG: No results for  input(s): "GLUCAP" in the last 168 hours. Lipid Profile: No results for input(s): "CHOL", "HDL", "LDLCALC", "TRIG", "CHOLHDL", "LDLDIRECT" in the last 72 hours. Thyroid Function Tests: No results for input(s): "TSH", "T4TOTAL", "FREET4", "T3FREE", "THYROIDAB" in the last 72 hours. Anemia Panel: No results for input(s): "VITAMINB12", "FOLATE", "FERRITIN", "TIBC", "IRON", "RETICCTPCT" in the last 72 hours. Sepsis Labs: Recent Labs  Lab 05/10/22 1234 05/10/22 1851  LATICACIDVEN 0.9 1.5    Recent Results (from the past 240 hour(s))  Urine Culture     Status: None   Collection Time: 05/10/22 12:34 PM   Specimen: Urine, Clean Catch  Result Value Ref Range Status   Specimen Description   Final    URINE, CLEAN CATCH Performed at [facility], [address] [place], [place] [address]    Special Requests   Final    NONE Performed at [facility], [address], [place], [place] [address]    Culture   Final    NO GROWTH Performed at [facility], [place] [address] [place], [place] [address]    Report Status 05/11/2022 FINAL  Final  Gastrointestinal Panel by PCR , Stool     Status: None   Collection Time: 05/10/22 12:34 PM   Specimen: Stool  Result Value Ref Range Status   Campylobacter species NOT DETECTED NOT DETECTED Final   Plesimonas shigelloides NOT DETECTED NOT DETECTED Final   Salmonella species NOT DETECTED NOT DETECTED Final   Yersinia enterocolitica NOT DETECTED NOT DETECTED Final   Vibrio species NOT DETECTED NOT DETECTED Final   Vibrio cholerae NOT DETECTED NOT DETECTED Final   Enteroaggregative E coli (EAEC) NOT DETECTED NOT DETECTED Final   Enteropathogenic E coli (EPEC) NOT DETECTED NOT DETECTED Final   Enterotoxigenic E coli (ETEC) NOT DETECTED NOT DETECTED Final   Shiga like toxin producing E coli (STEC) NOT DETECTED NOT DETECTED Final   Shigella/Enteroinvasive E coli (EIEC) NOT DETECTED NOT DETECTED Final   Cryptosporidium NOT DETECTED NOT DETECTED Final   Cyclospora cayetanensis NOT DETECTED NOT DETECTED Final   Entamoeba histolytica NOT DETECTED NOT DETECTED Final   Giardia lamblia NOT DETECTED NOT DETECTED Final   Adenovirus F40/41 NOT DETECTED NOT DETECTED Final   Astrovirus NOT DETECTED NOT DETECTED Final   Norovirus GI/GII NOT DETECTED NOT DETECTED Final   Rotavirus A NOT DETECTED NOT DETECTED Final   Sapovirus (I, II, IV, and V) NOT DETECTED NOT DETECTED Final    Comment: Performed at [facility], [address] [place], [place] [address]  C Difficile Quick Screen w PCR reflex     Status: None   Collection Time: 05/10/22 12:34 PM   Specimen: STOOL  Result Value Ref Range Status   C Diff antigen NEGATIVE NEGATIVE Final   C Diff toxin NEGATIVE NEGATIVE Final   C Diff interpretation No C. difficile detected.  Final    Comment: Performed at [facility], [address] [place], [place] [address]  Culture, routine-genital     Status: None   Collection Time: 05/11/22  2:47 PM   Specimen: Urethra  Result Value Ref Range Status   Specimen Description   Final    URETHRA Performed at [facility], [address], [place], [place] [address]    Special Requests   Final    NONE Performed at [facility], [address] [place],  [address]    Culture   Final    FEW Multiple bacterial morphotypes present, none  predominant. Suggest appropriate recollection if clinically indicated. NO STAPHYLOCOCCUS AUREUS ISOLATED NO GROUP A STREP (S.PYOGENES) ISOLATED NO GROUP B STREP (S.AGALACTIAE) ISOLATED Performed at [facility], [place] [address] [place], [place] [address]    Report Status 05/14/2022 FINAL  Final         Radiology Studies: No results found.      Scheduled Meds:  amLODipine  5 mg Oral Daily   bicalutamide  50 mg Oral Daily   calcium-vitamin D  1 tablet Oral BID   Chlorhexidine Gluconate Cloth  6 each Topical Daily   cyanocobalamin  500 mcg Oral Daily   DULoxetine  20 mg Oral BID   gabapentin  100 mg Oral QHS   galantamine  16 mg Oral Q breakfast   loratadine  10 mg Oral Daily   magnesium oxide  400 mg Oral Daily   memantine  10 mg Oral BID   multivitamin with minerals  1 tablet Oral Daily   tamsulosin  0.4 mg Oral QHS   traZODone  50 mg Oral QHS   Continuous Infusions:  sodium chloride 75 mL/hr at 05/15/22 2349   sodium chloride irrigation       LOS: 6 days      Sidney Ace, MD [facility]   If 7PM-7AM, please contact night-coverage  05/16/2022, 10:24 AM

## 2022-05-16 NOTE — Plan of Care (Signed)

## 2022-05-17 DIAGNOSIS — N481 Balanitis: Secondary | ICD-10-CM | POA: Diagnosis not present

## 2022-05-17 LAB — CBC WITH DIFFERENTIAL/PLATELET
Abs Immature Granulocytes: 0.07 10*3/uL (ref 0.00–0.07)
Basophils Absolute: 0 10*3/uL (ref 0.0–0.1)
Basophils Relative: 0 %
Eosinophils Absolute: 0.2 10*3/uL (ref 0.0–0.5)
Eosinophils Relative: 3 %
HCT: 32.2 % — ABNORMAL LOW (ref 39.0–52.0)
Hemoglobin: 10.8 g/dL — ABNORMAL LOW (ref 13.0–17.0)
Immature Granulocytes: 1 %
Lymphocytes Relative: 19 %
Lymphs Abs: 1.4 10*3/uL (ref 0.7–4.0)
MCH: 31.9 pg (ref 26.0–34.0)
MCHC: 33.5 g/dL (ref 30.0–36.0)
MCV: 95 fL (ref 80.0–100.0)
Monocytes Absolute: 0.6 10*3/uL (ref 0.1–1.0)
Monocytes Relative: 8 %
Neutro Abs: 4.8 10*3/uL (ref 1.7–7.7)
Neutrophils Relative %: 69 %
Platelets: 252 10*3/uL (ref 150–400)
RBC: 3.39 MIL/uL — ABNORMAL LOW (ref 4.22–5.81)
RDW: 12.7 % (ref 11.5–15.5)
WBC: 7 10*3/uL (ref 4.0–10.5)
nRBC: 0 % (ref 0.0–0.2)

## 2022-05-17 LAB — PSA: Prostatic Specific Antigen: 29.81 ng/mL — ABNORMAL HIGH (ref 0.00–4.00)

## 2022-05-17 NOTE — Plan of Care (Signed)
  Problem: Clinical Measurements: Goal: Cardiovascular complication will be avoided Outcome: Progressing   Problem: Nutrition: Goal: Adequate nutrition will be maintained Outcome: Progressing   Problem: Coping: Goal: Level of anxiety will decrease Outcome: Progressing   Problem: Pain Managment: Goal: General experience of comfort will improve Outcome: Progressing   Problem: Safety: Goal: Ability to remain free from injury will improve Outcome: Progressing

## 2022-05-17 NOTE — Plan of Care (Signed)

## 2022-05-17 NOTE — Progress Notes (Signed)
PROGRESS NOTE    SAVON BORDONARO  AOZ:[id number] DOB: 09/26/1929 DOA: 05/10/2022 PCP: [name]    Brief Narrative:  88 y.o. 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   Patient states that because of penile swelling, he was sent in ED on 8/14, and diagnosed with balanitis. He was started on Keflex on 8/14.  He developed diarrhea 3 days ago, which has been persistent.  He has more than 10 times of watery diarrhea each day.  No nausea, vomiting or abdominal pain.  No fever or chills.  Due to urinary retention, patient has Foley catheter placement, which is changed in ED today.  Patient does not have chest pain, cough, shortness of breath. Pt still has penile swelling, pain and erythema.  He has purulent discharge around the urethra.     Patient is [facility] patient.  ED physician tried to transfer patient to [facility], but they do not have beds. If we are notified that the patient has a bed, he will be transferred.  Started on Azactam and intravenous fluids  8/23: Patient had acute onset bright red blood in Foley bag associated with passage of clots.  Urology contacted by cross cover overnight.  Recommended initiation of CBI.  This morning that was not done however bedside RN informed that urology was at bedside early this morning, flush Foley catheter and did not recommend any further intervention.  Hemoglobin relatively stable.  8/24: Seen by urology this morning.  Foley catheter continues to drain.  Stable gross hematuria.  Downtrend in hemoglobin today.  Urology made aware.  Consider possible initiation of CBI versus upsizing of Foley catheter.  8/26: Slight downtrend in hemoglobin to 10.5 8/27: Hemoglobin 10.8.  Hematuria clearing up   Assessment & Plan:   Principal Problem:   Balanitis Active Problems:   UTI (urinary  tract infection)   Hypertension   BPH (benign prostatic hyperplasia)   Diarrhea   Abnormal LFTs   COPD (chronic obstructive pulmonary disease) (HCC)   Chronic kidney disease, stage 3a (HCC)   Prostate cancer (HCC)   Dementia (HCC)   Urethritis  Acute balanitis, felt unlikely Urinary tract infection, resolved Patient received 4 to 5-day course of Azactam in house.  No signs of persistent infectious etiology Physical exam is improving Plan: Continue Foley catheter No further antibiotics No intravenous fluids Monitor vitals and fever curve  Acute hematuria Noted on Foley bag and on 8/22 Suspect secondary to traumatic Foley insertion versus malignancy Worsening of hematuria associated with clots on 8/23 Urology consulted, flushed catheter at bedside Foley catheter continues to drain stable blood-tinged urine Hemoglobin drifting downward 10.5 as of 8/26, improved to 10.8 on 8/27 Hematuria appears to be resolving Plan: Continue to monitor Continue Foley catheter Recheck p.m. hemoglobin and a.m. CBC Transfuse as needed Hb less than 8 Urology follow-up Monday 8/28 Defer CBI for now  Prostate cancer [facility]) Patient has metastasized prostate cancer. -Follow-up with urology/oncology in [place] [facility] -Case discussed with oncology Plan: Order CEA and PSA Oncology, Dr. Rogue Bussing to see patient in a.m.   Recommendations greatly appreciated  Essential hypertension PTA amlodipine Hold hydrochlorothiazide As needed IV hydralazine  BPH Significant urinary retention, worsened by underlying balanitis PTA Flomax Continue Foley catheter Anticipate discharge with Foley catheter in place and outpatient follow-up with urology for voiding trial  Abnormal LFTs Unclear etiology Downtrending Possible mild ischemic hepatopathy versus  medication effect  COPD (chronic obstructive pulmonary disease) (HCC) Stable PTA bronchodilators   Chronic kidney disease, stage  3a ([place]) Renal function close to baseline     Dementia ([place]) No behavior disturbance -Continue home Namenda, galantamine     DVT prophylaxis: SCDs, chemoprophylaxis on hold until hematuria resolves Code Status: Full Family Communication: Daughter at bedside 8/22, 8/23, 8/26, 8/27 Disposition Plan: Status is: Inpatient Remains inpatient appropriate because: Acute hematuria, acute on chronic anemia   Level of care: Med-Surg  Consultants:  None  Procedures:  Foley catheter insertion  Antimicrobials:   Subjective: Seen examined.  Feels well overall.  Resting comfortably in bed.  Eating breakfast.  Objective: Vitals:   05/16/22 1555 05/16/22 2107 05/17/22 0559 05/17/22 0800  BP: (!) 101/50 (!) 123/57 (!) 123/47 (!) 121/48  Pulse: 81 85 83 83  Resp:  '20 20 18  '$ Temp: 98.5 F (36.9 C) 98.9 F (37.2 C) 98.3 F (36.8 C) 98 F (36.7 C)  TempSrc:      SpO2: 93% 94% (!) 87% (!) 89%  Weight:      Height:        Intake/Output Summary (Last 24 hours) at 05/17/2022 1043 Last data filed at 05/17/2022 0603 Gross per 24 hour  Intake 120 ml  Output 2300 ml  Net -2180 ml   Filed Weights   05/15/22 0914  Weight: 70.7 kg    Examination:  General exam: No acute distress Respiratory system: Lungs clear.  Normal work of breathing.  Room air Cardiovascular system: S1-S2, RRR, no murmurs, no pedal edema Gastrointestinal system: Soft, NT/ND, normal bowel sounds GU: Foley in place, draining blood-tinged urine.  Swelling of penile head noted Central nervous system: Alert, oriented x2, no focal deficits Extremities: Symmetric 5 x 5 power. Skin: No rashes, lesions or ulcers Psychiatry: Judgement and insight appear normal. Mood & affect appropriate.     Data Reviewed: I have personally reviewed following labs and imaging studies  CBC: Recent Labs  Lab 05/12/22 0409 05/13/22 0406 05/13/22 1359 05/14/22 0824 05/14/22 1246 05/15/22 0314 05/16/22 0741 05/16/22 1349  05/17/22 0501  WBC 9.1 9.0  --  9.7  --  6.6 8.3  --  7.0  NEUTROABS 6.8  --   --  7.4  --  4.3 5.9  --  4.8  HGB 12.6* 12.2*   < > 11.6* 11.6* 11.3* 10.5* 11.1* 10.8*  HCT 38.6* 37.1*  --  35.1*  --  34.2* 32.3*  --  32.2*  MCV 95.5 96.4  --  95.4  --  95.8 96.7  --  95.0  PLT 303 277  --  252  --  257 247  --  252   < > = values in this interval not displayed.   Basic Metabolic Panel: Recent Labs  Lab 05/12/22 0409 05/13/22 0406 05/14/22 0824 05/15/22 0314 05/16/22 0741  NA 137 136 138 138 139  K 4.3 4.2 4.2 3.9 3.8  CL 104 105 109 108 110  CO2 '27 26 25 26 25  '$ GLUCOSE 93 78 91 91 90  BUN '15 17 17 14 12  '$ CREATININE 0.90 0.85 0.83 0.69 0.71  CALCIUM 8.8* 8.2* 8.3* 8.1* 8.0*   GFR: Estimated Creatinine Clearance: 59.4 mL/min (by C-G formula based on SCr of 0.71 mg/dL). Liver Function Tests: Recent Labs  Lab 05/10/22 1153 05/11/22 0232 05/12/22 0409  AST 152* 92* 47*  ALT 81* 78* 54*  ALKPHOS 253* 232* 195*  BILITOT 1.1 0.9 0.7  PROT 7.7  7.6 7.2  ALBUMIN 3.7 3.5 3.3*   Recent Labs  Lab 05/10/22 1153  LIPASE 26   No results for input(s): "AMMONIA" in the last 168 hours. Coagulation Profile: No results for input(s): "INR", "PROTIME" in the last 168 hours. Cardiac Enzymes: No results for input(s): "CKTOTAL", "CKMB", "CKMBINDEX", "TROPONINI" in the last 168 hours. BNP (last 3 results) No results for input(s): "PROBNP" in the last 8760 hours. HbA1C: No results for input(s): "HGBA1C" in the last 72 hours. CBG: No results for input(s): "GLUCAP" in the last 168 hours. Lipid Profile: No results for input(s): "CHOL", "HDL", "LDLCALC", "TRIG", "CHOLHDL", "LDLDIRECT" in the last 72 hours. Thyroid Function Tests: No results for input(s): "TSH", "T4TOTAL", "FREET4", "T3FREE", "THYROIDAB" in the last 72 hours. Anemia Panel: No results for input(s): "VITAMINB12", "FOLATE", "FERRITIN", "TIBC", "IRON", "RETICCTPCT" in the last 72 hours. Sepsis Labs: Recent Labs  Lab  05/10/22 1234 05/10/22 1851  LATICACIDVEN 0.9 1.5    Recent Results (from the past 240 hour(s))  Urine Culture     Status: None   Collection Time: 05/10/22 12:34 PM   Specimen: Urine, Clean Catch  Result Value Ref Range Status   Specimen Description   Final    URINE, CLEAN CATCH Performed at [facility], [address] [place], [place] [address]    Special Requests   Final    NONE Performed at [facility], [address], [place], [place] [address]    Culture   Final    NO GROWTH Performed at [facility], [place] [address] [place], [place] [address]    Report Status 05/11/2022 FINAL  Final  Gastrointestinal Panel by PCR , Stool     Status: None   Collection Time: 05/10/22 12:34 PM   Specimen: Stool  Result Value Ref Range Status   Campylobacter species NOT DETECTED NOT DETECTED Final   Plesimonas shigelloides NOT DETECTED NOT DETECTED Final   Salmonella species NOT DETECTED NOT DETECTED Final   Yersinia enterocolitica NOT DETECTED NOT DETECTED Final   Vibrio species NOT DETECTED NOT DETECTED Final   Vibrio cholerae NOT DETECTED NOT DETECTED Final   Enteroaggregative E coli (EAEC) NOT DETECTED NOT DETECTED Final   Enteropathogenic E coli (EPEC) NOT DETECTED NOT DETECTED Final   Enterotoxigenic E coli (ETEC) NOT DETECTED NOT DETECTED Final   Shiga like toxin producing E coli (STEC) NOT DETECTED NOT DETECTED Final   Shigella/Enteroinvasive E coli (EIEC) NOT DETECTED NOT DETECTED Final   Cryptosporidium NOT DETECTED NOT DETECTED Final   Cyclospora cayetanensis NOT DETECTED NOT DETECTED Final   Entamoeba histolytica NOT DETECTED NOT DETECTED Final   Giardia lamblia NOT DETECTED NOT DETECTED Final   Adenovirus F40/41 NOT DETECTED NOT DETECTED Final   Astrovirus NOT DETECTED NOT DETECTED Final   Norovirus GI/GII NOT DETECTED NOT DETECTED Final   Rotavirus A NOT DETECTED NOT DETECTED Final   Sapovirus (I, II, IV, and V) NOT DETECTED NOT DETECTED  Final    Comment: Performed at [facility], [address] [place], [place] [address]  C Difficile Quick Screen w PCR reflex     Status: None   Collection Time: 05/10/22 12:34 PM   Specimen: STOOL  Result Value Ref Range Status   C Diff antigen NEGATIVE NEGATIVE Final   C Diff toxin NEGATIVE NEGATIVE Final   C Diff interpretation No C. difficile detected.  Final    Comment: Performed at [facility], [address], [place], [place] [address]  Culture, routine-genital  Status: None   Collection Time: 05/11/22  2:47 PM   Specimen: Urethra  Result Value Ref Range Status   Specimen Description   Final    URETHRA Performed at [facility], [address], [place], [place] [address]    Special Requests   Final    NONE Performed at [facility], [address] [place],  [address]    Culture   Final    FEW Multiple bacterial morphotypes present, none predominant. Suggest appropriate recollection if clinically indicated. NO STAPHYLOCOCCUS AUREUS ISOLATED NO GROUP A STREP (S.PYOGENES) ISOLATED NO GROUP B STREP (S.AGALACTIAE) ISOLATED Performed at [facility], [place] [address] [place],  [address]    Report Status 05/14/2022 FINAL  Final         Radiology Studies: No results found.      Scheduled Meds:  amLODipine  5 mg Oral Daily   bicalutamide  50 mg Oral Daily   calcium-vitamin D  1 tablet Oral BID   Chlorhexidine Gluconate Cloth  6 each Topical Daily   cyanocobalamin  500 mcg Oral Daily   DULoxetine  20 mg Oral BID   gabapentin  100 mg Oral QHS   galantamine  16 mg Oral Q breakfast   loratadine  10 mg Oral Daily   magnesium oxide  400 mg Oral Daily   memantine  10 mg Oral BID   multivitamin with minerals  1 tablet Oral Daily   tamsulosin  0.4 mg Oral QHS   traZODone  50 mg Oral QHS   Continuous Infusions:  sodium chloride irrigation       LOS: 7 days      Sidney Ace, MD [facility]   If 7PM-7AM, please contact night-coverage  05/17/2022, 10:43 AM

## 2022-05-18 DIAGNOSIS — N481 Balanitis: Secondary | ICD-10-CM | POA: Diagnosis not present

## 2022-05-18 LAB — CBC WITH DIFFERENTIAL/PLATELET
Abs Immature Granulocytes: 0.06 10*3/uL (ref 0.00–0.07)
Basophils Absolute: 0 10*3/uL (ref 0.0–0.1)
Basophils Relative: 0 %
Eosinophils Absolute: 0.2 10*3/uL (ref 0.0–0.5)
Eosinophils Relative: 3 %
HCT: 34.7 % — ABNORMAL LOW (ref 39.0–52.0)
Hemoglobin: 11.5 g/dL — ABNORMAL LOW (ref 13.0–17.0)
Immature Granulocytes: 1 %
Lymphocytes Relative: 17 %
Lymphs Abs: 1.3 10*3/uL (ref 0.7–4.0)
MCH: 31.7 pg (ref 26.0–34.0)
MCHC: 33.1 g/dL (ref 30.0–36.0)
MCV: 95.6 fL (ref 80.0–100.0)
Monocytes Absolute: 0.7 10*3/uL (ref 0.1–1.0)
Monocytes Relative: 9 %
Neutro Abs: 5.4 10*3/uL (ref 1.7–7.7)
Neutrophils Relative %: 70 %
Platelets: 260 10*3/uL (ref 150–400)
RBC: 3.63 MIL/uL — ABNORMAL LOW (ref 4.22–5.81)
RDW: 12.9 % (ref 11.5–15.5)
WBC: 7.7 10*3/uL (ref 4.0–10.5)
nRBC: 0 % (ref 0.0–0.2)

## 2022-05-18 LAB — BASIC METABOLIC PANEL
Anion gap: 6 (ref 5–15)
BUN: 9 mg/dL (ref 8–23)
CO2: 27 mmol/L (ref 22–32)
Calcium: 8.9 mg/dL (ref 8.9–10.3)
Chloride: 106 mmol/L (ref 98–111)
Creatinine, Ser: 0.66 mg/dL (ref 0.61–1.24)
GFR, Estimated: >60 mL/min (ref >60)
Glucose, Bld: 96 mg/dL (ref 70–99)
Potassium: 3.7 mmol/L (ref 3.5–5.1)
Sodium: 139 mmol/L (ref 135–145)

## 2022-05-18 MED ORDER — LIDOCAINE HCL URETHRAL/MUCOSAL 2 % EX GEL
1.0000 | Freq: Once | CUTANEOUS | Status: DC
  Filled 2022-05-18 (×2): qty 5

## 2022-05-18 MED ORDER — DEGARELIX ACETATE(240 MG DOSE) 120 MG/VIAL ~~LOC~~ SOLR
240.0000 mg | Freq: Once | SUBCUTANEOUS | Status: DC
  Filled 2022-05-18 (×2): qty 6

## 2022-05-18 MED ORDER — OXYBUTYNIN CHLORIDE 5 MG PO TABS
5.0000 mg | ORAL_TABLET | Freq: Once | ORAL | Status: DC
  Filled 2022-05-18: qty 1

## 2022-05-18 NOTE — Consult Note (Signed)
[facility] CONSULT NOTE  Patient Care Team: [name] as PCP - General  CHIEF COMPLAINTS/PURPOSE OF CONSULTATION: Prostate cancer  Oncology History   No history exists.    HISTORY OF PRESENTING ILLNESS:  Steven Elliott 88 y.o.  male recently diagnosed prostate cancer-currently in the hospital for acute urinary retention/hematuria.  Patient a poor historian-as per the daughter patient has been diagnosed with metastatic prostate cancer at the [name] recently.  Initially prostate biopsy was considered.  However recent bone scan at the [facility] showed metastatic disease to the bone.  Patient has been started on bicalutamide.  Patient is currently awaiting to start ADT at the [name].  However patient is currently admitted to hospital for penile swelling/pain along with urine retention/hematuria.  Urology has been consulted for further evaluation recommendations.  Of note patient is CT scan that showed enlarged prostate pelvic adenopathy and also possible involvement of the bladder by the prostate.  Currently patient has a Foley catheter-no obvious blood noted in the urinary bag.  Review of Systems  Unable to perform ROS: Age  Dementia.  Dementia/ no family by th bedside.  MEDICAL HISTORY:  Past Medical History:  Diagnosis Date   Allergic rhinitis    Aneurysm of infrarenal abdominal aorta ([place]) 02/2015   5.4 cm by CT 03/20/16.    Anosmia    BPH (benign prostatic hyperplasia)    COPD (chronic obstructive pulmonary disease) (HCC)    Diverticulosis    Emphysema (subcutaneous) (surgical) resulting from a procedure    severe by CT 03/20/16   GERD (gastroesophageal reflux disease)    History of bilateral inguinal hernias 02/2015   Hypertension    Peripheral vascular disease ([place]) 02/2015   heavily calcified but patent bil iliac arteries by CTA 03/20/16.    SURGICAL HISTORY: Past Surgical History:  Procedure Laterality Date   APPENDECTOMY     CHOLECYSTECTOMY      COLONOSCOPY  ~ 2007   at [place] Left 2014    SOCIAL HISTORY: Social History   Socioeconomic History   Marital status: Widowed    Spouse name: Not on file   Number of children: Not on file   Years of education: Not on file   Highest education level: Not on file  Occupational History   Occupation: Retired  Tobacco Use   Smoking status: Former    Types: Cigarettes, Cigars   Smokeless tobacco: Never  Scientific laboratory technician Use: Never used  Substance and Sexual Activity   Alcohol use: Yes    Comment: occ   Drug use: No   Sexual activity: Not on file  Other Topics Concern   Not on file  Social History Narrative   Not on file   Social Determinants of Health   Financial Resource Strain: Not on file  Food Insecurity: Not on file  Transportation Needs: Not on file  Physical Activity: Not on file  Stress: Not on file  Social Connections: Not on file  Intimate Partner Violence: Not on file    FAMILY HISTORY: Family History  Problem Relation Age of Onset   Breast cancer Mother    Pancreatitis Mother    Breast cancer Sister     ALLERGIES:  is allergic to procaine, ciprofloxacin, hydrocodone, hydrocodone-acetaminophen, lisinopril, omeprazole, penicillins, and rabeprazole.  MEDICATIONS:  Current Facility-Administered Medications  Medication Dose Route Frequency Provider Last Rate Last Admin   acetaminophen (TYLENOL) tablet  650 mg  650 mg Oral Q6H PRN Ralene Muskrat B, MD   650 mg at 05/13/22 1311   albuterol (PROVENTIL) (2.5 MG/3ML) 0.083% nebulizer solution 3 mL  3 mL Inhalation Q4H PRN Ivor Costa, MD       amLODipine (NORVASC) tablet 5 mg  5 mg Oral Daily Ivor Costa, MD   5 mg at 05/18/22 4540   bicalutamide (CASODEX) tablet 50 mg  50 mg Oral Daily Ivor Costa, MD   50 mg at 05/18/22 9811   calcium-vitamin D (OSCAL WITH D) 500-5 MG-MCG per tablet 1 tablet  1 tablet Oral BID Ivor Costa, MD   1 tablet at 05/18/22 9147    Chlorhexidine Gluconate Cloth 2 % PADS 6 each  6 each Topical Daily Ralene Muskrat B, MD   6 each at 05/17/22 8295   cyanocobalamin (VITAMIN B12) tablet 500 mcg  500 mcg Oral Daily Ivor Costa, MD   500 mcg at 05/18/22 6213   cyclobenzaprine (FLEXERIL) tablet 5 mg  5 mg Oral TID PRN Ivor Costa, MD   5 mg at 05/15/22 0908   dextromethorphan-guaiFENesin (Rinard DM) 30-600 MG per 12 hr tablet 1 tablet  1 tablet Oral BID PRN Ivor Costa, MD       DULoxetine (CYMBALTA) DR capsule 20 mg  20 mg Oral BID Ivor Costa, MD   20 mg at 05/18/22 0910   gabapentin (NEURONTIN) capsule 100 mg  100 mg Oral QHS Ivor Costa, MD   100 mg at 05/17/22 2306   galantamine (RAZADYNE ER) 24 hr capsule 16 mg  16 mg Oral Q breakfast Ivor Costa, MD   16 mg at 05/18/22 0865   hydrALAZINE (APRESOLINE) injection 5 mg  5 mg Intravenous Q2H PRN Ivor Costa, MD       loperamide (IMODIUM) capsule 2 mg  2 mg Oral BID PRN Ivor Costa, MD       loratadine (CLARITIN) tablet 10 mg  10 mg Oral Daily Ivor Costa, MD   10 mg at 05/18/22 0908   magnesium oxide (MAG-OX) tablet 400 mg  400 mg Oral Daily Ivor Costa, MD   400 mg at 05/18/22 7846   memantine (NAMENDA) tablet 10 mg  10 mg Oral BID Ivor Costa, MD   10 mg at 05/18/22 9629   morphine (PF) 2 MG/ML injection 2 mg  2 mg Intravenous Q3H PRN Ralene Muskrat B, MD       multivitamin with minerals tablet 1 tablet  1 tablet Oral Daily Ivor Costa, MD   1 tablet at 05/18/22 0913   ondansetron (ZOFRAN) injection 4 mg  4 mg Intravenous Q8H PRN Ivor Costa, MD       oxyCODONE (Oxy IR/ROXICODONE) immediate release tablet 5 mg  5 mg Oral Q4H PRN Sreenath, Sudheer B, MD       sodium chloride irrigation 0.9 % 3,000 mL  3,000 mL Irrigation Continuous Judd Gaudier V, MD       tamsulosin (FLOMAX) capsule 0.4 mg  0.4 mg Oral QHS Ivor Costa, MD   0.4 mg at 05/17/22 2306   traZODone (DESYREL) tablet 50 mg  50 mg Oral QHS Ivor Costa, MD   50 mg at 05/17/22 2306    PHYSICAL EXAMINATION:   Vitals:    05/18/22 0741 05/18/22 1659  BP: 122/67 132/66  Pulse: 87 87  Resp: 16 16  Temp: 97.7 F (36.5 C) 97.6 F (36.4 C)  SpO2: 91% 95%   Filed Weights   05/15/22 0914  Weight: 155  lb 12.8 oz (70.7 kg)   Foley catheter in place.  Clear urine noted.  Oriented x1-2.  Physical Exam Vitals and nursing note reviewed.  HENT:     Head: Normocephalic and atraumatic.     Mouth/Throat:     Pharynx: Oropharynx is clear.  Eyes:     Extraocular Movements: Extraocular movements intact.     Pupils: Pupils are equal, round, and reactive to light.  Cardiovascular:     Rate and Rhythm: Normal rate and regular rhythm.  Pulmonary:     Comments: Decreased breath sounds bilaterally.  Abdominal:     Palpations: Abdomen is soft.  Musculoskeletal:        General: Normal range of motion.     Cervical back: Normal range of motion.  Skin:    General: Skin is warm.  Neurological:     General: No focal deficit present.     Mental Status: He is alert.     LABORATORY DATA:  I have reviewed the data as listed Lab Results  Component Value Date   WBC 7.7 05/18/2022   HGB 11.5 (L) 05/18/2022   HCT 34.7 (L) 05/18/2022   MCV 95.6 05/18/2022   PLT 260 05/18/2022   Recent Labs    05/10/22 1153 05/11/22 0232 05/12/22 0409 05/13/22 0406 05/15/22 0314 05/16/22 0741 05/18/22 0826  NA 135 137 137   < > 138 139 139  K 4.2 4.0 4.3   < > 3.9 3.8 3.7  CL 103 104 104   < > 108 110 106  CO2 '24 25 27   '$ < > '26 25 27  '$ GLUCOSE 93 105* 93   < > 91 90 96  BUN '18 13 15   '$ < > '14 12 9  '$ CREATININE 1.17 0.87 0.90   < > 0.69 0.71 0.66  CALCIUM 8.6* 8.5* 8.8*   < > 8.1* 8.0* 8.9  GFRNONAA 59* >60 >60   < > >60 >60 >60  PROT 7.7 7.6 7.2  --   --   --   --   ALBUMIN 3.7 3.5 3.3*  --   --   --   --   AST 152* 92* 47*  --   --   --   --   ALT 81* 78* 54*  --   --   --   --   ALKPHOS 253* 232* 195*  --   --   --   --   BILITOT 1.1 0.9 0.7  --   --   --   --   BILIDIR  --   --  0.1  --   --   --   --   IBILI  --    --  0.6  --   --   --   --    < > = values in this interval not displayed.    RADIOGRAPHIC STUDIES: I have personally reviewed the radiological images as listed and agreed with the findings in the report. CT ABDOMEN PELVIS W CONTRAST  Result Date: 05/10/2022 CLINICAL DATA:  Abdominal pain, diarrhea for 3 days, antibiotic treatment for penile infection around urinary catheter * Tracking Code: BO * EXAM: CT ABDOMEN AND PELVIS WITH CONTRAST TECHNIQUE: Multidetector CT imaging of the abdomen and pelvis was performed using the standard protocol following bolus administration of intravenous contrast. RADIATION DOSE REDUCTION: This exam was performed according to the departmental dose-optimization program which includes automated exposure control, adjustment of the mA and/or kV according to  patient size and/or use of iterative reconstruction technique. CONTRAST:  39m OMNIPAQUE IOHEXOL 300 MG/ML  SOLN COMPARISON:  04/23/2022 FINDINGS: Lower chest: No acute abnormality. Severe emphysema. Three-vessel coronary artery calcifications. Small hiatal hernia. Hepatobiliary: No focal liver abnormality is seen. Status post cholecystectomy. Postop biliary dilatation. Pancreas: Unremarkable. No pancreatic ductal dilatation or surrounding inflammatory changes. Spleen: Normal in size without significant abnormality. Adrenals/Urinary Tract: Adrenal glands are unremarkable. Mild right hydronephrosis and hydroureter, similar to prior examination. No left-sided hydronephrosis. Severely thickened urinary bladder wall. Foley catheter in the bladder. Stomach/Bowel: Stomach is within normal limits. Diverticulum of the descending duodenum. Appendix is not clearly visualized. No evidence of bowel wall thickening, distention, or inflammatory changes. Interval resolution of previously seen pneumatosis of the proximal to mid small bowel. Descending and sigmoid diverticulosis. Colon is fluid-filled to the rectum. Vascular/Lymphatic: Aortic  atherosclerosis. Infrarenal abdominal aortic aneurysm status post aortobiiliac stent endograft repair, unchanged. Enlarged, necrotic appearing perirectal right pelvic sidewall, and right iliac lymph nodes, largest right iliac node measuring 2.8 x 2.6 cm (series 3, image 64). Reproductive: Severe prostatomegaly. Extensive, nodular tissue about the prostate, adjacent bladder base, and rectum (series 3, image 69). Other: Large, fat containing bilateral inguinal hernias, partially imaged. No ascites. Musculoskeletal: No acute or significant osseous findings. IMPRESSION: 1. Severe prostatomegaly with extensive, nodular tissue about the prostate, adjacent bladder base, and rectum. 2. Enlarged, necrotic appearing perirectal, right pelvic sidewall, and right iliac lymph nodes. 3. Findings are most consistent with locally advanced prostate or rectal malignancy with direct involvement of adjacent organs and nodal metastatic disease. 4. Severely thickened urinary bladder wall with Foley catheter. 5. Mild right hydronephrosis and hydroureter, similar to prior examination, presumably secondary to obstruction of the distal right ureter by pelvic malignancy. 6. Interval resolution of previously seen pneumatosis of the proximal to mid small bowel. 7. Fluid-filled colon, in keeping with diarrheal illness. 8. Severe emphysema. 9. Coronary artery disease. Aortic Atherosclerosis (ICD10-I70.0) and Emphysema (ICD10-J43.9). Electronically Signed   By: ADelanna AhmadiM.D.   On: 05/10/2022 13:42   CT ABDOMEN PELVIS WO CONTRAST  Result Date: 04/23/2022 CLINICAL DATA:  Diarrhea and bloody stool for several months. EXAM: CT ABDOMEN AND PELVIS WITHOUT CONTRAST TECHNIQUE: Multidetector CT imaging of the abdomen and pelvis was performed following the standard protocol without IV contrast. RADIATION DOSE REDUCTION: This exam was performed according to the departmental dose-optimization program which includes automated exposure control,  adjustment of the mA and/or kV according to patient size and/or use of iterative reconstruction technique. COMPARISON:  March 20, 2016 FINDINGS: Lower chest: Emphysematous lung disease is seen within the bilateral lung bases. Hepatobiliary: An 11 mm cystic appearing focus is seen within the anterior aspect of the right lobe of the liver. Status post cholecystectomy. No biliary dilatation. Pancreas: Unremarkable. No pancreatic ductal dilatation or surrounding inflammatory changes. Spleen: Normal in size without focal abnormality. Adrenals/Urinary Tract: Adrenal glands are unremarkable. Kidneys are normal in size, without focal lesions. There is bilateral moderate to marked severity hydronephrosis and hydroureter, right greater than left. No obstructing renal calculi are identified. The urinary bladder is moderately distended, with asymmetric bladder wall thickening seen along the inferolateral aspect of the urinary bladder on the left (axial CT images 59 through 63). Stomach/Bowel: Stomach is within normal limits. The appendix is not visualized. No evidence of bowel dilatation. Numerous diverticula are seen within the descending and sigmoid colon. A large amount of peri-intestinal free air is seen, most prominent within the mid and upper abdomen. Vascular/Lymphatic: Stent graft  repair of an infrarenal abdominal aortic aneurysm is noted. A 3.6 cm x 2.9 cm lymph node is seen along the posterior right iliac chain (axial CT image 54, CT series 2). Reproductive: The prostate gland is markedly enlarged. Mass effect is seen along the base of the urinary bladder. The posterior border of the prostate gland is poorly defined, with heterogeneous soft tissue extending into the posterior aspect of the adjacent portion of the pelvis (axial CT images 56 through 65, CT series 2). The seminal vesicles are also enlarged and heterogeneous in appearance. Other: There is an 8.4 cm x 4.6 cm fat containing right inguinal hernia. A 6.4 cm x  5.0 cm left inguinal hernia is also noted. This contains fat and a small amount of fluid. A mild amount of inflammatory fat stranding is also seen. No abdominopelvic ascites Musculoskeletal: Multilevel degenerative changes are seen throughout the lumbar spine. IMPRESSION: 1. Large amount of peri-intestinal free air, most prominent within the mid and upper abdomen, consistent with bowel perforation. 2. Markedly enlarged prostate gland with additional findings concerning for prostate cancer. Correlation with PSA levels is recommended. 3. Asymmetric urinary bladder wall thickening, as described above, which is also concerning for an underlying neoplastic process. Further evaluation with urinalysis is recommended to exclude acute cystitis. 4. Colonic diverticulosis. 5. Bilateral fat-containing inguinal hernias, as described above. 6. Infrarenal abdominal aortic aneurysm stent graft placement. 7. Emphysematous lung disease. 8. Evidence of prior cholecystectomy. Emphysema (ICD10-J43.9). Electronically Signed   By: Virgina Norfolk M.D.   On: 04/23/2022 78:3     88 year old male patient with multiple comorbidities including mild -moderate dementia and recently diagnosed prostate cancer is currently in the hospital for acute retention  of urine/hematuria.  #[facility]ast[facility]with involvement of the ribs on bone scan.  PSA- 29.   #Acute retention of urine/hematuria-likely secondary to local invasion of the bladder by prostate malignancy.  #Recommendations/plan:  #Discussed with the daughter that we will recommend starting the patient on ADT in addition to bicalutamide.  Consider radiation oncology evaluation for palliative radiation for ongoing hematuria.   #With regards to acute urinary retention/hematuria will defer to urology.  Thank you Dr. Priscella Mann for allowing me to participate in the care of your pleasant patient. Please do not hesitate to contact me with  questions or concerns in the interim.  I discussed the above plan of care with the patient's daughter [name] at length.  She is in agreement.     Cammie Sickle, MD 05/18/2022 6:38 PM

## 2022-05-18 NOTE — Evaluation (Signed)
Physical Therapy Evaluation Patient Details Name: Steven Elliott MRN: [id number] DOB: 1929-10-11 Today's Date: 05/18/2022  History of Present Illness  Pt is a 88 y.o. male presenting to hospital 8/20 with c/o diarrhea and penile pain.  Recent treatment for balanitis.  Developed gross hematuria during hospitalization (8/22).  Pt admitted with balanitis, UTI, htn, diarrhea, and abnormal LFT's.  PMH includes prostate CA with urinary retention (with indwelling foley), known bone metastases, COPD, htn, diverticular bleeding, dementia, CKD.  Clinical Impression  Prior to hospital admission, pt was independent with functional mobility; pt's daughter lives with pt in 2 level home--bedroom 2nd floor (2 STE R railing and has chair lift to 2nd floor; is getting hospital bed from [name] which will probably go on 1st floor).  Currently pt is CGA with transfers and SBA to CGA ambulating in hallway with RW use (pt with step to gait pattern with slower but steady gait; mildly unsteady 1x when avoiding obstacle in hallway).  Pt reporting chronic B foot discomfort during ambulation (limiting distance able to walk) and usually wears shoes to walk to improve comfort (pt's daughter plans to bring pt's shoes in to improve comfort with ambulation).  Pt would benefit from skilled PT to address noted impairments and functional limitations (see below for any additional details).  Upon hospital discharge, pt would benefit from [place] and support from family.    Recommendations for follow up therapy are one component of a multi-disciplinary discharge planning process, led by the attending physician.  Recommendations may be updated based on patient status, additional functional criteria and insurance authorization.  Follow Up Recommendations Home health PT      Assistance Recommended at Discharge Frequent or constant Supervision/Assistance  Patient can return home with the following  A little help with walking and/or transfers;A  little help with bathing/dressing/bathroom;Assistance with cooking/housework;Assist for transportation;Help with stairs or ramp for entrance    Equipment Recommendations Rolling walker (2 wheels);BSC/3in1  Recommendations for Other Services       Functional Status Assessment Patient has had a recent decline in their functional status and demonstrates the ability to make significant improvements in function in a reasonable and predictable amount of time.     Precautions / Restrictions Precautions Precautions: Fall Restrictions Weight Bearing Restrictions: No      Mobility  Bed Mobility               General bed mobility comments: Deferred    Transfers Overall transfer level: Needs assistance Equipment used: Rolling walker (2 wheels) Transfers: Sit to/from Stand Sit to Stand: Min guard           General transfer comment: mild increased effort to stand up to RW and control descent sitting    Ambulation/Gait Ambulation/Gait assistance: Min guard Gait Distance (Feet): 100 Feet Assistive device: Rolling walker (2 wheels)   Gait velocity: decreased     General Gait Details: step to gait pattern; mostly SBA but CGA when pt avoiding something in hallway (mildly unsteady this 1x)  Stairs            Wheelchair Mobility    Modified Rankin (Stroke Patients Only)       Balance Overall balance assessment: Needs assistance Sitting-balance support: No upper extremity supported, Feet supported Sitting balance-Leahy Scale: Good Sitting balance - Comments: steady sitting reaching within BOS   Standing balance support: Bilateral upper extremity supported, During functional activity, Reliant on assistive device for balance Standing balance-Leahy Scale: Fair Standing balance comment: mildly unsteady 1x when  avoiding obstacle in hallway (otherwise steady with RW use)                             Pertinent Vitals/Pain Pain Assessment Pain Assessment:  No/denies pain    Home Living Family/patient expects to be discharged to:: Private residence Living Arrangements: Children (pt's daughter) Available Help at Discharge: Family;Available PRN/intermittently Type of Home: House Home Access: Stairs to enter Entrance Stairs-Rails: Right Entrance Stairs-Number of Steps: 2 Alternate Level Stairs-Number of Steps: has chair lift to 2nd floor Home Layout: Two level;Bed/bath upstairs Home Equipment: Rolling Walker (2 wheels);Grab bars - tub/shower;Shower seat - built in      Prior Function Prior Level of Function : Independent/Modified Independent                     Journalist, newspaper        Extremity/Trunk Assessment   Upper Extremity Assessment Upper Extremity Assessment: Generalized weakness    Lower Extremity Assessment Lower Extremity Assessment: Generalized weakness    Cervical / Trunk Assessment Cervical / Trunk Assessment: Other exceptions Cervical / Trunk Exceptions: forward head/shoulders  Communication   Communication: No difficulties  Cognition Arousal/Alertness: Awake/alert Behavior During Therapy: WFL for tasks assessed/performed Overall Cognitive Status: Within Functional Limits for tasks assessed                                          General Comments      Exercises     Assessment/Plan    PT Assessment Patient needs continued PT services  PT Problem List Decreased strength;Decreased activity tolerance;Decreased balance;Decreased mobility;Decreased knowledge of use of DME       PT Treatment Interventions DME instruction;Gait training;Stair training;Functional mobility training;Therapeutic activities;Therapeutic exercise;Balance training;Patient/family education    PT Goals (Current goals can be found in the Care Plan section)  Acute Rehab PT Goals Patient Stated Goal: to improve strength and mobility PT Goal Formulation: With patient/family Time For Goal Achievement:  06/01/22 Potential to Achieve Goals: Good    Frequency Min 2X/week     Co-evaluation               AM-PAC PT "6 Clicks" Mobility  Outcome Measure Help needed turning from your back to your side while in a flat bed without using bedrails?: None Help needed moving from lying on your back to sitting on the side of a flat bed without using bedrails?: None Help needed moving to and from a bed to a chair (including a wheelchair)?: A Little Help needed standing up from a chair using your arms (e.g., wheelchair or bedside chair)?: A Little Help needed to walk in hospital room?: A Little Help needed climbing 3-5 steps with a railing? : A Little 6 Click Score: 20    End of Session Equipment Utilized During Treatment: Gait belt Activity Tolerance: Patient tolerated treatment well Patient left:  (pt sitting on toilet with family present and then NT came to give pt a bath (pt back in recliner)) Nurse Communication: Mobility status;Precautions PT Visit Diagnosis: Unsteadiness on feet (R26.81);Muscle weakness (generalized) (M62.81)    Time: 6144-3154 PT Time Calculation (min) (ACUTE ONLY): 22 min   Charges:   PT Evaluation $PT Eval Low Complexity: 1 Low PT Treatments $Therapeutic Activity: 8-22 mins        Leitha Bleak, PT 05/18/22, 11:47 AM

## 2022-05-18 NOTE — Plan of Care (Signed)

## 2022-05-18 NOTE — Progress Notes (Signed)
Mobility Specialist - Progress Note   05/18/22 1001  Mobility  Activity Ambulated with assistance in hallway;Stood at bedside;Dangled on edge of bed  Level of Assistance Minimal assist, patient does 75% or more  Assistive Device Front wheel walker  Distance Ambulated (ft) 160 ft  Activity Response Tolerated well  $Mobility charge 1 Mobility   Pt supine in bed on RA upon arrival. Pt scoots to EOB and STS MinA. Pt ambulates in hallway SBA. Pt returns to recliner with needs in reach, chair alarm on, and family in room.   Gretchen Short  Mobility Specialist  05/18/22 10:03 AM

## 2022-05-18 NOTE — Progress Notes (Signed)
Met with the patient and his daughter in the room He lives at home with his daughter She provides transportation He has a rolling walker and a shower seat at home, [facility] [name] is sending a hospital bed and a 3 in1 to the home as well as a bed alarm and a cusion to avoid Pressure injury He is open with [facility] and they will add PT to this No additional needs at this time

## 2022-05-18 NOTE — Progress Notes (Signed)
       CROSS COVER NOTE  NAME: Steven Elliott MRN: [id number] DOB : 1930/06/02    Date of Service   05/18/2022   HPI/Events of Note   Message received from nursing reporting Bladder scan volume 424 mL. Steven Elliott's coude catheter was removed today and he has not voided.   At bedside daughter reports the catheter was removed around 10AM. Steven Elliott is experiencing significant discomfort despite PRN oxycodone and morphine administration. His bladder is visibly distended and palpable on assessment.   0011: Bladder scan 688 mL  Interventions   Plan: Insert coude - failed x3 Continue PRN morphine and oxycodone  2348 Spoke with Urologist Steven Elliott; recommended Oxybutynin and requested foley cart to bedside    This document was prepared using Dragon voice recognition software and may include unintentional dictation errors.  Neomia Glass DNP, MHA, FNP-BC Nurse Practitioner [facility] Pager [phone]

## 2022-05-18 NOTE — Plan of Care (Signed)
  Problem: Education: Goal: Knowledge of General Education information will improve Description: Including pain rating scale, medication(s)/side effects and non-pharmacologic comfort measures Outcome: Progressing   Problem: Health Behavior/Discharge Planning: Goal: Ability to manage health-related needs will improve Outcome: Progressing   Problem: Nutrition: Goal: Adequate nutrition will be maintained Outcome: Progressing   Problem: Coping: Goal: Level of anxiety will decrease Outcome: Progressing   Problem: Elimination: Goal: Will not experience complications related to bowel motility Outcome: Progressing   Problem: Pain Managment: Goal: General experience of comfort will improve Outcome: Progressing   Problem: Safety: Goal: Ability to remain free from injury will improve Outcome: Progressing   Problem: Skin Integrity: Goal: Risk for impaired skin integrity will decrease Outcome: Progressing

## 2022-05-18 NOTE — Progress Notes (Signed)
PROGRESS NOTE    Steven Elliott  NGE:[id number] DOB: 1930/01/18 DOA: 05/10/2022 PCP: [name]    Brief Narrative:  88 y.o. 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   Patient states that because of penile swelling, he was sent in ED on 8/14, and diagnosed with balanitis. He was started on Keflex on 8/14.  He developed diarrhea 3 days ago, which has been persistent.  He has more than 10 times of watery diarrhea each day.  No nausea, vomiting or abdominal pain.  No fever or chills.  Due to urinary retention, patient has Foley catheter placement, which is changed in ED today.  Patient does not have chest pain, cough, shortness of breath. Pt still has penile swelling, pain and erythema.  He has purulent discharge around the urethra.     Patient is [facility] patient.  ED physician tried to transfer patient to [facility], but they do not have beds. If we are notified that the patient has a bed, he will be transferred.  Started on Azactam and intravenous fluids  8/23: Patient had acute onset bright red blood in Foley bag associated with passage of clots.  Urology contacted by cross cover overnight.  Recommended initiation of CBI.  This morning that was not done however bedside RN informed that urology was at bedside early this morning, flush Foley catheter and did not recommend any further intervention.  Hemoglobin relatively stable.  8/24: Seen by urology this morning.  Foley catheter continues to drain.  Stable gross hematuria.  Downtrend in hemoglobin today.  Urology made aware.  Consider possible initiation of CBI versus upsizing of Foley catheter.  8/26: Slight downtrend in hemoglobin to 10.5 8/27: Hemoglobin 10.8.  Hematuria clearing up 8/28: Hemoglobin 9.5.  Hematuria continue to improve   Assessment & Plan:   Principal  Problem:   Balanitis Active Problems:   UTI (urinary tract infection)   Hypertension   BPH (benign prostatic hyperplasia)   Diarrhea   Abnormal LFTs   COPD (chronic obstructive pulmonary disease) (HCC)   Chronic kidney disease, stage 3a (HCC)   Prostate cancer (HCC)   Dementia (HCC)   Urethritis  Acute balanitis, felt unlikely Urinary tract infection, resolved Patient received 4 to 5-day course of Azactam in house.  No signs of persistent infectious etiology Physical exam is improving Plan: Continue Foley catheter No further antibiotics No intravenous fluids Monitor vitals and fever curve  Acute hematuria Noted on Foley bag and on 8/22 Suspect secondary to traumatic Foley insertion versus malignancy Worsening of hematuria associated with clots on 8/23 Urology consulted, flushed catheter at bedside Foley catheter continues to drain stable blood-tinged urine Hemoglobin drifting downward 10.5 as of 8/26, improved to 10.8 on 8/27 Hematuria appears to be resolving Hemoglobin 11.5 on 8/28 Plan: Continue to monitor Continue Foley catheter Possible voiding trial today, defer to urology Recheck p.m. hemoglobin and a.m. CBC Transfuse as needed Hb less than 8  Prostate cancer [facility]) Patient has metastasized prostate cancer. -Follow-up with urology/oncology in [place] home [facility] -Case discussed with oncology PSA elevated to 29 Plan: Case discussed with oncology Dr. Rogue Bussing.   Recommendations and consultation greatly appreciated  Essential hypertension PTA amlodipine Hold hydrochlorothiazide As needed IV hydralazine  BPH Significant urinary retention, worsened by underlying penile swelling PTA Flomax Continue Foley catheter.  Voiding trial today   Abnormal LFTs Unclear etiology Downtrending Possible mild ischemic  hepatopathy versus medication effect  COPD (chronic obstructive pulmonary disease) (HCC) Stable PTA bronchodilators   Chronic  kidney disease, stage 3a ([place]) Renal function close to baseline     Dementia ([place]) No behavior disturbance -Continue home Namenda, galantamine     DVT prophylaxis: SCDs, chemoprophylaxis on hold until hematuria resolves Code Status: Full Family Communication: Daughter at bedside 8/22, 8/23, 8/26, 8/27 Disposition Plan: Status is: Inpatient Remains inpatient appropriate because: Acute hematuria, acute on chronic anemia   Level of care: Med-Surg  Consultants:  None  Procedures:  Foley catheter insertion  Antimicrobials:   Subjective: Seen examined.  Feels well overall.  Resting comfortably in bed.  Eating breakfast.  Objective: Vitals:   05/17/22 1608 05/17/22 2006 05/18/22 0512 05/18/22 0741  BP: (!) 128/58 (!) 150/69 (!) 129/55 122/67  Pulse: 85 91 82 87  Resp: '18 18 17 16  '$ Temp: 97.7 F (36.5 C) 97.9 F (36.6 C) 98.2 F (36.8 C) 97.7 F (36.5 C)  TempSrc:      SpO2: 92% 96% 91% 91%  Weight:      Height:        Intake/Output Summary (Last 24 hours) at 05/18/2022 1008 Last data filed at 05/17/2022 2349 Gross per 24 hour  Intake --  Output 2226 ml  Net -2226 ml   Filed Weights   05/15/22 0914  Weight: 70.7 kg    Examination:  General exam: NAD Respiratory system: Lungs clear.  Normal work of breathing.  Room air Cardiovascular system: S1-S2, RRR, no murmurs, no pedal edema Gastrointestinal system: Soft, NT/ND, normal bowel sounds GU: Foley in place, draining dark-tinged urine.  Swelling of penile head noted Central nervous system: Alert, oriented x2, no focal deficits Extremities: Symmetric 5 x 5 power. Skin: No rashes, lesions or ulcers Psychiatry: Judgement and insight appear normal. Mood & affect appropriate.     Data Reviewed: I have personally reviewed following labs and imaging studies  CBC: Recent Labs  Lab 05/14/22 0824 05/14/22 1246 05/15/22 0314 05/16/22 0741 05/16/22 1349 05/17/22 0501 05/18/22 0826  WBC 9.7  --  6.6 8.3   --  7.0 7.7  NEUTROABS 7.4  --  4.3 5.9  --  4.8 5.4  HGB 11.6*   < > 11.3* 10.5* 11.1* 10.8* 11.5*  HCT 35.1*  --  34.2* 32.3*  --  32.2* 34.7*  MCV 95.4  --  95.8 96.7  --  95.0 95.6  PLT 252  --  257 247  --  252 260   < > = values in this interval not displayed.   Basic Metabolic Panel: Recent Labs  Lab 05/13/22 0406 05/14/22 0824 05/15/22 0314 05/16/22 0741 05/18/22 0826  NA 136 138 138 139 139  K 4.2 4.2 3.9 3.8 3.7  CL 105 109 108 110 106  CO2 '26 25 26 25 27  '$ GLUCOSE 78 91 91 90 96  BUN '17 17 14 12 9  '$ CREATININE 0.85 0.83 0.69 0.71 0.66  CALCIUM 8.2* 8.3* 8.1* 8.0* 8.9   GFR: Estimated Creatinine Clearance: 59.4 mL/min (by C-G formula based on SCr of 0.66 mg/dL). Liver Function Tests: Recent Labs  Lab 05/12/22 0409  AST 47*  ALT 54*  ALKPHOS 195*  BILITOT 0.7  PROT 7.2  ALBUMIN 3.3*   No results for input(s): "LIPASE", "AMYLASE" in the last 168 hours.  No results for input(s): "AMMONIA" in the last 168 hours. Coagulation Profile: No results for input(s): "INR", "PROTIME" in the last 168 hours. Cardiac Enzymes: No results  for input(s): "CKTOTAL", "CKMB", "CKMBINDEX", "TROPONINI" in the last 168 hours. BNP (last 3 results) No results for input(s): "PROBNP" in the last 8760 hours. HbA1C: No results for input(s): "HGBA1C" in the last 72 hours. CBG: No results for input(s): "GLUCAP" in the last 168 hours. Lipid Profile: No results for input(s): "CHOL", "HDL", "LDLCALC", "TRIG", "CHOLHDL", "LDLDIRECT" in the last 72 hours. Thyroid Function Tests: No results for input(s): "TSH", "T4TOTAL", "FREET4", "T3FREE", "THYROIDAB" in the last 72 hours. Anemia Panel: No results for input(s): "VITAMINB12", "FOLATE", "FERRITIN", "TIBC", "IRON", "RETICCTPCT" in the last 72 hours. Sepsis Labs: No results for input(s): "PROCALCITON", "LATICACIDVEN" in the last 168 hours.   Recent Results (from the past 240 hour(s))  Urine Culture     Status: None   Collection Time:  05/10/22 12:34 PM   Specimen: Urine, Clean Catch  Result Value Ref Range Status   Specimen Description   Final    URINE, CLEAN CATCH Performed at [facility], [address], [place], [place] [address]    Special Requests   Final    NONE Performed at [facility], [address], [place], [place] [address]    Culture   Final    NO GROWTH Performed at [facility], [place] [address], [place], [place] [address]    Report Status 05/11/2022 FINAL  Final  Gastrointestinal Panel by PCR , Stool     Status: None   Collection Time: 05/10/22 12:34 PM   Specimen: Stool  Result Value Ref Range Status   Campylobacter species NOT DETECTED NOT DETECTED Final   Plesimonas shigelloides NOT DETECTED NOT DETECTED Final   Salmonella species NOT DETECTED NOT DETECTED Final   Yersinia enterocolitica NOT DETECTED NOT DETECTED Final   Vibrio species NOT DETECTED NOT DETECTED Final   Vibrio cholerae NOT DETECTED NOT DETECTED Final   Enteroaggregative E coli (EAEC) NOT DETECTED NOT DETECTED Final   Enteropathogenic E coli (EPEC) NOT DETECTED NOT DETECTED Final   Enterotoxigenic E coli (ETEC) NOT DETECTED NOT DETECTED Final   Shiga like toxin producing E coli (STEC) NOT DETECTED NOT DETECTED Final   Shigella/Enteroinvasive E coli (EIEC) NOT DETECTED NOT DETECTED Final   Cryptosporidium NOT DETECTED NOT DETECTED Final   Cyclospora cayetanensis NOT DETECTED NOT DETECTED Final   Entamoeba histolytica NOT DETECTED NOT DETECTED Final   Giardia lamblia NOT DETECTED NOT DETECTED Final   Adenovirus F40/41 NOT DETECTED NOT DETECTED Final   Astrovirus NOT DETECTED NOT DETECTED Final   Norovirus GI/GII NOT DETECTED NOT DETECTED Final   Rotavirus A NOT DETECTED NOT DETECTED Final   Sapovirus (I, II, IV, and V) NOT DETECTED NOT DETECTED Final    Comment: Performed at [facility], [address] [place], [place] [address]  C Difficile Quick Screen w PCR reflex     Status: None    Collection Time: 05/10/22 12:34 PM   Specimen: STOOL  Result Value Ref Range Status   C Diff antigen NEGATIVE NEGATIVE Final   C Diff toxin NEGATIVE NEGATIVE Final   C Diff interpretation No C. difficile detected.  Final    Comment: Performed at [facility], [address] [place], [place] [address]  Culture, routine-genital     Status: None   Collection Time: 05/11/22  2:47 PM   Specimen: Urethra  Result Value Ref Range Status   Specimen Description   Final    URETHRA Performed at [facility]: [address] [address], [address], [place], [place] [address]  Special Requests   Final    NONE Performed at [facility], [address] [place], [place] [address]    Culture   Final    FEW Multiple bacterial morphotypes present, none predominant. Suggest appropriate recollection if clinically indicated. NO STAPHYLOCOCCUS AUREUS ISOLATED NO GROUP A STREP (S.PYOGENES) ISOLATED NO GROUP B STREP (S.AGALACTIAE) ISOLATED Performed at [facility], [place] [address], [place], [place] [address]    Report Status 05/14/2022 FINAL  Final         Radiology Studies: No results found.      Scheduled Meds:  amLODipine  5 mg Oral Daily   bicalutamide  50 mg Oral Daily   calcium-vitamin D  1 tablet Oral BID   Chlorhexidine Gluconate Cloth  6 each Topical Daily   cyanocobalamin  500 mcg Oral Daily   DULoxetine  20 mg Oral BID   gabapentin  100 mg Oral QHS   galantamine  16 mg Oral Q breakfast   loratadine  10 mg Oral Daily   magnesium oxide  400 mg Oral Daily   memantine  10 mg Oral BID   multivitamin with minerals  1 tablet Oral Daily   tamsulosin  0.4 mg Oral QHS   traZODone  50 mg Oral QHS   Continuous Infusions:  sodium chloride irrigation       LOS: 8 days      Sidney Ace, MD [facility]   If 7PM-7AM, please contact night-coverage  05/18/2022, 10:08 AM

## 2022-05-18 NOTE — Evaluation (Signed)
Occupational Therapy Evaluation Patient Details Name: Steven Elliott MRN: [id number] DOB: 25-Jan-1930 Today's Date: 05/18/2022   History of Present Illness Pt is a 88 y.o. male presenting to hospital 8/20 with c/o diarrhea and penile pain.  Recent treatment for balanitis.  Developed gross hematuria during hospitalization (8/22).  Pt admitted with balanitis, UTI, htn, diarrhea, and abnormal LFT's.  PMH includes prostate CA with urinary retention (with indwelling foley), known bone metastases, COPD, htn, diverticular bleeding, dementia, CKD.   Clinical Impression   Pt was seen for OT evaluation this date. Prior to hospital admission, pt was furniture cruising at home (dtr reports encouraging RW use) and generally independent with bathing and dressing. Dtr endorses pt has been less active since recent admission to [name] in early August. Pt presents to acute OT demonstrating impaired ADL performance and functional mobility 2/2 decreased strength, activity tolerance, and balance (See OT problem list). Pt currently requires SBA to CGA for LB dressing, ADL transfers. Pt/dtr educated in activity pacing to support improved activity tolerance and minimize weakness over time, educated in sock aid and reacher for LB dressing, educated in ECS for ADL to minimize SOB/over exertion. Pt/dtr verbalized understanding. Pt would benefit from skilled OT services to address noted impairments and functional limitations (see below for any additional details) in order to maximize safety and independence while minimizing falls risk and caregiver burden. Upon hospital discharge, recommend HHOT to maximize pt safety and return to functional independence during meaningful occupations of daily life.     Recommendations for follow up therapy are one component of a multi-disciplinary discharge planning process, led by the attending physician.  Recommendations may be updated based on patient status, additional functional criteria and  insurance authorization.   Follow Up Recommendations  Home health OT    Assistance Recommended at Discharge Intermittent Supervision/Assistance  Patient can return home with the following A little help with walking and/or transfers;A little help with bathing/dressing/bathroom;Assistance with cooking/housework;Assist for transportation;Help with stairs or ramp for entrance;Direct supervision/assist for medications management    Functional Status Assessment  Patient has had a recent decline in their functional status and demonstrates the ability to make significant improvements in function in a reasonable and predictable amount of time.  Equipment Recommendations  Other (comment) (sock aide)    Recommendations for Other Services       Precautions / Restrictions Precautions Precautions: Fall Restrictions Weight Bearing Restrictions: No      Mobility Bed Mobility Overal bed mobility: Needs Assistance Bed Mobility: Supine to Sit, Sit to Supine     Supine to sit: Supervision, HOB elevated Sit to supine: Supervision   General bed mobility comments: increased time/effort    Transfers Overall transfer level: Needs assistance Equipment used: Rolling walker (2 wheels) Transfers: Sit to/from Stand Sit to Stand: Min guard                  Balance   Sitting-balance support: No upper extremity supported, Feet supported Sitting balance-Leahy Scale: Fair Sitting balance - Comments: fair with post lean while doffing sock seated EOB, good otherwise   Standing balance support: Bilateral upper extremity supported, During functional activity, Reliant on assistive device for balance Standing balance-Leahy Scale: Fair                             ADL either performed or assessed with clinical judgement   ADL  General ADL Comments: Pt able to doff/don socks seated EOB with SBA for safety, CGA for ADL transfers.  Indep with self feeding. Demo's ability to manage urinal.     Vision         Perception     Praxis      Pertinent Vitals/Pain Pain Assessment Pain Assessment: No/denies pain     Hand Dominance     Extremity/Trunk Assessment Upper Extremity Assessment Upper Extremity Assessment: Generalized weakness   Lower Extremity Assessment Lower Extremity Assessment: Generalized weakness   Cervical / Trunk Assessment Cervical / Trunk Assessment: Other exceptions Cervical / Trunk Exceptions: forward head/shoulders   Communication Communication Communication: No difficulties   Cognition Arousal/Alertness: Awake/alert Behavior During Therapy: WFL for tasks assessed/performed Overall Cognitive Status: Within Functional Limits for tasks assessed                                 General Comments: HOH     General Comments       Exercises Other Exercises Other Exercises: Pt/dtr educated in activity pacing to support improved activity tolerance and minimize weakness over time, educated in sock aid and reacher for LB dressing, educated in ECS for ADL to minimize SOB/over exertion.   Shoulder Instructions      Home Living Family/patient expects to be discharged to:: Private residence Living Arrangements: Children (pt's daughter) Available Help at Discharge: Family;Available PRN/intermittently Type of Home: House Home Access: Stairs to enter [employer] of Steps: 2 Entrance Stairs-Rails: Right Home Layout: Two level;Bed/bath upstairs Alternate Level Stairs-Number of Steps: has chair lift to 2nd floor   Bathroom Shower/Tub: Tub/shower unit;Walk-in shower   Bathroom Toilet: Standard     Home Equipment: Conservation officer, nature (2 wheels);Grab bars - tub/shower;Shower seat - built in          Prior Functioning/Environment Prior Level of Function : Independent/Modified Independent;History of Falls (last six months)             Mobility Comments: Dtr  endorses pt should use RW but typically does not, instead furniture cruises ADLs Comments: Dtr manages medications and provides transportation. Up until recent admission to [name] in early August, pt was completing bathing and dressing tasks without assist.        OT Problem List: Decreased strength;Decreased activity tolerance;Impaired balance (sitting and/or standing);Decreased knowledge of use of DME or AE      OT Treatment/Interventions: Self-care/ADL training;Therapeutic exercise;Therapeutic activities;Energy conservation;DME and/or AE instruction;Patient/family education;Balance training    OT Goals(Current goals can be found in the care plan section) Acute Rehab OT Goals Patient Stated Goal: get better and go home OT Goal Formulation: With patient/family Time For Goal Achievement: 06/01/22 Potential to Achieve Goals: Good ADL Goals Pt Will Perform Lower Body Dressing: with modified independence;sit to/from stand (AE PRN) Additional ADL Goal #1: Pt will complete all aspects of toileting with mod indep Additional ADL Goal #2: Pt will complete all aspects of bathing, primarily from seated position with remote supervision.  OT Frequency: Min 2X/week    Co-evaluation              AM-PAC OT "6 Clicks" Daily Activity     Outcome Measure Help from another person eating meals?: None Help from another person taking care of personal grooming?: A Little Help from another person toileting, which includes using toliet, bedpan, or urinal?: A Little Help from another person bathing (including washing, rinsing, drying)?: A Little Help from another  person to put on and taking off regular upper body clothing?: None Help from another person to put on and taking off regular lower body clothing?: A Little 6 Click Score: 20   End of Session    Activity Tolerance: Patient tolerated treatment well Patient left: in bed;with call bell/phone within reach;with family/visitor present  OT Visit  Diagnosis: Other abnormalities of gait and mobility (R26.89);Muscle weakness (generalized) (M62.81);History of falling (Z91.81)                Time: 6606-3016 OT Time Calculation (min): 16 min Charges:  OT General Charges $OT Visit: 1 Visit OT Evaluation $OT Eval Moderate Complexity: 1 Mod  Ardeth Perfect., MPH, MS, OTR/L ascom [phone] 05/18/22, 2:10 PM

## 2022-05-19 DIAGNOSIS — R39198 Other difficulties with micturition: Secondary | ICD-10-CM

## 2022-05-19 DIAGNOSIS — N481 Balanitis: Secondary | ICD-10-CM | POA: Diagnosis not present

## 2022-05-19 LAB — CBC WITH DIFFERENTIAL/PLATELET
Abs Immature Granulocytes: 0.1 10*3/uL — ABNORMAL HIGH (ref 0.00–0.07)
Basophils Absolute: 0 10*3/uL (ref 0.0–0.1)
Basophils Relative: 0 %
Eosinophils Absolute: 0.1 10*3/uL (ref 0.0–0.5)
Eosinophils Relative: 0 %
HCT: 37.9 % — ABNORMAL LOW (ref 39.0–52.0)
Hemoglobin: 12.6 g/dL — ABNORMAL LOW (ref 13.0–17.0)
Immature Granulocytes: 1 %
Lymphocytes Relative: 7 %
Lymphs Abs: 1.2 10*3/uL (ref 0.7–4.0)
MCH: 32.1 pg (ref 26.0–34.0)
MCHC: 33.2 g/dL (ref 30.0–36.0)
MCV: 96.4 fL (ref 80.0–100.0)
Monocytes Absolute: 1.1 10*3/uL — ABNORMAL HIGH (ref 0.1–1.0)
Monocytes Relative: 7 %
Neutro Abs: 13.6 10*3/uL — ABNORMAL HIGH (ref 1.7–7.7)
Neutrophils Relative %: 85 %
Platelets: 269 10*3/uL (ref 150–400)
RBC: 3.93 MIL/uL — ABNORMAL LOW (ref 4.22–5.81)
RDW: 13 % (ref 11.5–15.5)
WBC: 16 10*3/uL — ABNORMAL HIGH (ref 4.0–10.5)
nRBC: 0 % (ref 0.0–0.2)

## 2022-05-19 LAB — BASIC METABOLIC PANEL
Anion gap: 5 (ref 5–15)
BUN: 14 mg/dL (ref 8–23)
CO2: 29 mmol/L (ref 22–32)
Calcium: 9.4 mg/dL (ref 8.9–10.3)
Chloride: 104 mmol/L (ref 98–111)
Creatinine, Ser: 0.9 mg/dL (ref 0.61–1.24)
GFR, Estimated: >60 mL/min (ref >60)
Glucose, Bld: 108 mg/dL — ABNORMAL HIGH (ref 70–99)
Potassium: 4.2 mmol/L (ref 3.5–5.1)
Sodium: 138 mmol/L (ref 135–145)

## 2022-05-19 LAB — CEA: CEA: 3.1 ng/mL (ref 0.0–4.7)

## 2022-05-19 MED ORDER — ALUM & MAG HYDROXIDE-SIMETH 200-200-20 MG/5ML PO SUSP: 30.0000 mL | ORAL | Status: DC | PRN

## 2022-05-19 MED ORDER — FAMOTIDINE 20 MG PO TABS
20.0000 mg | ORAL_TABLET | Freq: Two times a day (BID) | ORAL | Status: DC
  Administered 2022-05-19 – 2022-05-22 (×6): 20 mg via ORAL
  Filled 2022-05-19 (×6): qty 1

## 2022-05-19 MED ORDER — DEGARELIX ACETATE(240 MG DOSE) 120 MG/VIAL ~~LOC~~ SOLR
240.0000 mg | Freq: Once | SUBCUTANEOUS | Status: AC
  Administered 2022-05-19: 240 mg via SUBCUTANEOUS
  Filled 2022-05-19: qty 6

## 2022-05-19 NOTE — Progress Notes (Signed)
PROGRESS NOTE    Steven Elliott  CHE:[id number] DOB: 08-13-1930 DOA: 05/10/2022 PCP: [name]    Brief Narrative:  88 y.o. 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   Patient states that because of penile swelling, he was sent in ED on 8/14, and diagnosed with balanitis. He was started on Keflex on 8/14.  He developed diarrhea 3 days ago, which has been persistent.  He has more than 10 times of watery diarrhea each day.  No nausea, vomiting or abdominal pain.  No fever or chills.  Due to urinary retention, patient has Foley catheter placement, which is changed in ED today.  Patient does not have chest pain, cough, shortness of breath. Pt still has penile swelling, pain and erythema.  He has purulent discharge around the urethra.     Patient is [facility] patient.  ED physician tried to transfer patient to [facility], but they do not have beds. If we are notified that the patient has a bed, he will be transferred.  Started on Azactam and intravenous fluids  8/23: Patient had acute onset bright red blood in Foley bag associated with passage of clots.  Urology contacted by cross cover overnight.  Recommended initiation of CBI.  This morning that was not done however bedside RN informed that urology was at bedside early this morning, flush Foley catheter and did not recommend any further intervention.  Hemoglobin relatively stable.  8/24: Seen by urology this morning.  Foley catheter continues to drain.  Stable gross hematuria.  Downtrend in hemoglobin today.  Urology made aware.  Consider possible initiation of CBI versus upsizing of Foley catheter.  8/26: Slight downtrend in hemoglobin to 10.5 8/27: Hemoglobin 10.8.  Hematuria clearing up 8/28: Hemoglobin 9.5.  Hematuria continue to improve  8/29: Foley was discontinued yesterday.   Patient was able to void.  He was retaining 688 cc on the bladder yesterday night.  Urology called after multiple failed attempts by nursing staff to place coud catheter.  43 French coud placed by urology under sterile precautions at bedside.  Patient tolerated well with immediate relief.  Case discussed at length with oncology and urology.  Plan to likely start inpatient chemotherapy while patient admitted to [name]  Patient's 2 daughters aware and in agreement with the plan.   Assessment & Plan:   Principal Problem:   Balanitis Active Problems:   UTI (urinary tract infection)   Hypertension   BPH (benign prostatic hyperplasia)   Diarrhea   Abnormal LFTs   COPD (chronic obstructive pulmonary disease) (HCC)   Chronic kidney disease, stage 3a (HCC)   Prostate cancer (HCC)   Dementia (HCC)   Urethritis  Acute balanitis, felt unlikely Urinary tract infection, resolved Patient received 4 to 5-day course of Azactam in house.   No signs of persistent infectious etiology Physical exam is improving Plan: Continue Foley catheter, replaced 8/28 No further antibiotics No intravenous fluids Monitor vitals and fever curve  Acute hematuria Noted on Foley bag and on 8/22 Suspect secondary to traumatic Foley insertion versus malignancy Worsening of hematuria associated with clots on 8/23 Urology consulted, flushed catheter at bedside Foley catheter continues to drain stable blood-tinged urine Hemoglobin drifting downward 10.5 as of 8/26, improved to 10.8 on 8/27 Hematuria appears to be resolving Hemoglobin 11.5 on 8/28 Continues to improve after Foley replaced on 8/29 Plan: Continue to monitor Continue Foley catheter Would  not recommend voiding trial again Check a.m. CBC Keep hemoglobin greater than 8  Prostate cancer [facility]) Patient has metastasized prostate cancer. -Follow-up with urology/oncology in [place]: Oncology and radiation oncology consulted.  Urology  consulted.  Plan to initiate inpatient chemotherapy while patient here.  Defer specifics to oncology.  Appreciate assistance from Dr. Bernardo Heater and Dr. Rogue Bussing.  Essential hypertension PTA amlodipine Hold hydrochlorothiazide As needed IV hydralazine  BPH Significant urinary retention, worsened by underlying penile swelling PTA Flomax Continue Foley catheter.  Do not recommend repeat voiding trial   Abnormal LFTs Unclear etiology Downtrending Possible mild ischemic hepatopathy versus medication effect  COPD (chronic obstructive pulmonary disease) (HCC) Stable PTA bronchodilators   Chronic kidney disease, stage 3a ([place]) Renal function close to baseline     Dementia ([name]) No behavior disturbance -Continue home Namenda, galantamine     DVT prophylaxis: SCDs, chemoprophylaxis on hold until hematuria resolves Code Status: Full Family Communication: Daughter at bedside 8/22, 8/23, 8/26, 8/27, 8/29 Disposition Plan: Status is: Inpatient Remains inpatient appropriate because: Acute hematuria, acute on chronic anemia.  Metastatic prostate CA.  Plan to initiate inpatient chemotherapy   Level of care: Med-Surg  Consultants:  None  Procedures:  Foley catheter insertion  Antimicrobials:   Subjective: Seen examined.  Feels well overall.  Resting comfortably in bed.  Eating breakfast.  Objective: Vitals:   05/18/22 1659 05/18/22 2106 05/19/22 0450 05/19/22 0748  BP: 132/66 (!) 145/81 (!) 147/73 128/64  Pulse: 87 (!) 104 91 84  Resp: '16 20 20 16  '$ Temp: 97.6 F (36.4 C) 98.4 F (36.9 C) 98.7 F (37.1 C) 97.9 F (36.6 C)  TempSrc:      SpO2: 95% 98% 93% 97%  Weight:      Height:        Intake/Output Summary (Last 24 hours) at 05/19/2022 1233 Last data filed at 05/19/2022 0035 Gross per 24 hour  Intake 720 ml  Output 900 ml  Net -180 ml   Filed Weights   05/15/22 0914  Weight: 70.7 kg    Examination:  General exam: No acute distress Respiratory  system: Lungs clear.  Normal work of breathing.  Room air Cardiovascular system: S1-S2, RRR, no murmurs, no pedal edema Gastrointestinal system: Soft, NT/ND, normal bowel sounds GU: Foley in place, draining pale yellow urine.  Swelling of penile head noted Central nervous system: Alert, oriented x2, no focal deficits Extremities: Symmetric 5 x 5 power. Skin: No rashes, lesions or ulcers Psychiatry: Judgement and insight appear normal. Mood & affect appropriate.     Data Reviewed: I have personally reviewed following labs and imaging studies  CBC: Recent Labs  Lab 05/15/22 0314 05/16/22 0741 05/16/22 1349 05/17/22 0501 05/18/22 0826 05/19/22 0647  WBC 6.6 8.3  --  7.0 7.7 16.0*  NEUTROABS 4.3 5.9  --  4.8 5.4 13.6*  HGB 11.3* 10.5* 11.1* 10.8* 11.5* 12.6*  HCT 34.2* 32.3*  --  32.2* 34.7* 37.9*  MCV 95.8 96.7  --  95.0 95.6 96.4  PLT 257 247  --  252 260 412   Basic Metabolic Panel: Recent Labs  Lab 05/14/22 0824 05/15/22 0314 05/16/22 0741 05/18/22 0826 05/19/22 0647  NA 138 138 139 139 138  K 4.2 3.9 3.8 3.7 4.2  CL 109 108 110 106 104  CO2 '25 26 25 27 29  '$ GLUCOSE 91 91 90 96 108*  BUN '17 14 12 9 14  '$ CREATININE 0.83 0.69 0.71 0.66 0.90  CALCIUM 8.3* 8.1* 8.0* 8.9  9.4   GFR: Estimated Creatinine Clearance: 52.8 mL/min (by C-G formula based on SCr of 0.9 mg/dL). Liver Function Tests: No results for input(s): "AST", "ALT", "ALKPHOS", "BILITOT", "PROT", "ALBUMIN" in the last 168 hours.  No results for input(s): "LIPASE", "AMYLASE" in the last 168 hours.  No results for input(s): "AMMONIA" in the last 168 hours. Coagulation Profile: No results for input(s): "INR", "PROTIME" in the last 168 hours. Cardiac Enzymes: No results for input(s): "CKTOTAL", "CKMB", "CKMBINDEX", "TROPONINI" in the last 168 hours. BNP (last 3 results) No results for input(s): "PROBNP" in the last 8760 hours. HbA1C: No results for input(s): "HGBA1C" in the last 72 hours. CBG: No results  for input(s): "GLUCAP" in the last 168 hours. Lipid Profile: No results for input(s): "CHOL", "HDL", "LDLCALC", "TRIG", "CHOLHDL", "LDLDIRECT" in the last 72 hours. Thyroid Function Tests: No results for input(s): "TSH", "T4TOTAL", "FREET4", "T3FREE", "THYROIDAB" in the last 72 hours. Anemia Panel: No results for input(s): "VITAMINB12", "FOLATE", "FERRITIN", "TIBC", "IRON", "RETICCTPCT" in the last 72 hours. Sepsis Labs: No results for input(s): "PROCALCITON", "LATICACIDVEN" in the last 168 hours.   Recent Results (from the past 240 hour(s))  Urine Culture     Status: None   Collection Time: 05/10/22 12:34 PM   Specimen: Urine, Clean Catch  Result Value Ref Range Status   Specimen Description   Final    URINE, CLEAN CATCH Performed at [facility], [address] [place], [place] [address]    Special Requests   Final    NONE Performed at [facility], [address] [place], [place] [address]    Culture   Final    NO GROWTH Performed at [facility], [place] [address], [place],  [address]    Report Status 05/11/2022 FINAL  Final  Gastrointestinal Panel by PCR , Stool     Status: None   Collection Time: 05/10/22 12:34 PM   Specimen: Stool  Result Value Ref Range Status   Campylobacter species NOT DETECTED NOT DETECTED Final   Plesimonas shigelloides NOT DETECTED NOT DETECTED Final   Salmonella species NOT DETECTED NOT DETECTED Final   Yersinia enterocolitica NOT DETECTED NOT DETECTED Final   Vibrio species NOT DETECTED NOT DETECTED Final   Vibrio cholerae NOT DETECTED NOT DETECTED Final   Enteroaggregative E coli (EAEC) NOT DETECTED NOT DETECTED Final   Enteropathogenic E coli (EPEC) NOT DETECTED NOT DETECTED Final   Enterotoxigenic E coli (ETEC) NOT DETECTED NOT DETECTED Final   Shiga like toxin producing E coli (STEC) NOT DETECTED NOT DETECTED Final   Shigella/Enteroinvasive E coli (EIEC) NOT DETECTED NOT DETECTED Final   Cryptosporidium  NOT DETECTED NOT DETECTED Final   Cyclospora cayetanensis NOT DETECTED NOT DETECTED Final   Entamoeba histolytica NOT DETECTED NOT DETECTED Final   Giardia lamblia NOT DETECTED NOT DETECTED Final   Adenovirus F40/41 NOT DETECTED NOT DETECTED Final   Astrovirus NOT DETECTED NOT DETECTED Final   Norovirus GI/GII NOT DETECTED NOT DETECTED Final   Rotavirus A NOT DETECTED NOT DETECTED Final   Sapovirus (I, II, IV, and V) NOT DETECTED NOT DETECTED Final    Comment: Performed at [facility], [address] [place], [place] [address]  C Difficile Quick Screen w PCR reflex     Status: None   Collection Time: 05/10/22 12:34 PM   Specimen: STOOL  Result Value Ref Range Status   C Diff antigen NEGATIVE NEGATIVE Final   C Diff toxin NEGATIVE NEGATIVE Final   C  Diff interpretation No C. difficile detected.  Final    Comment: Performed at [facility], [address] [place], [place] [address]  Culture, routine-genital     Status: None   Collection Time: 05/11/22  2:47 PM   Specimen: Urethra  Result Value Ref Range Status   Specimen Description   Final    URETHRA Performed at [facility], [address], [place], [place] [address]    Special Requests   Final    NONE Performed at [facility], [address] [place], [place] [address]    Culture   Final    FEW Multiple bacterial morphotypes present, none predominant. Suggest appropriate recollection if clinically indicated. NO STAPHYLOCOCCUS AUREUS ISOLATED NO GROUP A STREP (S.PYOGENES) ISOLATED NO GROUP B STREP (S.AGALACTIAE) ISOLATED Performed at [facility], [place] [address] [place], [place] [address]    Report Status 05/14/2022 FINAL  Final         Radiology Studies: No results found.      Scheduled Meds:  amLODipine  5 mg Oral Daily   bicalutamide  50 mg Oral Daily   calcium-vitamin D  1 tablet Oral BID   Chlorhexidine Gluconate Cloth  6 each Topical Daily    cyanocobalamin  500 mcg Oral Daily   degarelix  240 mg Subcutaneous Once   DULoxetine  20 mg Oral BID   gabapentin  100 mg Oral QHS   galantamine  16 mg Oral Q breakfast   lidocaine  1 Application Urethral Once   loratadine  10 mg Oral Daily   magnesium oxide  400 mg Oral Daily   memantine  10 mg Oral BID   multivitamin with minerals  1 tablet Oral Daily   oxybutynin  5 mg Oral Once   tamsulosin  0.4 mg Oral QHS   traZODone  50 mg Oral QHS   Continuous Infusions:  sodium chloride irrigation       LOS: 9 days      Sidney Ace, MD [facility]   If 7PM-7AM, please contact night-coverage  05/19/2022, 12:33 PM

## 2022-05-19 NOTE — Plan of Care (Signed)
  Problem: Education: Goal: Knowledge of General Education information will improve Description: Including pain rating scale, medication(s)/side effects and non-pharmacologic comfort measures 05/19/2022 0521 by Parke Poisson, RN Outcome: Progressing 05/19/2022 0521 by Parke Poisson, RN Outcome: Progressing   Problem: Health Behavior/Discharge Planning: Goal: Ability to manage health-related needs will improve 05/19/2022 0521 by Parke Poisson, RN Outcome: Progressing 05/19/2022 0521 by Parke Poisson, RN Outcome: Progressing   Problem: Clinical Measurements: Goal: Ability to maintain clinical measurements within normal limits will improve 05/19/2022 0521 by Parke Poisson, RN Outcome: Progressing 05/19/2022 0521 by Parke Poisson, RN Outcome: Progressing Goal: Will remain free from infection 05/19/2022 0521 by Parke Poisson, RN Outcome: Progressing 05/19/2022 0521 by Parke Poisson, RN Outcome: Progressing Goal: Diagnostic test results will improve 05/19/2022 0521 by Parke Poisson, RN Outcome: Progressing 05/19/2022 0521 by Parke Poisson, RN Outcome: Progressing Goal: Respiratory complications will improve 05/19/2022 0521 by Parke Poisson, RN Outcome: Progressing 05/19/2022 0521 by Parke Poisson, RN Outcome: Progressing Goal: Cardiovascular complication will be avoided 05/19/2022 0521 by Parke Poisson, RN Outcome: Progressing 05/19/2022 0521 by Parke Poisson, RN Outcome: Progressing   Problem: Activity: Goal: Risk for activity intolerance will decrease 05/19/2022 0521 by Parke Poisson, RN Outcome: Progressing 05/19/2022 0521 by Parke Poisson, RN Outcome: Progressing   Problem: Nutrition: Goal: Adequate nutrition will be maintained 05/19/2022 0521 by Parke Poisson, RN Outcome: Progressing 05/19/2022 0521 by Parke Poisson, RN Outcome: Progressing   Problem: Coping: Goal: Level of anxiety will decrease 05/19/2022 0521 by Parke Poisson, RN Outcome:  Progressing 05/19/2022 0521 by Parke Poisson, RN Outcome: Progressing   Problem: Elimination: Goal: Will not experience complications related to bowel motility 05/19/2022 0521 by Parke Poisson, RN Outcome: Progressing 05/19/2022 0521 by Parke Poisson, RN Outcome: Progressing   Problem: Pain Managment: Goal: General experience of comfort will improve 05/19/2022 0521 by Parke Poisson, RN Outcome: Progressing 05/19/2022 0521 by Parke Poisson, RN Outcome: Progressing   Problem: Safety: Goal: Ability to remain free from injury will improve 05/19/2022 0521 by Parke Poisson, RN Outcome: Progressing 05/19/2022 0521 by Parke Poisson, RN Outcome: Progressing   Problem: Skin Integrity: Goal: Risk for impaired skin integrity will decrease 05/19/2022 0521 by Parke Poisson, RN Outcome: Progressing 05/19/2022 0521 by Parke Poisson, RN Outcome: Progressing

## 2022-05-19 NOTE — Progress Notes (Addendum)
Delivered the [facility] medication package to the patient's medication bin in the medication room on 8/29 @ 1530. Chemotherapy RN will mix the medication at bedside before administering.  Dara Hoyer, PharmD PGY-1 Pharmacy Resident 05/19/2022 3:37 PM

## 2022-05-19 NOTE — Progress Notes (Signed)
Urology consult  Urology called to assist with replacement of Foley catheter.  Apparently, the Foley catheter was removed today at 10 AM and the patient has yet to void and becoming uncomfortable.  Nursing attempted Foley catheter placement x3 including coud x2.  Upon my arrival, bladder scan 688 cc.  His bladder was palpably distended.  Patient was prepped and draped in the standard sterile fashion.  Lidojet was admi nistered.  An 57 French coud catheter was advanced quite easily into the bladder with urine return very light pink and hubbed.  The balloon was filled with 10 cc of sterile water.  Catheter was secured.

## 2022-05-19 NOTE — Progress Notes (Signed)
Patient received Firmagon injection to the left and right lower abd. No complaints. Patient tolerated injections well. Report given to primary RN.

## 2022-05-19 NOTE — Progress Notes (Signed)
Steven Elliott   DOB:[birth year]   UU#:[id number]    Subjective: Patient had an episode of acute urinary retention overnight.  Patient had to have Foley catheter placed.  No blood in urine.  Denies any pain.   Objective:  Vitals:   05/19/22 0748 05/19/22 1524  BP: 128/64 130/63  Pulse: 84 95  Resp: 16 16  Temp: 97.9 F (36.6 C) 98.2 F (36.8 C)  SpO2: 97% 93%     Intake/Output Summary (Last 24 hours) at 05/19/2022 1914 Last data filed at 05/19/2022 1912 Gross per 24 hour  Intake 480 ml  Output 1450 ml  Net -970 ml     Physical Exam Vitals and nursing note reviewed.  HENT:     Head: Normocephalic and atraumatic.     Mouth/Throat:     Pharynx: Oropharynx is clear.  Eyes:     Extraocular Movements: Extraocular movements intact.     Pupils: Pupils are equal, round, and reactive to light.  Cardiovascular:     Rate and Rhythm: Normal rate and regular rhythm.  Pulmonary:     Comments: Decreased breath sounds bilaterally.  Abdominal:     Palpations: Abdomen is soft.  Musculoskeletal:        General: Normal range of motion.     Cervical back: Normal range of motion.  Skin:    General: Skin is warm.  Neurological:     General: No focal deficit present.     Mental Status: He is alert.  Psychiatric:        Behavior: Behavior normal.        Judgment: Judgment normal.      Labs:  Lab Results  Component Value Date   WBC 16.0 (H) 05/19/2022   HGB 12.6 (L) 05/19/2022   HCT 37.9 (L) 05/19/2022   MCV 96.4 05/19/2022   PLT 269 05/19/2022   NEUTROABS 13.6 (H) 05/19/2022    Lab Results  Component Value Date   NA 138 05/19/2022   K 4.2 05/19/2022   CL 104 05/19/2022   CO2 29 05/19/2022    Studies:  No results found.  88 year old male patient with multiple comorbidities including mild -moderate dementia and recently diagnosed prostate cancer is currently in the hospital for acute retention  of urine/hematuria.   # Metast[facility]as per  [facility]; [facility]]-with involvement of the ribs on bone scan.  PSA- 29.  Discussed with urology-proceed with Firmagon loading dose 240 mg x 1 today.  Discussed that patient will need Firmagon injections on a monthly basis; or Eligard every 6 months.  Defer to [facility] for further management.   #Acute retention of urine/hematuria-likely secondary to local invasion of the bladder by prostate malignancy-s/p Foley catheter.  Defer to urology for further recommendations.  Discussed with Dr. Zenon Mayo off any palliative radiation at this time.  # I discussed the above plan of care with the patient's daughter Steven Elliott at length.  Encouraged that she keep the appointments at the [facility] as planned on 8/31.  Recommend speaking to hospitalist service regarding discharge planning.  She is in agreement.   Cammie Sickle, MD 05/19/2022  7:14 PM

## 2022-05-20 DIAGNOSIS — F0390 Unspecified dementia without behavioral disturbance: Secondary | ICD-10-CM

## 2022-05-20 DIAGNOSIS — N3001 Acute cystitis with hematuria: Secondary | ICD-10-CM

## 2022-05-20 DIAGNOSIS — R338 Other retention of urine: Secondary | ICD-10-CM | POA: Diagnosis not present

## 2022-05-20 DIAGNOSIS — C7951 Secondary malignant neoplasm of bone: Secondary | ICD-10-CM

## 2022-05-20 DIAGNOSIS — I10 Essential (primary) hypertension: Secondary | ICD-10-CM | POA: Diagnosis not present

## 2022-05-20 DIAGNOSIS — N401 Enlarged prostate with lower urinary tract symptoms: Secondary | ICD-10-CM

## 2022-05-20 DIAGNOSIS — C61 Malignant neoplasm of prostate: Secondary | ICD-10-CM | POA: Diagnosis not present

## 2022-05-20 DIAGNOSIS — N481 Balanitis: Secondary | ICD-10-CM | POA: Diagnosis not present

## 2022-05-20 LAB — BASIC METABOLIC PANEL
Anion gap: 8 (ref 5–15)
BUN: 12 mg/dL (ref 8–23)
CO2: 26 mmol/L (ref 22–32)
Calcium: 8.9 mg/dL (ref 8.9–10.3)
Chloride: 100 mmol/L (ref 98–111)
Creatinine, Ser: 0.85 mg/dL (ref 0.61–1.24)
GFR, Estimated: >60 mL/min (ref >60)
Glucose, Bld: 126 mg/dL — ABNORMAL HIGH (ref 70–99)
Potassium: 4.1 mmol/L (ref 3.5–5.1)
Sodium: 134 mmol/L — ABNORMAL LOW (ref 135–145)

## 2022-05-20 MED ORDER — SODIUM CHLORIDE 0.9 % IV BOLUS
500.0000 mL | Freq: Once | INTRAVENOUS | Status: AC
  Administered 2022-05-20: 500 mL via INTRAVENOUS

## 2022-05-20 NOTE — Progress Notes (Signed)
Physical Therapy Treatment Patient Details Name: Steven Elliott MRN: [id number] DOB: 1930-02-24 Today's Date: 05/20/2022   History of Present Illness Pt is a 88 y.o. male presenting to hospital 8/20 with c/o diarrhea and penile pain.  Recent treatment for balanitis.  Developed gross hematuria during hospitalization (8/22).  Pt admitted with balanitis, UTI, htn, diarrhea, and abnormal LFT's.  PMH includes prostate CA with urinary retention (with indwelling foley), known bone metastases, COPD, htn, diverticular bleeding, dementia, CKD.    PT Comments    Pt was sitting in recliner with supportive daughter at bedside. Pt is lethargic but alert and cooperative. Uses humor to hide some cognition deficits. BP in sitting 116/56 dropped to 96/56. He c/o dizziness so elected not to progress away from EOB/recliner. He did stand a total of 3 x before getting back into bed. Quickly drifts back to sleep once in bed. Lengthy discussion with pt's daughter about DC disposition. Pt is not at baseline and family is concerns about being able to provide the amount of care he will require. Pt's daughters to discuss tonight. PT will return tomorrow to progress as able.    Recommendations for follow up therapy are one component of a multi-disciplinary discharge planning process, led by the attending physician.  Recommendations may be updated based on patient status, additional functional criteria and insurance authorization.  Follow Up Recommendations  Other (comment) (currently pt is not at baseline abilities. Lengthy discussion with pt's daughter about DC plan. " I will talk to my sister (the one who lives with pt) tonight about weather or not we can manage." Acute PT will return tomorrow.)     Assistance Recommended at Discharge Frequent or constant Supervision/Assistance  Patient can return home with the following A lot of help with walking and/or transfers;A little help with bathing/dressing/bathroom;Assistance  with cooking/housework;Direct supervision/assist for medications management;Direct supervision/assist for financial management;Help with stairs or ramp for entrance;Assist for transportation   Equipment Recommendations  Other (comment) ([facility].)       Precautions / Restrictions Precautions Precautions: Fall Restrictions Weight Bearing Restrictions: No     Mobility  Bed Mobility Overal bed mobility: Needs Assistance Bed Mobility: Sit to Supine  Sit to supine: Min assist  General bed mobility comments: Min assist required to progress LEs back into bed from EOB short sit    Transfers Overall transfer level: Needs assistance Equipment used: Rolling walker (2 wheels) Transfers: Sit to/from Stand Sit to Stand: Min guard, Min assist    General transfer comment: MIn assist on first STS from recliner however CGA afterwards. Poor carryover between trials. VCs for handplacement, fwd wt shift, and overall improved sequencing. Pt does c/o dizziness with standing. BP dropped from 116/ 56 to 96/56. Pt did not feel up for advancing to ambulation. he requested to return to bed and quickly drifted back to sleep.    Ambulation/Gait Ambulation/Gait assistance: Min guard Gait Distance (Feet): 5 Feet Assistive device: Rolling walker (2 wheels) Gait Pattern/deviations: Step-to pattern, Trunk flexed Gait velocity: decreased     General Gait Details: pt only ambulated form recliner to EOB. at baseline is able to ambulate much greater distances.    Balance Overall balance assessment: Needs assistance Sitting-balance support: No upper extremity supported, Feet supported Sitting balance-Leahy Scale: Fair     Standing balance support: Bilateral upper extremity supported, During functional activity, Reliant on assistive device for balance Standing balance-Leahy Scale: Fair       Cognition Arousal/Alertness:  Lethargic, Suspect due to  medications Behavior During Therapy: WFL for tasks assessed/performed Overall Cognitive Status: Within Functional Limits for tasks assessed      General Comments: Pt was awake but does struggle to stay awake at times. He agreed to session but was limited. Likes to use humor to cover some cognition deficits           General Comments General comments (skin integrity, edema, etc.): Lengthy discussion with pt/pt's daughter about DC disposition. Daughter is concerned about pt's change in status from PLOF. " I will talk to my sister tonight and will decide if we are able to take him home or not."      Pertinent Vitals/Pain Pain Assessment Pain Assessment:  (c/o pain in feet when not wearing shoes)     PT Goals (current goals can now be found in the care plan section) Acute Rehab PT Goals Patient Stated Goal: none stated Progress towards PT goals: Progressing toward goals    Frequency    Min 2X/week      PT Plan Current plan remains appropriate       AM-PAC PT "6 Clicks" Mobility   Outcome Measure  Help needed turning from your back to your side while in a flat bed without using bedrails?: None Help needed moving from lying on your back to sitting on the side of a flat bed without using bedrails?: A Little Help needed moving to and from a bed to a chair (including a wheelchair)?: A Little Help needed standing up from a chair using your arms (e.g., wheelchair or bedside chair)?: A Little Help needed to walk in hospital room?: A Lot Help needed climbing 3-5 steps with a railing? : A Lot 6 Click Score: 17    End of Session   Activity Tolerance: Patient tolerated treatment well;Patient limited by lethargy;Patient limited by fatigue Patient left: in bed;with call bell/phone within reach;with bed alarm set;with family/visitor present Nurse Communication: Mobility status;Precautions PT Visit Diagnosis: Unsteadiness on feet (R26.81);Muscle weakness (generalized) (M62.81)      Time: 4580-9983 PT Time Calculation (min) (ACUTE ONLY): 23 min  Charges:  $Therapeutic Activity: 23-37 mins                     Julaine Fusi PTA 05/20/22, 4:25 PM

## 2022-05-20 NOTE — Assessment & Plan Note (Signed)
Metastatic prostate cancer to bone.  Patient given Mills Koller loading dose of 240 mg on 05/19/2022.  Continue Casodex.  Will need Firmagon injections on a monthly basis or Eligard every 6 months.

## 2022-05-20 NOTE — Plan of Care (Signed)
°  Problem: Education: °Goal: Knowledge of General Education information will improve °Description: Including pain rating scale, medication(s)/side effects and non-pharmacologic comfort measures °Outcome: Progressing °  °Problem: Health Behavior/Discharge Planning: °Goal: Ability to manage health-related needs will improve °Outcome: Progressing °  °Problem: Clinical Measurements: °Goal: Ability to maintain clinical measurements within normal limits will improve °Outcome: Progressing °Goal: Will remain free from infection °Outcome: Progressing °Goal: Diagnostic test results will improve °Outcome: Progressing °  °Problem: Activity: °Goal: Risk for activity intolerance will decrease °Outcome: Progressing °  °Problem: Nutrition: °Goal: Adequate nutrition will be maintained °Outcome: Progressing °  °Problem: Coping: °Goal: Level of anxiety will decrease °Outcome: Progressing °  °Problem: Pain Managment: °Goal: General experience of comfort will improve °Outcome: Progressing °  °Problem: Safety: °Goal: Ability to remain free from injury will improve °Outcome: Progressing °  °Problem: Skin Integrity: °Goal: Risk for impaired skin integrity will decrease °Outcome: Progressing °  °

## 2022-05-20 NOTE — Progress Notes (Signed)
PT Cancellation Note  Patient Details Name: Steven Elliott MRN: [id number] DOB: [birth year]   Cancelled Treatment:     PT attempt, 3rd attempt today but will return after lunch as requested. Pt's daughter has been in room throughout this morning. First 2 attempts, pt was eating breakfast; wide awake and conversational. Currently pt is extremely lethargic. Only able to open one eye due to fatigue. Will return later this afternoon as requested.    Willette Pa 05/20/2022, 11:02 AM

## 2022-05-20 NOTE — TOC Progression Note (Signed)
Transition of Care [facility]) - Progression Note    Patient Details  Name: Steven Elliott MRN: [id number] Date of Birth: 1929-12-05  Transition of Care [facility]) CM/SW [place], RN Phone Number: 05/20/2022, 10:49 AM  Clinical Narrative:    Met with the patient's daughter in the room, she does not recall speaking with me even after I reiterated our conversation, She tells me again that the [name] is supposed to be delivering a Hospital bed, a 3 in 1, an alarm, and a bedside table, I asked her if she knows when they are to do this, She said no that they had a video conference with the [place], I asked for the contact information of the person that she spoke to, she is looking for the information and will call me with it   Expected Discharge Plan: [place] Barriers to Discharge: Continued Medical Work up  Expected Discharge Plan and Services Expected Discharge Plan: [place]   Discharge Planning Services: CM Consult   Living arrangements for the past 2 months: Single Family Home                 DME Arranged: N/A         HH Arranged: PT, RN [place] Agency: [place] ([place]) Date HH Agency Contacted: 05/18/22 Time [place]: [place] Representative spoke with at [place]: [place] ([place]) Interventions    Readmission Risk Interventions     No data to display

## 2022-05-20 NOTE — TOC Progression Note (Signed)
Transition of Care [facility]) - Progression Note    Patient Details  Name: SALEH ULBRICH MRN: [id number] Date of Birth: 03-22-1930  Transition of Care [facility]) CM/SW [place], RN Phone Number: 05/20/2022, 11:18 AM  Clinical Narrative:    Daughter Contacted me back from [phone] She stated that she contacted the [name] and is checking on when the DME is going to be delivered, she stated that they tell her it is thru a vendor and they are checking on it and will call her back and that she will let ,me know, She did not want to provide me with the [place] contact person she spoke with but stated that she will let me know   Expected Discharge Plan: [place] Barriers to Discharge: Continued Medical Work up  Expected Discharge Plan and Services Expected Discharge Plan: [place]   Discharge Planning Services: CM Consult   Living arrangements for the past 2 months: Single Family Home                 DME Arranged: N/A         HH Arranged: PT, RN [place] Agency: [place] ([place]) Date HH Agency Contacted: 05/18/22 Time [place]: [place] Representative spoke with at [place]: [place] Determinants of Health ([place]) Interventions    Readmission Risk Interventions     No data to display

## 2022-05-20 NOTE — Assessment & Plan Note (Signed)
Foley catheter replaced by urology.  Will have to go home with Foley catheter.  Continue Flomax.

## 2022-05-20 NOTE — Progress Notes (Signed)
PT Cancellation Note  Patient Details Name: Steven Elliott MRN: [id number] DOB: 24-Aug-1930   Cancelled Treatment:     PT attempt. Pt was back in bed sleeping soundly. Daughter at bedside states," I don't think he is up for PT today. He has been sleeping all day and I think he needs the rest." She still is planning to take pt home at DC once all the equipment has been delivered.    Willette Pa 05/20/2022, 1:24 PM

## 2022-05-20 NOTE — Progress Notes (Signed)
Occupational Therapy Treatment Patient Details Name: Steven Elliott MRN: [id number] DOB: 16-Jul-1930 Today's Date: 05/20/2022   History of present illness Pt is a 88 y.o. male presenting to hospital 8/20 with c/o diarrhea and penile pain.  Recent treatment for balanitis.  Developed gross hematuria during hospitalization (8/22).  Pt admitted with balanitis, UTI, htn, diarrhea, and abnormal LFT's.  PMH includes prostate CA with urinary retention (with indwelling foley), known bone metastases, COPD, htn, diverticular bleeding, dementia, CKD.   OT comments  Chart reviewed, nurse cleared pt for participation in OT tx session. Pt asleep, however easily awoken and agreeable with encouragement for mobility. Pt endorses feeling "sleepy" with increased fatigue on this date. Tx session targeted improving activity tolerance and endurance in preparation for improved independent ADL completion. MIN A required for bed mobility with HOB raised, STS with CGA, short amb transfer to bedside chair with supervision with RW. Pt is making progress towards goals, discharge recommendation remains appropriate. Significant education provided t opt and daughter re: importance of progressing mobility while hospitalized to ensure safe discharge home as pt has 3 STE. Pt daughter endorses she feels he can still return home with their help. Pt is left in bedside chair, all needs met. OT will follow acutely.    Recommendations for follow up therapy are one component of a multi-disciplinary discharge planning process, led by the attending physician.  Recommendations may be updated based on patient status, additional functional criteria and insurance authorization.    Follow Up Recommendations  Home health OT    Assistance Recommended at Discharge Frequent or constant Supervision/Assistance  Patient can return home with the following  A little help with walking and/or transfers;A little help with  bathing/dressing/bathroom;Assistance with cooking/housework;Assist for transportation;Help with stairs or ramp for entrance;Direct supervision/assist for medications management   Equipment Recommendations  Other (comment) (sock aid)    Recommendations for Other Services      Precautions / Restrictions Precautions Precautions: Fall Restrictions Weight Bearing Restrictions: No       Mobility Bed Mobility Overal bed mobility: Needs Assistance Bed Mobility: Supine to Sit     Supine to sit: Min assist, HOB elevated          Transfers Overall transfer level: Needs assistance Equipment used: Rolling walker (2 wheels) Transfers: Sit to/from Stand Sit to Stand: Supervision                 Balance Overall balance assessment: Needs assistance Sitting-balance support: No upper extremity supported, Feet supported Sitting balance-Leahy Scale: Fair     Standing balance support: Bilateral upper extremity supported, During functional activity, Reliant on assistive device for balance Standing balance-Leahy Scale: Fair                             ADL either performed or assessed with clinical judgement   ADL Overall ADL's : Needs assistance/impaired Eating/Feeding: Sitting;Set up                   Lower Body Dressing: Maximal assistance   Toilet Transfer: Supervision/safety Toilet Transfer Details (indicate cue type and reason): simulated, short amb transfer to bedside chair         Functional mobility during ADLs: Supervision/safety;Rolling walker (2 wheels)      Extremity/Trunk Assessment              Vision       Perception     Praxis  Cognition Arousal/Alertness: Awake/alert Behavior During Therapy: WFL for tasks assessed/performed Overall Cognitive Status: Within Functional Limits for tasks assessed                                          Exercises      Shoulder Instructions       General  Comments      Pertinent Vitals/ Pain       Pain Assessment Pain Assessment: No/denies pain  Home Living                                          Prior Functioning/Environment              Frequency  Min 2X/week        Progress Toward Goals  OT Goals(current goals can now be found in the care plan section)  Progress towards OT goals: Progressing toward goals     Plan Discharge plan remains appropriate    Co-evaluation                 AM-PAC OT "6 Clicks" Daily Activity     Outcome Measure   Help from another person eating meals?: None Help from another person taking care of personal grooming?: A Little Help from another person toileting, which includes using toliet, bedpan, or urinal?: A Little Help from another person bathing (including washing, rinsing, drying)?: A Little Help from another person to put on and taking off regular upper body clothing?: A Little Help from another person to put on and taking off regular lower body clothing?: A Lot 6 Click Score: 18    End of Session Equipment Utilized During Treatment: Rolling walker (2 wheels)  OT Visit Diagnosis: Other abnormalities of gait and mobility (R26.89);Muscle weakness (generalized) (M62.81);History of falling (Z91.81)   Activity Tolerance Patient tolerated treatment well   Patient Left in chair;with call bell/phone within reach;with chair alarm set;with nursing/sitter in room;with family/visitor present   Nurse Communication Mobility status        Time: 1610-9604 OT Time Calculation (min): 19 min  Charges: OT General Charges $OT Visit: 1 Visit OT Treatments $Therapeutic Activity: 8-22 mins  Shanon Payor, OTD OTR/L  05/20/22, 3:30 PM

## 2022-05-20 NOTE — Progress Notes (Addendum)
Progress Note   Patient: Steven Elliott SJG:[id number] DOB: 05-19-1930 DOA: 05/10/2022     10 DOS: the patient was seen and examined on 05/20/2022     Assessment and Plan: * Prostate cancer metastatic to bone [facility]) Metastatic prostate cancer to bone.  Patient given Mills Koller loading dose of 240 mg on 05/19/2022.  Continue Casodex.  Will need Firmagon injections on a monthly basis or Eligard every 6 months.  Acute urinary retention Foley catheter replaced by urology.  Will have to go home with Foley catheter.  Continue Flomax.  Balanitis Balanitis and UTI: Treated during the hospital course.  Hypertension Hold Norvasc.  Blood pressure dropped with standing today with physical therapy.  We will give fluid bolus and recheck tomorrow.  UTI (urinary tract infection) - See above  BPH (benign prostatic hyperplasia) Patient has urinary retention -S/p of Foley catheter -Flomax  Diarrhea Has negative C. difficile and GI panel  Abnormal LFTs AST trending from 152 down to 47, ALT from 81 down to 54, total bilirubin normal range, alkaline phosphatase down from 2 53-1 95.  COPD (chronic obstructive pulmonary disease) (HCC) Stable -Bronchodilators  Chronic kidney disease, stage 3a (HCC) Creatinine 0.85 with a GFR greater than 60  Dementia ([place]) Note behavior disturbance -Continue home Namenda, galantamine        Subjective: Patient feels okay.  Offers no complaints.  Seen while eating breakfast.  Received chemo injection yesterday.  Daughter is concerned about taking him home.  Last physical therapy note recommended home with home health.  [facility]  Physical Exam: Vitals:   05/19/22 2140 05/20/22 0517 05/20/22 0738 05/20/22 1540  BP: (!) 169/75 119/61 (!) 130/57 (!) 116/56  Pulse: 100 (!) 103 100 97  Resp: '17 16 16   '$ Temp: 99.7 F (37.6 C) 98.3 F (36.8 C) 99.4 F (37.4 C)   TempSrc:      SpO2: 93% 93% 91% 94%  Weight:      Height:        Physical Exam HENT:     Head: Normocephalic.     Mouth/Throat:     Pharynx: No oropharyngeal exudate.  Eyes:     General: Lids are normal.     Conjunctiva/sclera: Conjunctivae normal.  Cardiovascular:     Rate and Rhythm: Normal rate and regular rhythm.     Heart sounds: Normal heart sounds, S1 normal and S2 normal.  Pulmonary:     Breath sounds: No decreased breath sounds, wheezing, rhonchi or rales.  Abdominal:     Palpations: Abdomen is soft.     Tenderness: There is no abdominal tenderness.  Musculoskeletal:     Right lower leg: No swelling.     Left lower leg: No swelling.  Skin:    General: Skin is warm.     Findings: No rash.  Neurological:     Mental Status: He is alert.     Comments: Answers questions appropriately     Data Reviewed: Sodium 134, creatinine 0.85 last white blood cell count 16.0 last hemoglobin 12.6, platelet count 269.  PSA on 05/18/1999 2329.81  Family Communication: Spoke with 1 daughter at the bedside and 1 daughter on the phone  Disposition: Status is: Inpatient Remains inpatient appropriate because: Family concerned about taking him home until the hospital bed is delivered.  With physical therapy this afternoon was orthostatic.  Planned Discharge Destination: Home with Home Health    Time spent: 29 minutes Case discussed with physical therapy and  TSH  Author: Loletha Grayer, MD 05/20/2022 4:04 PM  For on call review [email or web address].

## 2022-05-21 DIAGNOSIS — C61 Malignant neoplasm of prostate: Secondary | ICD-10-CM | POA: Diagnosis not present

## 2022-05-21 DIAGNOSIS — N179 Acute kidney failure, unspecified: Secondary | ICD-10-CM | POA: Diagnosis not present

## 2022-05-21 DIAGNOSIS — E871 Hypo-osmolality and hyponatremia: Secondary | ICD-10-CM

## 2022-05-21 DIAGNOSIS — R062 Wheezing: Secondary | ICD-10-CM

## 2022-05-21 DIAGNOSIS — I951 Orthostatic hypotension: Secondary | ICD-10-CM | POA: Diagnosis not present

## 2022-05-21 DIAGNOSIS — R338 Other retention of urine: Secondary | ICD-10-CM | POA: Diagnosis not present

## 2022-05-21 HISTORY — DX: Acute kidney failure, unspecified: N17.9

## 2022-05-21 LAB — CBC
HCT: 33.9 % — ABNORMAL LOW (ref 39.0–52.0)
Hemoglobin: 11.3 g/dL — ABNORMAL LOW (ref 13.0–17.0)
MCH: 32.3 pg (ref 26.0–34.0)
MCHC: 33.3 g/dL (ref 30.0–36.0)
MCV: 96.9 fL (ref 80.0–100.0)
Platelets: 208 10*3/uL (ref 150–400)
RBC: 3.5 MIL/uL — ABNORMAL LOW (ref 4.22–5.81)
RDW: 13.3 % (ref 11.5–15.5)
WBC: 16.6 10*3/uL — ABNORMAL HIGH (ref 4.0–10.5)
nRBC: 0 % (ref 0.0–0.2)

## 2022-05-21 LAB — COMPREHENSIVE METABOLIC PANEL
ALT: 18 U/L (ref 0–44)
AST: 23 U/L (ref 15–41)
Albumin: 2.9 g/dL — ABNORMAL LOW (ref 3.5–5.0)
Alkaline Phosphatase: 90 U/L (ref 38–126)
Anion gap: 6 (ref 5–15)
BUN: 23 mg/dL (ref 8–23)
CO2: 28 mmol/L (ref 22–32)
Calcium: 8.4 mg/dL — ABNORMAL LOW (ref 8.9–10.3)
Chloride: 100 mmol/L (ref 98–111)
Creatinine, Ser: 1.38 mg/dL — ABNORMAL HIGH (ref 0.61–1.24)
GFR, Estimated: 49 mL/min — ABNORMAL LOW (ref >60)
Glucose, Bld: 116 mg/dL — ABNORMAL HIGH (ref 70–99)
Potassium: 4.7 mmol/L (ref 3.5–5.1)
Sodium: 134 mmol/L — ABNORMAL LOW (ref 135–145)
Total Bilirubin: 1 mg/dL (ref 0.3–1.2)
Total Protein: 6.9 g/dL (ref 6.5–8.1)

## 2022-05-21 MED ORDER — ENSURE ENLIVE PO LIQD
237.0000 mL | Freq: Three times a day (TID) | ORAL | Status: DC
  Administered 2022-05-21 – 2022-05-22 (×3): 237 mL via ORAL

## 2022-05-21 MED ORDER — SODIUM CHLORIDE 0.9 % IV BOLUS
500.0000 mL | Freq: Once | INTRAVENOUS | Status: AC
  Administered 2022-05-21: 500 mL via INTRAVENOUS

## 2022-05-21 MED ORDER — ALBUTEROL SULFATE (2.5 MG/3ML) 0.083% IN NEBU
3.0000 mL | INHALATION_SOLUTION | Freq: Three times a day (TID) | RESPIRATORY_TRACT | Status: DC
  Administered 2022-05-21 – 2022-05-22 (×3): 3 mL via RESPIRATORY_TRACT
  Filled 2022-05-21: qty 3
  Filled 2022-05-21: qty 9
  Filled 2022-05-21: qty 3

## 2022-05-21 NOTE — NC FL2 (Signed)
[place] LEVEL OF CARE SCREENING TOOL     IDENTIFICATION  Patient Name: Steven Elliott Birthdate: August 30, 1930 Sex: male Admission Date (Current Location): 05/10/2022  [place] and [place] Number:  Engineering geologist and Address:  [facility], [address] [place], [place] [address]      Provider Number: [id number]  Attending Physician Name and Address:  Loletha Grayer, MD  Relative Name and Phone Number:  [name] [phone]    Current Level of Care: Hospital Recommended Level of Care: [place] Prior Approval Number:    Date Approved/Denied:   PASRR Number: [id number]  Discharge Plan: SNF    Current Diagnoses: Patient Active Problem List   Diagnosis Date Noted   AKI (acute kidney injury) ([place]) 05/21/2022   Orthostatic hypotension 05/21/2022   Wheeze 05/21/2022   Hyponatremia 05/21/2022   Prostate cancer metastatic to bone ([place]) 05/20/2022   Acute urinary retention 05/20/2022   Urethritis 05/11/2022   Balanitis 05/10/2022   UTI (urinary tract infection) 05/10/2022   BPH (benign prostatic hyperplasia)    Dementia (HCC)    Diarrhea    Abnormal LFTs    COPD (chronic obstructive pulmonary disease) ([place])    Chronic kidney disease, stage 3a ([place])    Pneumoperitoneum    Diverticular hemorrhage 03/22/2016   Abdominal aortic aneurysm ([place]) 03/22/2016   Rectal bleeding 03/20/2016    Orientation RESPIRATION BLADDER Height & Weight     Self, Time, Situation, Place  O2 ([place] 2) Incontinent, Indwelling catheter Weight: 155 lb 12.8 oz (70.7 kg) Height:  '5\' 8"'$  (172.7 cm)  BEHAVIORAL SYMPTOMS/MOOD NEUROLOGICAL BOWEL NUTRITION STATUS      Continent Diet (See DC summary)  AMBULATORY STATUS COMMUNICATION OF NEEDS Skin   Limited Assist Verbally Skin abrasions (Bilateral groin redness)                       Personal Care Assistance Level of Assistance  Bathing, Feeding, Dressing Bathing Assistance: Limited  assistance Feeding assistance: Limited assistance Dressing Assistance: Limited assistance     Functional Limitations Info  Sight, Hearing, Speech Sight Info: Adequate Hearing Info: Impaired Speech Info: Adequate    SPECIAL CARE FACTORS FREQUENCY  PT (By licensed PT), OT (By licensed OT)     PT Frequency: 5x week OT Frequency: 5x week            Contractures Contractures Info: Not present    Additional Factors Info  Code Status, Allergies, Psychotropic Code Status Info: Full Allergies Info: Procaine   Ciprofloxacin   Hydrocodone   Hydrocodone-acetaminophen   Lisinopril   Omeprazole   Penicillins   Rabeprazole Psychotropic Info: Duloxetine         Current Medications (05/21/2022):  This is the current hospital active medication list Current Facility-Administered Medications  Medication Dose Route Frequency Provider Last Rate Last Admin   acetaminophen (TYLENOL) tablet 650 mg  650 mg Oral Q6H PRN Ralene Muskrat B, MD   650 mg at 05/13/22 1311   albuterol (PROVENTIL) (2.5 MG/3ML) 0.083% nebulizer solution 3 mL  3 mL Inhalation TID Loletha Grayer, MD       alum & mag hydroxide-simeth (MAALOX/MYLANTA) 200-200-20 MG/5ML suspension 30 mL  30 mL Oral Q4H PRN Priscella Mann, Sudheer B, MD       bicalutamide (CASODEX) tablet 50 mg  50 mg Oral Daily Ivor Costa, MD   50 mg at 05/21/22 0919   calcium-vitamin D (OSCAL WITH D) 500-5 MG-MCG per tablet 1 tablet  1 tablet Oral BID Ivor Costa, MD   1 tablet at 05/21/22 3007   Chlorhexidine Gluconate Cloth 2 % PADS 6 each  6 each Topical Daily Ralene Muskrat B, MD   6 each at 05/21/22 1100   cyanocobalamin (VITAMIN B12) tablet 500 mcg  500 mcg Oral Daily Ivor Costa, MD   500 mcg at 05/21/22 0919   cyclobenzaprine (FLEXERIL) tablet 5 mg  5 mg Oral TID PRN Ivor Costa, MD   5 mg at 05/15/22 0908   dextromethorphan-guaiFENesin ([place] DM) 30-600 MG per 12 hr tablet 1 tablet  1 tablet Oral BID PRN Ivor Costa, MD       DULoxetine (CYMBALTA) DR  capsule 20 mg  20 mg Oral BID Ivor Costa, MD   20 mg at 05/21/22 6226   famotidine (PEPCID) tablet 20 mg  20 mg Oral BID Ralene Muskrat B, MD   20 mg at 05/21/22 3335   gabapentin (NEURONTIN) capsule 100 mg  100 mg Oral QHS Ivor Costa, MD   100 mg at 05/20/22 2236   galantamine (RAZADYNE ER) 24 hr capsule 16 mg  16 mg Oral Q breakfast Ivor Costa, MD   16 mg at 05/21/22 4562   hydrALAZINE (APRESOLINE) injection 5 mg  5 mg Intravenous Q2H PRN Ivor Costa, MD       lidocaine (XYLOCAINE) 2 % jelly 1 Application  1 Application Urethral Once Foust, Katy L, NP       loperamide (IMODIUM) capsule 2 mg  2 mg Oral BID PRN Ivor Costa, MD       loratadine (CLARITIN) tablet 10 mg  10 mg Oral Daily Ivor Costa, MD   10 mg at 05/21/22 5638   magnesium oxide (MAG-OX) tablet 400 mg  400 mg Oral Daily Ivor Costa, MD   400 mg at 05/21/22 0917   memantine (NAMENDA) tablet 10 mg  10 mg Oral BID Ivor Costa, MD   10 mg at 05/21/22 0920   morphine (PF) 2 MG/ML injection 2 mg  2 mg Intravenous Q3H PRN Ralene Muskrat B, MD   2 mg at 05/18/22 2314   multivitamin with minerals tablet 1 tablet  1 tablet Oral Daily Ivor Costa, MD   1 tablet at 05/21/22 0916   ondansetron (ZOFRAN) injection 4 mg  4 mg Intravenous Q8H PRN Ivor Costa, MD       oxybutynin (DITROPAN) tablet 5 mg  5 mg Oral Once Foust, Katy L, NP       oxyCODONE (Oxy IR/ROXICODONE) immediate release tablet 5 mg  5 mg Oral Q4H PRN Priscella Mann, Sudheer B, MD   5 mg at 05/20/22 0810   sodium chloride irrigation 0.9 % 3,000 mL  3,000 mL Irrigation Continuous Judd Gaudier V, MD       tamsulosin [facility]) capsule 0.4 mg  0.4 mg Oral QHS Ivor Costa, MD   0.4 mg at 05/20/22 2236   traZODone (DESYREL) tablet 50 mg  50 mg Oral Gordan Payment, MD   50 mg at 05/20/22 2236     Discharge Medications: Please see discharge summary for a list of discharge medications.  Relevant Imaging Results:  Relevant Lab Results:   Additional Information SS# [place] [phone], [place]

## 2022-05-21 NOTE — Assessment & Plan Note (Addendum)
Last sodium in normal range.  Was only one-point less than normal range yesterday.

## 2022-05-21 NOTE — Progress Notes (Signed)
Progress Note   Patient: Steven Elliott SWF:[id number] DOB: 1930/01/03 DOA: 05/10/2022     11 DOS: the patient was seen and examined on 05/21/2022    Assessment and Plan: * Prostate cancer metastatic to bone [facility]) Metastatic prostate cancer to bone.  Patient given [place] Koller loading dose of 240 mg on 05/19/2022.  Continue Casodex.  Will need Firmagon injections on a monthly basis or Eligard every 6 months.  Acute urinary retention Foley catheter replaced by urology.  Will have to go home with Foley catheter.  Continue Flomax.  AKI (acute kidney injury) ([place]) AKI on CKD stage IIIa.  Creatinine up to 1.38 today and was 0.85 on 05/20/2022.  Fluid bolus given yesterday afternoon and another fluid bolus given today.  Patient more alert today and eating better.  We will recheck creatinine tomorrow.  Orthostatic hypotension Norvasc held.  Another fluid bolus given today.  Balanitis Balanitis and UTI: Treated during the hospital course.  BPH (benign prostatic hyperplasia) Patient has urinary retention -S/p of Foley catheter -Flomax  UTI (urinary tract infection) Treated previously.  Urine culture actually negative.  Diarrhea Has negative C. difficile and GI panel  Abnormal LFTs AST trending from 152 down to 47, ALT from 81 down to 54, total bilirubin normal range, alkaline phosphatase down from 2 53-1 95.  COPD (chronic obstructive pulmonary disease) (HCC) Stable -Bronchodilators  Dementia (HCC) Note behavior disturbance -Continue home Namenda, galantamine  Hyponatremia Sodium one-point less than the normal range  Wheeze Audible wheeze heard but lungs sound clear.  We will get a chest x-ray tomorrow morning.  Nebulizer treatments.        Subjective: Patient states he feels pretty good.  (No complaints.  Did have an audible wheeze when I walked in the room.  Patient's daughter stated that he sometimes gets this if he overexerts.  Initially admitted with  diarrhea.  Physical Exam: Vitals:   05/20/22 1540 05/20/22 1952 05/21/22 0116 05/21/22 0852  BP: (!) 116/56 (!) 122/48 (!) 117/59 (!) 116/50  Pulse: 97 (!) 103 98 (!) 106  Resp:  '18 17 16  '$ Temp:  97.8 F (36.6 C) 98.4 F (36.9 C) 98 F (36.7 C)  TempSrc:      SpO2: 94% 100% 99% 99%  Weight:      Height:       Physical Exam HENT:     Head: Normocephalic.     Mouth/Throat:     Pharynx: No oropharyngeal exudate.  Eyes:     General: Lids are normal.     Conjunctiva/sclera: Conjunctivae normal.  Cardiovascular:     Rate and Rhythm: Normal rate and regular rhythm.     Heart sounds: Normal heart sounds, S1 normal and S2 normal.  Pulmonary:     Breath sounds: Transmitted upper airway sounds present. No decreased breath sounds, wheezing, rhonchi or rales.     Comments: Audible wheezing is heard without the stethoscope.  But when listening to his lungs the lungs were clear. Abdominal:     Palpations: Abdomen is soft.     Tenderness: There is no abdominal tenderness.  Musculoskeletal:     Right lower leg: No swelling.     Left lower leg: No swelling.  Skin:    General: Skin is warm.     Findings: No rash.  Neurological:     Mental Status: He is alert.     Comments: Answers questions appropriately     Data Reviewed: Sodium 134, creatinine 1.38, white blood cell count 16.6,  hemoglobin 11.3, platelet count 208  Family Communication: Daughter at the bedside  Disposition: Status is: Inpatient Remains inpatient appropriate because: Family now interested in rehab.  Transitional care team looking into [name] benefits.  Planned Discharge Destination: Rehab    Time spent: 28 minutes  Author: Loletha Grayer, MD 05/21/2022 3:00 PM  For on call review [email or web address].

## 2022-05-21 NOTE — Plan of Care (Signed)
  Problem: Education: Goal: Knowledge of General Education information will improve Description: Including pain rating scale, medication(s)/side effects and non-pharmacologic comfort measures Outcome: Progressing   Problem: Clinical Measurements: Goal: Diagnostic test results will improve Outcome: Progressing   Problem: Clinical Measurements: Goal: Respiratory complications will improve Outcome: Progressing   Problem: Activity: Goal: Risk for activity intolerance will decrease Outcome: Progressing   Problem: Nutrition: Goal: Adequate nutrition will be maintained Outcome: Progressing   Problem: Coping: Goal: Level of anxiety will decrease Outcome: Progressing   Problem: Elimination: Goal: Will not experience complications related to bowel motility Outcome: Progressing   Problem: Pain Managment: Goal: General experience of comfort will improve Outcome: Progressing   Problem: Safety: Goal: Ability to remain free from injury will improve Outcome: Progressing   Problem: Skin Integrity: Goal: Risk for impaired skin integrity will decrease Outcome: Progressing   Problem: Clinical Measurements: Goal: Ability to maintain clinical measurements within normal limits will improve Outcome: Progressing

## 2022-05-21 NOTE — Progress Notes (Addendum)
Physical Therapy Treatment Patient Details Name: Steven Elliott MRN: [id number] DOB: 1930/07/31 Today's Date: 05/21/2022   History of Present Illness Pt is a 88 y.o. male presenting to hospital 8/20 with c/o diarrhea and penile pain.  Recent treatment for balanitis.  Developed gross hematuria during hospitalization (8/22).  Pt admitted with balanitis, UTI, htn, diarrhea, and abnormal LFT's.  PMH includes prostate CA with urinary retention (with indwelling foley), known bone metastases, COPD, htn, diverticular bleeding, dementia, CKD.    PT Comments    Pt was sitting in recliner upon arriving. He is overall more alert than previous date. Pt's daughter/caregiver in room throughout. Pt was on O2 upon arriving but was discontinued throughout session with sao2 remaining >92% on rm air. Pt does get SOB but per daughter, happens at baseline. BP at rest;114/58(75), upon standing 90/67(74), and after ambulation 129/57 (77). No symptoms of dizziness throughout session. Pt tolerated ambulating ~ 50 ft CGA with RW without LOB. Gait distance was limited by author not patient. Overall pt demonstrated much improved abilities and safety with all OOB activity. Pt does not ambulate community distances at baseline but per family does better than he currently is.    Recommendations for follow up therapy are one component of a multi-disciplinary discharge planning process, led by the attending physician.  Recommendations may be updated based on patient status, additional functional criteria and insurance authorization.  Follow Up Recommendations  Follow physician's recommendations for discharge plan and follow up therapies (Pt's family do not feel they can safely manage pt at home.)     Assistance Recommended at Discharge Frequent or constant Supervision/Assistance  Patient can return home with the following A little help with walking and/or transfers;A little help with bathing/dressing/bathroom;Assistance with  cooking/housework;Direct supervision/assist for financial management;Direct supervision/assist for medications management;Help with stairs or ramp for entrance;Assist for transportation   Equipment Recommendations  Other (comment) (pt is awaiting equipment to be delivered from the [name].)       Precautions / Restrictions Precautions Precautions: Fall Restrictions Weight Bearing Restrictions: No     Mobility  Bed Mobility    General bed mobility comments: In recliner pre/post session    Transfers Overall transfer level: Needs assistance Equipment used: Rolling walker (2 wheels) Transfers: Sit to/from Stand Sit to Stand: Min guard  General transfer comment: CGA for safety only. vcs for handplacement and increased fwd wt shift    Ambulation/Gait Ambulation/Gait assistance: Min guard Gait Distance (Feet): 50 Feet Assistive device: Rolling walker (2 wheels) Gait Pattern/deviations: Step-through pattern, Trunk flexed Gait velocity: decreased     General Gait Details: pt was able to ambulate 50 ft without LOB. gait distance limited by author due to SOB/fatigue. HR and sao2 stable.    Balance Overall balance assessment: Needs assistance Sitting-balance support: No upper extremity supported, Feet supported Sitting balance-Leahy Scale: Fair Sitting balance - Comments: no LOB in sitting however weakness in core strength observed   Standing balance support: Reliant on assistive device for balance, During functional activity, Bilateral upper extremity supported Standing balance-Leahy Scale: Fair Standing balance comment: pt did not have LOB but is reliant on RW      Cognition Arousal/Alertness: Awake/alert Behavior During Therapy: WFL for tasks assessed/performed Overall Cognitive Status: Within Functional Limits for tasks assessed    General Comments: pt is much more alert today versus previous date. No symptoms of dizziness throughout session.               Pertinent  Vitals/Pain Pain Assessment Pain Assessment:  No/denies pain     PT Goals (current goals can now be found in the care plan section) Acute Rehab PT Goals Patient Stated Goal: none stated    Frequency    Min 2X/week      PT Plan Discharge plan needs to be updated;Other (comment) (family does not feel they can provide the level of care he current is requiring.)       AM-PAC PT "6 Clicks" Mobility   Outcome Measure  Help needed turning from your back to your side while in a flat bed without using bedrails?: None Help needed moving from lying on your back to sitting on the side of a flat bed without using bedrails?: A Little Help needed moving to and from a bed to a chair (including a wheelchair)?: A Little Help needed standing up from a chair using your arms (e.g., wheelchair or bedside chair)?: A Little Help needed to walk in hospital room?: A Little Help needed climbing 3-5 steps with a railing? : A Lot 6 Click Score: 18    End of Session   Activity Tolerance: Patient tolerated treatment well Patient left: in chair;with call bell/phone within reach;with chair alarm set;with family/visitor present Nurse Communication: Mobility status;Precautions PT Visit Diagnosis: Unsteadiness on feet (R26.81);Muscle weakness (generalized) (M62.81)     Time:  -     Charges:                       Julaine Fusi PTA 05/21/22, 1:56 PM

## 2022-05-21 NOTE — Assessment & Plan Note (Addendum)
AKI on CKD stage IIIa.  Creatinine up to 1.38 on 05/21/2022.  Creatinine down to 0.94 on 05/22/2022.  Received fluid boluses on 05/20/2022 and 05/21/2022

## 2022-05-21 NOTE — TOC Progression Note (Signed)
Transition of Care [facility]) - Progression Note    Patient Details  Name: Steven Elliott MRN: [id number] Date of Birth: 23-Oct-1929  Transition of Care [facility]) CM/SW Contact  Coralee [place], [place] Phone Number: 05/21/2022, 3:40 PM  Clinical Narrative:    CSW notified that pt's family is interested in SNF, CSW met with daughters at the bedside. Daughters noted interest and stated pt had been to [facility] in the past, they are also interested in [facility] [place] and [place]. They are agreeable to further fax out for more options. Daughters noted concern for pt's R knee, CSW will notify medical team. CSW will send referrals and follow up with family to determine disposition. TOC will continue to follow.   Expected Discharge Plan: [place] Barriers to Discharge: Continued Medical Work up, SNF Pending bed offer  Expected Discharge Plan and Services Expected Discharge Plan: [place]   Discharge Planning Services: CM Consult Post Acute Care Choice: [place] Living arrangements for the past 2 months: Single Family Home                 DME Arranged: N/A         HH Arranged: PT, RN [place] Agency: [place] ([place]) Date HH Agency Contacted: 05/18/22 Time [place]: 1224 Representative spoke with at [place]: Corene Cornea   Social Determinants of Health ([place]) Interventions    Readmission Risk Interventions     No data to display

## 2022-05-21 NOTE — Assessment & Plan Note (Addendum)
Resolved.  Continue as needed inhaler.

## 2022-05-21 NOTE — Assessment & Plan Note (Addendum)
Continue to hold Norvasc.  I am hesitant at this point getting rid of Flomax secondary to acute urinary retention.

## 2022-05-22 ENCOUNTER — Inpatient Hospital Stay: Payer: No Typology Code available for payment source

## 2022-05-22 DIAGNOSIS — N179 Acute kidney failure, unspecified: Secondary | ICD-10-CM | POA: Diagnosis not present

## 2022-05-22 DIAGNOSIS — I951 Orthostatic hypotension: Secondary | ICD-10-CM | POA: Diagnosis not present

## 2022-05-22 DIAGNOSIS — R062 Wheezing: Secondary | ICD-10-CM

## 2022-05-22 DIAGNOSIS — C61 Malignant neoplasm of prostate: Secondary | ICD-10-CM | POA: Diagnosis not present

## 2022-05-22 DIAGNOSIS — E871 Hypo-osmolality and hyponatremia: Secondary | ICD-10-CM

## 2022-05-22 DIAGNOSIS — R338 Other retention of urine: Secondary | ICD-10-CM | POA: Diagnosis not present

## 2022-05-22 LAB — BASIC METABOLIC PANEL
Anion gap: 6 (ref 5–15)
BUN: 20 mg/dL (ref 8–23)
CO2: 27 mmol/L (ref 22–32)
Calcium: 8.1 mg/dL — ABNORMAL LOW (ref 8.9–10.3)
Chloride: 102 mmol/L (ref 98–111)
Creatinine, Ser: 0.94 mg/dL (ref 0.61–1.24)
GFR, Estimated: >60 mL/min (ref >60)
Glucose, Bld: 103 mg/dL — ABNORMAL HIGH (ref 70–99)
Potassium: 4.1 mmol/L (ref 3.5–5.1)
Sodium: 135 mmol/L (ref 135–145)

## 2022-05-22 LAB — CBC
HCT: 31.5 % — ABNORMAL LOW (ref 39.0–52.0)
Hemoglobin: 10.4 g/dL — ABNORMAL LOW (ref 13.0–17.0)
MCH: 31.4 pg (ref 26.0–34.0)
MCHC: 33 g/dL (ref 30.0–36.0)
MCV: 95.2 fL (ref 80.0–100.0)
Platelets: 202 10*3/uL (ref 150–400)
RBC: 3.31 MIL/uL — ABNORMAL LOW (ref 4.22–5.81)
RDW: 13.3 % (ref 11.5–15.5)
WBC: 9.9 10*3/uL (ref 4.0–10.5)
nRBC: 0 % (ref 0.0–0.2)

## 2022-05-22 MED ORDER — FAMOTIDINE 20 MG PO TABS: 20.0000 mg | ORAL_TABLET | Freq: Every day | ORAL | 0 refills | Status: DC

## 2022-05-22 MED ORDER — GALANTAMINE HYDROBROMIDE ER 16 MG PO CP24: 16.0000 mg | ORAL_CAPSULE | Freq: Every day | ORAL | 0 refills | Status: DC

## 2022-05-22 MED ORDER — ALBUTEROL SULFATE (2.5 MG/3ML) 0.083% IN NEBU: 3.0000 mL | INHALATION_SOLUTION | Freq: Four times a day (QID) | RESPIRATORY_TRACT | Status: DC | PRN

## 2022-05-22 MED ORDER — LOPERAMIDE HCL 2 MG PO CAPS: 2.0000 mg | ORAL_CAPSULE | Freq: Two times a day (BID) | ORAL | 0 refills | Status: DC | PRN

## 2022-05-22 MED ORDER — OXYCODONE HCL 5 MG PO TABS: 2.5000 mg | ORAL_TABLET | Freq: Four times a day (QID) | ORAL | 0 refills | Status: DC | PRN

## 2022-05-22 MED ORDER — ENSURE ENLIVE PO LIQD: 237.0000 mL | Freq: Three times a day (TID) | ORAL | 0 refills | Status: DC

## 2022-05-22 MED ORDER — TRAZODONE HCL 50 MG PO TABS: 50.0000 mg | ORAL_TABLET | Freq: Every day | ORAL | 0 refills | Status: DC

## 2022-05-22 MED ORDER — GABAPENTIN 100 MG PO CAPS: 100.0000 mg | ORAL_CAPSULE | Freq: Every day | ORAL | Status: DC

## 2022-05-22 NOTE — Plan of Care (Signed)
Patient discharged per MD orders at this time.All discharge instructions,education & medications reviewed with the patient.Pt expressed understanding and will comply with the dc instructions. follow up appointments was also communicated to the patient.no verbal c/o or any ssx of distress. Pt was discharged to the [facility] and rehab center for PT/OT services per order.report was called to staff nurse Lequita Halt before transport.Pt was transported by 2 [facility] on a stretcher.

## 2022-05-22 NOTE — Discharge Summary (Signed)
Physician Discharge Summary   Patient: Steven Elliott MRN: [id number] DOB: 02-Dec-1929  Admit date:     05/10/2022  Discharge date: 05/22/22  Discharge Physician: Loletha Grayer   PCP: [name]   Recommendations at discharge:   Follow-up team at rehab 1 day. Follow-up with team at the [facility] in 2 weeks.  (Will need oncology, urology and orthopedic surgery).  Discharge Diagnoses: Principal Problem:   Prostate cancer metastatic to bone [facility]) Active Problems:   Acute urinary retention   AKI (acute kidney injury) ([place])   Balanitis   Orthostatic hypotension   UTI (urinary tract infection)   BPH (benign prostatic hyperplasia)   Diarrhea   Abnormal LFTs   COPD (chronic obstructive pulmonary disease) (HCC)   Chronic kidney disease, stage 3a (HCC)   Dementia ([place])   Urethritis   Wheeze   Hyponatremia    Hospital Course: Patient was admitted 05/10/2022 and discharged on 05/22/2022.  Patient initially came in with diarrhea.  And treated for 5 days with antibiotics for balanitis.  The patient received Firmagon loading dose of 240 mg on 05/19/2022.  Oncology recommends monthly injections of Firmagon or Eligard every 6 months.  Will defer this to oncology as outpatient.  PSA on 05/17/2022 was 29.81.  Patient did have weakness and orthostatic hypotension and acute kidney injury which improved with fluid boluses.  I am hesitant on getting rid of Flomax at this point secondary to acute urinary retention.  Patient will go out to rehab with a Foley and will have to follow-up with urology as outpatient for voiding trial.  Assessment and Plan: * Prostate cancer metastatic to bone [facility]) Metastatic prostate cancer to bone.  Patient given Mills Koller loading dose of 240 mg on 05/19/2022.  Continue Casodex.  Will need Firmagon injections on a monthly basis or Eligard every 6 months.  Acute urinary retention Foley catheter replaced by urology.  Will have to go home with Foley catheter.   Continue Flomax.  AKI (acute kidney injury) ([place]) AKI on CKD stage IIIa.  Creatinine up to 1.38 on 05/21/2022.  Creatinine down to 0.94 on 05/22/2022.  Received fluid boluses on 05/20/2022 and 05/21/2022  Orthostatic hypotension Continue to hold Norvasc.  I am hesitant at this point getting rid of Flomax secondary to acute urinary retention.  Balanitis Balanitis and UTI: Treated during the hospital course.  BPH (benign prostatic hyperplasia) Patient has urinary retention -S/p of Foley catheter.  Patient will need a voiding trial as outpatient with urology team. -Flomax -If unable to remove Foley catheter will have to be changed every month.  UTI (urinary tract infection) Treated previously.  Urine culture actually negative.  Diarrhea Has negative C. difficile and GI panel  Abnormal LFTs AST trending from 152 down to 23 (normal range).  ALT 81 down to 18 (normal range).  Bilirubin normal range.  Alkaline phosphatase 253 on presentation and down to 90 (normal range)  COPD (chronic obstructive pulmonary disease) (HCC) Stable -Bronchodilators  Dementia (HCC) No behavior disturbance -Continue home Namenda, galantamine  Hyponatremia Last sodium in normal range.  Was only one-point less than normal range yesterday.  Wheeze Resolved.  Continue as needed inhaler.         Consultants: Hematology, urology  procedures performed: Chemotherapy, Foley insertion by urology Disposition: Rehabilitation facility Diet recommendation:  Regular diet DISCHARGE MEDICATION: Allergies as of 05/22/2022       Reactions   Procaine Anaphylaxis   Ciprofloxacin Other (See Comments), Nausea And Vomiting   Malaise, Rigor  Hydrocodone    Other reaction(s): Rash, Hives, Heart palpitations ()   Hydrocodone-acetaminophen Itching   Watery eyes   Lisinopril Other (See Comments)   Omeprazole Other (See Comments)   GI reaction   Penicillins Other (See Comments)   Rabeprazole Other (See Comments)    GI reaction        Medication List     STOP taking these medications    amLODipine 5 MG tablet Commonly known as: NORVASC   cephALEXin 500 MG capsule Commonly known as: KEFLEX   clotrimazole 1 % cream Commonly known as: LOTRIMIN   hydrochlorothiazide 25 MG tablet Commonly known as: HYDRODIURIL   trospium 20 MG tablet Commonly known as: SANCTURA       TAKE these medications    acetaminophen 500 MG tablet Commonly known as: TYLENOL Take 500 mg by mouth every 6 (six) hours as needed.   albuterol 108 (90 Base) MCG/ACT inhaler Commonly known as: VENTOLIN HFA Inhale 2 puffs into the lungs every 6 (six) hours as needed for wheezing or shortness of breath.   bicalutamide 50 MG tablet Commonly known as: CASODEX Take 50 mg by mouth daily.   Calcium-D 600-400 MG-UNIT Tabs Take 1 tablet by mouth 2 (two) times daily.   cetirizine 5 MG tablet Commonly known as: ZYRTEC Take 1 tablet (5 mg total) by mouth daily.   cyanocobalamin 500 MCG tablet Commonly known as: VITAMIN B12 Take 1 tablet by mouth daily.   cyclobenzaprine 5 MG tablet Commonly known as: FLEXERIL Take 5 mg by mouth 3 (three) times daily as needed for muscle spasms.   DULoxetine 20 MG capsule Commonly known as: CYMBALTA Take 20 mg by mouth 2 (two) times daily.   famotidine 20 MG tablet Commonly known as: PEPCID Take 1 tablet (20 mg total) by mouth at bedtime.   feeding supplement Liqd Take 237 mLs by mouth 3 (three) times daily between meals.   fluticasone 50 MCG/ACT nasal spray Commonly known as: FLONASE Place 1 spray into both nostrils daily.   gabapentin 100 MG capsule Commonly known as: NEURONTIN Take 1 capsule (100 mg total) by mouth at bedtime. What changed: how much to take   galantamine 16 MG 24 hr capsule Commonly known as: RAZADYNE ER Take 1 capsule (16 mg total) by mouth daily with breakfast. Start taking on: May 23, 2022 What changed:  medication strength how much to  take   ipratropium 0.06 % nasal spray Commonly known as: ATROVENT Place 2 sprays into both nostrils 2 (two) times daily.   ketoconazole 2 % shampoo Commonly known as: NIZORAL SHAMPOO AS DIRECTED AFFECTED AREA THREE TIMES A WEEK AS NEEDED (LEAVE ON AFFECTED AREA FOR 10 MINUTES, THEN RINSE OFF WITH WATER) MAY USE AS A FACE WASH DAILY TO SCALY SPOTS   loperamide 2 MG capsule Commonly known as: IMODIUM Take 1 capsule (2 mg total) by mouth 2 (two) times daily as needed for diarrhea or loose stools.   magnesium oxide 400 (240 Mg) MG tablet Commonly known as: MAG-OX Take 400 mg by mouth daily.   memantine 10 MG tablet Commonly known as: NAMENDA Take 10 mg by mouth 2 (two) times daily.   multivitamin with minerals Tabs tablet Take 1 tablet by mouth daily.   oxyCODONE 5 MG immediate release tablet Commonly known as: Oxy IR/ROXICODONE Take 0.5 tablets (2.5 mg total) by mouth every 6 (six) hours as needed for severe pain.   tamsulosin 0.4 MG Caps capsule Commonly known as: FLOMAX Take 0.4 mg  by mouth at bedtime.   traZODone 50 MG tablet Commonly known as: DESYREL Take 1 tablet (50 mg total) by mouth at bedtime. What changed: See the new instructions.        Follow-up Information     Doctor at rehab Follow up in 1 day(s).          [facility] Follow up in 2 week(s).   Contact information: [place] [address] [phone]                Discharge Exam: Danley Danker Weights   05/15/22 0914  Weight: 70.7 kg   Physical Exam HENT:     Head: Normocephalic.     Mouth/Throat:     Pharynx: No oropharyngeal exudate.  Eyes:     General: Lids are normal.     Conjunctiva/sclera: Conjunctivae normal.  Cardiovascular:     Rate and Rhythm: Normal rate and regular rhythm.     Heart sounds: Normal heart sounds, S1 normal and S2 normal.  Pulmonary:     Breath sounds: No transmitted upper airway sounds. No decreased breath  sounds, wheezing, rhonchi or rales.  Abdominal:     Palpations: Abdomen is soft.     Tenderness: There is no abdominal tenderness.  Musculoskeletal:     Right lower leg: No swelling.     Left lower leg: No swelling.     Comments: Pain with range of motion right knee.  Skin:    General: Skin is warm.     Findings: No rash.  Neurological:     Mental Status: He is alert.     Comments: Answers questions appropriately      Condition at discharge: stable  The results of significant diagnostics from this hospitalization (including imaging, microbiology, ancillary and laboratory) are listed below for reference.   Imaging Studies: DG Chest Port 1 View  Result Date: 05/22/2022 CLINICAL DATA:  Diarrhea EXAM: PORTABLE CHEST 1 VIEW COMPARISON:  Chest x-ray dated September 13, 2020 FINDINGS: Cardiac and mediastinal contours are within normal limits. Background emphysema. Irregular linear opacities of the right upper lobe and left lower lobe, similar to prior and likely due to scarring. No focal consolidation. IMPRESSION: Background emphysema and scarring. No evidence of acute airspace opacity. Electronically Signed   By: Yetta Glassman M.D.   On: 05/22/2022 08:30   CT ABDOMEN PELVIS W CONTRAST  Result Date: 05/10/2022 CLINICAL DATA:  Abdominal pain, diarrhea for 3 days, antibiotic treatment for penile infection around urinary catheter * Tracking Code: BO * EXAM: CT ABDOMEN AND PELVIS WITH CONTRAST TECHNIQUE: Multidetector CT imaging of the abdomen and pelvis was performed using the standard protocol following bolus administration of intravenous contrast. RADIATION DOSE REDUCTION: This exam was performed according to the departmental dose-optimization program which includes automated exposure control, adjustment of the mA and/or kV according to patient size and/or use of iterative reconstruction technique. CONTRAST:  36m OMNIPAQUE IOHEXOL 300 MG/ML  SOLN COMPARISON:  04/23/2022 FINDINGS: Lower chest:  No acute abnormality. Severe emphysema. Three-vessel coronary artery calcifications. Small hiatal hernia. Hepatobiliary: No focal liver abnormality is seen. Status post cholecystectomy. Postop biliary dilatation. Pancreas: Unremarkable. No pancreatic ductal dilatation or surrounding inflammatory changes. Spleen: Normal in size without significant abnormality. Adrenals/Urinary Tract: Adrenal glands are unremarkable. Mild right hydronephrosis and hydroureter, similar to prior examination. No left-sided hydronephrosis. Severely thickened urinary bladder wall. Foley catheter in the bladder. Stomach/Bowel: Stomach is within normal limits. Diverticulum of the descending duodenum. Appendix is not clearly visualized.  No evidence of bowel wall thickening, distention, or inflammatory changes. Interval resolution of previously seen pneumatosis of the proximal to mid small bowel. Descending and sigmoid diverticulosis. Colon is fluid-filled to the rectum. Vascular/Lymphatic: Aortic atherosclerosis. Infrarenal abdominal aortic aneurysm status post aortobiiliac stent endograft repair, unchanged. Enlarged, necrotic appearing perirectal right pelvic sidewall, and right iliac lymph nodes, largest right iliac node measuring 2.8 x 2.6 cm (series 3, image 64). Reproductive: Severe prostatomegaly. Extensive, nodular tissue about the prostate, adjacent bladder base, and rectum (series 3, image 69). Other: Large, fat containing bilateral inguinal hernias, partially imaged. No ascites. Musculoskeletal: No acute or significant osseous findings. IMPRESSION: 1. Severe prostatomegaly with extensive, nodular tissue about the prostate, adjacent bladder base, and rectum. 2. Enlarged, necrotic appearing perirectal, right pelvic sidewall, and right iliac lymph nodes. 3. Findings are most consistent with locally advanced prostate or rectal malignancy with direct involvement of adjacent organs and nodal metastatic disease. 4. Severely thickened  urinary bladder wall with Foley catheter. 5. Mild right hydronephrosis and hydroureter, similar to prior examination, presumably secondary to obstruction of the distal right ureter by pelvic malignancy. 6. Interval resolution of previously seen pneumatosis of the proximal to mid small bowel. 7. Fluid-filled colon, in keeping with diarrheal illness. 8. Severe emphysema. 9. Coronary artery disease. Aortic Atherosclerosis (ICD10-I70.0) and Emphysema (ICD10-J43.9). Electronically Signed   By: Delanna Ahmadi M.D.   On: 05/10/2022 13:42   CT ABDOMEN PELVIS WO CONTRAST  Result Date: 04/23/2022 CLINICAL DATA:  Diarrhea and bloody stool for several months. EXAM: CT ABDOMEN AND PELVIS WITHOUT CONTRAST TECHNIQUE: Multidetector CT imaging of the abdomen and pelvis was performed following the standard protocol without IV contrast. RADIATION DOSE REDUCTION: This exam was performed according to the departmental dose-optimization program which includes automated exposure control, adjustment of the mA and/or kV according to patient size and/or use of iterative reconstruction technique. COMPARISON:  March 20, 2016 FINDINGS: Lower chest: Emphysematous lung disease is seen within the bilateral lung bases. Hepatobiliary: An 11 mm cystic appearing focus is seen within the anterior aspect of the right lobe of the liver. Status post cholecystectomy. No biliary dilatation. Pancreas: Unremarkable. No pancreatic ductal dilatation or surrounding inflammatory changes. Spleen: Normal in size without focal abnormality. Adrenals/Urinary Tract: Adrenal glands are unremarkable. Kidneys are normal in size, without focal lesions. There is bilateral moderate to marked severity hydronephrosis and hydroureter, right greater than left. No obstructing renal calculi are identified. The urinary bladder is moderately distended, with asymmetric bladder wall thickening seen along the inferolateral aspect of the urinary bladder on the left (axial CT images 59  through 63). Stomach/Bowel: Stomach is within normal limits. The appendix is not visualized. No evidence of bowel dilatation. Numerous diverticula are seen within the descending and sigmoid colon. A large amount of peri-intestinal free air is seen, most prominent within the mid and upper abdomen. Vascular/Lymphatic: Stent graft repair of an infrarenal abdominal aortic aneurysm is noted. A 3.6 cm x 2.9 cm lymph node is seen along the posterior right iliac chain (axial CT image 54, CT series 2). Reproductive: The prostate gland is markedly enlarged. Mass effect is seen along the base of the urinary bladder. The posterior border of the prostate gland is poorly defined, with heterogeneous soft tissue extending into the posterior aspect of the adjacent portion of the pelvis (axial CT images 56 through 65, CT series 2). The seminal vesicles are also enlarged and heterogeneous in appearance. Other: There is an 8.4 cm x 4.6 cm fat containing right inguinal hernia.  A 6.4 cm x 5.0 cm left inguinal hernia is also noted. This contains fat and a small amount of fluid. A mild amount of inflammatory fat stranding is also seen. No abdominopelvic ascites Musculoskeletal: Multilevel degenerative changes are seen throughout the lumbar spine. IMPRESSION: 1. Large amount of peri-intestinal free air, most prominent within the mid and upper abdomen, consistent with bowel perforation. 2. Markedly enlarged prostate gland with additional findings concerning for prostate cancer. Correlation with PSA levels is recommended. 3. Asymmetric urinary bladder wall thickening, as described above, which is also concerning for an underlying neoplastic process. Further evaluation with urinalysis is recommended to exclude acute cystitis. 4. Colonic diverticulosis. 5. Bilateral fat-containing inguinal hernias, as described above. 6. Infrarenal abdominal aortic aneurysm stent graft placement. 7. Emphysematous lung disease. 8. Evidence of prior  cholecystectomy. Emphysema (ICD10-J43.9). Electronically Signed   By: Virgina Norfolk M.D.   On: 04/23/2022 17:45    Microbiology: Results for orders placed or performed during the hospital encounter of 05/10/22  Urine Culture     Status: None   Collection Time: 05/10/22 12:34 PM   Specimen: Urine, Clean Catch  Result Value Ref Range Status   Specimen Description   Final    URINE, CLEAN CATCH Performed at [facility], [address] [place], [place] [address]    Special Requests   Final    NONE Performed at [facility], [address], [place], [place] [address]    Culture   Final    NO GROWTH Performed at [facility], [place] [address], [place], [place] [address]    Report Status 05/11/2022 FINAL  Final  Gastrointestinal Panel by PCR , Stool     Status: None   Collection Time: 05/10/22 12:34 PM   Specimen: Stool  Result Value Ref Range Status   Campylobacter species NOT DETECTED NOT DETECTED Final   Plesimonas shigelloides NOT DETECTED NOT DETECTED Final   Salmonella species NOT DETECTED NOT DETECTED Final   Yersinia enterocolitica NOT DETECTED NOT DETECTED Final   Vibrio species NOT DETECTED NOT DETECTED Final   Vibrio cholerae NOT DETECTED NOT DETECTED Final   Enteroaggregative E coli (EAEC) NOT DETECTED NOT DETECTED Final   Enteropathogenic E coli (EPEC) NOT DETECTED NOT DETECTED Final   Enterotoxigenic E coli (ETEC) NOT DETECTED NOT DETECTED Final   Shiga like toxin producing E coli (STEC) NOT DETECTED NOT DETECTED Final   Shigella/Enteroinvasive E coli (EIEC) NOT DETECTED NOT DETECTED Final   Cryptosporidium NOT DETECTED NOT DETECTED Final   Cyclospora cayetanensis NOT DETECTED NOT DETECTED Final   Entamoeba histolytica NOT DETECTED NOT DETECTED Final   Giardia lamblia NOT DETECTED NOT DETECTED Final   Adenovirus F40/41 NOT DETECTED NOT DETECTED Final   Astrovirus NOT DETECTED NOT DETECTED Final   Norovirus GI/GII NOT DETECTED NOT DETECTED  Final   Rotavirus A NOT DETECTED NOT DETECTED Final   Sapovirus (I, II, IV, and V) NOT DETECTED NOT DETECTED Final    Comment: Performed at [facility], [address] [place], [place] [address]  C Difficile Quick Screen w PCR reflex     Status: None   Collection Time: 05/10/22 12:34 PM   Specimen: STOOL  Result Value Ref Range Status   C Diff antigen NEGATIVE NEGATIVE Final   C Diff toxin NEGATIVE NEGATIVE Final   C Diff interpretation No C. difficile detected.  Final    Comment: Performed at [facility], [address] [place], [place] [address]  Culture,  routine-genital     Status: None   Collection Time: 05/11/22  2:47 PM   Specimen: Urethra  Result Value Ref Range Status   Specimen Description   Final    URETHRA Performed at [facility], [address] [place], [place] [address]    Special Requests   Final    NONE Performed at [facility], [address] [place], [place] [address]    Culture   Final    FEW Multiple bacterial morphotypes present, none predominant. Suggest appropriate recollection if clinically indicated. NO STAPHYLOCOCCUS AUREUS ISOLATED NO GROUP A STREP (S.PYOGENES) ISOLATED NO GROUP B STREP (S.AGALACTIAE) ISOLATED Performed at [facility], [place] [address], [place], [place] [address]    Report Status 05/14/2022 FINAL  Final    Labs: CBC: Recent Labs  Lab 05/16/22 0741 05/16/22 1349 05/17/22 0501 05/18/22 0826 05/19/22 0647 05/21/22 0341 05/22/22 0732  WBC 8.3  --  7.0 7.7 16.0* 16.6* 9.9  NEUTROABS 5.9  --  4.8 5.4 13.6*  --   --   HGB 10.5*   < > 10.8* 11.5* 12.6* 11.3* 10.4*  HCT 32.3*  --  32.2* 34.7* 37.9* 33.9* 31.5*  MCV 96.7  --  95.0 95.6 96.4 96.9 95.2  PLT 247  --  252 260 269 208 202   < > = values in this interval not displayed.   Basic Metabolic Panel: Recent Labs  Lab 05/18/22 0826 05/19/22 0647 05/20/22 0350 05/21/22 0341 05/22/22 0732  NA 139 138 134* 134* 135  K  3.7 4.2 4.1 4.7 4.1  CL 106 104 100 100 102  CO2 '27 29 26 28 27  '$ GLUCOSE 96 108* 126* 116* 103*  BUN '9 14 12 23 20  '$ CREATININE 0.66 0.90 0.85 1.38* 0.94  CALCIUM 8.9 9.4 8.9 8.4* 8.1*   Liver Function Tests: Recent Labs  Lab 05/21/22 0341  AST 23  ALT 18  ALKPHOS 90  BILITOT 1.0  PROT 6.9  ALBUMIN 2.9*     Discharge time spent: greater than 30 minutes.  Signed: Loletha Grayer, MD [facility] 05/22/2022

## 2022-05-22 NOTE — Progress Notes (Addendum)
Occupational Therapy Treatment Patient Details Name: Steven Elliott MRN: [id number] DOB: 10-Jan-1930 Today's Date: 05/22/2022   History of present illness Pt is a 88 y.o. male presenting to hospital 8/20 with c/o diarrhea and penile pain.  Recent treatment for balanitis.  Developed gross hematuria during hospitalization (8/22).  Pt admitted with balanitis, UTI, htn, diarrhea, and abnormal LFT's.  PMH includes prostate CA with urinary retention (with indwelling foley), known bone metastases, COPD, htn, diverticular bleeding, dementia, CKD.   OT comments  Chart reviewed to date, nurse cleared pt for participation in OT tx session. Tx session targeted improving endurance and activity tolerance in the setting of ADL tasks/functional mobility. Increased assist required today for all functional mobility,  amb from bathroom, requiring MIN A at end of amb from bathroom with RW due to fatigue, MIN A for STS off 3 in 1 commode. Discussion with daughter and pt re: decrease in functional status. Pt wants to return home with home therapy, family concerned re: current level of assist needed. At this point, discharge recommendation updated to STR. Pt is left in bedside chair, NAD, all needs met.   Of note: BP 139/53 (MAP 75) HR 85 following amb to and from bathroom with RW ,spo2 and HR monitored at rest and with activity HR up to 110 with activity, spo2 >89% on RA throughout.    Recommendations for follow up therapy are one component of a multi-disciplinary discharge planning process, led by the attending physician.  Recommendations may be updated based on patient status, additional functional criteria and insurance authorization.    Follow Up Recommendations  Skilled nursing-short term rehab (<3 hours/day)    Assistance Recommended at Discharge Frequent or constant Supervision/Assistance  Patient can return home with the following  A little help with walking and/or transfers;A little help with  bathing/dressing/bathroom;Assistance with cooking/housework;Assist for transportation;Help with stairs or ramp for entrance;Direct supervision/assist for medications management   Equipment Recommendations  Other (comment) (defer)    Recommendations for Other Services      Precautions / Restrictions Precautions Precautions: Fall Restrictions Weight Bearing Restrictions: No       Mobility Bed Mobility Overal bed mobility: Needs Assistance Bed Mobility: Supine to Sit     Supine to sit: Mod assist, HOB elevated          Transfers Overall transfer level: Needs assistance Equipment used: Rolling walker (2 wheels) Transfers: Sit to/from Stand Sit to Stand: Min assist, Min guard                 Balance Overall balance assessment: Needs assistance Sitting-balance support: No upper extremity supported, Feet supported Sitting balance-Leahy Scale: Fair     Standing balance support: Reliant on assistive device for balance, During functional activity, Bilateral upper extremity supported Standing balance-Leahy Scale: Fair                             ADL either performed or assessed with clinical judgement   ADL Overall ADL's : Needs assistance/impaired Eating/Feeding: Sitting;Set up                       Toilet Transfer: Min guard;Minimal assistance;BSC/3in1;Ambulation;Rolling walker (2 wheels) Toilet Transfer Details (indicate cue type and reason): CGA for stand>sit, MIN A for STS Toileting- Clothing Manipulation and Hygiene: Min guard       Functional mobility during ADLs: Min guard;Minimal assistance (approx 15' 2 attempts with CGA with RW, MIN A  towards end of amb second attempt due to fatigue;)      Extremity/Trunk Assessment              Vision       Perception     Praxis      Cognition Arousal/Alertness: Awake/alert Behavior During Therapy: WFL for tasks assessed/performed Overall Cognitive Status: Within Functional Limits  for tasks assessed                                          Exercises      Shoulder Instructions       General Comments      Pertinent Vitals/ Pain       Pain Assessment Pain Assessment: No/denies pain  Home Living                                          Prior Functioning/Environment              Frequency  Min 2X/week        Progress Toward Goals  OT Goals(current goals can now be found in the care plan section)  Progress towards OT goals: Progressing toward goals     Plan Discharge plan needs to be updated    Co-evaluation                 AM-PAC OT "6 Clicks" Daily Activity     Outcome Measure   Help from another person eating meals?: None Help from another person taking care of personal grooming?: A Little Help from another person toileting, which includes using toliet, bedpan, or urinal?: A Little Help from another person bathing (including washing, rinsing, drying)?: A Little Help from another person to put on and taking off regular upper body clothing?: A Little Help from another person to put on and taking off regular lower body clothing?: A Lot 6 Click Score: 18    End of Session Equipment Utilized During Treatment: Rolling walker (2 wheels)  OT Visit Diagnosis: Other abnormalities of gait and mobility (R26.89);Muscle weakness (generalized) (M62.81);History of falling (Z91.81)   Activity Tolerance Patient tolerated treatment well   Patient Left in chair;with call bell/phone within reach;with chair alarm set;with family/visitor present   Nurse Communication Mobility status        Time: 2707-8675 OT Time Calculation (min): 26 min  Charges: OT General Charges $OT Visit: 1 Visit OT Treatments $Self Care/Home Management : 23-37 mins  Shanon Payor, OTD OTR/L  05/22/22, 11:30 AM

## 2022-05-22 NOTE — TOC Transition Note (Signed)
Transition of Care [facility]) - CM/SW Discharge Note   Patient Details  Name: Steven Elliott MRN: [id number] Date of Birth: 1930-01-21  [facility] CM/SW Contact:  Coralee Pesa, [place] Phone Number: 05/22/2022, 1:52 PM   Clinical Narrative:    Pt to be transported to [facility] via [employer]. Nurse to call report to [phone] Rm# [id number]   Final next level of care: Skilled Nursing Facility Barriers to Discharge: Barriers Resolved   Patient Goals and CMS Choice Patient states their goals for this hospitalization and ongoing recovery are:: Pt not able to participate in goal setting at this time. CMS Medicare.gov Compare Post Acute Care list provided to:: Patient Represenative (must comment) Choice offered to / list presented to : Adult Children  Discharge Placement              Patient [facility] at[facility] Patient to be transferred to facility by: [facility] Name of family member notified: Daughters Patient and family notified of of transfer: 05/22/22  Discharge Plan and Services   Discharge Planning Services: CM Consult Post Acute Care Choice: [place]          DME Arranged: N/A         HH Arranged: PT, RN [place] Agency: [place] Date HH Agency Contacted: 05/18/22 Time [place]: 1224 Representative spoke with at [place]: Corene Cornea  Social Determinants of Health (Rule) Interventions     Readmission Risk Interventions     No data to display

## 2022-06-03 ENCOUNTER — Telehealth: Payer: Self-pay | Admitting: Urology

## 2022-06-03 NOTE — Telephone Encounter (Signed)
Pt daughter called asking why his appt was cancelled.  Please call  [name] [phone]

## 2022-06-03 NOTE — Telephone Encounter (Signed)
Steven Elliott called back to confirm pt will be at appt tomorrow at 11:30 to see [name].

## 2022-06-03 NOTE — Telephone Encounter (Signed)
Per [place] So, pt's daughter is on the phone, she said they do NOT want to see Dr Bernardo Heater and pt is in a rehab facility and dr there wants him to see a urologist.    They want to keep the 10:00 tomorrow w/[place], if possible   Per Dr. Diamantina [facility]  He really needs to be seen at the [facility] where his prostate cancer is managed   Spoke with Dr. Bernardo Heater ok for pt to be seen by PA  Left message to put pt on Sams schedule tomorrow at 9am or 11:30

## 2022-06-04 ENCOUNTER — Ambulatory Visit (INDEPENDENT_AMBULATORY_CARE_PROVIDER_SITE_OTHER): Payer: No Typology Code available for payment source | Admitting: Physician Assistant

## 2022-06-04 ENCOUNTER — Ambulatory Visit: Payer: No Typology Code available for payment source | Admitting: Urology

## 2022-06-04 ENCOUNTER — Encounter: Payer: Self-pay | Admitting: Physician Assistant

## 2022-06-04 ENCOUNTER — Telehealth: Payer: Self-pay | Admitting: Physician Assistant

## 2022-06-04 VITALS — BP 120/71 | HR 89 | Ht 68.0 in | Wt 158.0 lb

## 2022-06-04 DIAGNOSIS — C61 Malignant neoplasm of prostate: Secondary | ICD-10-CM

## 2022-06-04 DIAGNOSIS — R339 Retention of urine, unspecified: Secondary | ICD-10-CM | POA: Diagnosis not present

## 2022-06-04 DIAGNOSIS — R310 Gross hematuria: Secondary | ICD-10-CM | POA: Diagnosis not present

## 2022-06-04 DIAGNOSIS — L293 Anogenital pruritus, unspecified: Secondary | ICD-10-CM

## 2022-06-04 DIAGNOSIS — C7951 Secondary malignant neoplasm of bone: Secondary | ICD-10-CM

## 2022-06-04 DIAGNOSIS — R8271 Bacteriuria: Secondary | ICD-10-CM

## 2022-06-04 LAB — BLADDER SCAN AMB NON-IMAGING: Scan Result: 0 mL

## 2022-06-04 MED ORDER — CLOTRIMAZOLE 1 % EX CREA: 1.0000 | TOPICAL_CREAM | Freq: Two times a day (BID) | CUTANEOUS | 1 refills | Status: DC

## 2022-06-04 NOTE — Telephone Encounter (Signed)
Called Greta back, LMOM to return my call.

## 2022-06-04 NOTE — Telephone Encounter (Signed)
Steven Elliott with the [facility] called to discuss patient. She would like someone to call her back to clear up miscommunication from relative, and if there is anything needed from [name]. She stated she had been on a 3 way call that got disconnected. Her phone number is [phone]

## 2022-06-04 NOTE — Progress Notes (Signed)
06/04/2022 5:03 PM   Steven Elliott Mar 11, 1930 [id number]  CC: Chief Complaint  Patient presents with   Follow-up    Discuss hospital follow-up   HPI: Steven Elliott is a 88 y.o. male with metastatic prostate cancer diagnosed at the [facility] recently admitted with gross hematuria, penile swelling/possible balanitis, urinary retention who failed an inpatient voiding trial, and who received a loading dose of Firmagon while inpatient who presents today for evaluation of gross hematuria.  He is accompanied today by his 2 daughters, who contribute to HPI.  He was discharged to [facility], where he remains.  Today he reports gross hematuria with output of thick, dark red urine with clots 2 days ago that has gotten slightly better since.  He states he feels well today and denies chest pain, shortness of breath, and fatigue.  Foley catheter in place today draining dark red, runny urine without clots.  He was treated for a UTI by [facility] staff with 3 days of antibiotics due to cloudy, dark appearing urine.  He denies low belly pain, low back pain, fever, chills, nausea, or vomiting today.  He does report some genital itching.  [name] reports that she made a follow-up appointment at the [facility] for him on September 29 for follow-up of his prostate cancer because this location is closer to him.  PMH: Past Medical History:  Diagnosis Date   Allergic rhinitis    Aneurysm of infrarenal abdominal aorta ([place]) 02/2015   5.4 cm by CT 03/20/16.    Anosmia    BPH (benign prostatic hyperplasia)    COPD (chronic obstructive pulmonary disease) (HCC)    Diverticulosis    Emphysema (subcutaneous) (surgical) resulting from a procedure    severe by CT 03/20/16   GERD (gastroesophageal reflux disease)    History of bilateral inguinal hernias 02/2015   Hypertension    Peripheral vascular disease ([place]) 02/2015   heavily calcified but patent bil iliac arteries by CTA  03/20/16.    Surgical History: Past Surgical History:  Procedure Laterality Date   APPENDECTOMY     CHOLECYSTECTOMY     COLONOSCOPY  ~ 2007   at [place] Left 2014    Home Medications:  Allergies as of 06/04/2022       Reactions   Procaine Anaphylaxis   Ciprofloxacin Other (See Comments), Nausea And Vomiting   Malaise, Rigor   Hydrocodone    Other reaction(s): Rash, Hives, Heart palpitations ()   Hydrocodone-acetaminophen Itching   Watery eyes   Lisinopril Other (See Comments)   Omeprazole Other (See Comments)   GI reaction   Penicillins Other (See Comments)   Rabeprazole Other (See Comments)   GI reaction        Medication List        Accurate as of June 04, 2022  5:03 PM. If you have any questions, ask your nurse or doctor.          acetaminophen 500 MG tablet Commonly known as: TYLENOL Take 500 mg by mouth every 6 (six) hours as needed.   albuterol 108 (90 Base) MCG/ACT inhaler Commonly known as: VENTOLIN HFA Inhale 2 puffs into the lungs every 6 (six) hours as needed for wheezing or shortness of breath.   bicalutamide 50 MG tablet Commonly known as: CASODEX Take 50 mg by mouth daily.   Calcium-D 600-400 MG-UNIT Tabs Take 1 tablet by mouth 2 (  two) times daily.   cetirizine 5 MG tablet Commonly known as: ZYRTEC Take 1 tablet (5 mg total) by mouth daily.   clotrimazole 1 % cream Commonly known as: LOTRIMIN Apply 1 Application topically 2 (two) times daily. Started by: Debroah Loop, PA-C   cyanocobalamin 500 MCG tablet Commonly known as: VITAMIN B12 Take 1 tablet by mouth daily.   cyclobenzaprine 5 MG tablet Commonly known as: FLEXERIL Take 5 mg by mouth 3 (three) times daily as needed for muscle spasms.   DULoxetine 20 MG capsule Commonly known as: CYMBALTA Take 20 mg by mouth 2 (two) times daily.   famotidine 20 MG tablet Commonly known as: PEPCID Take 1 tablet (20 mg  total) by mouth at bedtime.   feeding supplement Liqd Take 237 mLs by mouth 3 (three) times daily between meals.   fluticasone 50 MCG/ACT nasal spray Commonly known as: FLONASE Place 1 spray into both nostrils daily.   gabapentin 100 MG capsule Commonly known as: NEURONTIN Take 1 capsule (100 mg total) by mouth at bedtime.   galantamine 16 MG 24 hr capsule Commonly known as: RAZADYNE ER Take 1 capsule (16 mg total) by mouth daily with breakfast.   ipratropium 0.06 % nasal spray Commonly known as: ATROVENT Place 2 sprays into both nostrils 2 (two) times daily.   ketoconazole 2 % shampoo Commonly known as: NIZORAL SHAMPOO AS DIRECTED AFFECTED AREA THREE TIMES A WEEK AS NEEDED (LEAVE ON AFFECTED AREA FOR 10 MINUTES, THEN RINSE OFF WITH WATER) MAY USE AS A FACE WASH DAILY TO SCALY SPOTS   loperamide 2 MG capsule Commonly known as: IMODIUM Take 1 capsule (2 mg total) by mouth 2 (two) times daily as needed for diarrhea or loose stools.   magnesium oxide 400 (240 Mg) MG tablet Commonly known as: MAG-OX Take 400 mg by mouth daily.   memantine 10 MG tablet Commonly known as: NAMENDA Take 10 mg by mouth 2 (two) times daily.   multivitamin with minerals Tabs tablet Take 1 tablet by mouth daily.   oxyCODONE 5 MG immediate release tablet Commonly known as: Oxy IR/ROXICODONE Take 0.5 tablets (2.5 mg total) by mouth every 6 (six) hours as needed for severe pain.   tamsulosin 0.4 MG Caps capsule Commonly known as: FLOMAX Take 0.4 mg by mouth at bedtime.   traZODone 50 MG tablet Commonly known as: DESYREL Take 1 tablet (50 mg total) by mouth at bedtime.        Allergies:  Allergies  Allergen Reactions   Procaine Anaphylaxis   Ciprofloxacin Other (See Comments) and Nausea And Vomiting    Malaise, Rigor   Hydrocodone     Other reaction(s): Rash, Hives, Heart palpitations ()   Hydrocodone-Acetaminophen Itching    Watery eyes   Lisinopril Other (See Comments)    Omeprazole Other (See Comments)    GI reaction   Penicillins Other (See Comments)   Rabeprazole Other (See Comments)    GI reaction    Family History: Family History  Problem Relation Age of Onset   Breast cancer Mother    Pancreatitis Mother    Breast cancer Sister     Social History:   reports that he has quit smoking. His smoking use included cigarettes and cigars. He has never used smokeless tobacco. He reports current alcohol use. He reports that he does not use drugs.  Physical Exam: BP 120/71   Pulse 89   Ht '5\' 8"'$  (1.727 m)   Wt 158 lb (71.7 kg)  BMI 24.02 kg/m   Constitutional:  Alert and oriented, no acute distress, nontoxic appearing HEENT: [place], AT Cardiovascular: No clubbing, cyanosis, or edema Respiratory: Normal respiratory effort, no increased work of breathing GU: Slightly improved penile edema compared to prior Skin: No rashes, bruises or suspicious lesions Neurologic: Grossly intact, no focal deficits, moving all 4 extremities Psychiatric: Normal mood and affect  Laboratory Data: Results for orders placed or performed in visit on 06/04/22  BLADDER SCAN AMB NON-IMAGING  Result Value Ref Range   Scan Result 0 mL   Bladder Irrigation  Due to gross hematuria patient is present today for a bladder irrigation. Patient was cleaned and prepped in a sterile fashion. 500 mL of sterile water was instilled into the bladder with a 80m Toomey syringe through the catheter in place.  5077mof urine return was cleared from the bladder with evacuation of 5ccs of clot material. Efflux cleared from dark red to pink tinged with the procedure and the catheter irrigated easily. Upon completion, the catheter was draining well and was reattached to the night bag for drainage. Patient tolerated well.   Performed by: SaDebroah LoopPA-C   Assessment & Plan:   1. Hematuria, gross I irrigated his Foley catheter in clinic today and efflux quickly cleared to pink-tinged  with removal of approximately 5 cc of clot material from the bladder.  No indication for CBI at this time and he is not symptomatic of blood loss anemia.    Unfortunately, he is going to be at increased risk for recurrent hematuria with his metastatic prostate cancer.  I am hopeful that his episodes of bleeding will decrease in severity and frequency as he remains on ADT.  I continue to be available to manage acute Foley catheter problems, but otherwise [facility] - BLADDER SCAN AMB NON-IMAGING  2. Asymptomatic bacteriuria He is not clinically infected today and it does not sound like he was having any infective symptoms at the time that [facility] discussed the colonization with a chronic indwelling Foley catheter is incredibly common and that antibiotics for management of the appearance of urine alone is inappropriate.  Additionally, if he had been clinically infected, 3 days of antibiotics is likely insufficient to clear an infection in this catheterized, male patient.  3. Prostate [facility] to [facility] I encouraged them to keep this appointment.  I had a very lengthy and frank conversation with him today regarding my concerns about the redundancy of him [facility] [place].  I am concerned that this redundancy could lead to continuity errors including possible missed doses of ADT with worsened patient outcomes.  For that reason, I think that [facility] be [facility] both his [facility] cancer and his urinary retention as below.  They expressed understanding.  4. Urinary retention Keep Foley catheter in place.  As above, I urged them to schedule voiding trials [facility].  We will not be performing voiding trials in our clinic.  The team that is managing his prostate cancer  should be managing his urinary retention also.  5. Itching of male genitalia Prescribing topical clotrimazole for possible yeast infection in the setting of recent and appropriate antibiotics as above. - clotrimazole (LOTRIMIN) 1 % cream; Apply 1 Application topically 2 (two) times daily.  Dispense: 28 g; Refill: 1   Return if symptoms worsen or fail to improve.  Debroah Loop, PA-C  [facility] [address] [place], [place] [address] [phone]

## 2022-07-20 ENCOUNTER — Emergency Department
Admission: EM | Admit: 2022-07-20 | Discharge: 2022-07-20 | Disposition: A | Discharge status: Home / Self Care | Payer: No Typology Code available for payment source | Attending: Emergency Medicine | Admitting: Emergency Medicine

## 2022-07-20 ENCOUNTER — Other Ambulatory Visit: Payer: Self-pay

## 2022-07-20 DIAGNOSIS — F0390 Unspecified dementia without behavioral disturbance: Secondary | ICD-10-CM | POA: Diagnosis not present

## 2022-07-20 DIAGNOSIS — N390 Urinary tract infection, site not specified: Secondary | ICD-10-CM

## 2022-07-20 DIAGNOSIS — Z7951 Long term (current) use of inhaled steroids: Secondary | ICD-10-CM | POA: Insufficient documentation

## 2022-07-20 DIAGNOSIS — T83091A Other mechanical complication of indwelling urethral catheter, initial encounter: Secondary | ICD-10-CM

## 2022-07-20 DIAGNOSIS — I10 Essential (primary) hypertension: Secondary | ICD-10-CM | POA: Insufficient documentation

## 2022-07-20 DIAGNOSIS — J449 Chronic obstructive pulmonary disease, unspecified: Secondary | ICD-10-CM | POA: Insufficient documentation

## 2022-07-20 LAB — COMPREHENSIVE METABOLIC PANEL
ALT: 15 U/L (ref 0–44)
AST: 28 U/L (ref 15–41)
Albumin: 3.6 g/dL (ref 3.5–5.0)
Alkaline Phosphatase: 88 U/L (ref 38–126)
Anion gap: 9 (ref 5–15)
BUN: 19 mg/dL (ref 8–23)
CO2: 23 mmol/L (ref 22–32)
Calcium: 8.8 mg/dL — ABNORMAL LOW (ref 8.9–10.3)
Chloride: 105 mmol/L (ref 98–111)
Creatinine, Ser: 1.03 mg/dL (ref 0.61–1.24)
GFR, Estimated: >60 mL/min (ref >60)
Glucose, Bld: 135 mg/dL — ABNORMAL HIGH (ref 70–99)
Potassium: 3.9 mmol/L (ref 3.5–5.1)
Sodium: 137 mmol/L (ref 135–145)
Total Bilirubin: 0.7 mg/dL (ref 0.3–1.2)
Total Protein: 8.3 g/dL — ABNORMAL HIGH (ref 6.5–8.1)

## 2022-07-20 LAB — CBC WITH DIFFERENTIAL/PLATELET
Abs Immature Granulocytes: 0.09 10*3/uL — ABNORMAL HIGH (ref 0.00–0.07)
Basophils Absolute: 0 10*3/uL (ref 0.0–0.1)
Basophils Relative: 0 %
Eosinophils Absolute: 0.1 10*3/uL (ref 0.0–0.5)
Eosinophils Relative: 1 %
HCT: 39.3 % (ref 39.0–52.0)
Hemoglobin: 12.9 g/dL — ABNORMAL LOW (ref 13.0–17.0)
Immature Granulocytes: 1 %
Lymphocytes Relative: 8 %
Lymphs Abs: 0.9 10*3/uL (ref 0.7–4.0)
MCH: 30.9 pg (ref 26.0–34.0)
MCHC: 32.8 g/dL (ref 30.0–36.0)
MCV: 94 fL (ref 80.0–100.0)
Monocytes Absolute: 0.6 10*3/uL (ref 0.1–1.0)
Monocytes Relative: 6 %
Neutro Abs: 9.2 10*3/uL — ABNORMAL HIGH (ref 1.7–7.7)
Neutrophils Relative %: 84 %
Platelets: 207 10*3/uL (ref 150–400)
RBC: 4.18 MIL/uL — ABNORMAL LOW (ref 4.22–5.81)
RDW: 13.2 % (ref 11.5–15.5)
WBC: 11 10*3/uL — ABNORMAL HIGH (ref 4.0–10.5)
nRBC: 0 % (ref 0.0–0.2)

## 2022-07-20 LAB — URINALYSIS, ROUTINE W REFLEX MICROSCOPIC
Bilirubin Urine: NEGATIVE
Glucose, UA: NEGATIVE mg/dL
Ketones, ur: NEGATIVE mg/dL
Nitrite: NEGATIVE
Protein, ur: 100 mg/dL — AB
RBC / HPF: >50 RBC/hpf — ABNORMAL HIGH (ref 0–5)
Specific Gravity, Urine: 1.01 (ref 1.005–1.030)
Squamous Epithelial / HPF: NONE SEEN (ref 0–5)
WBC, UA: >50 WBC/hpf — ABNORMAL HIGH (ref 0–5)
pH: 7 (ref 5.0–8.0)

## 2022-07-20 MED ORDER — CEFDINIR 300 MG PO CAPS: 300.0000 mg | ORAL_CAPSULE | Freq: Two times a day (BID) | ORAL | 0 refills | Status: DC

## 2022-07-20 MED ORDER — SODIUM CHLORIDE 0.9 % IV SOLN
1.0000 g | Freq: Once | INTRAVENOUS | Status: AC
  Administered 2022-07-20: 1 g via INTRAVENOUS
  Filled 2022-07-20: qty 10

## 2022-07-20 MED ORDER — SODIUM CHLORIDE 0.9 % IV BOLUS (SEPSIS)
500.0000 mL | Freq: Once | INTRAVENOUS | Status: AC
  Administered 2022-07-20: 500 mL via INTRAVENOUS

## 2022-07-20 MED ORDER — ACETAMINOPHEN 500 MG PO TABS: 1000.0000 mg | ORAL_TABLET | Freq: Once | ORAL | Status: DC

## 2022-07-20 NOTE — Discharge Instructions (Addendum)
If his urine culture comes back not growing any bacteria, you may stop his antibiotic.  I recommend close follow-up with his doctors at the [name].

## 2022-07-20 NOTE — ED Triage Notes (Signed)
Pt has prostate cancer and has had an urinary catheter since 06/25/22. Pt has had very little urine production x last 3 hours and has increased lower abdominal pain and pressure to urinate. Bladder scan during triage shows 165m of urine

## 2022-07-20 NOTE — ED Notes (Signed)
Bladder scan shows 177m urine

## 2022-07-20 NOTE — ED Provider Notes (Signed)
[facility] Provider Note    Event Date/Time   First MD Initiated Contact with Patient 07/20/22 [phone]     (approximate)   History   Urinary Retention   HPI  Steven Elliott is a 88 y.o. male with history of dementia, COPD, hypertension, urinary retention with Foley catheter in place since August 2023 last exchanged on 06/23/2022 who presents to the emergency department with his daughter for concerns for decreased urinary output, suprapubic pain.  It is difficult to obtain history from patient due to his dementia and daughter is upset due to his pain and is verbally aggressive.  No known fevers or vomiting.   History provided by patient and daughter, level 5 caveat.    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    Anosmia    BPH (benign prostatic hyperplasia)    COPD (chronic obstructive pulmonary disease) (HCC)    Diverticulosis    Emphysema (subcutaneous) (surgical) resulting from a procedure    severe by CT 03/20/16   GERD (gastroesophageal reflux disease)    History of bilateral inguinal hernias 02/2015   Hypertension    Peripheral vascular disease ([place]) 02/2015   heavily calcified but patent bil iliac arteries by CTA 03/20/16.    Past Surgical History:  Procedure Laterality Date   APPENDECTOMY     CHOLECYSTECTOMY     COLONOSCOPY  ~ 2007   at [place] Left 2014    MEDICATIONS:  Prior to Admission medications   Medication Sig Start Date End Date Taking? Authorizing Provider  acetaminophen (TYLENOL) 500 MG tablet Take 500 mg by mouth every 6 (six) hours as needed.    [provider]  albuterol (PROVENTIL HFA;VENTOLIN HFA) 108 (90 Base) MCG/ACT inhaler Inhale 2 puffs into the lungs every 6 (six) hours as needed for wheezing or shortness of breath.    [provider]   bicalutamide (CASODEX) 50 MG tablet Take 50 mg by mouth daily.    [provider]  Calcium Carbonate-Vitamin D (CALCIUM-D) 600-400 MG-UNIT TABS Take 1 tablet by mouth 2 (two) times daily.    [provider]  cetirizine (ZYRTEC) 5 MG tablet Take 1 tablet (5 mg total) by mouth daily. 09/13/20   Jaynee Eagles, PA-C  clotrimazole (LOTRIMIN) 1 % cream Apply 1 Application topically 2 (two) times daily. 06/04/22   Vaillancourt, Aldona Bar, PA-C  cyclobenzaprine (FLEXERIL) 5 MG tablet Take 5 mg by mouth 3 (three) times daily as needed for muscle spasms.    [provider]  DULoxetine (CYMBALTA) 20 MG capsule Take 20 mg by mouth 2 (two) times daily.    [provider]  famotidine (PEPCID) 20 MG tablet Take 1 tablet (20 mg total) by mouth at bedtime. 05/22/22   Loletha Grayer, MD  feeding supplement (ENSURE ENLIVE / ENSURE PLUS) LIQD Take 237 mLs by mouth 3 (three) times daily between meals. 05/22/22   Loletha Grayer, MD  fluticasone (FLONASE) 50 MCG/ACT nasal spray Place 1 spray into both nostrils daily.    [provider]  gabapentin (NEURONTIN) 100 MG capsule Take 1 capsule (100 mg total) by mouth at bedtime. 05/22/22   Loletha Grayer, MD  galantamine (RAZADYNE ER) 16 MG 24 hr capsule Take 1 capsule (16 mg total) by mouth daily with breakfast. 05/23/22   Wieting,  Hammad, MD  ipratropium (ATROVENT) 0.06 % nasal spray Place 2 sprays into both nostrils 2 (two) times daily.    [provider]  ketoconazole (NIZORAL) 2 % shampoo SHAMPOO AS DIRECTED AFFECTED AREA THREE TIMES A WEEK AS NEEDED (LEAVE ON AFFECTED AREA FOR 10 MINUTES, THEN RINSE OFF WITH WATER) MAY USE AS A FACE [place] DAILY TO SCALY SPOTS 03/02/22   [provider]  loperamide (IMODIUM) 2 MG capsule Take 1 capsule (2 mg total) by mouth 2 (two) times daily as needed for diarrhea or loose stools. 05/22/22   Loletha Grayer, MD  magnesium oxide (MAG-OX) 400 (240 Mg) MG tablet Take 400 mg by mouth  daily.    [provider]  memantine (NAMENDA) 10 MG tablet Take 10 mg by mouth 2 (two) times daily.    [provider]  Multiple Vitamin (MULTIVITAMIN WITH MINERALS) TABS tablet Take 1 tablet by mouth daily.    [provider]  oxyCODONE (OXY IR/ROXICODONE) 5 MG immediate release tablet Take 0.5 tablets (2.5 mg total) by mouth every 6 (six) hours as needed for severe pain. 05/22/22   Loletha Grayer, MD  tamsulosin (FLOMAX) 0.4 MG CAPS capsule Take 0.4 mg by mouth at bedtime.    [provider]  traZODone (DESYREL) 50 MG tablet Take 1 tablet (50 mg total) by mouth at bedtime. 05/22/22   Loletha Grayer, MD  vitamin B-12 (CYANOCOBALAMIN) 500 MCG tablet Take 1 tablet by mouth daily. 11/06/21   [provider]    Physical Exam   Triage Vital Signs: ED Triage Vitals  Enc Vitals Group     BP 07/20/22 0253 (!) 169/90     Pulse Rate 07/20/22 0253 (!) 140     Resp 07/20/22 0253 (!) 28     Temp 07/20/22 0253 99.1 F (37.3 C)     Temp Source 07/20/22 0253 Oral     SpO2 07/20/22 0253 94 %     Weight 07/20/22 0254 160 lb (72.6 kg)     Height 07/20/22 0254 '5\' 8"'$  (1.727 m)     Head Circumference --      Peak Flow --      Pain Score 07/20/22 0306 10     Pain Loc --      Pain Edu? --      Excl. in [place]? --     Most recent vital signs: Vitals:   07/20/22 0400 07/20/22 0641  BP: 134/81 138/84  Pulse: (!) 114 89  Resp: (!) 22 (!) 22  Temp:  98 F (36.7 C)  SpO2: 93% 94%    CONSTITUTIONAL: Alert and oriented and responds appropriately to questions.  Elderly, chronically ill-appearing, appears uncomfortable HEAD: Normocephalic, atraumatic EYES: Conjunctivae clear, pupils appear equal, sclera nonicteric ENT: normal nose; moist mucous membranes NECK: Supple, normal ROM CARD: Regular and tachycardic; S1 and S2 appreciated; no murmurs, no clicks, no rubs, no gallops RESP: Normal chest excursion without splinting or tachypnea; breath sounds clear and  equal bilaterally; no wheezes, no rhonchi, no rales, no hypoxia or respiratory distress, speaking full sentences ABD/GI: Normal bowel sounds; distended in the lower abdomen; soft, patient has suprapubic tenderness without guarding or rebound BACK: The back appears normal EXT: Normal ROM in all joints; no deformity noted, no edema; no cyanosis SKIN: Normal color for age and race; warm; no rash on exposed skin NEURO: Moves all extremities equally, normal speech PSYCH: The patient's mood and manner are appropriate.   ED Results / Procedures / Treatments  LABS: (all labs ordered are listed, but only abnormal results are displayed) Labs Reviewed  URINALYSIS, ROUTINE W REFLEX MICROSCOPIC - Abnormal; Notable for the following components:      Result Value   Color, Urine YELLOW (*)    APPearance CLOUDY (*)    Hgb urine dipstick MODERATE (*)    Protein, ur 100 (*)    Leukocytes,Ua LARGE (*)    RBC / HPF >50 (*)    WBC, UA >50 (*)    Bacteria, UA FEW (*)    All other components within normal limits  CBC WITH DIFFERENTIAL/PLATELET - Abnormal; Notable for the following components:   WBC 11.0 (*)    RBC 4.18 (*)    Hemoglobin 12.9 (*)    Neutro Abs 9.2 (*)    Abs Immature Granulocytes 0.09 (*)    All other components within normal limits  COMPREHENSIVE METABOLIC PANEL - Abnormal; Notable for the following components:   Glucose, Bld 135 (*)    Calcium 8.8 (*)    Total Protein 8.3 (*)    All other components within normal limits  URINE CULTURE     EKG:  EKG Interpretation  Date/Time:  Monday July 20 2022 04:35:08 EDT Ventricular Rate:  101 PR Interval:  248 QRS Duration: 130 QT Interval:  380 QTC Calculation: 492 R Axis:   98 Text Interpretation: Sinus tachycardia with 1st degree A-V block Right bundle branch block Abnormal ECG When compared with ECG of 10-May-2022 20:21, Premature atrial complexes are no longer Present PR interval has increased Confirmed by Pryor Curia  [phone]) on 07/20/2022 4:54:27 AM         RADIOLOGY: My personal review and interpretation of imaging:    I have personally reviewed all radiology reports.   No results found.   PROCEDURES:  Critical Care performed: No      Procedures    IMPRESSION / MDM / ASSESSMENT AND PLAN / ED COURSE  I reviewed the triage vital signs and the nursing notes.    Patient here with complaints of abdominal pain and decreased urine output from his catheter.  The patient is on the cardiac monitor to evaluate for evidence of arrhythmia and/or significant heart rate changes.   DIFFERENTIAL DIAGNOSIS (includes but not limited to):   Patient has cloudy appearing minimal amount of urine in his Foley catheter bag.  Suspect urinary retention due to clogged Foley, UTI.  Will evaluate for acute kidney injury.   Patient's presentation is most consistent with acute presentation with potential threat to life or bodily function.   PLAN: We will obtain CBC, CMP, urinalysis, urine culture and exchange his Foley catheter.  He is tachycardic likely from pain but will check EKG.  Discussed with patient's daughter as she is repeatedly stating to multiple staff members that she wants to give him something for pain that we will change his Foley catheter out quickly and that I am going to talk to the nurse about this immediately and I think this will relieve his pain much faster than an injection or pill.  Patient's daughter continues to be verbally aggressive and quite rude with myself unfortunately despite me explaining plan of care multiple times.  Updated patient's nurse immediately after leaving room requesting Foley catheter be exchanged soon as possible for patient comfort.   MEDICATIONS GIVEN IN ED: Medications  cefTRIAXone (ROCEPHIN) 1 g in sodium chloride 0.9 % 100 mL IVPB (1 g Intravenous New Bag/Given 07/20/22 0610)  sodium chloride 0.9 % bolus 500  mL (0 mLs Intravenous Stopped 07/20/22 0611)      ED COURSE: On my reevaluation patient's heart rate is 89 and his temperature is 98.  He has no pain at this time and is well-appearing with benign abdominal exam and his Foley is draining appropriately.  Labs reassuring with no significant leukocytosis, AKI or electrolyte derangement.  Urine does appear infected.  Culture is pending.  Daughter states she is apprehensive about giving him antibiotics because he has had diarrhea from antibiotics before but not C. difficile.  I did discuss with her that a urine culture is pending and that if this does not grow any bacteria she may follow-up on these results in my chart and is welcome to stop his antibiotics at that time.  She verbalized understanding.  Will discharge home with close outpatient follow-up with the [name].   At this time, I do not feel there is any life-threatening condition present. I reviewed all nursing notes, vitals, pertinent previous records.  All lab and urine results, EKGs, imaging ordered have been independently reviewed and interpreted by myself.  I reviewed all available radiology reports from any imaging ordered this visit.  Based on my assessment, I feel the patient is safe to be discharged home without further emergent workup and can continue workup as an outpatient as needed. Discussed all findings, treatment plan as well as usual and customary return precautions.  They verbalize understanding and are comfortable with this plan.  Outpatient follow-up has been provided as needed.  All questions have been answered.    CONSULTS: Admission considered but patient has no signs of sepsis right now and is feeling much better now that his Foley catheter is draining appropriately.   OUTSIDE RECORDS REVIEWED: Reviewed [facility] last [facility] on 07/08/2022.       FINAL CLINICAL IMPRESSION(S) / ED DIAGNOSES   Final diagnoses:  Obstruction of Foley catheter, initial encounter ([place])  Acute UTI     Rx / DC Orders   ED Discharge  Orders          Ordered    cefdinir (OMNICEF) 300 MG capsule  2 times daily        07/20/22 0640             Note:  This document was prepared using Dragon voice recognition software and may include unintentional dictation errors.   Joei Frangos, Delice Bison, DO 07/20/22 [phone]

## 2022-07-22 LAB — URINE CULTURE: Culture: >=100000 — AB

## 2022-08-11 ENCOUNTER — Ambulatory Visit: Payer: No Typology Code available for payment source | Admitting: Gastroenterology

## 2022-10-30 ENCOUNTER — Ambulatory Visit: Payer: No Typology Code available for payment source | Admitting: Physician Assistant

## 2023-04-18 ENCOUNTER — Encounter: Payer: Self-pay | Admitting: *Deleted

## 2023-04-18 ENCOUNTER — Emergency Department: Payer: No Typology Code available for payment source

## 2023-04-18 ENCOUNTER — Other Ambulatory Visit: Payer: Self-pay

## 2023-04-18 DIAGNOSIS — F0390 Unspecified dementia without behavioral disturbance: Secondary | ICD-10-CM | POA: Diagnosis not present

## 2023-04-18 DIAGNOSIS — R519 Headache, unspecified: Secondary | ICD-10-CM | POA: Insufficient documentation

## 2023-04-18 DIAGNOSIS — J449 Chronic obstructive pulmonary disease, unspecified: Secondary | ICD-10-CM | POA: Insufficient documentation

## 2023-04-18 LAB — BASIC METABOLIC PANEL
Anion gap: 15 (ref 5–15)
BUN: 13 mg/dL (ref 8–23)
CO2: 19 mmol/L — ABNORMAL LOW (ref 22–32)
Calcium: 8.6 mg/dL — ABNORMAL LOW (ref 8.9–10.3)
Chloride: 104 mmol/L (ref 98–111)
Creatinine, Ser: 1 mg/dL (ref 0.61–1.24)
GFR, Estimated: >60 mL/min (ref >60)
Glucose, Bld: 103 mg/dL — ABNORMAL HIGH (ref 70–99)
Potassium: 4.4 mmol/L (ref 3.5–5.1)
Sodium: 138 mmol/L (ref 135–145)

## 2023-04-18 LAB — CBC
HCT: 44.9 % (ref 39.0–52.0)
Hemoglobin: 14.5 g/dL (ref 13.0–17.0)
MCH: 32.2 pg (ref 26.0–34.0)
MCHC: 32.3 g/dL (ref 30.0–36.0)
MCV: 99.6 fL (ref 80.0–100.0)
Platelets: 219 10*3/uL (ref 150–400)
RBC: 4.51 MIL/uL (ref 4.22–5.81)
RDW: 12.5 % (ref 11.5–15.5)
WBC: 9.5 10*3/uL (ref 4.0–10.5)
nRBC: 0 % (ref 0.0–0.2)

## 2023-04-18 LAB — SEDIMENTATION RATE: Sed Rate: 11 mm/hr (ref 0–20)

## 2023-04-18 NOTE — ED Triage Notes (Signed)
Pt reports onset of "sharp" right sided pain in his head, lasting "only seconds", has happened twice today.  Denies dizziness, nausea. Reports "blurry vision" in the right eye about 30 minutes ago. No other focal neuro deficits. PT daughter reports hx of memory loss and calcified aneurysm in his brain.

## 2023-04-19 ENCOUNTER — Emergency Department
Admission: EM | Admit: 2023-04-19 | Discharge: 2023-04-19 | Disposition: A | Discharge status: Home / Self Care | Payer: No Typology Code available for payment source | Attending: Emergency Medicine | Admitting: Emergency Medicine

## 2023-04-19 DIAGNOSIS — R519 Headache, unspecified: Secondary | ICD-10-CM

## 2023-04-19 NOTE — ED Provider Notes (Signed)
[facility] Provider Note    Event Date/Time   First MD Initiated Contact with Patient 04/19/23 0023     (approximate)   History   Headache   HPI  Steven Elliott is a 89 y.o. male with history of dementia, AAA, COPD presenting to the emergency department for evaluation of headache.  Earlier today, patient had an episode of sharp shooting pain of the right side of his head lasting for few seconds.  Had a second episode a few hours later.  No associated dizziness, persistent headache, nausea, vomiting.  Later noticed some blurry sensation over his right eye.  Denies any scratches or foreign body sensation but says that the eye feels dry.  Denies double vision.  No fevers or chills.  No recent illness.  No chest pain or shortness of breath.  Accompanied by daughter who he lives with who says that he has been behaving at his baseline.    Physical Exam   Triage Vital Signs: ED Triage Vitals  Encounter Vitals Group     BP 04/18/23 2130 (!) 186/96     Systolic BP Percentile --      Diastolic BP Percentile --      Pulse Rate 04/18/23 2130 96     Resp 04/18/23 2130 16     Temp 04/18/23 2130 (!) 97.1 F (36.2 C)     Temp Source 04/18/23 2130 Oral     SpO2 04/18/23 2130 100 %     Weight --      Height --      Head Circumference --      Peak Flow --      Pain Score 04/18/23 2129 0     Pain Loc --      Pain Education --      Exclude from Growth Chart --     Most recent vital signs: Vitals:   04/18/23 2130  BP: (!) 186/96  Pulse: 96  Resp: 16  Temp: (!) 97.1 F (36.2 C)  SpO2: 100%     General: Awake, interactive  Eyes:  Pupils 3 mm, equal and reactive to light, no proptosis, no conjunctival injection, no visible foreign body.  Visual acuity 20/25 in left and right eye.  No tenderness along the temporal region. CV:  Regular rate, good peripheral perfusion.  Resp:  Lungs clear, unlabored respirations.  Abd:  Soft, nondistended.   Neuro:  Keenly aware, correctly answers month and age, able to blink eyes and squeeze hands, normal horizontal extraocular movements, no visual field loss, normal facial symmetry, no arm or leg motor drift, 5-5 strength in bilateral upper and lower extremities, normal finger to nose testing, normal sensation, no aphasia, no dysarthria, no inattention.    ED Results / Procedures / Treatments   Labs (all labs ordered are listed, but only abnormal results are displayed) Labs Reviewed  BASIC METABOLIC PANEL - Abnormal; Notable for the following components:      Result Value   CO2 19 (*)    Glucose, Bld 103 (*)    Calcium 8.6 (*)    All other components within normal limits  SEDIMENTATION RATE  CBC     EKG EKG independently reviewed interpreted by myself (ER attending) demonstrates:    RADIOLOGY Imaging independently reviewed and interpreted by myself demonstrates:  CT head without acute bleed  PROCEDURES:  Critical Care performed: No  Procedures   MEDICATIONS ORDERED IN ED: Medications - No data to display   IMPRESSION /  MDM / ASSESSMENT AND PLAN / ED COURSE  I reviewed the triage vital signs and the nursing notes.  Differential diagnosis includes, but is not limited to, intracranial bleed, benign headache, clinical presentation not suggestive of temporal arteritis, very atypical description for TIA, no focal deficits on exam suggestive of acute stroke  Patient's presentation is most consistent with acute presentation with potential threat to life or bodily function.  89 year old male presenting with episodes of shooting head pain, now resolved.  Lab work overall reassuring.  CT head without acute findings.  Neurologic exam without appreciable deficits.  Clinical description quite atypical for TIA, and low risk ABCD2 score.  Discussed results with patient and family.  I did additionally plan on fluorescein exam given report of right-sided eye discomfort, but patient says he  does not have a foreign body sensation and declines this.  Do think he is stable for discharge home with outpatient follow-up.  Strict return precautions provided.  Patient discharged in stable condition.     FINAL CLINICAL IMPRESSION(S) / ED DIAGNOSES   Final diagnoses:  Acute nonintractable headache, unspecified headache type     Rx / DC Orders   ED Discharge Orders     None        Note:  This document was prepared using Dragon voice recognition software and may include unintentional dictation errors.   Trinna Post, MD 04/19/23 [phone]

## 2023-04-19 NOTE — Discharge Instructions (Signed)
You were seen in the ER today for your head pains.  Fortunately your workup was reassuring.  Follow-up closely with your primary care doctor for further evaluation.  Return to the ER for new or worsening symptoms.

## 2023-08-29 ENCOUNTER — Emergency Department
Admission: EM | Admit: 2023-08-29 | Discharge: 2023-08-30 | Disposition: A | Discharge status: Home / Self Care | Payer: No Typology Code available for payment source | Attending: Emergency Medicine | Admitting: Emergency Medicine

## 2023-08-29 ENCOUNTER — Encounter: Payer: Self-pay | Admitting: Emergency Medicine

## 2023-08-29 ENCOUNTER — Other Ambulatory Visit: Payer: Self-pay

## 2023-08-29 ENCOUNTER — Emergency Department: Payer: No Typology Code available for payment source

## 2023-08-29 DIAGNOSIS — W19XXXA Unspecified fall, initial encounter: Secondary | ICD-10-CM

## 2023-08-29 DIAGNOSIS — S39012A Strain of muscle, fascia and tendon of lower back, initial encounter: Secondary | ICD-10-CM

## 2023-08-29 DIAGNOSIS — S59901A Unspecified injury of right elbow, initial encounter: Secondary | ICD-10-CM | POA: Diagnosis present

## 2023-08-29 DIAGNOSIS — S51011A Laceration without foreign body of right elbow, initial encounter: Secondary | ICD-10-CM | POA: Insufficient documentation

## 2023-08-29 DIAGNOSIS — W01198A Fall on same level from slipping, tripping and stumbling with subsequent striking against other object, initial encounter: Secondary | ICD-10-CM | POA: Insufficient documentation

## 2023-08-29 DIAGNOSIS — S51019A Laceration without foreign body of unspecified elbow, initial encounter: Secondary | ICD-10-CM

## 2023-08-29 MED ORDER — TRAMADOL HCL 50 MG PO TABS: 50.0000 mg | ORAL_TABLET | Freq: Four times a day (QID) | ORAL | 0 refills | Status: DC | PRN

## 2023-08-29 MED ORDER — TRAMADOL HCL 50 MG PO TABS
100.0000 mg | ORAL_TABLET | Freq: Once | ORAL | Status: AC
  Administered 2023-08-29: 100 mg via ORAL
  Filled 2023-08-29: qty 2

## 2023-08-29 NOTE — ED Provider Notes (Signed)
[facility] Provider Note    Event Date/Time   First MD Initiated Contact with Patient 08/29/23 2259     (approximate)   History   Fall   HPI Steven Elliott is a 89 y.o. male who presents for evaluation after a fall earlier tonight.  It was a mechanical fall where he bent over to pick up something, got over balance, then overcorrected and fell backwards.  He has a curvature of his spine which prevented him from striking the back of his head, but he struck his lower back which is what is hurting.  At rest he feels okay but it hurts when he tries to move around.  No numbness or tingling or weakness in his legs.  No urinary difficulties, neither retention nor incontinence.  He has a couple of small skin tears on his right elbow, otherwise he denies any other symptoms including headache, neck pain, chest pain, shortness of breath, and abdominal pain.     Physical Exam   Triage Vital Signs: ED Triage Vitals  Encounter Vitals Group     BP 08/29/23 1912 (!) 152/77     Systolic BP Percentile --      Diastolic BP Percentile --      Pulse Rate 08/29/23 1912 91     Resp 08/29/23 1912 18     Temp 08/29/23 1912 (!) 96.8 F (36 C)     Temp Source 08/29/23 1909 Oral     SpO2 08/29/23 1912 100 %     Weight 08/29/23 1910 79.4 kg (175 lb)     Height --      Head Circumference --      Peak Flow --      Pain Score 08/29/23 1910 3     Pain Loc --      Pain Education --      Exclude from Growth Chart --     Most recent vital signs: Vitals:   08/29/23 1912  BP: (!) 152/77  Pulse: 91  Resp: 18  Temp: (!) 96.8 F (36 C)  SpO2: 100%    General: Awake, no distress.  Elderly and somewhat frail but this seems to be his baseline.  He is awake and alert and sharp, and answering questions appropriately. CV:  Good peripheral perfusion.  Regular rate and rhythm. Resp:  Normal effort. Speaking easily and comfortably, no accessory muscle usage nor intercostal  retractions.   Abd:  No distention.  Other:  Mild soft tissue tenderness to palpation on either side of his lumbar spine but without point tenderness to palpation of the spine itself.  Patient is in pain when he tries to move around but is able to do so with assistance.  He has a substantial kyphosis at baseline.   ED Results / Procedures / Treatments   Labs (all labs ordered are listed, but only abnormal results are displayed) Labs Reviewed - No data to display   RADIOLOGY I viewed and interpreted the patient's lumbar spine x-rays and I see no evidence of fracture nor dislocation or listhesis.  I also read the radiologist's report, which confirmed no acute findings.   PROCEDURES:  Critical Care performed: No  .Laceration Repair  Date/Time: 08/29/2023 11:39 PM  Performed by: Loleta Rose, MD Authorized by: Loleta Rose, MD   Consent:    Consent obtained:  Verbal   Consent given by:  Patient and healthcare agent   Risks discussed:  Infection and poor cosmetic result Laceration details:  Location:  Shoulder/arm   Shoulder/arm location:  R elbow   Length (cm):  0.5 Skin repair:    Repair method:  Steri-Strips   Number of Steri-Strips:  1 Approximation:    Approximation:  Close Repair type:    Repair type:  Simple Post-procedure details:    Dressing:  Open (no dressing)   Procedure completion:  Tolerated well, no immediate complications .Laceration Repair  Date/Time: 08/29/2023 11:40 PM  Performed by: Loleta Rose, MD Authorized by: Loleta Rose, MD   Consent:    Consent obtained:  Verbal   Consent given by:  Patient and healthcare agent   Risks discussed:  Infection and poor cosmetic result Laceration details:    Length (cm):  2 Skin repair:    Repair method:  Steri-Strips   Number of Steri-Strips:  2 Approximation:    Approximation:  Close Repair type:    Repair type:  Simple Post-procedure details:    Dressing:  Open (no dressing)   Procedure  completion:  Tolerated well, no immediate complications     IMPRESSION / MDM / ASSESSMENT AND PLAN / ED COURSE  I reviewed the triage vital signs and the nursing notes.                              Differential diagnosis includes, but is not limited to, fracture, dislocation, contusion, musculoskeletal strain.  Patient's presentation is most consistent with acute presentation with potential threat to life or bodily function.  Labs/studies ordered: Lumbar spine x-rays  Interventions/Medications given:  Medications  traMADol (ULTRAM) tablet 100 mg (100 mg Oral Given 08/29/23 2338)    (Note:  hospital course my include additional interventions and/or labs/studies not listed above.)   Well-appearing despite age and fall.  Superficial skin tears on right elbow fixed with Steri-Strips.  Tramadol for pain given reassuring imaging, patient and his family is comfortable with the plan for pain control as an outpatient and follow-up with PCP.  No indication for further evaluation given the mechanical nature of the fall.  I gave my usual customary follow-up recommendations and return precautions.         FINAL CLINICAL IMPRESSION(S) / ED DIAGNOSES   Final diagnoses:  Fall, initial encounter  Lumbar strain, initial encounter  Skin tear of elbow without complication, initial encounter     Rx / DC Orders   ED Discharge Orders          Ordered    traMADol (ULTRAM) 50 MG tablet  Every 6 hours PRN        08/29/23 2342             Note:  This document was prepared using Dragon voice recognition software and may include unintentional dictation errors.   Loleta Rose, MD 08/29/23 [phone]

## 2023-08-29 NOTE — Discharge Instructions (Signed)

## 2023-08-29 NOTE — ED Triage Notes (Signed)
Patient c/o mechanical fall at around 1200 today.  Patient c/o lower back pain.

## 2023-09-08 ENCOUNTER — Emergency Department: Payer: No Typology Code available for payment source

## 2023-09-08 ENCOUNTER — Inpatient Hospital Stay
Admission: EM | Admit: 2023-09-08 | Discharge: 2023-09-27 | DRG: 551 | Disposition: A | Discharge status: Skilled Nursing Facility | Payer: No Typology Code available for payment source | Attending: Obstetrics and Gynecology | Admitting: Obstetrics and Gynecology

## 2023-09-08 ENCOUNTER — Other Ambulatory Visit: Payer: Self-pay

## 2023-09-08 DIAGNOSIS — F03911 Unspecified dementia, unspecified severity, with agitation: Secondary | ICD-10-CM | POA: Diagnosis present

## 2023-09-08 DIAGNOSIS — R1312 Dysphagia, oropharyngeal phase: Secondary | ICD-10-CM | POA: Diagnosis present

## 2023-09-08 DIAGNOSIS — I671 Cerebral aneurysm, nonruptured: Secondary | ICD-10-CM | POA: Diagnosis present

## 2023-09-08 DIAGNOSIS — M454 Ankylosing spondylitis of thoracic region: Secondary | ICD-10-CM | POA: Diagnosis present

## 2023-09-08 DIAGNOSIS — R7989 Other specified abnormal findings of blood chemistry: Secondary | ICD-10-CM | POA: Diagnosis present

## 2023-09-08 DIAGNOSIS — C61 Malignant neoplasm of prostate: Secondary | ICD-10-CM | POA: Diagnosis present

## 2023-09-08 DIAGNOSIS — N1831 Chronic kidney disease, stage 3a: Secondary | ICD-10-CM | POA: Diagnosis present

## 2023-09-08 DIAGNOSIS — N179 Acute kidney failure, unspecified: Secondary | ICD-10-CM | POA: Diagnosis present

## 2023-09-08 DIAGNOSIS — K219 Gastro-esophageal reflux disease without esophagitis: Secondary | ICD-10-CM | POA: Diagnosis present

## 2023-09-08 DIAGNOSIS — Z9049 Acquired absence of other specified parts of digestive tract: Secondary | ICD-10-CM

## 2023-09-08 DIAGNOSIS — I739 Peripheral vascular disease, unspecified: Secondary | ICD-10-CM | POA: Diagnosis present

## 2023-09-08 DIAGNOSIS — I951 Orthostatic hypotension: Secondary | ICD-10-CM | POA: Diagnosis present

## 2023-09-08 DIAGNOSIS — Z7409 Other reduced mobility: Secondary | ICD-10-CM

## 2023-09-08 DIAGNOSIS — I10 Essential (primary) hypertension: Secondary | ICD-10-CM | POA: Diagnosis present

## 2023-09-08 DIAGNOSIS — S22080A Wedge compression fracture of T11-T12 vertebra, initial encounter for closed fracture: Principal | ICD-10-CM

## 2023-09-08 DIAGNOSIS — M549 Dorsalgia, unspecified: Secondary | ICD-10-CM | POA: Diagnosis not present

## 2023-09-08 DIAGNOSIS — Z881 Allergy status to other antibiotic agents status: Secondary | ICD-10-CM

## 2023-09-08 DIAGNOSIS — Z79899 Other long term (current) drug therapy: Secondary | ICD-10-CM

## 2023-09-08 DIAGNOSIS — F05 Delirium due to known physiological condition: Secondary | ICD-10-CM | POA: Diagnosis not present

## 2023-09-08 DIAGNOSIS — R21 Rash and other nonspecific skin eruption: Secondary | ICD-10-CM | POA: Diagnosis not present

## 2023-09-08 DIAGNOSIS — W19XXXA Unspecified fall, initial encounter: Secondary | ICD-10-CM | POA: Diagnosis not present

## 2023-09-08 DIAGNOSIS — J690 Pneumonitis due to inhalation of food and vomit: Secondary | ICD-10-CM | POA: Diagnosis not present

## 2023-09-08 DIAGNOSIS — J449 Chronic obstructive pulmonary disease, unspecified: Secondary | ICD-10-CM | POA: Diagnosis present

## 2023-09-08 DIAGNOSIS — J309 Allergic rhinitis, unspecified: Secondary | ICD-10-CM | POA: Diagnosis present

## 2023-09-08 DIAGNOSIS — S22009A Unspecified fracture of unspecified thoracic vertebra, initial encounter for closed fracture: Secondary | ICD-10-CM | POA: Diagnosis present

## 2023-09-08 DIAGNOSIS — Y92009 Unspecified place in unspecified non-institutional (private) residence as the place of occurrence of the external cause: Secondary | ICD-10-CM

## 2023-09-08 DIAGNOSIS — Z880 Allergy status to penicillin: Secondary | ICD-10-CM

## 2023-09-08 DIAGNOSIS — N400 Enlarged prostate without lower urinary tract symptoms: Secondary | ICD-10-CM | POA: Diagnosis present

## 2023-09-08 DIAGNOSIS — C7951 Secondary malignant neoplasm of bone: Secondary | ICD-10-CM | POA: Diagnosis present

## 2023-09-08 DIAGNOSIS — Z885 Allergy status to narcotic agent status: Secondary | ICD-10-CM

## 2023-09-08 DIAGNOSIS — Z87891 Personal history of nicotine dependence: Secondary | ICD-10-CM

## 2023-09-08 DIAGNOSIS — F0390 Unspecified dementia without behavioral disturbance: Secondary | ICD-10-CM | POA: Diagnosis not present

## 2023-09-08 DIAGNOSIS — D638 Anemia in other chronic diseases classified elsewhere: Secondary | ICD-10-CM | POA: Diagnosis present

## 2023-09-08 DIAGNOSIS — Z515 Encounter for palliative care: Secondary | ICD-10-CM

## 2023-09-08 DIAGNOSIS — E873 Alkalosis: Secondary | ICD-10-CM | POA: Diagnosis not present

## 2023-09-08 DIAGNOSIS — Z85828 Personal history of other malignant neoplasm of skin: Secondary | ICD-10-CM

## 2023-09-08 DIAGNOSIS — I129 Hypertensive chronic kidney disease with stage 1 through stage 4 chronic kidney disease, or unspecified chronic kidney disease: Secondary | ICD-10-CM | POA: Diagnosis present

## 2023-09-08 DIAGNOSIS — J9601 Acute respiratory failure with hypoxia: Secondary | ICD-10-CM | POA: Diagnosis not present

## 2023-09-08 DIAGNOSIS — D649 Anemia, unspecified: Secondary | ICD-10-CM | POA: Diagnosis present

## 2023-09-08 DIAGNOSIS — N182 Chronic kidney disease, stage 2 (mild): Secondary | ICD-10-CM | POA: Diagnosis present

## 2023-09-08 DIAGNOSIS — Z96652 Presence of left artificial knee joint: Secondary | ICD-10-CM | POA: Diagnosis present

## 2023-09-08 LAB — CBC WITH DIFFERENTIAL/PLATELET
Abs Immature Granulocytes: 0.11 10*3/uL — ABNORMAL HIGH (ref 0.00–0.07)
Basophils Absolute: 0 10*3/uL (ref 0.0–0.1)
Basophils Relative: 0 %
Eosinophils Absolute: 0 10*3/uL (ref 0.0–0.5)
Eosinophils Relative: 0 %
HCT: 38.4 % — ABNORMAL LOW (ref 39.0–52.0)
Hemoglobin: 12.8 g/dL — ABNORMAL LOW (ref 13.0–17.0)
Immature Granulocytes: 1 %
Lymphocytes Relative: 9 %
Lymphs Abs: 0.8 10*3/uL (ref 0.7–4.0)
MCH: 31.9 pg (ref 26.0–34.0)
MCHC: 33.3 g/dL (ref 30.0–36.0)
MCV: 95.8 fL (ref 80.0–100.0)
Monocytes Absolute: 0.6 10*3/uL (ref 0.1–1.0)
Monocytes Relative: 6 %
Neutro Abs: 8.4 10*3/uL — ABNORMAL HIGH (ref 1.7–7.7)
Neutrophils Relative %: 84 %
Platelets: 217 10*3/uL (ref 150–400)
RBC: 4.01 MIL/uL — ABNORMAL LOW (ref 4.22–5.81)
RDW: 13.5 % (ref 11.5–15.5)
WBC: 9.9 10*3/uL (ref 4.0–10.5)
nRBC: 0 % (ref 0.0–0.2)

## 2023-09-08 LAB — URINALYSIS, COMPLETE (UACMP) WITH MICROSCOPIC
Bilirubin Urine: NEGATIVE
Glucose, UA: NEGATIVE mg/dL
Hgb urine dipstick: NEGATIVE
Ketones, ur: 20 mg/dL — AB
Leukocytes,Ua: NEGATIVE
Nitrite: NEGATIVE
Protein, ur: 100 mg/dL — AB
Specific Gravity, Urine: 1.019 (ref 1.005–1.030)
pH: 5 (ref 5.0–8.0)

## 2023-09-08 LAB — BASIC METABOLIC PANEL
Anion gap: 9 (ref 5–15)
BUN: 15 mg/dL (ref 8–23)
CO2: 23 mmol/L (ref 22–32)
Calcium: 8.3 mg/dL — ABNORMAL LOW (ref 8.9–10.3)
Chloride: 102 mmol/L (ref 98–111)
Creatinine, Ser: 1 mg/dL (ref 0.61–1.24)
GFR, Estimated: >60 mL/min (ref >60)
Glucose, Bld: 127 mg/dL — ABNORMAL HIGH (ref 70–99)
Potassium: 3.9 mmol/L (ref 3.5–5.1)
Sodium: 134 mmol/L — ABNORMAL LOW (ref 135–145)

## 2023-09-08 MED ORDER — OXYCODONE HCL 5 MG PO TABS
2.5000 mg | ORAL_TABLET | Freq: Once | ORAL | Status: AC
  Administered 2023-09-08: 2.5 mg via ORAL
  Filled 2023-09-08: qty 1

## 2023-09-08 MED ORDER — MORPHINE SULFATE (PF) 2 MG/ML IV SOLN
2.0000 mg | INTRAVENOUS | Status: DC | PRN
  Administered 2023-09-09 (×3): 2 mg via INTRAVENOUS
  Filled 2023-09-08 (×3): qty 1

## 2023-09-08 MED ORDER — SODIUM CHLORIDE 0.9 % IV SOLN
Freq: Once | INTRAVENOUS | Status: AC
Start: 2023-09-09 — End: 2023-09-09

## 2023-09-08 MED ORDER — HEPARIN SODIUM (PORCINE) 5000 UNIT/ML IJ SOLN
5000.0000 [IU] | Freq: Two times a day (BID) | INTRAMUSCULAR | Status: DC
  Administered 2023-09-09 – 2023-09-13 (×10): 5000 [IU] via SUBCUTANEOUS
  Filled 2023-09-08 (×9): qty 1

## 2023-09-08 MED ORDER — LIDOCAINE 5 % EX PTCH
1.0000 | MEDICATED_PATCH | CUTANEOUS | Status: DC
  Administered 2023-09-08 – 2023-09-26 (×19): 1 via TRANSDERMAL
  Filled 2023-09-08 (×19): qty 1

## 2023-09-08 MED ORDER — ACETAMINOPHEN 10 MG/ML IV SOLN
1000.0000 mg | Freq: Once | INTRAVENOUS | Status: AC
  Administered 2023-09-08: 1000 mg via INTRAVENOUS
  Filled 2023-09-08: qty 100

## 2023-09-08 MED ORDER — SODIUM CHLORIDE 0.9% FLUSH
3.0000 mL | Freq: Two times a day (BID) | INTRAVENOUS | Status: DC
  Administered 2023-09-09 – 2023-09-25 (×33): 3 mL via INTRAVENOUS

## 2023-09-08 NOTE — H&P (Signed)
History and Physical    Patient: Steven Elliott HYQ:[id number] DOB: Jul 25, 1930 DOA: 09/08/2023 DOS: the patient was seen and examined on 09/09/2023 PCP: [facility]  Patient coming from: Home  Chief Complaint:  Chief Complaint  Patient presents with   Fall    HPI: Steven Elliott is a 89 y.o. male with medical history significant for dementia, chronic kidney disease, COPD, hyponatremia prostate cancer with metastasis to the bone presents to the emergency department for evaluation of back pain, since his fall.  Comes via EMS for back pain that is worse since his fall last week on August 29, 2023 when he fell backwards.  Patient is having difficulty ambulating due to the pain.  No reports of incontinence of bowel or bladder or bandlike pain.  In the ED patient needed assistance from 2 people to get out of his recliner.  X-ray showed patient has a compression fracture that is acute in the thoracic spine.  Neurosurgery contacted who recommended pain control patient for physical therapy as tolerated, admission for back pain.  In emergency room vitals trend shows: Vitals:   09/08/23 2345 09/09/23 0030 09/09/23 0044 09/09/23 0200  BP: (!) 164/90  (!) 167/104 (!) 139/98  Pulse: 99 (!) 104 (!) 103 (!) 105  Temp:   98.4 F (36.9 C)   Resp: 19   14  Height:      Weight:      SpO2: 97% 96% 95% 96%  TempSrc:   Oral   BMI (Calculated):       Labs are notable for sodium 134 normal normal electrolytes otherwise glucose 127 normal kidney function. CBC shows anemia that is mild with a hemoglobin of 12.8 normal white count normal platelets. Urinalysis is yellow hazy with 20 ketones 100 proteins many bacteria 11-20 white count  In the ED pt received: Medications  lidocaine (LIDODERM) 5 % 1 patch (1 patch Transdermal Patch Applied 09/08/23 1753)  sodium chloride flush (NS) 0.9 % injection 3 mL (3 mLs Intravenous Given 09/09/23 0039)  morphine (PF) 2 MG/ML injection 2 mg (2  mg Intravenous Given 09/09/23 0038)  heparin injection 5,000 Units (5,000 Units Subcutaneous Given 09/09/23 0038)  darolutamide (NUBEQA) tablet 600 mg (has no administration in time range)  tamsulosin (FLOMAX) capsule 0.4 mg (has no administration in time range)  memantine (NAMENDA) tablet 10 mg (has no administration in time range)  ipratropium (ATROVENT) 0.06 % nasal spray 2 spray (has no administration in time range)  galantamine (RAZADYNE ER) 24 hr capsule 16 mg (has no administration in time range)  gabapentin (NEURONTIN) capsule 100 mg (has no administration in time range)  fluticasone (FLONASE) 50 MCG/ACT nasal spray 1 spray (has no administration in time range)  DULoxetine (CYMBALTA) DR capsule 20 mg (has no administration in time range)  famotidine (PEPCID) tablet 20 mg (has no administration in time range)  albuterol (PROVENTIL) (2.5 MG/3ML) 0.083% nebulizer solution 2.5 mg (has no administration in time range)  bicalutamide (CASODEX) tablet 50 mg (has no administration in time range)  oxyCODONE (Oxy IR/ROXICODONE) immediate release tablet 2.5 mg (2.5 mg Oral Given 09/08/23 1754)  acetaminophen (OFIRMEV) IV 1,000 mg (0 mg Intravenous Stopped 09/08/23 1920)  0.9 %  sodium chloride infusion ( Intravenous New Bag/Given 09/09/23 0039)   Review of Systems  Unable to perform ROS: Age   Past Medical History:  Diagnosis Date   AKI (acute kidney injury) (HCC) 05/21/2022   Allergic rhinitis    Aneurysm of infrarenal abdominal aorta (HCC)  02/2015   5.4 cm by CT 03/20/16.    Anosmia    BPH (benign prostatic hyperplasia)    COPD (chronic obstructive pulmonary disease) (HCC)    Diverticulosis    Emphysema (subcutaneous) (surgical) resulting from a procedure    severe by CT 03/20/16   GERD (gastroesophageal reflux disease)    History of bilateral inguinal hernias 02/2015   Hypertension    Peripheral vascular disease (HCC) 02/2015   heavily calcified but patent bil iliac arteries by CTA  03/20/16.   Past Surgical History:  Procedure Laterality Date   APPENDECTOMY     CHOLECYSTECTOMY     COLONOSCOPY  ~ 2007   at [facility]   SKIN CANCER EXCISION     TOTAL KNEE ARTHROPLASTY Left 2014    reports that he has quit smoking. His smoking use included cigarettes and cigars. He has never used smokeless tobacco. He reports current alcohol use. He reports that he does not use drugs.  Allergies  Allergen Reactions   Procaine Anaphylaxis   Ciprofloxacin Other (See Comments) and Nausea And Vomiting    Malaise, Rigor   Hydrocodone     Other reaction(s): Rash, Hives, Heart palpitations ()   Hydrocodone-Acetaminophen Itching    Watery eyes   Lisinopril Other (See Comments)   Omeprazole Other (See Comments)    GI reaction   Penicillins Other (See Comments)   Rabeprazole Other (See Comments)    GI reaction    Family History  Problem Relation Age of Onset   Breast cancer Mother    Pancreatitis Mother    Breast cancer Sister     Prior to Admission medications   Medication Sig Start Date End Date Taking? Authorizing Provider  acetaminophen (TYLENOL) 500 MG tablet Take 500 mg by mouth every 6 (six) hours as needed.    [provider]  albuterol (PROVENTIL HFA;VENTOLIN HFA) 108 (90 Base) MCG/ACT inhaler Inhale 2 puffs into the lungs every 6 (six) hours as needed for wheezing or shortness of breath.    [provider]  bicalutamide (CASODEX) 50 MG tablet Take 50 mg by mouth daily.    [provider]  Calcium Carbonate-Vitamin D (CALCIUM-D) 600-400 MG-UNIT TABS Take 1 tablet by mouth 2 (two) times daily.    [provider]  cefdinir (OMNICEF) 300 MG capsule Take 1 capsule (300 mg total) by mouth 2 (two) times daily. 07/20/22   Ward, Layla Maw, DO  cetirizine (ZYRTEC) 5 MG tablet Take 1 tablet (5 mg total) by mouth daily. 09/13/20   Wallis Bamberg, PA-C  clotrimazole (LOTRIMIN) 1 % cream Apply 1 Application topically 2 (two) times daily. 06/04/22    Vaillancourt, Lelon Mast, PA-C  cyclobenzaprine (FLEXERIL) 5 MG tablet Take 5 mg by mouth 3 (three) times daily as needed for muscle spasms.    [provider]  DULoxetine (CYMBALTA) 20 MG capsule Take 20 mg by mouth 2 (two) times daily.    [provider]  famotidine (PEPCID) 20 MG tablet Take 1 tablet (20 mg total) by mouth at bedtime. 05/22/22   Alford Highland, MD  feeding supplement (ENSURE ENLIVE / ENSURE PLUS) LIQD Take 237 mLs by mouth 3 (three) times daily between meals. 05/22/22   Alford Highland, MD  fluticasone (FLONASE) 50 MCG/ACT nasal spray Place 1 spray into both nostrils daily.    [provider]  gabapentin (NEURONTIN) 100 MG capsule Take 1 capsule (100 mg total) by mouth at bedtime. 05/22/22   Alford Highland, MD  galantamine (RAZADYNE ER)  16 MG 24 hr capsule Take 1 capsule (16 mg total) by mouth daily with breakfast. 05/23/22   Alford Highland, MD  ipratropium (ATROVENT) 0.06 % nasal spray Place 2 sprays into both nostrils 2 (two) times daily.    [provider]  ketoconazole (NIZORAL) 2 % shampoo SHAMPOO AS DIRECTED AFFECTED AREA THREE TIMES A WEEK AS NEEDED (LEAVE ON AFFECTED AREA FOR 10 MINUTES, THEN RINSE OFF WITH WATER) MAY USE AS A FACE WASH DAILY TO SCALY SPOTS 03/02/22   [provider]  loperamide (IMODIUM) 2 MG capsule Take 1 capsule (2 mg total) by mouth 2 (two) times daily as needed for diarrhea or loose stools. 05/22/22   Alford Highland, MD  magnesium oxide (MAG-OX) 400 (240 Mg) MG tablet Take 400 mg by mouth daily.    [provider]  memantine (NAMENDA) 10 MG tablet Take 10 mg by mouth 2 (two) times daily.    [provider]  Multiple Vitamin (MULTIVITAMIN WITH MINERALS) TABS tablet Take 1 tablet by mouth daily.    [provider]  oxyCODONE (OXY IR/ROXICODONE) 5 MG immediate release tablet Take 0.5 tablets (2.5 mg total) by mouth every 6 (six) hours as needed for severe pain. 05/22/22   Alford Highland,  MD  tamsulosin (FLOMAX) 0.4 MG CAPS capsule Take 0.4 mg by mouth at bedtime.    [provider]  traMADol (ULTRAM) 50 MG tablet Take 1 tablet (50 mg total) by mouth every 6 (six) hours as needed. 08/29/23 08/28/24  Loleta Rose, MD  traZODone (DESYREL) 50 MG tablet Take 1 tablet (50 mg total) by mouth at bedtime. 05/22/22   Alford Highland, MD  vitamin B-12 (CYANOCOBALAMIN) 500 MCG tablet Take 1 tablet by mouth daily. 11/06/21   [provider]     Vitals:   09/08/23 2345 09/09/23 0030 09/09/23 0044 09/09/23 0200  BP: (!) 164/90  (!) 167/104 (!) 139/98  Pulse: 99 (!) 104 (!) 103 (!) 105  Resp: 19   14  Temp:   98.4 F (36.9 C)   TempSrc:   Oral   SpO2: 97% 96% 95% 96%  Weight:      Height:       Physical Exam Vitals and nursing note reviewed.  Constitutional:      General: He is not in acute distress. HENT:     Head: Normocephalic and atraumatic.     Right Ear: Hearing normal.     Left Ear: Hearing normal.     Nose: Nose normal. No nasal deformity.     Mouth/Throat:     Lips: Pink.     Tongue: No lesions.     Pharynx: Oropharynx is clear.  Eyes:     General: Lids are normal.     Extraocular Movements: Extraocular movements intact.  Cardiovascular:     Rate and Rhythm: Normal rate and regular rhythm.     Heart sounds: Normal heart sounds.  Pulmonary:     Effort: Pulmonary effort is normal.     Breath sounds: Wheezing present.  Abdominal:     General: Bowel sounds are normal. There is no distension.     Palpations: Abdomen is soft. There is no mass.     Tenderness: There is no abdominal tenderness.  Musculoskeletal:     Cervical back: No tenderness.     Right lower leg: No edema.     Left lower leg: No edema.  Skin:    General: Skin is warm.  Neurological:  General: No focal deficit present.     Mental Status: He is alert and oriented to person, place, and time.     Cranial Nerves: Cranial nerves 2-12 are intact.  Psychiatric:        Attention  and Perception: Attention normal.        Mood and Affect: Mood normal.        Speech: Speech normal.        Behavior: Behavior normal. Behavior is cooperative.      Labs on Admission: I have personally reviewed following labs and imaging studies Results for orders placed or performed during the hospital encounter of 09/08/23 (from the past 24 hours)  CBC with Differential     Status: Abnormal   Collection Time: 09/08/23  1:42 PM  Result Value Ref Range   WBC 9.9 4.0 - 10.5 K/uL   RBC 4.01 (L) 4.22 - 5.81 MIL/uL   Hemoglobin 12.8 (L) 13.0 - 17.0 g/dL   HCT 08.6 (L) 57.8 - 46.9 %   MCV 95.8 80.0 - 100.0 fL   MCH 31.9 26.0 - 34.0 pg   MCHC 33.3 30.0 - 36.0 g/dL   RDW 62.9 52.8 - 41.3 %   Platelets 217 150 - 400 K/uL   nRBC 0.0 0.0 - 0.2 %   Neutrophils Relative % 84 %   Neutro Abs 8.4 (H) 1.7 - 7.7 K/uL   Lymphocytes Relative 9 %   Lymphs Abs 0.8 0.7 - 4.0 K/uL   Monocytes Relative 6 %   Monocytes Absolute 0.6 0.1 - 1.0 K/uL   Eosinophils Relative 0 %   Eosinophils Absolute 0.0 0.0 - 0.5 K/uL   Basophils Relative 0 %   Basophils Absolute 0.0 0.0 - 0.1 K/uL   Immature Granulocytes 1 %   Abs Immature Granulocytes 0.11 (H) 0.00 - 0.07 K/uL  Basic metabolic panel     Status: Abnormal   Collection Time: 09/08/23  5:17 PM  Result Value Ref Range   Sodium 134 (L) 135 - 145 mmol/L   Potassium 3.9 3.5 - 5.1 mmol/L   Chloride 102 98 - 111 mmol/L   CO2 23 22 - 32 mmol/L   Glucose, Bld 127 (H) 70 - 99 mg/dL   BUN 15 8 - 23 mg/dL   Creatinine, Ser 2.44 0.61 - 1.24 mg/dL   Calcium 8.3 (L) 8.9 - 10.3 mg/dL   GFR, Estimated >01 >02 mL/min   Anion gap 9 5 - 15  Urinalysis, Complete w Microscopic -Urine, Clean Catch     Status: Abnormal   Collection Time: 09/08/23  6:04 PM  Result Value Ref Range   Color, Urine YELLOW (A) YELLOW   APPearance HAZY (A) CLEAR   Specific Gravity, Urine 1.019 1.005 - 1.030   pH 5.0 5.0 - 8.0   Glucose, UA NEGATIVE NEGATIVE mg/dL   Hgb urine dipstick  NEGATIVE NEGATIVE   Bilirubin Urine NEGATIVE NEGATIVE   Ketones, ur 20 (A) NEGATIVE mg/dL   Protein, ur 725 (A) NEGATIVE mg/dL   Nitrite NEGATIVE NEGATIVE   Leukocytes,Ua NEGATIVE NEGATIVE   RBC / HPF 0-5 0 - 5 RBC/hpf   WBC, UA 11-20 0 - 5 WBC/hpf   Bacteria, UA MANY (A) NONE SEEN   Squamous Epithelial / HPF 0-5 0 - 5 /HPF   Mucus PRESENT     CBC: Recent Labs  Lab 09/08/23 1342  WBC 9.9  NEUTROABS 8.4*  HGB 12.8*  HCT 38.4*  MCV 95.8  PLT 217   Basic  Metabolic Panel: Recent Labs  Lab 09/08/23 1717  NA 134*  K 3.9  CL 102  CO2 23  GLUCOSE 127*  BUN 15  CREATININE 1.00  CALCIUM 8.3*    Unresulted Labs (From admission, onward)     Start     Ordered   09/09/23 0500  CBC  Once,   R        09/09/23 0500   09/09/23 0500  Comprehensive metabolic panel  Once,   R        09/09/23 0500   09/09/23 0500  T4, free  Once,   AD        09/09/23 0500   09/09/23 0500  TSH  Once,   AD        09/09/23 0500   09/09/23 0312  D-dimer, quantitative  Once,   R        09/09/23 0311   09/09/23 0251  Brain natriuretic peptide  Add-on,   AD        09/09/23 0251   09/08/23 2017  Urine Culture  Add-on,   AD       Question:  Indication  Answer:  Dysuria   09/08/23 2017            Medications  lidocaine (LIDODERM) 5 % 1 patch (1 patch Transdermal Patch Applied 09/08/23 1753)  sodium chloride flush (NS) 0.9 % injection 3 mL (3 mLs Intravenous Given 09/09/23 0039)  morphine (PF) 2 MG/ML injection 2 mg (2 mg Intravenous Given 09/09/23 0038)  heparin injection 5,000 Units (5,000 Units Subcutaneous Given 09/09/23 0038)  darolutamide (NUBEQA) tablet 600 mg (has no administration in time range)  tamsulosin (FLOMAX) capsule 0.4 mg (has no administration in time range)  memantine (NAMENDA) tablet 10 mg (has no administration in time range)  ipratropium (ATROVENT) 0.06 % nasal spray 2 spray (has no administration in time range)  galantamine (RAZADYNE ER) 24 hr capsule 16 mg (has no  administration in time range)  gabapentin (NEURONTIN) capsule 100 mg (has no administration in time range)  fluticasone (FLONASE) 50 MCG/ACT nasal spray 1 spray (has no administration in time range)  DULoxetine (CYMBALTA) DR capsule 20 mg (has no administration in time range)  famotidine (PEPCID) tablet 20 mg (has no administration in time range)  albuterol (PROVENTIL) (2.5 MG/3ML) 0.083% nebulizer solution 2.5 mg (has no administration in time range)  bicalutamide (CASODEX) tablet 50 mg (has no administration in time range)  oxyCODONE (Oxy IR/ROXICODONE) immediate release tablet 2.5 mg (2.5 mg Oral Given 09/08/23 1754)  acetaminophen (OFIRMEV) IV 1,000 mg (0 mg Intravenous Stopped 09/08/23 1920)  0.9 %  sodium chloride infusion ( Intravenous New Bag/Given 09/09/23 0039)    Radiological Exams on Admission: CT Lumbar Spine Wo Contrast Result Date: 09/08/2023 CLINICAL DATA:  Back trauma, no prior imaging (Age >= 16y). Low back pain since a fall 10 days ago. History of prostate cancer. EXAM: CT LUMBAR SPINE WITHOUT CONTRAST TECHNIQUE: Multidetector CT imaging of the lumbar spine was performed without intravenous contrast administration. Multiplanar CT image reconstructions were also generated. RADIATION DOSE REDUCTION: This exam was performed according to the departmental dose-optimization program which includes automated exposure control, adjustment of the mA and/or kV according to patient size and/or use of iterative reconstruction technique. COMPARISON:  Lumbar spine radiographs 08/29/2023. CT abdomen and pelvis 05/20/2022. PET-CT 06/22/2023. FINDINGS: Segmentation: 5 lumbar type vertebrae. Alignment: Mild-to-moderate thoracolumbar dextroscoliosis. Trace anterolisthesis of L4 on L5. Vertebrae: Diffuse osteopenia. Acute fractures involving the T11 inferior and T12 superior  endplates and vertebral syndesmophytes with at most minimal displacement and no definitive posterior element fracture. Interbody  ankylosis from T9-T11 and T12-L2. Partial ankylosis of the SI joints. Sclerotic lesions in the posteromedial right tenth rib, right T11 transverse process, and right L2 transverse process, similar to the prior PET-CT. Paraspinal and other soft tissues: Cholecystectomy. Aortoiliac stent graft. Emphysema. Aortic atherosclerosis. Dependent atelectasis in the lungs, likely with small left and possible trace right pleural effusions. Disc levels: Mild disc degeneration and advanced facet arthrosis in the lower lumbar spine. No evidence of high-grade stenosis. IMPRESSION: 1. Acute, at most minimally displaced fractures through the T11-12 disc space and adjacent vertebral bodies. 2. 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 3. Known small sclerotic bone metastases. 4.  Aortic Atherosclerosis (ICD10-I70.0). Electronically Signed   By: Sebastian Ache M.D.   On: 09/08/2023 16:28     Data Reviewed: Relevant notes from primary care and specialist visits, past discharge summaries as available in EHR, including Care Everywhere. Prior diagnostic testing as pertinent to current admission diagnoses Updated medications and problem lists for reconciliation ED course, including vitals, labs, imaging, treatment and response to treatment Triage notes, nursing and pharmacy notes and ED provider's notes Notable results as noted in HPI  Assessment and Plan: * Fall Fall precautions.  Suspect presyncope or dizziness.  Will get orthostatic BP.  Will obtain mri of brain as well.  Can also be from medication related causing blood pressure fluctuation with Cymbalta or Flomax.  Pt needs aggressive PT he is frail and weak appearing.     AKI (acute kidney injury) (HCC)-resolved as of 09/08/2023 Lab Results  Component Value Date   CREATININE 1.00 09/08/2023   CREATININE 1.00 04/18/2023   CREATININE 1.03 07/20/2022  Patient has history of AKI currently stable and resolved.   Orthostatic  hypotension Patient has history of orthostatic hypotension and will recheck today. Fall precaution. Aspiration precaution.   Abnormal LFTs    Latest Ref Rng & Units 07/20/2022    3:40 AM 05/21/2022    3:41 AM 05/12/2022    4:09 AM  Hepatic Function  Total Protein 6.5 - 8.1 g/dL 8.3  6.9  7.2   Albumin 3.5 - 5.0 g/dL 3.6  2.9  3.3   AST 15 - 41 U/L 28  23  47   ALT 0 - 44 U/L 15  18  54   Alk Phosphatase 38 - 126 U/L 88  90  195   Total Bilirubin 0.3 - 1.2 mg/dL 0.7  1.0  0.7   Bilirubin, Direct 0.0 - 0.2 mg/dL   0.1   LFT added on and pending.   COPD (chronic obstructive pulmonary disease) (HCC) Patient has a history of severe COPD. Will obtain a CT of his chest as well. SpO2: 96 % O2 Flow Rate (L/min): 2 L/min   Chronic kidney disease, stage 3a (HCC) Currently stable we will follow and renally dose needed antibiotics. Lab Results  Component Value Date   CREATININE 1.00 09/08/2023   CREATININE 1.00 04/18/2023   CREATININE 1.03 07/20/2022     Dementia (HCC) Today at bedside patient is alert and oriented to self and the year but not knows the president and is disoriented and confused just received morphine for pain. Will do neurochecks and head ct. If negative will consider MRI brain pt may Have IIH.  Cont Cymbalta / galantamine and namenda  After pt has had swallow.    Fracture dislocation of thoracic spine, closed, initial  encounter (HCC) Pt coming with back pain after fall and found to have fracture of T11-T12 and now in TLSO brace her for pain control outpatient appt with NS.  D/D include Fall VS syncope.    Anemia    Latest Ref Rng & Units 09/08/2023    1:42 PM 04/18/2023    9:38 PM 07/20/2022    3:40 AM  CBC  WBC 4.0 - 10.5 K/uL 9.9  9.5  11.0   Hemoglobin 13.0 - 17.0 g/dL 65.7  84.6  96.2   Hematocrit 39.0 - 52.0 % 38.4  44.9  39.3   Platelets 150 - 400 K/uL 217  219  207   Stable will follow pt has h/o Rectal bleeding.     Oropharyngeal  dysphagia We will do swallow evaluation.   Essential (primary) hypertension Vitals:   09/08/23 1443 09/08/23 1857 09/08/23 2345 09/09/23 0044  BP: 135/80 (!) 143/95 (!) 164/90 (!) 167/104   09/09/23 0200  BP: (!) 139/98  We will manage with PRN hydralazine.    Cerebral aneurysm, nonruptured Fall today with no reports of head trauma will do head ct rule out same.   Prostate cancer metastatic to bone [facility]) Suspect patient has hypogonadism and osteoporosis with mets secondary to prostate cancer and hypogonadism. Will continue patient on his casodex 2 mg daily.   DVT prophylaxis:  Heparin   Consults:  None   Advance Care Planning:    Code Status: Full Code   Family Communication:  None   Disposition Plan:  TBD.   Severity of Illness: The appropriate patient status for this patient is INPATIENT. Inpatient status is judged to be reasonable and necessary in order to provide the required intensity of service to ensure the patient's safety. The patient's presenting symptoms, physical exam findings, and initial radiographic and laboratory data in the context of their chronic comorbidities is felt to place them at high risk for further clinical deterioration. Furthermore, it is not anticipated that the patient will be medically stable for discharge from the hospital within 2 midnights of admission.   * I certify that at the point of admission it is my clinical judgment that the patient will require inpatient hospital care spanning beyond 2 midnights from the point of admission due to high intensity of service, high risk for further deterioration and high frequency of surveillance required.*  Author: Gertha Calkin, MD 09/09/2023 3:26 AM  For on call review [email or web address].   Orders Placed This Encounter  Procedures   Urine Culture    Standing Status:   Standing    Number of Occurrences:   1    Indication:   Dysuria   CT Lumbar Spine Wo Contrast    Standing Status:   Standing     Number of Occurrences:   1   CT Angio Chest Pulmonary Embolism (PE) W or WO Contrast    Standing Status:   Standing    Number of Occurrences:   1    Does the patient have a contrast media/X-ray dye allergy?:   No    If indicated for the ordered procedure, I authorize the administration of contrast media per Radiology protocol:   Yes   CT HEAD WO CONTRAST ( )    Standing Status:   Standing    Number of Occurrences:   1   CBC with Differential    Standing Status:   Standing    Number of Occurrences:   1   Basic metabolic panel  Standing Status:   Standing    Number of Occurrences:   1   Urinalysis, Complete w Microscopic -Urine, Clean Catch    Standing Status:   Standing    Number of Occurrences:   1    Specimen Source:   Urine, Clean Catch [[id number]]   Brain natriuretic peptide    Standing Status:   Standing    Number of Occurrences:   1   D-dimer, quantitative    Standing Status:   Standing    Number of Occurrences:   1   CBC    Standing Status:   Standing    Number of Occurrences:   1   Comprehensive metabolic panel    Standing Status:   Standing    Number of Occurrences:   1   T4, free    Standing Status:   Standing    Number of Occurrences:   1   TSH    Standing Status:   Standing    Number of Occurrences:   1   Diet Heart Room service appropriate? Yes; Fluid consistency: Thin    Standing Status:   Standing    Number of Occurrences:   1    Room service appropriate?:   Yes    Fluid consistency::   Thin   Apply TLSO brace    Standing Status:   Standing    Number of Occurrences:   1    may remove brace to shower:   Yes   Maintain TLSO brace    Standing Status:   Standing    Number of Occurrences:   1    may remove brace to shower:   Yes   Maintain IV access    Standing Status:   Standing    Number of Occurrences:   1   Vital signs    Standing Status:   Standing    Number of Occurrences:   1   Notify physician (specify)    Standing Status:   Standing    Number  of Occurrences:   20    Notify Physician:   for pulse less than 55 or greater than 120    Notify Physician:   for respiratory rate less than 12 or greater than 25    Notify Physician:   for temperature greater than 100.5 F    Notify Physician:   for urinary output less than 30 mL/hr for four hours    Notify Physician:   for systolic BP less than 90 or greater than 160, diastolic BP less than 60 or greater than 100    Notify Physician:   for new hypoxia w/ oxygen saturations < 88%   Mobility Protocol: No Restrictions    RN to initiate protocols based on patient's level of care    Standing Status:   Standing    Number of Occurrences:   1   Refer to Sidebar Report Refer to ICU, Med-Surg, Progressive, and Step-Down Mobility Protocol Sidebars    Refer to ICU, Med-Surg, Progressive, and Step-Down Mobility Protocol Sidebars    Standing Status:   Standing    Number of Occurrences:   1   Daily weights    Standing Status:   Standing    Number of Occurrences:   1   Intake and Output    Standing Status:   Standing    Number of Occurrences:   1   Do not place and if present remove PureWick    Standing Status:  Standing    Number of Occurrences:   1   Initiate Oral Care Protocol    Standing Status:   Standing    Number of Occurrences:   1   Initiate Carrier Fluid Protocol    Standing Status:   Standing    Number of Occurrences:   1   RN may order General Admission PRN Orders utilizing "General Admission PRN medications" (through manage orders) for the following patient needs: allergy symptoms (Claritin), cold sores (Carmex), cough (Robitussin DM), eye irritation (Liquifilm Tears), hemorrhoids (Tucks), indigestion (Maalox), minor skin irritation (Hydrocortisone Cream), muscle pain Romeo Apple Gay), nose irritation (saline nasal spray) and sore throat (Chloraseptic spray).    Standing Status:   Standing    Number of Occurrences:   [id number]   Cardiac Monitoring Continuous x 48 hours Indications for use:  Other; Other indications for use: fall    Standing Status:   Standing    Number of Occurrences:   1    Indications for use::   Other    Other indications for use::   fall   Swallow screen    Standing Status:   Standing    Number of Occurrences:   1   Full code    Standing Status:   Standing    Number of Occurrences:   1    By::   Other   Consult to hospitalist    Standing Status:   Standing    Number of Occurrences:   1    Place call to::   [phone]    Reason for Consult:   Admit    Diagnosis/Clinical Info for Consult::   T11-T12 compression fractures, unable to ambulate safely, pain control   Pulse oximetry check with vital signs    Standing Status:   Standing    Number of Occurrences:   1   Oxygen therapy Mode or (Route): Nasal cannula; Liters Per Minute: 2; Keep O2 saturation between: greater than 92 %    Standing Status:   Standing    Number of Occurrences:   20    Mode or (Route):   Nasal cannula    Liters Per Minute:   2    Keep O2 saturation between:   greater than 92 %   Insert peripheral IV    Standing Status:   Standing    Number of Occurrences:   1   Place in observation (patient's expected length of stay will be less than 2 midnights)    Standing Status:   Standing    Number of Occurrences:   1    Hospital Area:   [facility] [[id number]]    Level of Care:   Telemetry Medical [104]    Covid Evaluation:   Asymptomatic - no recent exposure (last 10 days) testing not required    Diagnosis:   Fall [290176]    Admitting Physician:   Darrold Junker    Attending Physician:   Darrold Junker   Aspiration precautions    Standing Status:   Standing    Number of Occurrences:   1   Fall precautions    Standing Status:   Standing    Number of Occurrences:   1    Scheduled Meds:  bicalutamide  50 mg Oral Daily   darolutamide  600 mg Oral BID WC   DULoxetine  20 mg Oral BID   famotidine  20 mg Oral QHS   gabapentin  100 mg Oral QHS  galantamine  16 mg Oral Q breakfast   heparin  5,000 Units Subcutaneous Q12H   lidocaine  1 patch Transdermal Q24H   memantine  10 mg Oral BID   sodium chloride flush  3 mL Intravenous Q12H   tamsulosin  0.4 mg Oral QHS   Continuous Infusions: PRN Meds:.albuterol, fluticasone, ipratropium, morphine injection

## 2023-09-08 NOTE — ED Notes (Signed)
Bed alarm placed.  

## 2023-09-08 NOTE — ED Triage Notes (Addendum)
Pt here via [facility] from home with c/o of back pain from fall last week. Pt states HX of back pain since falls last week.

## 2023-09-08 NOTE — ED Provider Triage Note (Signed)
Emergency Medicine Provider Triage Evaluation Note  Steven Elliott , a 89 y.o. male  was evaluated in triage.  Pt complains of low back pain since fall 10 days ago. Able to walk but very slowly. No vomiting. No headache or neck pain. No blood thinners.  Review of Systems  Positive: Back pain Negative: headache  Physical Exam  Ht 5\' 8"  (1.727 m)   Wt 79 kg   BMI 26.48 kg/m  Gen:   Awake, no distress   Resp:  Normal effort  MSK:   Moves extremities without difficulty  Other:    Medical Decision Making  Medically screening exam initiated at 1:38 PM.  Appropriate orders placed.  Steven Elliott was informed that the remainder of the evaluation will be completed by another provider, this initial triage assessment does not replace that evaluation, and the importance of remaining in the ED until their evaluation is complete.     Jackelyn Hoehn, PA-C 09/08/23 1340

## 2023-09-08 NOTE — ED Notes (Signed)
NA x 2 for flex @ 1556

## 2023-09-08 NOTE — Progress Notes (Signed)
Orthopedic Tech Progress Note Patient Details:  Steven Elliott 1929/11/23 [id number] TLSO Brace has been ordered from [facility]  Patient ID: Steven Elliott, male   DOB: [birth year], 89 y.o.   MRN: [id number]  [name] 09/08/2023, 5:56 PM

## 2023-09-08 NOTE — ED Notes (Signed)
Order placed for TLSO brace

## 2023-09-08 NOTE — ED Provider Notes (Signed)
[place] [facility] Provider Note   CSN: [id number] Arrival date & time: 09/08/23  1300     History  Chief Complaint  Patient presents with   Steven Elliott is a 89 y.o. male with history of dementia, chronic kidney disease, COPD, hyponatremia prostate cancer with metastasis to the bone presents to the emergency department for evaluation of back pain.  Patient fell at his home 08/29/2023, fell backwards and developed acute lower back pain.  He was seen in the ER had initial x-rays that were negative for any acute fracture along the lumbar spine.  No radicular symptoms or weakness.  Pain is midline of his lower back.  He was prescribed tramadol which has not helped with the pain and per daughter, tramadol has caused some hallucinations.  Prior to this fall patient was a minimal ambulator with a walker, he lives at home.  Since the fall he has been needing maximum assistance from daughter to help him shower, walk.  Daughter states currently he is unable to walk.  He has been unable to tolerate the tramadol.  He is allergic to Norco. HPI     Home Medications Prior to Admission medications   Medication Sig Start Date End Date Taking? Authorizing Provider  acetaminophen (TYLENOL) 500 MG tablet Take 500 mg by mouth every 6 (six) hours as needed.    [provider]  albuterol (PROVENTIL HFA;VENTOLIN HFA) 108 (90 Base) MCG/ACT inhaler Inhale 2 puffs into the lungs every 6 (six) hours as needed for wheezing or shortness of breath.    [provider]  bicalutamide (CASODEX) 50 MG tablet Take 50 mg by mouth daily.    [provider]  Calcium Carbonate-Vitamin D (CALCIUM-D) 600-400 MG-UNIT TABS Take 1 tablet by mouth 2 (two) times daily.    [provider]  cefdinir (OMNICEF) 300 MG capsule Take 1 capsule (300 mg total) by mouth 2 (two) times daily. 07/20/22   Ward, Layla Maw, DO  cetirizine (ZYRTEC) 5 MG tablet Take  1 tablet (5 mg total) by mouth daily. 09/13/20   Wallis Bamberg, PA-C  clotrimazole (LOTRIMIN) 1 % cream Apply 1 Application topically 2 (two) times daily. 06/04/22   Vaillancourt, Lelon Mast, PA-C  cyclobenzaprine (FLEXERIL) 5 MG tablet Take 5 mg by mouth 3 (three) times daily as needed for muscle spasms.    [provider]  DULoxetine (CYMBALTA) 20 MG capsule Take 20 mg by mouth 2 (two) times daily.    [provider]  famotidine (PEPCID) 20 MG tablet Take 1 tablet (20 mg total) by mouth at bedtime. 05/22/22   Alford Highland, MD  feeding supplement (ENSURE ENLIVE / ENSURE PLUS) LIQD Take 237 mLs by mouth 3 (three) times daily between meals. 05/22/22   Alford Highland, MD  fluticasone (FLONASE) 50 MCG/ACT nasal spray Place 1 spray into both nostrils daily.    [provider]  gabapentin (NEURONTIN) 100 MG capsule Take 1 capsule (100 mg total) by mouth at bedtime. 05/22/22   Alford Highland, MD  galantamine (RAZADYNE ER) 16 MG 24 hr capsule Take 1 capsule (16 mg total) by mouth daily with breakfast. 05/23/22   Alford Highland, MD  ipratropium (ATROVENT) 0.06 % nasal spray Place 2 sprays into both nostrils 2 (two) times daily.    [provider]  ketoconazole (NIZORAL) 2 % shampoo SHAMPOO AS DIRECTED AFFECTED AREA THREE TIMES A WEEK AS NEEDED (LEAVE ON AFFECTED AREA FOR 10 MINUTES, THEN RINSE OFF  WITH WATER) MAY USE AS A FACE WASH DAILY TO SCALY SPOTS 03/02/22   [provider]  loperamide (IMODIUM) 2 MG capsule Take 1 capsule (2 mg total) by mouth 2 (two) times daily as needed for diarrhea or loose stools. 05/22/22   Alford Highland, MD  magnesium oxide (MAG-OX) 400 (240 Mg) MG tablet Take 400 mg by mouth daily.    [provider]  memantine (NAMENDA) 10 MG tablet Take 10 mg by mouth 2 (two) times daily.    [provider]  Multiple Vitamin (MULTIVITAMIN WITH MINERALS) TABS tablet Take 1 tablet by mouth daily.    [provider]  oxyCODONE  (OXY IR/ROXICODONE) 5 MG immediate release tablet Take 0.5 tablets (2.5 mg total) by mouth every 6 (six) hours as needed for severe pain. 05/22/22   Alford Highland, MD  tamsulosin (FLOMAX) 0.4 MG CAPS capsule Take 0.4 mg by mouth at bedtime.    [provider]  traMADol (ULTRAM) 50 MG tablet Take 1 tablet (50 mg total) by mouth every 6 (six) hours as needed. 08/29/23 08/28/24  Loleta Rose, MD  traZODone (DESYREL) 50 MG tablet Take 1 tablet (50 mg total) by mouth at bedtime. 05/22/22   Alford Highland, MD  vitamin B-12 (CYANOCOBALAMIN) 500 MCG tablet Take 1 tablet by mouth daily. 11/06/21   [provider]      Allergies    Procaine, Ciprofloxacin, Hydrocodone, Hydrocodone-acetaminophen, Lisinopril, Omeprazole, Penicillins, and Rabeprazole    Review of Systems   Review of Systems  Physical Exam Updated Vital Signs BP (!) 143/95 (BP Location: Left Arm)   Pulse (!) 101   Temp 98.5 F (36.9 C)   Resp 15   Ht 5\' 8"  (1.727 m)   Wt 79 kg   SpO2 93%   BMI 26.48 kg/m  Physical Exam Constitutional:      General: He is in acute distress.     Appearance: Normal appearance. He is not ill-appearing or diaphoretic.  HENT:     Head: Normocephalic and atraumatic.     Mouth/Throat:     Pharynx: Oropharynx is clear.  Eyes:     Extraocular Movements: Extraocular movements intact.     Conjunctiva/sclera: Conjunctivae normal.     Pupils: Pupils are equal, round, and reactive to light.  Cardiovascular:     Rate and Rhythm: Normal rate.     Pulses: Normal pulses.  Pulmonary:     Effort: Pulmonary effort is normal. No respiratory distress.     Breath sounds: Normal breath sounds. No wheezing or rales.  Abdominal:     General: Bowel sounds are normal. There is no distension.     Tenderness: There is no abdominal tenderness. There is no guarding.  Musculoskeletal:        General: Normal range of motion.     Comments: Hips move well with internal extra rotation with no discomfort.   Focally tender along the mid spine along the thoracolumbar junction.  No swelling or bruising.  No sacral tenderness.  Nontender along the upper thoracic or cervical spinous process.  Spine range of motion is limited.  He is lying supine in the bed.  Neurological:     General: No focal deficit present.     Mental Status: He is alert. Mental status is at baseline.  Psychiatric:        Mood and Affect: Mood normal.        Thought Content: Thought content normal.        Judgment:  Judgment normal.     ED Results / Procedures / Treatments   Labs (all labs ordered are listed, but only abnormal results are displayed) Labs Reviewed  CBC WITH DIFFERENTIAL/PLATELET - Abnormal; Notable for the following components:      Result Value   RBC 4.01 (*)    Hemoglobin 12.8 (*)    HCT 38.4 (*)    Neutro Abs 8.4 (*)    Abs Immature Granulocytes 0.11 (*)    All other components within normal limits  BASIC METABOLIC PANEL - Abnormal; Notable for the following components:   Sodium 134 (*)    Glucose, Bld 127 (*)    Calcium 8.3 (*)    All other components within normal limits  URINALYSIS, COMPLETE (UACMP) WITH MICROSCOPIC - Abnormal; Notable for the following components:   Color, Urine YELLOW (*)    APPearance HAZY (*)    Ketones, ur 20 (*)    Protein, ur 100 (*)    Bacteria, UA MANY (*)    All other components within normal limits  URINE CULTURE    EKG None  Radiology CT Lumbar Spine Wo Contrast Result Date: 09/08/2023 CLINICAL DATA:  Back trauma, no prior imaging (Age >= 16y). Low back pain since a fall 10 days ago. History of prostate cancer. EXAM: CT LUMBAR SPINE WITHOUT CONTRAST TECHNIQUE: Multidetector CT imaging of the lumbar spine was performed without intravenous contrast administration. Multiplanar CT image reconstructions were also generated. RADIATION DOSE REDUCTION: This exam was performed according to the departmental dose-optimization program which includes automated exposure  control, adjustment of the mA and/or kV according to patient size and/or use of iterative reconstruction technique. COMPARISON:  Lumbar spine radiographs 08/29/2023. CT abdomen and pelvis 05/20/2022. PET-CT 06/22/2023. FINDINGS: Segmentation: 5 lumbar type vertebrae. Alignment: Mild-to-moderate thoracolumbar dextroscoliosis. Trace anterolisthesis of L4 on L5. Vertebrae: Diffuse osteopenia. Acute fractures involving the T11 inferior and T12 superior endplates and vertebral syndesmophytes with at most minimal displacement and no definitive posterior element fracture. Interbody ankylosis from T9-T11 and T12-L2. Partial ankylosis of the SI joints. Sclerotic lesions in the posteromedial right tenth rib, right T11 transverse process, and right L2 transverse process, similar to the prior PET-CT. Paraspinal and other soft tissues: Cholecystectomy. Aortoiliac stent graft. Emphysema. Aortic atherosclerosis. Dependent atelectasis in the lungs, likely with small left and possible trace right pleural effusions. Disc levels: Mild disc degeneration and advanced facet arthrosis in the lower lumbar spine. No evidence of high-grade stenosis. IMPRESSION: 1. Acute, at most minimally displaced fractures through the T11-12 disc space and adjacent vertebral bodies. 2. 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 3. Known small sclerotic bone metastases. 4.  Aortic Atherosclerosis (ICD10-I70.0). Electronically Signed   By: Sebastian Ache M.D.   On: 09/08/2023 16:28    Procedures Procedures    Medications Ordered in ED Medications  lidocaine (LIDODERM) 5 % 1 patch (1 patch Transdermal Patch Applied 09/08/23 1753)  oxyCODONE (Oxy IR/ROXICODONE) immediate release tablet 2.5 mg (2.5 mg Oral Given 09/08/23 1754)  acetaminophen (OFIRMEV) IV 1,000 mg (0 mg Intravenous Stopped 09/08/23 1920)    ED Course/ Medical Decision Making/ A&P                                 Medical Decision Making Amount and/or  Complexity of Data Reviewed Labs: ordered.  Risk Prescription drug management.  89 year old male with history of dementia presents with persistent pain and his lower back  from a fall 1 week ago.  Patient fell backwards onto his back and has had severe back pain.  They have been using Lidoderm patches with little relief.  Tramadol was given last visit and daughter believes this was causing hallucinations.  This medication has been discontinued and patient's mental status back to baseline.  Patient has severe debilitating midline low back pain that limits his mobility and able to live at home alone.  CT scan today shows T11-T12 compression fracture.  Discussed with neurosurgery and TLSO brace was ordered and patient can follow-up outpatient.  Due to pain and mobility issues will admit to the hospital.  Patient's blood work within normal limits.  Patient's urinalysis shows elevated WBCs with bacteria but no leukocytes or nitrites.  Urine culture pending.  Discussed case with hospitalist who is agreeable to admission  Final Clinical Impression(s) / ED Diagnoses Final diagnoses:  Severe back pain  Closed wedge compression fracture of T11 vertebra, initial encounter (HCC)  Compression fracture of T12 vertebra, initial encounter [facility])  Limited mobility    Rx / DC Orders ED Discharge Orders     None         Ronnette Juniper 09/08/23 2212    Merwyn Katos, MD 09/08/23 2240

## 2023-09-08 NOTE — ED Notes (Signed)
Pt stated he needs to use the restroom. Pt currently in recliner and having difficulty standing. This RN and another RN assisted pt up and onto the bed. Pt wearing diaper, will change as needed.

## 2023-09-08 NOTE — ED Notes (Signed)
TSLO brace placed on pt with assistance of tech. Pt had difficulty standing independently. This RN informed provider of inability to ambulate at this time.

## 2023-09-09 ENCOUNTER — Observation Stay: Payer: No Typology Code available for payment source

## 2023-09-09 DIAGNOSIS — E873 Alkalosis: Secondary | ICD-10-CM | POA: Diagnosis not present

## 2023-09-09 DIAGNOSIS — Z87891 Personal history of nicotine dependence: Secondary | ICD-10-CM | POA: Diagnosis not present

## 2023-09-09 DIAGNOSIS — D638 Anemia in other chronic diseases classified elsewhere: Secondary | ICD-10-CM | POA: Diagnosis present

## 2023-09-09 DIAGNOSIS — I671 Cerebral aneurysm, nonruptured: Secondary | ICD-10-CM | POA: Diagnosis present

## 2023-09-09 DIAGNOSIS — N182 Chronic kidney disease, stage 2 (mild): Secondary | ICD-10-CM | POA: Diagnosis present

## 2023-09-09 DIAGNOSIS — S22009A Unspecified fracture of unspecified thoracic vertebra, initial encounter for closed fracture: Secondary | ICD-10-CM | POA: Diagnosis not present

## 2023-09-09 DIAGNOSIS — Z515 Encounter for palliative care: Secondary | ICD-10-CM | POA: Diagnosis not present

## 2023-09-09 DIAGNOSIS — I10 Essential (primary) hypertension: Secondary | ICD-10-CM | POA: Diagnosis present

## 2023-09-09 DIAGNOSIS — D649 Anemia, unspecified: Secondary | ICD-10-CM | POA: Diagnosis present

## 2023-09-09 DIAGNOSIS — S22080A Wedge compression fracture of T11-T12 vertebra, initial encounter for closed fracture: Secondary | ICD-10-CM | POA: Diagnosis present

## 2023-09-09 DIAGNOSIS — N400 Enlarged prostate without lower urinary tract symptoms: Secondary | ICD-10-CM | POA: Diagnosis present

## 2023-09-09 DIAGNOSIS — J690 Pneumonitis due to inhalation of food and vomit: Secondary | ICD-10-CM | POA: Diagnosis not present

## 2023-09-09 DIAGNOSIS — W19XXXA Unspecified fall, initial encounter: Secondary | ICD-10-CM | POA: Diagnosis present

## 2023-09-09 DIAGNOSIS — M549 Dorsalgia, unspecified: Secondary | ICD-10-CM | POA: Diagnosis present

## 2023-09-09 DIAGNOSIS — I739 Peripheral vascular disease, unspecified: Secondary | ICD-10-CM | POA: Diagnosis present

## 2023-09-09 DIAGNOSIS — C7951 Secondary malignant neoplasm of bone: Secondary | ICD-10-CM | POA: Diagnosis present

## 2023-09-09 DIAGNOSIS — K219 Gastro-esophageal reflux disease without esophagitis: Secondary | ICD-10-CM | POA: Diagnosis present

## 2023-09-09 DIAGNOSIS — I951 Orthostatic hypotension: Secondary | ICD-10-CM | POA: Diagnosis present

## 2023-09-09 DIAGNOSIS — R1312 Dysphagia, oropharyngeal phase: Secondary | ICD-10-CM | POA: Diagnosis present

## 2023-09-09 DIAGNOSIS — Y92009 Unspecified place in unspecified non-institutional (private) residence as the place of occurrence of the external cause: Secondary | ICD-10-CM | POA: Diagnosis not present

## 2023-09-09 DIAGNOSIS — F03911 Unspecified dementia, unspecified severity, with agitation: Secondary | ICD-10-CM | POA: Diagnosis present

## 2023-09-09 DIAGNOSIS — J309 Allergic rhinitis, unspecified: Secondary | ICD-10-CM | POA: Diagnosis present

## 2023-09-09 DIAGNOSIS — I129 Hypertensive chronic kidney disease with stage 1 through stage 4 chronic kidney disease, or unspecified chronic kidney disease: Secondary | ICD-10-CM | POA: Diagnosis present

## 2023-09-09 DIAGNOSIS — C61 Malignant neoplasm of prostate: Secondary | ICD-10-CM | POA: Diagnosis present

## 2023-09-09 DIAGNOSIS — M454 Ankylosing spondylitis of thoracic region: Secondary | ICD-10-CM | POA: Diagnosis present

## 2023-09-09 DIAGNOSIS — F0390 Unspecified dementia without behavioral disturbance: Secondary | ICD-10-CM | POA: Diagnosis not present

## 2023-09-09 DIAGNOSIS — F05 Delirium due to known physiological condition: Secondary | ICD-10-CM | POA: Diagnosis not present

## 2023-09-09 DIAGNOSIS — J9601 Acute respiratory failure with hypoxia: Secondary | ICD-10-CM | POA: Diagnosis not present

## 2023-09-09 DIAGNOSIS — Z96652 Presence of left artificial knee joint: Secondary | ICD-10-CM | POA: Diagnosis present

## 2023-09-09 DIAGNOSIS — Z85828 Personal history of other malignant neoplasm of skin: Secondary | ICD-10-CM | POA: Diagnosis not present

## 2023-09-09 DIAGNOSIS — Z79899 Other long term (current) drug therapy: Secondary | ICD-10-CM | POA: Diagnosis not present

## 2023-09-09 LAB — COMPREHENSIVE METABOLIC PANEL
ALT: 17 U/L (ref 0–44)
AST: 26 U/L (ref 15–41)
Albumin: 3.4 g/dL — ABNORMAL LOW (ref 3.5–5.0)
Alkaline Phosphatase: 107 U/L (ref 38–126)
Anion gap: 10 (ref 5–15)
BUN: 13 mg/dL (ref 8–23)
CO2: 23 mmol/L (ref 22–32)
Calcium: 8 mg/dL — ABNORMAL LOW (ref 8.9–10.3)
Chloride: 103 mmol/L (ref 98–111)
Creatinine, Ser: 0.95 mg/dL (ref 0.61–1.24)
GFR, Estimated: >60 mL/min (ref >60)
Glucose, Bld: 109 mg/dL — ABNORMAL HIGH (ref 70–99)
Potassium: 3.6 mmol/L (ref 3.5–5.1)
Sodium: 136 mmol/L (ref 135–145)
Total Bilirubin: 1.7 mg/dL — ABNORMAL HIGH (ref <1.2)
Total Protein: 6.7 g/dL (ref 6.5–8.1)

## 2023-09-09 LAB — CBC
HCT: 37.5 % — ABNORMAL LOW (ref 39.0–52.0)
Hemoglobin: 12.5 g/dL — ABNORMAL LOW (ref 13.0–17.0)
MCH: 32.1 pg (ref 26.0–34.0)
MCHC: 33.3 g/dL (ref 30.0–36.0)
MCV: 96.2 fL (ref 80.0–100.0)
Platelets: 219 10*3/uL (ref 150–400)
RBC: 3.9 MIL/uL — ABNORMAL LOW (ref 4.22–5.81)
RDW: 13.6 % (ref 11.5–15.5)
WBC: 9 10*3/uL (ref 4.0–10.5)
nRBC: 0 % (ref 0.0–0.2)

## 2023-09-09 LAB — LACTIC ACID, PLASMA
Lactic Acid, Venous: 0.9 mmol/L (ref 0.5–1.9)
Lactic Acid, Venous: 0.9 mmol/L (ref 0.5–1.9)

## 2023-09-09 LAB — BRAIN NATRIURETIC PEPTIDE: B Natriuretic Peptide: 252.5 pg/mL — ABNORMAL HIGH (ref 0.0–100.0)

## 2023-09-09 LAB — D-DIMER, QUANTITATIVE: D-Dimer, Quant: 2.21 ug{FEU}/mL — ABNORMAL HIGH (ref 0.00–0.50)

## 2023-09-09 LAB — T4, FREE: Free T4: 1.23 ng/dL — ABNORMAL HIGH (ref 0.61–1.12)

## 2023-09-09 LAB — TROPONIN I (HIGH SENSITIVITY): Troponin I (High Sensitivity): 19 ng/L — ABNORMAL HIGH (ref <18)

## 2023-09-09 LAB — TSH: TSH: 1.46 u[IU]/mL (ref 0.350–4.500)

## 2023-09-09 MED ORDER — DAROLUTAMIDE 300 MG PO TABS
600.0000 mg | ORAL_TABLET | Freq: Two times a day (BID) | ORAL | Status: DC
  Administered 2023-09-10 – 2023-09-27 (×29): 600 mg via ORAL
  Filled 2023-09-09 (×37): qty 2

## 2023-09-09 MED ORDER — MEMANTINE HCL 5 MG PO TABS
10.0000 mg | ORAL_TABLET | Freq: Two times a day (BID) | ORAL | Status: DC
  Administered 2023-09-10 – 2023-09-27 (×32): 10 mg via ORAL
  Filled 2023-09-09 (×2): qty 2
  Filled 2023-09-09: qty 1
  Filled 2023-09-09 (×2): qty 2
  Filled 2023-09-09 (×2): qty 1
  Filled 2023-09-09 (×4): qty 2
  Filled 2023-09-09: qty 1
  Filled 2023-09-09 (×7): qty 2
  Filled 2023-09-09: qty 1
  Filled 2023-09-09 (×8): qty 2
  Filled 2023-09-09: qty 1
  Filled 2023-09-09 (×7): qty 2

## 2023-09-09 MED ORDER — FLUTICASONE PROPIONATE 50 MCG/ACT NA SUSP: 1.0000 | Freq: Every day | NASAL | Status: DC | PRN

## 2023-09-09 MED ORDER — SENNOSIDES-DOCUSATE SODIUM 8.6-50 MG PO TABS
2.0000 | ORAL_TABLET | Freq: Two times a day (BID) | ORAL | Status: DC
  Administered 2023-09-10 – 2023-09-12 (×4): 2 via ORAL
  Filled 2023-09-09 (×4): qty 2

## 2023-09-09 MED ORDER — IPRATROPIUM BROMIDE 0.06 % NA SOLN: 2.0000 | Freq: Two times a day (BID) | NASAL | Status: DC | PRN

## 2023-09-09 MED ORDER — TAMSULOSIN HCL 0.4 MG PO CAPS
0.4000 mg | ORAL_CAPSULE | Freq: Every day | ORAL | Status: DC
  Administered 2023-09-10 – 2023-09-26 (×16): 0.4 mg via ORAL
  Filled 2023-09-09 (×17): qty 1

## 2023-09-09 MED ORDER — POLYETHYLENE GLYCOL 3350 17 G PO PACK
17.0000 g | PACK | Freq: Every day | ORAL | Status: DC
  Administered 2023-09-10 – 2023-09-25 (×13): 17 g via ORAL
  Filled 2023-09-09 (×13): qty 1

## 2023-09-09 MED ORDER — AMLODIPINE BESYLATE 5 MG PO TABS
5.0000 mg | ORAL_TABLET | Freq: Every day | ORAL | Status: DC
  Administered 2023-09-10 – 2023-09-26 (×14): 5 mg via ORAL
  Filled 2023-09-09 (×17): qty 1

## 2023-09-09 MED ORDER — DULOXETINE HCL 20 MG PO CPEP
20.0000 mg | ORAL_CAPSULE | Freq: Two times a day (BID) | ORAL | Status: DC
  Administered 2023-09-10 – 2023-09-27 (×31): 20 mg via ORAL
  Filled 2023-09-09 (×40): qty 1

## 2023-09-09 MED ORDER — GALANTAMINE HYDROBROMIDE ER 8 MG PO CP24
16.0000 mg | ORAL_CAPSULE | Freq: Every day | ORAL | Status: DC
  Administered 2023-09-10 – 2023-09-23 (×13): 16 mg via ORAL
  Filled 2023-09-09 (×16): qty 2

## 2023-09-09 MED ORDER — ALBUTEROL SULFATE (2.5 MG/3ML) 0.083% IN NEBU
2.5000 mg | INHALATION_SOLUTION | Freq: Four times a day (QID) | RESPIRATORY_TRACT | Status: DC | PRN
  Administered 2023-09-13 – 2023-09-14 (×5): 2.5 mg via RESPIRATORY_TRACT
  Filled 2023-09-09 (×5): qty 3

## 2023-09-09 MED ORDER — FAMOTIDINE 20 MG PO TABS
20.0000 mg | ORAL_TABLET | Freq: Every day | ORAL | Status: DC
  Administered 2023-09-10 – 2023-09-25 (×15): 20 mg via ORAL
  Filled 2023-09-09 (×17): qty 1

## 2023-09-09 MED ORDER — IOHEXOL 350 MG/ML SOLN
75.0000 mL | Freq: Once | INTRAVENOUS | Status: AC | PRN
  Administered 2023-09-09: 75 mL via INTRAVENOUS

## 2023-09-09 MED ORDER — GABAPENTIN 100 MG PO CAPS
100.0000 mg | ORAL_CAPSULE | Freq: Every day | ORAL | Status: DC
  Administered 2023-09-10 – 2023-09-25 (×15): 100 mg via ORAL
  Filled 2023-09-09 (×17): qty 1

## 2023-09-09 MED ORDER — METHOCARBAMOL 500 MG PO TABS: 500.0000 mg | ORAL_TABLET | Freq: Four times a day (QID) | ORAL | Status: DC | PRN

## 2023-09-09 MED ORDER — KETOROLAC TROMETHAMINE 15 MG/ML IJ SOLN
15.0000 mg | Freq: Four times a day (QID) | INTRAMUSCULAR | Status: DC | PRN
  Administered 2023-09-10 – 2023-09-13 (×10): 15 mg via INTRAVENOUS
  Filled 2023-09-09 (×10): qty 1

## 2023-09-09 MED ORDER — BICALUTAMIDE 50 MG PO TABS
50.0000 mg | ORAL_TABLET | Freq: Every day | ORAL | Status: DC
  Filled 2023-09-09: qty 1

## 2023-09-09 NOTE — Progress Notes (Signed)
PT Cancellation Note  Patient Details Name: Steven Elliott MRN: [id number] DOB: [birth year]   Cancelled Treatment:    Reason Eval/Treat Not Completed: Fatigue/lethargy limiting ability to participate (Chart reviewed, treatment attempted. DTR at bedside. Pt asleep, does not fully rouse. Does not rouse for long. Returns to sleeping quickly while TLSO is adjusted.) Pt unable to attend to my visit. DTR says he received morphine for pain and has been a sleepy guy since. TLSO unclipped to remove stenal block from neck. DTR made aware of typical TLSO precautions- per her report, brace was donned when she arrived. Will attempt evaluation again at later date/time.    Bryse Blanchette C 09/09/2023, 4:35 PM

## 2023-09-09 NOTE — ED Notes (Signed)
Pt noted to have hard abd. Pt states pain when palpated.

## 2023-09-09 NOTE — Assessment & Plan Note (Addendum)
    Latest Ref Rng & Units 07/20/2022    3:40 AM 05/21/2022    3:41 AM 05/12/2022    4:09 AM  Hepatic Function  Total Protein 6.5 - 8.1 g/dL 8.3  6.9  7.2   Albumin 3.5 - 5.0 g/dL 3.6  2.9  3.3   AST 15 - 41 U/L 28  23  47   ALT 0 - 44 U/L 15  18  54   Alk Phosphatase 38 - 126 U/L 88  90  195   Total Bilirubin 0.3 - 1.2 mg/dL 0.7  1.0  0.7   Bilirubin, Direct 0.0 - 0.2 mg/dL   0.1   LFT added on and pending.

## 2023-09-09 NOTE — ED Notes (Signed)
Pt back from CT

## 2023-09-09 NOTE — Assessment & Plan Note (Addendum)
Today at bedside patient is alert and oriented to self and the year but not knows the president and is disoriented and confused just received morphine for pain. Will do neurochecks and head ct. If negative will consider MRI brain pt may Have IIH.  Cont Cymbalta / galantamine and namenda  After pt has had swallow.

## 2023-09-09 NOTE — ED Notes (Signed)
Patient not able to sit up completely without moaning in pain.  Attempted to get patient to swallow a sip of water with HOB elevated, and patient was not able to seal lips around straw. Holding PO medications for now d/t altered mental status. MD Sherryll Burger aware.

## 2023-09-09 NOTE — Assessment & Plan Note (Signed)
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 Will continue patient on his casodex 2 mg daily.

## 2023-09-09 NOTE — ED Notes (Signed)
Pt to CT

## 2023-09-09 NOTE — Assessment & Plan Note (Addendum)
Patient has history of orthostatic hypotension and will recheck today. Fall precaution. Aspiration precaution.

## 2023-09-09 NOTE — Assessment & Plan Note (Signed)
We will do swallow evaluation.

## 2023-09-09 NOTE — Consult Note (Signed)
Consult requested by:  Dr. Derrill Kay  Consult requested for:  T11-12 fracture  Primary Physician:  Clinic, [facility]  History of Present Illness: 09/09/2023 Steven Elliott suffered a fall onto his back, after which he had significant back pain.  He initially fell about 10 days ago, but had worsening pain.    He is unable to provide history.   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    Anosmia    BPH (benign prostatic hyperplasia)    COPD (chronic obstructive pulmonary disease) (HCC)    Diverticulosis    Emphysema (subcutaneous) (surgical) resulting from a procedure    severe by CT 03/20/16   GERD (gastroesophageal reflux disease)    History of bilateral inguinal hernias 02/2015   Hypertension    Peripheral vascular disease (HCC) 02/2015   heavily calcified but patent bil iliac arteries by CTA 03/20/16.    Past Surgical History: Past Surgical History:  Procedure Laterality Date   APPENDECTOMY     CHOLECYSTECTOMY     COLONOSCOPY  ~ 2007   at [facility]   SKIN CANCER EXCISION     TOTAL KNEE ARTHROPLASTY Left 2014    Allergies: Allergies as of 09/08/2023 - Review Complete 09/08/2023  Allergen Reaction Noted   Procaine Anaphylaxis 03/20/2016   Ciprofloxacin Other (See Comments) and Nausea And Vomiting 03/20/2016   Hydrocodone  09/30/2021   Hydrocodone-acetaminophen Itching 03/20/2016   Lisinopril Other (See Comments) 09/30/2021   Omeprazole Other (See Comments) 03/20/2016   Penicillins Other (See Comments) 03/20/2016   Rabeprazole Other (See Comments) 03/20/2016    Medications: Current Meds  Medication Sig   acetaminophen (TYLENOL) 500 MG tablet Take 500 mg by mouth every 6 (six) hours as needed.   albuterol (PROVENTIL HFA;VENTOLIN HFA) 108 (90  Base) MCG/ACT inhaler Inhale 2 puffs into the lungs every 6 (six) hours as needed for wheezing or shortness of breath.   Calcium Carb-Cholecalciferol 600-10 MG-MCG TABS Take 1 tablet by mouth 2 (two) times daily.   Calcium Carbonate-Vitamin D (CALCIUM-D) 600-400 MG-UNIT TABS Take 1 tablet by mouth 2 (two) times daily.   cyclobenzaprine (FLEXERIL) 5 MG tablet Take 5 mg by mouth 3 (three) times daily as needed for muscle spasms.   darolutamide (NUBEQA) 300 MG tablet Take 600 mg by mouth 2 (two) times daily with a meal.   DULoxetine (CYMBALTA) 20 MG capsule Take 20 mg by mouth 2 (two) times daily.   famotidine (PEPCID) 20 MG tablet Take 1 tablet (20 mg total) by mouth at bedtime.   fluticasone (FLONASE) 50 MCG/ACT nasal spray Place 1 spray into both nostrils daily.   gabapentin (NEURONTIN) 100 MG capsule Take 1 capsule (100 mg total) by mouth at bedtime.   galantamine (RAZADYNE ER) 16 MG 24 hr capsule Take 1 capsule (16 mg total) by mouth daily with breakfast.   ipratropium (ATROVENT) 0.06 % nasal spray Place 2 sprays into both nostrils 2 (two) times daily.   magnesium oxide (MAG-OX) 400 (240 Mg) MG tablet Take 400 mg by mouth daily.   memantine (NAMENDA) 10 MG tablet Take 10 mg by mouth 2 (two) times daily.   Multiple Vitamin (MULTIVITAMIN WITH MINERALS) TABS tablet Take 1 tablet by mouth daily.   olopatadine (PATANOL) 0.1 % ophthalmic solution Place 1  drop into both eyes 2 (two) times daily.   oxyCODONE (OXY IR/ROXICODONE) 5 MG immediate release tablet Take 0.5 tablets (2.5 mg total) by mouth every 6 (six) hours as needed for severe pain.   tamsulosin (FLOMAX) 0.4 MG CAPS capsule Take 0.4 mg by mouth at bedtime.   traMADol (ULTRAM) 50 MG tablet Take 1 tablet (50 mg total) by mouth every 6 (six) hours as needed.   traZODone (DESYREL) 50 MG tablet Take 1 tablet (50 mg total) by mouth at bedtime.   vitamin B-12 (CYANOCOBALAMIN) 500 MCG tablet Take 1 tablet by mouth daily.    Social History: Social  History   Tobacco Use   Smoking status: Former    Types: Cigarettes, Cigars   Smokeless tobacco: Never  Vaping Use   Vaping status: Never Used  Substance Use Topics   Alcohol use: Yes    Comment: occ   Drug use: No    Family Medical History: Family History  Problem Relation Age of Onset   Breast cancer Mother    Pancreatitis Mother    Breast cancer Sister     Physical Examination: Vitals:   09/09/23 1200 09/09/23 1615  BP: 132/70   Pulse: (!) 103   Resp: (!) 25   Temp:  98.3 F (36.8 C)  SpO2: 96%     General: Patient is in no apparent distress.   He is asleep and does not interact.  He moved all limbs while I was present, but not cooperative with examination.   Medical Decision Making  Imaging: CT TL spine 09/08/2023 FINDINGS: Segmentation: 5 lumbar type vertebrae.   Alignment: Mild-to-moderate thoracolumbar dextroscoliosis. Trace anterolisthesis of L4 on L5.   Vertebrae: Diffuse osteopenia. Acute fractures involving the T11 inferior and T12 superior endplates and vertebral syndesmophytes with at most minimal displacement and no definitive posterior element fracture. Interbody ankylosis from T9-T11 and T12-L2. Partial ankylosis of the SI joints. Sclerotic lesions in the posteromedial right tenth rib, right T11 transverse process, and right L2 transverse process, similar to the prior PET-CT.   Paraspinal and other soft tissues: Cholecystectomy. Aortoiliac stent graft. Emphysema. Aortic atherosclerosis. Dependent atelectasis in the lungs, likely with small left and possible trace right pleural effusions.   Disc levels: Mild disc degeneration and advanced facet arthrosis in the lower lumbar spine. No evidence of high-grade stenosis.   IMPRESSION: 1. Acute, at most minimally displaced fractures through the T11-12 disc space and adjacent vertebral bodies. 2. 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 3. Known small sclerotic  bone metastases. 4.  Aortic Atherosclerosis (ICD10-I70.0).     Electronically Signed   By: Sebastian Ache M.D.   On: 09/08/2023 16:28  I have personally reviewed the images and agree with the above interpretation.  Assessment and Plan: Steven Elliott is a pleasant 89 y.o. male with T11-12 fractures in the setting of either DISH or ankylosing spondylitis.  His posterior elements and the posterior portion of his syndesmoses at T11-12 are intact.    - TLSO when OOB - Follow up outpatient - PTOT - Pain control  I have communicated my recommendations to the requesting physician and coordinated care to facilitate these recommendations.     Mirca Yale K. Myer Haff MD, [facility]

## 2023-09-09 NOTE — Assessment & Plan Note (Signed)
    Latest Ref Rng & Units 09/08/2023    1:42 PM 04/18/2023    9:38 PM 07/20/2022    3:40 AM  CBC  WBC 4.0 - 10.5 K/uL 9.9  9.5  11.0   Hemoglobin 13.0 - 17.0 g/dL 16.1  09.6  04.5   Hematocrit 39.0 - 52.0 % 38.4  44.9  39.3   Platelets 150 - 400 K/uL 217  219  207   Stable will follow pt has h/o Rectal bleeding.

## 2023-09-09 NOTE — ED Notes (Signed)
Patient's family member updated over the phone.

## 2023-09-09 NOTE — Assessment & Plan Note (Addendum)
Currently stable we will follow and renally dose needed antibiotics. Lab Results  Component Value Date   CREATININE 1.00 09/08/2023   CREATININE 1.00 04/18/2023   CREATININE 1.03 07/20/2022

## 2023-09-09 NOTE — TOC Initial Note (Signed)
Transition of Care [facility]) - Initial/Assessment Note    Patient Details  Name: Steven Elliott MRN: [id number] Date of Birth: [birth year]  Transition [facility] CM/SW Contact:    Marquita Palms, LCSW Phone Number: 09/09/2023, 1:40 PM  Clinical Narrative:                  CSW met with patients daughter and miniser nedside. Daughter wanted to know if I was the "one" that was suppose to come and talk to her about palliative care. Patients daughter reported that they asked for someone "a long time ago." CSW secure chatted a message to [facility] for a consult.        Patient Goals and CMS Choice            Expected Discharge Plan and Services                                              Prior Living Arrangements/Services                       Activities of Daily Living      Permission Sought/Granted                  Emotional Assessment              Admission diagnosis:  Fall [W19.XXXA] Patient Active Problem List   Diagnosis Date Noted   Cerebral aneurysm, nonruptured 09/09/2023   Essential (primary) hypertension 09/09/2023   Oropharyngeal dysphagia 09/09/2023   Anemia 09/09/2023   Fracture dislocation of thoracic spine, closed, initial encounter (HCC) 09/09/2023   Fall 09/08/2023   Orthostatic hypotension 05/21/2022   Wheeze 05/21/2022   Hyponatremia 05/21/2022   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/20/2022   Acute urinary retention 05/20/2022   Urethritis 05/11/2022   Balanitis 05/10/2022   UTI (urinary tract infection) 05/10/2022   BPH (benign prostatic hyperplasia)    Dementia (HCC)    Abnormal LFTs    COPD (chronic obstructive pulmonary disease) (HCC)    Chronic kidney disease, stage 3a (HCC)    Pneumoperitoneum    Diverticular hemorrhage 03/22/2016   Abdominal aortic aneurysm (HCC) 03/22/2016   Rectal bleeding 03/20/2016   PCP:  Clinic, [facility] Pharmacy:   [id number] -  [place], [place] - [address] AT [facility] [address] [place] [address] Phone: [phone] Fax: [phone]  [facility] - [place], [place] - [address] [place] [address] Phone: [phone] Fax: [phone]  [facility] [address] ([address]), [place] - [address] [place] ([place]) [place] [address] Phone: [phone] Fax: [phone]  [facility] [address] [place] - [address] [place] [address] Phone: [phone] Fax: [phone]  [facility] - [place], [place] - [address] [place] [address] Phone: [phone] Fax: [phone]  [id number] - [place], [place] - [address] AT [facility] [address] [facility] [address] [place] [address] Phone: [phone] Fax: [phone]     Social Drivers of Health (SDOH) Social History: SDOH Screenings   Tobacco Use: Medium Risk (09/08/2023)   SDOH Interventions:     Readmission Risk Interventions  No data to display

## 2023-09-09 NOTE — Assessment & Plan Note (Signed)
Lab Results  Component Value Date   CREATININE 1.00 09/08/2023   CREATININE 1.00 04/18/2023   CREATININE 1.03 07/20/2022  Patient has history of AKI currently stable and resolved.

## 2023-09-09 NOTE — Progress Notes (Signed)
       CROSS COVER NOTE  NAME: Steven Elliott MRN: [id number] DOB : [birth year]    Concern as stated by nurse / staff   Page from [facility] radiology a lot of abdominal pneumotosis present     Pertinent findings on chart review: Patient admitted with fall resulting in T11-12 fx. Also has metastatic prostate cancer to bone   Assessment and  Interventions   Assessment:  Plan: CT abd WO contrast to follow up pneumotosis       Donnie Mesa NP [facility] [employer] 7pm-7am - check amion for availability Pager [phone]

## 2023-09-09 NOTE — ED Notes (Signed)
Condom cath applied with drainage bag at this time.

## 2023-09-09 NOTE — Assessment & Plan Note (Signed)
Pt coming with back pain after fall and found to have fracture of T11-T12 and now in TLSO brace her for pain control outpatient appt with NS.  D/D include Fall VS syncope.

## 2023-09-09 NOTE — ED Notes (Addendum)
ED TO INPATIENT HANDOFF REPORT  ED Nurse Name and Phone #: [phone]  S Name/Age/Gender Steven Elliott 89 y.o. male Room/Bed: ED34A/ED34A  Code Status   Code Status: Full Code  Home/SNF/Other SNF Patient oriented to: self Is this baseline? No  Triage Complete: Triage complete  Chief Complaint Fall [W19.XXXA]  Triage Note Pt here via [facility] from home with c/o of back pain from fall last week. Pt states HX of back pain since falls last week.        Allergies Allergies  Allergen Reactions   Procaine Anaphylaxis   Ciprofloxacin Other (See Comments) and Nausea And Vomiting    Malaise, Rigor   Hydrocodone     Other reaction(s): Rash, Hives, Heart palpitations ()   Hydrocodone-Acetaminophen Itching    Watery eyes   Lisinopril Other (See Comments)   Omeprazole Other (See Comments)    GI reaction   Penicillins Other (See Comments)   Rabeprazole Other (See Comments)    GI reaction    Level of Care/Admitting Diagnosis ED Disposition     ED Disposition  Admit   Condition  --   Comment  Hospital Area: [facility] [[id number]]  Level of Care: Telemetry Medical [104]  Covid Evaluation: Asymptomatic - no recent exposure (last 10 days) testing not required  Diagnosis: Fall [[id number]]  Admitting Physician: Darrold Junker  Attending Physician: Delfino Lovett [[id number]]  Certification:: I certify this patient will need inpatient services for at least 2 midnights  Expected Medical Readiness: 09/10/2023          B Medical/Surgery History Past Medical History:  Diagnosis Date   AKI (acute kidney injury) (HCC) 05/21/2022   Allergic rhinitis    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    Anosmia    BPH (benign prostatic hyperplasia)    COPD (chronic obstructive pulmonary disease) (HCC)    Diverticulosis    Emphysema (subcutaneous) (surgical) resulting from a procedure    severe by CT 03/20/16   GERD  (gastroesophageal reflux disease)    History of bilateral inguinal hernias 02/2015   Hypertension    Peripheral vascular disease (HCC) 02/2015   heavily calcified but patent bil iliac arteries by CTA 03/20/16.   Past Surgical History:  Procedure Laterality Date   APPENDECTOMY     CHOLECYSTECTOMY     COLONOSCOPY  ~ 2007   at [facility]   SKIN CANCER EXCISION     TOTAL KNEE ARTHROPLASTY Left 2014     A IV Location/Drains/Wounds Patient Lines/Drains/Airways Status     Active Line/Drains/Airways     Name Placement date Placement time Site Days   Peripheral IV 09/08/23 20 G 0.75" Anterior;Right Hand 09/08/23  1705  Hand  1            Intake/Output Last 24 hours  Intake/Output Summary (Last 24 hours) at 09/09/2023 1500 Last data filed at 09/08/2023 1920 Gross per 24 hour  Intake 100 ml  Output --  Net 100 ml    Labs/Imaging Results for orders placed or performed during the hospital encounter of 09/08/23 (from the past 48 hours)  CBC with Differential     Status: Abnormal   Collection Time: 09/08/23  1:42 PM  Result Value Ref Range   WBC 9.9 4.0 - 10.5 K/uL   RBC 4.01 (L) 4.22 - 5.81 MIL/uL   Hemoglobin 12.8 (L) 13.0 - 17.0 g/dL   HCT 56.2 (L) 13.0 - 86.5 %  MCV 95.8 80.0 - 100.0 fL   MCH 31.9 26.0 - 34.0 pg   MCHC 33.3 30.0 - 36.0 g/dL   RDW 65.7 84.6 - 96.2 %   Platelets 217 150 - 400 K/uL   nRBC 0.0 0.0 - 0.2 %   Neutrophils Relative % 84 %   Neutro Abs 8.4 (H) 1.7 - 7.7 K/uL   Lymphocytes Relative 9 %   Lymphs Abs 0.8 0.7 - 4.0 K/uL   Monocytes Relative 6 %   Monocytes Absolute 0.6 0.1 - 1.0 K/uL   Eosinophils Relative 0 %   Eosinophils Absolute 0.0 0.0 - 0.5 K/uL   Basophils Relative 0 %   Basophils Absolute 0.0 0.0 - 0.1 K/uL   Immature Granulocytes 1 %   Abs Immature Granulocytes 0.11 (H) 0.00 - 0.07 K/uL    Comment: Performed at [facility], [address] [place], [place] [address]  Basic metabolic panel     Status: Abnormal    Collection Time: 09/08/23  5:17 PM  Result Value Ref Range   Sodium 134 (L) 135 - 145 mmol/L   Potassium 3.9 3.5 - 5.1 mmol/L   Chloride 102 98 - 111 mmol/L   CO2 23 22 - 32 mmol/L   Glucose, Bld 127 (H) 70 - 99 mg/dL    Comment: Glucose reference range applies only to samples taken after fasting for at least 8 hours.   BUN 15 8 - 23 mg/dL   Creatinine, Ser 1.32 0.61 - 1.24 mg/dL   Calcium 8.3 (L) 8.9 - 10.3 mg/dL   GFR, Estimated >44 >01 mL/min    Comment: (NOTE) Calculated using the CKD-EPI Creatinine Equation (2021)    Anion gap 9 5 - 15    Comment: Performed at [facility], [address] [place], [place] [address]  Urinalysis, Complete w Microscopic -Urine, Clean Catch     Status: Abnormal   Collection Time: 09/08/23  6:04 PM  Result Value Ref Range   Color, Urine YELLOW (A) YELLOW   APPearance HAZY (A) CLEAR   Specific Gravity, Urine 1.019 1.005 - 1.030   pH 5.0 5.0 - 8.0   Glucose, UA NEGATIVE NEGATIVE mg/dL   Hgb urine dipstick NEGATIVE NEGATIVE   Bilirubin Urine NEGATIVE NEGATIVE   Ketones, ur 20 (A) NEGATIVE mg/dL   Protein, ur 366 (A) NEGATIVE mg/dL   Nitrite NEGATIVE NEGATIVE   Leukocytes,Ua NEGATIVE NEGATIVE   RBC / HPF 0-5 0 - 5 RBC/hpf   WBC, UA 11-20 0 - 5 WBC/hpf   Bacteria, UA MANY (A) NONE SEEN   Squamous Epithelial / HPF 0-5 0 - 5 /HPF   Mucus PRESENT     Comment: Performed at [facility], [address] [place], [place] [address]  D-dimer, quantitative     Status: Abnormal   Collection Time: 09/09/23  3:20 AM  Result Value Ref Range   D-Dimer, Quant 2.21 (H) 0.00 - 0.50 ug/mL-FEU    Comment: (NOTE) At the manufacturer cut-off value of 0.5 g/mL FEU, this assay has a negative predictive value of 95-100%.This assay is intended for use in conjunction with a clinical pretest probability (PTP) assessment model to exclude pulmonary embolism (PE) and deep venous thrombosis (DVT) in outpatients suspected of PE or DVT. Results should  be correlated with clinical presentation. Performed at [facility] [address] [place], [place] [address]   Lactic acid, plasma     Status: None   Collection Time: 09/09/23  6:46 AM  Result Value Ref Range   Lactic Acid, Venous 0.9 0.5 - 1.9 mmol/L    Comment: Performed at [facility], [address] [place], [place] [address]  Brain natriuretic peptide     Status: Abnormal   Collection Time: 09/09/23 10:03 AM  Result Value Ref Range   B Natriuretic Peptide 252.5 (H) 0.0 - 100.0 pg/mL    Comment: Performed at [facility], [address] [place], [place] [address]  CBC     Status: Abnormal   Collection Time: 09/09/23 10:03 AM  Result Value Ref Range   WBC 9.0 4.0 - 10.5 K/uL   RBC 3.90 (L) 4.22 - 5.81 MIL/uL   Hemoglobin 12.5 (L) 13.0 - 17.0 g/dL   HCT 69.6 (L) 29.5 - 28.4 %   MCV 96.2 80.0 - 100.0 fL   MCH 32.1 26.0 - 34.0 pg   MCHC 33.3 30.0 - 36.0 g/dL   RDW 13.2 44.0 - 10.2 %   Platelets 219 150 - 400 K/uL   nRBC 0.0 0.0 - 0.2 %    Comment: Performed at [facility], [address] [place], [place] [address]  Comprehensive metabolic panel     Status: Abnormal   Collection Time: 09/09/23 10:03 AM  Result Value Ref Range   Sodium 136 135 - 145 mmol/L   Potassium 3.6 3.5 - 5.1 mmol/L   Chloride 103 98 - 111 mmol/L   CO2 23 22 - 32 mmol/L   Glucose, Bld 109 (H) 70 - 99 mg/dL    Comment: Glucose reference range applies only to samples taken after fasting for at least 8 hours.   BUN 13 8 - 23 mg/dL   Creatinine, Ser 6.44 0.61 - 1.24 mg/dL   Calcium 8.0 (L) 8.9 - 10.3 mg/dL   Total Protein 6.7 6.5 - 8.1 g/dL   Albumin 3.4 (L) 3.5 - 5.0 g/dL   AST 26 15 - 41 U/L   ALT 17 0 - 44 U/L   Alkaline Phosphatase 107 38 - 126 U/L   Total Bilirubin 1.7 (H) <1.2 mg/dL   GFR, Estimated >03 >47 mL/min    Comment: (NOTE) Calculated using the CKD-EPI Creatinine Equation (2021)    Anion gap 10 5 - 15    Comment: Performed at [facility], [address], [place], [place] [address]  Troponin I (High Sensitivity)     Status: Abnormal   Collection Time: 09/09/23 10:03 AM  Result Value Ref Range   Troponin I (High Sensitivity) 19 (H) <18 ng/L    Comment: (NOTE) Elevated high sensitivity troponin I (hsTnI) values and significant  changes across serial measurements may suggest ACS but many other  chronic and acute conditions are known to elevate hsTnI results.  Refer to the "Links" section for chest pain algorithms and additional  guidance. Performed at [facility], [address] [place], [place] [address]   T4, free     Status: Abnormal   Collection Time: 09/09/23 10:03 AM  Result Value Ref Range   Free T4 1.23 (H) 0.61 - 1.12 ng/dL    Comment: (NOTE) Biotin ingestion may interfere with free T4 tests. If the results are inconsistent with the TSH level, previous test results, or the clinical presentation, then consider biotin interference. If needed, order repeat testing after stopping biotin. Performed at [facility], [address] [place], [place] [address]   TSH     Status: None   Collection Time: 09/09/23 10:03 AM  Result Value Ref Range   TSH 1.460 0.350 - 4.500 uIU/mL    Comment: Performed by a 3rd Generation assay with a functional sensitivity of <=0.01 uIU/mL. Performed at [facility], [address] [place], [place] [address]   Lactic acid, plasma     Status: None   Collection Time: 09/09/23 10:03 AM  Result Value Ref Range   Lactic Acid, Venous 0.9 0.5 - 1.9 mmol/L    Comment: Performed at [facility], [address] [place], [place] [address]   CT ABDOMEN PELVIS WO CONTRAST Addendum Date: 09/09/2023 ADDENDUM REPORT: 09/09/2023 11:09 ADDENDUM: Critical Value/emergent results were called by telephone at the time of interpretation on 09/09/2023 at 11:09 am to provider Delfino Lovett, MD, who verbally acknowledged these results. Electronically  Signed   By: Signa Kell M.D.   On: 09/09/2023 11:09   Result Date: 09/09/2023 CLINICAL DATA:  Abdominal trauma. Status post fall. Pneumatosis identified on CT. History of prostate cancer. EXAM: CT ABDOMEN AND PELVIS WITHOUT CONTRAST TECHNIQUE: Multidetector CT imaging of the abdomen and pelvis was performed following the standard protocol without IV contrast. RADIATION DOSE REDUCTION: This exam was performed according to the departmental dose-optimization program which includes automated exposure control, adjustment of the mA and/or kV according to patient size and/or use of iterative reconstruction technique. COMPARISON:  PET-CT 06/22/2023 FINDINGS: Lower chest: Emphysematous changes. Dependent atelectasis in volume loss noted within the lung bases. Hepatobiliary: Low-density structure within segment 3 measures 1 cm. This is unchanged from 04/23/2022 and is favored to represent a benign abnormality. Status post cholecystectomy. Common bile duct measures 1 cm and is unchanged from 05/10/2022. In the absence of clinical signs/symptoms of biliary obstruction this most likely reflects post cholecystectomy physiology Pancreas: Unremarkable. No pancreatic ductal dilatation or surrounding inflammatory changes. Spleen: Normal in size without focal abnormality. Adrenals/Urinary Tract: Normal adrenal glands. No signs of obstructive uropathy. No kidney mass identified. Cyst off the lateral cortex of left kidney measures 1 cm, image 34/8. No specific follow-up recommendations. Bladder wall trabeculation with multiple intramural diverticula identified are favored to represent sequelae of chronic bladder outlet obstruction. Stomach/Bowel: The assessment of bowel pathology is diminished due to lack of IV and enteric contrast material. Stomach appears nondistended. Within the left upper quadrant of the abdomen there are several loops of small bowel which are increased in caliber measuring up to 3.7 cm. Normal caliber mid  and distal small bowel loops. No abnormal colonic dilatation. Colonic diverticulosis is most severe at the level of the sigmoid colon. There is no sign of acute diverticulitis. 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 In retrospect this was present on the PET-CT dated 06/22/2023. No sign portal venous gas. There is no abnormal small bowel wall thickening or surrounding inflammatory fat stranding. Vascular/Lymphatic: Extensive aortic atherosclerosis. Status post stent graft repair abdominal aortic aneurysm. Aneurysm sac measures 4.5 x 3.6 cm, image 38/3. Unchanged from 06/22/2023. No enlarged abdominopelvic lymph nodes. Reproductive: Unchanged appearance of the prostate gland compared with 06/22/2023. Other: No convincing evidence for pneumoperitoneum. No significant free fluid or fluid collections. Large bilateral fat containing inguinal hernias. Musculoskeletal: Signs of ankylosing spondylitis with diffuse ankylosis of the thoracic and upper lumbar spine. Rigid spine fracture across the T11-T12 level as described on lumbar spine CT from 09/08/23. 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 IMPRESSION: 1. 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 In retrospect  this was present on the PET-CT dated 06/22/2023. There is no abnormal small bowel wall thickening or surrounding inflammatory fat stranding. No sign of portal venous gas. Absence of significant findings like bowel wall thickening, inflammation, free fluid, and pneumoperitoneum suggests that the pneumatosis is not associated with an acute or severe pathological process like acute ischemia, perforation, or infection. Findings may be related to factors such as benign small bowel pneumatosis, which may be related to factors such as chronic mechanical stress (constipation), subclinical mesenteric ischemia, vascular changes from AAA repair,  or radiation effects (if applicable). Advise close clinical monitoring and follow-up imaging (CT mesenteric angiogram) if there are clinical concerns for mesenteric ischemia 2. Within the left upper quadrant of the abdomen there are several loops of small bowel which are increased in caliber measuring up to 3.7 cm with normal caliber mid and distal small bowel loops. Findings are favored to represent ileus. 3. Status post stent graft repair of abdominal aortic aneurysm. Aneurysm sac measures 4.5 x 3.6 cm. Unchanged from 06/22/2023. 4. 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 Rigid spine fracture across the T11-T12 level as described on lumbar spine CT from 09/08/23. 5. 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 6. Aortic Atherosclerosis (ICD10-I70.0) and Emphysema (ICD10-J43.9). Electronically Signed: By: Signa Kell M.D. On: 09/09/2023 08:48   CT Angio Chest Pulmonary Embolism (PE) W or WO Contrast Result Date: 09/09/2023 CLINICAL DATA:  Syncope/presyncope with cerebrovascular cause suspected. EXAM: CT ANGIOGRAPHY CHEST WITH CONTRAST TECHNIQUE: Multidetector CT imaging of the chest was performed using the standard protocol during bolus administration of intravenous contrast. Multiplanar CT image reconstructions and MIPs were obtained to evaluate the vascular anatomy. RADIATION DOSE REDUCTION: This exam was performed according to the departmental dose-optimization program which includes automated exposure control, adjustment of the mA and/or kV according to patient size and/or use of iterative reconstruction technique. CONTRAST:  75mL OMNIPAQUE IOHEXOL 350 MG/ML SOLN COMPARISON:  None Available. FINDINGS: Cardiovascular: Satisfactory opacification of the pulmonary arteries to the segmental level. No evidence of pulmonary embolism. Normal heart size. No pericardial effusion.Atheromatous calcification of the aorta and  coronaries. Mediastinum/Nodes: No mass or adenopathy. Lungs/Pleura: Centrilobular and panlobular emphysema. Dependent atelectasis with subpleural opacification and volume loss. Upper Abdomen: Extensive pneumatosis around epigastric bowel loops Musculoskeletal: Ankylosing spondylitis with diffuse ankylosis. Rigid spine fracture across the T11-12 level following the disc space, reference dedicated imaging from yesterday. No interval displacement Review of the MIP images confirms the above findings. Critical Value/emergent results were called by telephone at the time of interpretation on 09/09/2023 at 5:25 am to provider Jon Billings, who verbally acknowledged these results. IMPRESSION: 1. Extensive pneumatosis affecting epigastric bowel loops, partial coverage with cause not clearly identified, recommend abdominal exam/dedicated imaging. 2. Negative for pulmonary embolism.  Dependent atelectasis. 3. Known rigid spine fracture at T11-12 without interval displacement. Electronically Signed   By: Tiburcio Pea M.D.   On: 09/09/2023 05:26   CT HEAD WO CONTRAST ( ) Result Date: 09/09/2023 CLINICAL DATA:  Syncope/presyncope with cerebrovascular cause suspected EXAM: CT HEAD WITHOUT CONTRAST TECHNIQUE: Contiguous axial images were obtained from the base of the skull through the vertex without intravenous contrast. RADIATION DOSE REDUCTION: This exam was performed according to the departmental dose-optimization program which includes automated exposure control, adjustment of the mA and/or kV according to patient size and/or use of iterative reconstruction technique. COMPARISON:  04/18/2023 FINDINGS: Brain: No evidence of acute infarction, hemorrhage, hydrocephalus, extra-axial collection or mass  lesion/mass effect. Generalized atrophy. Vascular: Right MCA bifurcation aneurysm with heavy mural calcification, ~ 1 cm on sagittal reformats. Skull: Normal. Negative for fracture or focal lesion. Sinuses/Orbits: No acute  finding. IMPRESSION: 1. No acute finding. 2. Atrophy, atherosclerosis, and long-standing right MCA aneurysm (seen since at least 2005). Electronically Signed   By: Tiburcio Pea M.D.   On: 09/09/2023 04:35   CT Lumbar Spine Wo Contrast Result Date: 09/08/2023 CLINICAL DATA:  Back trauma, no prior imaging (Age >= 16y). Low back pain since a fall 10 days ago. History of prostate cancer. EXAM: CT LUMBAR SPINE WITHOUT CONTRAST TECHNIQUE: Multidetector CT imaging of the lumbar spine was performed without intravenous contrast administration. Multiplanar CT image reconstructions were also generated. RADIATION DOSE REDUCTION: This exam was performed according to the departmental dose-optimization program which includes automated exposure control, adjustment of the mA and/or kV according to patient size and/or use of iterative reconstruction technique. COMPARISON:  Lumbar spine radiographs 08/29/2023. CT abdomen and pelvis 05/20/2022. PET-CT 06/22/2023. FINDINGS: Segmentation: 5 lumbar type vertebrae. Alignment: Mild-to-moderate thoracolumbar dextroscoliosis. Trace anterolisthesis of L4 on L5. Vertebrae: Diffuse osteopenia. Acute fractures involving the T11 inferior and T12 superior endplates and vertebral syndesmophytes with at most minimal displacement and no definitive posterior element fracture. Interbody ankylosis from T9-T11 and T12-L2. Partial ankylosis of the SI joints. Sclerotic lesions in the posteromedial right tenth rib, right T11 transverse process, and right L2 transverse process, similar to the prior PET-CT. Paraspinal and other soft tissues: Cholecystectomy. Aortoiliac stent graft. Emphysema. Aortic atherosclerosis. Dependent atelectasis in the lungs, likely with small left and possible trace right pleural effusions. Disc levels: Mild disc degeneration and advanced facet arthrosis in the lower lumbar spine. No evidence of high-grade stenosis. IMPRESSION: 1. Acute, at most minimally displaced fractures  through the T11-12 disc space and adjacent vertebral bodies. 2. 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 3. Known small sclerotic bone metastases. 4.  Aortic Atherosclerosis (ICD10-I70.0). Electronically Signed   By: Sebastian Ache M.D.   On: 09/08/2023 16:28    Pending Labs Unresulted Labs (From admission, onward)     Start     Ordered   09/08/23 2017  Urine Culture  Add-on,   AD       Question:  Indication  Answer:  Dysuria   09/08/23 2017            Vitals/Pain Today's Vitals   09/09/23 1000 09/09/23 1039 09/09/23 1100 09/09/23 1200  BP: (!) 144/90  139/89 132/70  Pulse: 99  (!) 102 (!) 103  Resp: 20  18 (!) 25  Temp:  97.9 F (36.6 C)    TempSrc:  Axillary    SpO2: 95%  98% 96%  Weight:      Height:      PainSc:        Isolation Precautions No active isolations  Medications Medications  lidocaine (LIDODERM) 5 % 1 patch (1 patch Transdermal Patch Removed 09/09/23 0652)  sodium chloride flush (NS) 0.9 % injection 3 mL (3 mLs Intravenous Given 09/09/23 0950)  heparin injection 5,000 Units (5,000 Units Subcutaneous Given 09/09/23 0951)  darolutamide (NUBEQA) tablet 600 mg (0 mg Oral Hold 09/09/23 0929)  tamsulosin (FLOMAX) capsule 0.4 mg (0.4 mg Oral Not Given 09/09/23 0332)  memantine (NAMENDA) tablet 10 mg (0 mg Oral Hold 09/09/23 0929)  ipratropium (ATROVENT) 0.06 % nasal spray 2 spray (has no administration in time range)  galantamine (RAZADYNE ER) 24 hr capsule 16 mg (0 mg Oral Hold  09/09/23 0929)  gabapentin (NEURONTIN) capsule 100 mg (100 mg Oral Not Given 09/09/23 0332)  fluticasone (FLONASE) 50 MCG/ACT nasal spray 1 spray (has no administration in time range)  DULoxetine (CYMBALTA) DR capsule 20 mg (0 mg Oral Hold 09/09/23 0929)  famotidine (PEPCID) tablet 20 mg (20 mg Oral Not Given 09/09/23 0333)  albuterol (PROVENTIL) (2.5 MG/3ML) 0.083% nebulizer solution 2.5 mg (has no administration in time range)  bicalutamide (CASODEX)  tablet 50 mg (0 mg Oral Hold 09/09/23 0929)  ketorolac (TORADOL) 15 MG/ML injection 15 mg (has no administration in time range)  methocarbamol (ROBAXIN) tablet 500 mg (has no administration in time range)  oxyCODONE (Oxy IR/ROXICODONE) immediate release tablet 2.5 mg (2.5 mg Oral Given 09/08/23 1754)  acetaminophen (OFIRMEV) IV 1,000 mg (0 mg Intravenous Stopped 09/08/23 1920)  0.9 %  sodium chloride infusion ( Intravenous New Bag/Given 09/09/23 0039)  iohexol (OMNIPAQUE) 350 MG/ML injection 75 mL (75 mLs Intravenous Contrast Given 09/09/23 0403)    Mobility walks with person assist       R Recommendations: See Admitting Provider Note  Report given to:   Additional Notes:

## 2023-09-09 NOTE — Assessment & Plan Note (Addendum)
Fall precautions.  Suspect presyncope or dizziness.  Will get orthostatic BP.  Will obtain mri of brain as well.  Can also be from medication related causing blood pressure fluctuation with Cymbalta or Flomax.  Pt needs aggressive PT he is frail and weak appearing.

## 2023-09-09 NOTE — Progress Notes (Signed)
1      PROGRESS NOTE    Steven Elliott  GNF:[id number] DOB: 05-22-1930 DOA: 09/08/2023 PCP: [facility]    Brief Narrative:   89 y.o. male with medical history significant for dementia, chronic kidney disease, COPD, hyponatremia prostate cancer with metastasis to the bone admitted with fall resulting in T11-12 fx.   12/19: Neurosurgery, palliative care consult, PT OT eval  Assessment & Plan:   Principal Problem:   Fall Active Problems:   Orthostatic hypotension   Abnormal LFTs   COPD (chronic obstructive pulmonary disease) (HCC)   Chronic kidney disease, stage 3a (HCC)   Dementia (HCC)   Prostate cancer metastatic to bone (HCC)   Cerebral aneurysm, nonruptured   Essential (primary) hypertension   Oropharyngeal dysphagia   Anemia   Fracture dislocation of thoracic spine, closed, initial encounter (HCC)   Closed wedge compression fracture of T11 vertebra (HCC)   Compression fracture of T12 vertebra (HCC)  T11-12 fractures status post fall -underlying ankylosing spondylitis Appreciate neurosurgery input CT head showing no acute pathology TLSO when out of bed PT and OT eval Pain meds as needed and muscle relaxant -Outpatient neurosurgery follow-up  Abnormal LFTs Monitor    COPD (chronic obstructive pulmonary disease) (HCC) Stable, on room air   Dementia (HCC) At baseline   Anemia of chronic disease Stable H&H, monitor   Essential (primary) hypertension Start Norvasc   Cerebral aneurysm, nonruptured CT head shows stable aneurysm   Prostate cancer metastatic to bone (HCC) On N[place]a    Goals of care Palliative care consult  DVT prophylaxis: Heparin heparin injection 5,000 Units Start: 09/09/23 0000     Code Status: (Full code Family Communication: Updated daughter over phone Disposition Plan: Possible discharge in next 2 to 3 days depending on clinical condition, PT and OT eval   Consultants:  Neurosurgery   Subjective:  Patient  confused and demented at baseline unable to provide much history  Objective: Vitals:   09/09/23 1100 09/09/23 1200 09/09/23 1615 09/09/23 1732  BP: 139/89 132/70  (!) 152/84  Pulse: (!) 102 (!) 103  96  Resp: 18 (!) 25  20  Temp:   98.3 F (36.8 C) 98.6 F (37 C)  TempSrc:   Oral   SpO2: 98% 96%  94%  Weight:      Height:        Intake/Output Summary (Last 24 hours) at 09/09/2023 2009 Last data filed at 09/09/2023 1830 Gross per 24 hour  Intake --  Output 600 ml  Net -600 ml   Filed Weights   09/08/23 1337  Weight: 79 kg    Examination:  General exam: Appears calm and comfortable  Respiratory system: Clear to auscultation. Respiratory effort normal. Cardiovascular system: S1 & S2 heard, RRR.  Gastrointestinal system: Abdomen is soft, benign Central nervous system: Alert and awake, pleasantly confused no focal neurological deficits. Overall difficult examination due to his underlying dementia.  He is not cooperating with exam    Data Reviewed: I have personally reviewed following labs and imaging studies  CBC: Recent Labs  Lab 09/08/23 1342 09/09/23 1003  WBC 9.9 9.0  NEUTROABS 8.4*  --   HGB 12.8* 12.5*  HCT 38.4* 37.5*  MCV 95.8 96.2  PLT 217 219   Basic Metabolic Panel: Recent Labs  Lab 09/08/23 1717 09/09/23 1003  NA 134* 136  K 3.9 3.6  CL 102 103  CO2 23 23  GLUCOSE 127* 109*  BUN 15 13  CREATININE 1.00 0.95  CALCIUM 8.3* 8.0*   GFR: Estimated Creatinine Clearance: 49 mL/min (by C-G formula based on SCr of 0.95 mg/dL). Liver Function Tests: Recent Labs  Lab 09/09/23 1003  AST 26  ALT 17  ALKPHOS 107  BILITOT 1.7*  PROT 6.7  ALBUMIN 3.4*    Thyroid Function Tests: Recent Labs    09/09/23 1003  TSH 1.460  FREET4 1.23*   Anemia Panel: No results for input(s): "VITAMINB12", "FOLATE", "FERRITIN", "TIBC", "IRON", "RETICCTPCT" in the last 72 hours. Sepsis Labs: Recent Labs  Lab 09/09/23 0646 09/09/23 1003  LATICACIDVEN 0.9  0.9    No results found for this or any previous visit (from the past 240 hours).       Radiology Studies: CT ABDOMEN PELVIS WO CONTRAST Addendum Date: 09/09/2023 ADDENDUM REPORT: 09/09/2023 11:09 ADDENDUM: Critical Value/emergent results were called by telephone at the time of interpretation on 09/09/2023 at 11:09 am to provider Delfino Lovett, MD, who verbally acknowledged these results. Electronically Signed   By: Signa Kell M.D.   On: 09/09/2023 11:09   Result Date: 09/09/2023 CLINICAL DATA:  Abdominal trauma. Status post fall. Pneumatosis identified on CT. History of prostate cancer. EXAM: CT ABDOMEN AND PELVIS WITHOUT CONTRAST TECHNIQUE: Multidetector CT imaging of the abdomen and pelvis was performed following the standard protocol without IV contrast. RADIATION DOSE REDUCTION: This exam was performed according to the departmental dose-optimization program which includes automated exposure control, adjustment of the mA and/or kV according to patient size and/or use of iterative reconstruction technique. COMPARISON:  PET-CT 06/22/2023 FINDINGS: Lower chest: Emphysematous changes. Dependent atelectasis in volume loss noted within the lung bases. Hepatobiliary: Low-density structure within segment 3 measures 1 cm. This is unchanged from 04/23/2022 and is favored to represent a benign abnormality. Status post cholecystectomy. Common bile duct measures 1 cm and is unchanged from 05/10/2022. In the absence of clinical signs/symptoms of biliary obstruction this most likely reflects post cholecystectomy physiology Pancreas: Unremarkable. No pancreatic ductal dilatation or surrounding inflammatory changes. Spleen: Normal in size without focal abnormality. Adrenals/Urinary Tract: Normal adrenal glands. No signs of obstructive uropathy. No kidney mass identified. Cyst off the lateral cortex of left kidney measures 1 cm, image 34/8. No specific follow-up recommendations. Bladder wall trabeculation with  multiple intramural diverticula identified are favored to represent sequelae of chronic bladder outlet obstruction. Stomach/Bowel: The assessment of bowel pathology is diminished due to lack of IV and enteric contrast material. Stomach appears nondistended. Within the left upper quadrant of the abdomen there are several loops of small bowel which are increased in caliber measuring up to 3.7 cm. Normal caliber mid and distal small bowel loops. No abnormal colonic dilatation. Colonic diverticulosis is most severe at the level of the sigmoid colon. There is no sign of acute diverticulitis. 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 In retrospect this was present on the PET-CT dated 06/22/2023. No sign portal venous gas. There is no abnormal small bowel wall thickening or surrounding inflammatory fat stranding. Vascular/Lymphatic: Extensive aortic atherosclerosis. Status post stent graft repair abdominal aortic aneurysm. Aneurysm sac measures 4.5 x 3.6 cm, image 38/3. Unchanged from 06/22/2023. No enlarged abdominopelvic lymph nodes. Reproductive: Unchanged appearance of the prostate gland compared with 06/22/2023. Other: No convincing evidence for pneumoperitoneum. No significant free fluid or fluid collections. Large bilateral fat containing inguinal hernias. Musculoskeletal: Signs of ankylosing spondylitis with diffuse ankylosis of the thoracic and upper lumbar spine. Rigid spine fracture across the T11-T12 level as described on lumbar spine  CT from 09/08/23. 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 IMPRESSION: 1. 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 In retrospect this was present on the PET-CT dated 06/22/2023. There is no abnormal small bowel wall thickening or surrounding inflammatory fat stranding. No sign of portal venous gas. Absence of significant findings like bowel wall  thickening, inflammation, free fluid, and pneumoperitoneum suggests that the pneumatosis is not associated with an acute or severe pathological process like acute ischemia, perforation, or infection. Findings may be related to factors such as benign small bowel pneumatosis, which may be related to factors such as chronic mechanical stress (constipation), subclinical mesenteric ischemia, vascular changes from AAA repair, or radiation effects (if applicable). Advise close clinical monitoring and follow-up imaging (CT mesenteric angiogram) if there are clinical concerns for mesenteric ischemia 2. Within the left upper quadrant of the abdomen there are several loops of small bowel which are increased in caliber measuring up to 3.7 cm with normal caliber mid and distal small bowel loops. Findings are favored to represent ileus. 3. Status post stent graft repair of abdominal aortic aneurysm. Aneurysm sac measures 4.5 x 3.6 cm. Unchanged from 06/22/2023. 4. 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 Rigid spine fracture across the T11-T12 level as described on lumbar spine CT from 09/08/23. 5. 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 6. Aortic Atherosclerosis (ICD10-I70.0) and Emphysema (ICD10-J43.9). Electronically Signed: By: Signa Kell M.D. On: 09/09/2023 08:48   CT Angio Chest Pulmonary Embolism (PE) W or WO Contrast Result Date: 09/09/2023 CLINICAL DATA:  Syncope/presyncope with cerebrovascular cause suspected. EXAM: CT ANGIOGRAPHY CHEST WITH CONTRAST TECHNIQUE: Multidetector CT imaging of the chest was performed using the standard protocol during bolus administration of intravenous contrast. Multiplanar CT image reconstructions and MIPs were obtained to evaluate the vascular anatomy. RADIATION DOSE REDUCTION: This exam was performed according to the departmental dose-optimization program which includes automated exposure  control, adjustment of the mA and/or kV according to patient size and/or use of iterative reconstruction technique. CONTRAST:  75mL OMNIPAQUE IOHEXOL 350 MG/ML SOLN COMPARISON:  None Available. FINDINGS: Cardiovascular: Satisfactory opacification of the pulmonary arteries to the segmental level. No evidence of pulmonary embolism. Normal heart size. No pericardial effusion.Atheromatous calcification of the aorta and coronaries. Mediastinum/Nodes: No mass or adenopathy. Lungs/Pleura: Centrilobular and panlobular emphysema. Dependent atelectasis with subpleural opacification and volume loss. Upper Abdomen: Extensive pneumatosis around epigastric bowel loops Musculoskeletal: Ankylosing spondylitis with diffuse ankylosis. Rigid spine fracture across the T11-12 level following the disc space, reference dedicated imaging from yesterday. No interval displacement Review of the MIP images confirms the above findings. Critical Value/emergent results were called by telephone at the time of interpretation on 09/09/2023 at 5:25 am to provider Jon Billings, who verbally acknowledged these results. IMPRESSION: 1. Extensive pneumatosis affecting epigastric bowel loops, partial coverage with cause not clearly identified, recommend abdominal exam/dedicated imaging. 2. Negative for pulmonary embolism.  Dependent atelectasis. 3. Known rigid spine fracture at T11-12 without interval displacement. Electronically Signed   By: Tiburcio Pea M.D.   On: 09/09/2023 05:26   CT HEAD WO CONTRAST ( ) Result Date: 09/09/2023 CLINICAL DATA:  Syncope/presyncope with cerebrovascular cause suspected EXAM: CT HEAD WITHOUT CONTRAST TECHNIQUE: Contiguous axial images were obtained from the base of the skull through the vertex without intravenous contrast. RADIATION DOSE REDUCTION: This exam was performed according to the departmental dose-optimization program which includes automated exposure control,  adjustment of the mA and/or kV according to  patient size and/or use of iterative reconstruction technique. COMPARISON:  04/18/2023 FINDINGS: Brain: No evidence of acute infarction, hemorrhage, hydrocephalus, extra-axial collection or mass lesion/mass effect. Generalized atrophy. Vascular: Right MCA bifurcation aneurysm with heavy mural calcification, ~ 1 cm on sagittal reformats. Skull: Normal. Negative for fracture or focal lesion. Sinuses/Orbits: No acute finding. IMPRESSION: 1. No acute finding. 2. Atrophy, atherosclerosis, and long-standing right MCA aneurysm (seen since at least 2005). Electronically Signed   By: Tiburcio Pea M.D.   On: 09/09/2023 04:35   CT Lumbar Spine Wo Contrast Result Date: 09/08/2023 CLINICAL DATA:  Back trauma, no prior imaging (Age >= 16y). Low back pain since a fall 10 days ago. History of prostate cancer. EXAM: CT LUMBAR SPINE WITHOUT CONTRAST TECHNIQUE: Multidetector CT imaging of the lumbar spine was performed without intravenous contrast administration. Multiplanar CT image reconstructions were also generated. RADIATION DOSE REDUCTION: This exam was performed according to the departmental dose-optimization program which includes automated exposure control, adjustment of the mA and/or kV according to patient size and/or use of iterative reconstruction technique. COMPARISON:  Lumbar spine radiographs 08/29/2023. CT abdomen and pelvis 05/20/2022. PET-CT 06/22/2023. FINDINGS: Segmentation: 5 lumbar type vertebrae. Alignment: Mild-to-moderate thoracolumbar dextroscoliosis. Trace anterolisthesis of L4 on L5. Vertebrae: Diffuse osteopenia. Acute fractures involving the T11 inferior and T12 superior endplates and vertebral syndesmophytes with at most minimal displacement and no definitive posterior element fracture. Interbody ankylosis from T9-T11 and T12-L2. Partial ankylosis of the SI joints. Sclerotic lesions in the posteromedial right tenth rib, right T11 transverse process, and right L2 transverse process, similar to the  prior PET-CT. Paraspinal and other soft tissues: Cholecystectomy. Aortoiliac stent graft. Emphysema. Aortic atherosclerosis. Dependent atelectasis in the lungs, likely with small left and possible trace right pleural effusions. Disc levels: Mild disc degeneration and advanced facet arthrosis in the lower lumbar spine. No evidence of high-grade stenosis. IMPRESSION: 1. Acute, at most minimally displaced fractures through the T11-12 disc space and adjacent vertebral bodies. 2. 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 3. Known small sclerotic bone metastases. 4.  Aortic Atherosclerosis (ICD10-I70.0). Electronically Signed   By: Sebastian Ache M.D.   On: 09/08/2023 16:28        Scheduled Meds:  darolutamide  600 mg Oral BID WC   DULoxetine  20 mg Oral BID   famotidine  20 mg Oral QHS   gabapentin  100 mg Oral QHS   galantamine  16 mg Oral Q breakfast   heparin  5,000 Units Subcutaneous Q12H   lidocaine  1 patch Transdermal Q24H   memantine  10 mg Oral BID   sodium chloride flush  3 mL Intravenous Q12H   tamsulosin  0.4 mg Oral QHS   Continuous Infusions:   LOS: 0 days    Time spent: 35 minutes    Delfino Lovett, MD [facility] Pager [phone]  If 7PM-7AM, please contact night-coverage [email or web address] Password [email or web address] 09/09/2023, 8:09 PM

## 2023-09-09 NOTE — ED Notes (Signed)
Family member at bedside.

## 2023-09-09 NOTE — Assessment & Plan Note (Signed)
Fall today with no reports of head trauma will do head ct rule out same.

## 2023-09-09 NOTE — ED Notes (Signed)
Pt O2 tank changed at this time.

## 2023-09-09 NOTE — Assessment & Plan Note (Addendum)
Patient has a history of severe COPD. Will obtain a CT of his chest as well. SpO2: 96 % O2 Flow Rate (L/min): 2 L/min

## 2023-09-09 NOTE — Assessment & Plan Note (Signed)
Vitals:   09/08/23 1443 09/08/23 1857 09/08/23 2345 09/09/23 0044  BP: 135/80 (!) 143/95 (!) 164/90 (!) 167/104   09/09/23 0200  BP: (!) 139/98  We will manage with PRN hydralazine.

## 2023-09-09 NOTE — Plan of Care (Signed)
Consult noted for goals of care.  Patient is currently in a hall bed.  He does not awaken to voice or touch.  No family at bedside.   Returned to bedside later in the afternoon and no family at bedside.  Spoke with sister [name] via phone, plans in place for a meeting tomorrow morning.

## 2023-09-10 ENCOUNTER — Inpatient Hospital Stay: Payer: No Typology Code available for payment source

## 2023-09-10 DIAGNOSIS — W19XXXA Unspecified fall, initial encounter: Secondary | ICD-10-CM

## 2023-09-10 DIAGNOSIS — I671 Cerebral aneurysm, nonruptured: Secondary | ICD-10-CM

## 2023-09-10 DIAGNOSIS — C7951 Secondary malignant neoplasm of bone: Secondary | ICD-10-CM

## 2023-09-10 DIAGNOSIS — F0390 Unspecified dementia without behavioral disturbance: Secondary | ICD-10-CM

## 2023-09-10 DIAGNOSIS — S22009A Unspecified fracture of unspecified thoracic vertebra, initial encounter for closed fracture: Secondary | ICD-10-CM

## 2023-09-10 DIAGNOSIS — I10 Essential (primary) hypertension: Secondary | ICD-10-CM

## 2023-09-10 DIAGNOSIS — C61 Malignant neoplasm of prostate: Secondary | ICD-10-CM

## 2023-09-10 LAB — CBC
HCT: 34.2 % — ABNORMAL LOW (ref 39.0–52.0)
Hemoglobin: 11.5 g/dL — ABNORMAL LOW (ref 13.0–17.0)
MCH: 32 pg (ref 26.0–34.0)
MCHC: 33.6 g/dL (ref 30.0–36.0)
MCV: 95.3 fL (ref 80.0–100.0)
Platelets: 237 10*3/uL (ref 150–400)
RBC: 3.59 MIL/uL — ABNORMAL LOW (ref 4.22–5.81)
RDW: 13.7 % (ref 11.5–15.5)
WBC: 7.2 10*3/uL (ref 4.0–10.5)
nRBC: 0 % (ref 0.0–0.2)

## 2023-09-10 LAB — BASIC METABOLIC PANEL
Anion gap: 12 (ref 5–15)
BUN: 18 mg/dL (ref 8–23)
CO2: 22 mmol/L (ref 22–32)
Calcium: 7.9 mg/dL — ABNORMAL LOW (ref 8.9–10.3)
Chloride: 106 mmol/L (ref 98–111)
Creatinine, Ser: 0.92 mg/dL (ref 0.61–1.24)
GFR, Estimated: >60 mL/min (ref >60)
Glucose, Bld: 77 mg/dL (ref 70–99)
Potassium: 3.8 mmol/L (ref 3.5–5.1)
Sodium: 140 mmol/L (ref 135–145)

## 2023-09-10 LAB — HEPATIC FUNCTION PANEL
ALT: 15 U/L (ref 0–44)
AST: 24 U/L (ref 15–41)
Albumin: 2.9 g/dL — ABNORMAL LOW (ref 3.5–5.0)
Alkaline Phosphatase: 90 U/L (ref 38–126)
Bilirubin, Direct: 0.5 mg/dL — ABNORMAL HIGH (ref 0.0–0.2)
Indirect Bilirubin: 1.6 mg/dL — ABNORMAL HIGH (ref 0.3–0.9)
Total Bilirubin: 2.1 mg/dL — ABNORMAL HIGH (ref <1.2)
Total Protein: 5.9 g/dL — ABNORMAL LOW (ref 6.5–8.1)

## 2023-09-10 MED ORDER — METHOCARBAMOL 500 MG PO TABS
500.0000 mg | ORAL_TABLET | Freq: Once | ORAL | Status: AC
  Administered 2023-09-10: 500 mg via ORAL
  Filled 2023-09-10: qty 1

## 2023-09-10 MED ORDER — GUAIFENESIN 100 MG/5ML PO LIQD
5.0000 mL | ORAL | Status: DC | PRN
  Administered 2023-09-10 – 2023-09-13 (×2): 5 mL via ORAL
  Filled 2023-09-10 (×2): qty 10

## 2023-09-10 MED ORDER — HALOPERIDOL LACTATE 5 MG/ML IJ SOLN
5.0000 mg | Freq: Once | INTRAMUSCULAR | Status: AC
  Administered 2023-09-10: 5 mg via INTRAVENOUS
  Filled 2023-09-10: qty 1

## 2023-09-10 MED ORDER — RISPERIDONE 0.5 MG PO TABS
0.5000 mg | ORAL_TABLET | Freq: Every day | ORAL | Status: DC
  Administered 2023-09-10 – 2023-09-14 (×5): 0.5 mg via ORAL
  Filled 2023-09-10 (×7): qty 1

## 2023-09-10 NOTE — Evaluation (Addendum)
Physical Therapy Evaluation Patient Details Name: Steven Elliott MRN: [id number] DOB: 1930/02/21 Today's Date: 09/10/2023  History of Present Illness  89 y.o. male with medical history significant for dementia, chronic kidney disease, COPD, hyponatremia prostate cancer with metastasis to the bone admitted with fall resulting in T11-12 fx. TLSO when OOB.  Clinical Impression  Author returns after analgesia in place. Pt pleasant cooperative inititally, but things escalate over time, pt becomes anxious, inconsolable, demanding to leave immediately, refusing medications- suspect multifactorial with pain worse in chair, elevated HR, desaturation, need to poop, poor self awareness from dementia.  -ModA to logroll to EOB -author assists with TLSO donning (tried to educate DTR but did not completed),  -Minguard to standing from elevated surface, appropriate use of RW -steady step pivot to recliner c RW, but with weak descend to chair (minA) and anxious dizziness thereafter ~1 minutes (HR 115, SpO2 87% on room air)  -10-12 minutes in chair while Chartered loss adjuster collected info from DTR: (pt showing progressive irritability, recurrent O2 doffing for nasal maintenance, repeat declined PO food or drink presented, voiced need to pass bowels  -pt eventually agreeable to attempt AMB to BR 29ft with RW, minguard assist (should have O2 on in future AMB bouts)  -min-modA pericare post BM -81ft AMB back to bed.  Pt still very limited by new pain issues in back, needs a bit more physical assist to achieve daily occupations- unclear if family can provide this degree of physical assist will defer to their judgement. STR would allow for care needs to be safely met while his back heals and he begins to move a little better. Have reached out to MD regarding recs.       If plan is discharge home, recommend the following: A lot of help with walking and/or transfers;A lot of help with bathing/dressing/bathroom;Assistance with  cooking/housework;Assist for transportation;Help with stairs or ramp for entrance   Can travel by private vehicle   No    Equipment Recommendations BSC/3in1;Wheelchair (measurements PT);Wheelchair cushion (measurements PT)  Recommendations for Other Services       Functional Status Assessment Patient has had a recent decline in their functional status and demonstrates the ability to make significant improvements in function in a reasonable and predictable amount of time.     Precautions / Restrictions Precautions Precautions: Fall Required Braces or Orthoses: Spinal Brace Spinal Brace: Thoracolumbosacral orthotic;Applied in sitting position Restrictions Weight Bearing Restrictions Per Provider Order: No      Mobility  Bed Mobility Overal bed mobility: Needs Assistance Bed Mobility: Rolling, Sidelying to Sit, Sit to Sidelying Rolling: Mod assist Sidelying to sit: Mod assist, +2 for physical assistance     Sit to sidelying: +2 for physical assistance, Total assist      Transfers Overall transfer level: Needs assistance (1 from recliner, 1 from elevated EOB, 1 from toilet riser) Equipment used: Rolling walker (2 wheels) Transfers: Sit to/from Stand Sit to Stand: Contact guard assist, From elevated surface                Ambulation/Gait Ambulation/Gait assistance:  (22ft to BR, 54ft back to bed) Gait Distance (Feet): 20 Feet Assistive device: Rolling walker (2 wheels) Gait Pattern/deviations: WFL(Within Functional Limits), Step-to pattern          Stairs            Wheelchair Mobility     Tilt Bed    Modified Rankin (Stroke Patients Only)       Balance  Pertinent Vitals/Pain Pain Assessment Pain Assessment: PAINAD Breathing: occasional labored breathing, short period of hyperventilation Negative Vocalization: occasional moan/groan, low speech, negative/disapproving  quality Facial Expression: facial grimacing Body Language: tense, distressed pacing, fidgeting Consolability: unable to console, distract or reassure PAINAD Score: 7 Pain Location: back    Home Living Family/patient expects to be discharged to:: Private residence Living Arrangements: Children Available Help at Discharge: Family;Available 24 hours/day Type of Home: House Home Access: Ramped entrance     Alternate Level Stairs-Number of Steps: chair lift to 2nd floor, has to go up to bathe; has hospital bed on main level Home Layout: Two level;Bed/bath upstairs Home Equipment: Tub bench;Hospital bed;Rollator (4 wheels);Rolling Walker (2 wheels)      Prior Function Prior Level of Function : History of Falls (last six months);Needs assist       Physical Assist : Mobility (physical);ADLs (physical) Mobility (physical): Transfers;Gait ADLs (physical): Bathing;Dressing;Toileting Mobility Comments: uses RW at all times and has SUP to CGA from daughters at all times; 3 falls in the last 3 months; does not extend knees in standing at baseline ADLs Comments: has assist for all ADLs and IADLs from daughters; one reports she provides more assist than the other     Extremity/Trunk Assessment                Communication   Communication Communication: Difficulty following commands/understanding  Cognition                                                General Comments      Exercises     Assessment/Plan    PT Assessment Patient needs continued PT services  PT Problem List Decreased knowledge of use of DME;Decreased safety awareness;Decreased knowledge of precautions;Decreased activity tolerance;Decreased mobility;Decreased cognition       PT Treatment Interventions DME instruction;Patient/family education;Stair training;Gait training;Functional mobility training;Therapeutic activities;Therapeutic exercise;Balance training;Neuromuscular  re-education;Cognitive remediation    PT Goals (Current goals can be found in the Care Plan section)  Acute Rehab PT Goals Patient Stated Goal: be able to physically manage pt at home. PT Goal Formulation: With family Time For Goal Achievement: 09/24/23 Potential to Achieve Goals: Good    Frequency 7X/week     Co-evaluation               AM-PAC PT "6 Clicks" Mobility  Outcome Measure Help needed turning from your back to your side while in a flat bed without using bedrails?: Total Help needed moving from lying on your back to sitting on the side of a flat bed without using bedrails?: Total Help needed moving to and from a bed to a chair (including a wheelchair)?: A Lot Help needed standing up from a chair using your arms (e.g., wheelchair or bedside chair)?: A Lot Help needed to walk in hospital room?: A Little Help needed climbing 3-5 steps with a railing? : A Lot 6 Click Score: 11    End of Session Equipment Utilized During Treatment: Oxygen Activity Tolerance: Patient limited by fatigue;Treatment limited secondary to agitation Patient left: in bed;with call bell/phone within reach (going out of room for room change) Nurse Communication: Mobility status PT Visit Diagnosis: Difficulty in walking, not elsewhere classified (R26.2);Other abnormalities of gait and mobility (R26.89)    Time: 1224-1330 PT Time Calculation (min) (ACUTE ONLY): 66 min   Charges:   PT Evaluation $  PT Eval Moderate Complexity: 1 Mod PT Treatments $Therapeutic Activity: 23-37 mins PT General Charges $$ ACUTE PT VISIT: 1 Visit       3:22 PM, 09/10/23 Rosamaria Lints, PT, DPT Physical Therapist - [facility]  [phone] (ASCOM)    Correne Lalani C 09/10/2023, 3:22 PM

## 2023-09-10 NOTE — Progress Notes (Signed)
PT Cancellation Note  Patient Details Name: Steven Elliott MRN: [id number] DOB: Apr 11, 1930   Cancelled Treatment:    Reason Eval/Treat Not Completed: Pain limiting ability to participate (Pt awake, RN in room. Pt reports he will need pain medication prior to tolrating any PT assessment. I'll come back.)   Bianca Vester C 09/10/2023, 10:51 AM

## 2023-09-10 NOTE — Plan of Care (Addendum)
In to meet with family for GOC at established time. Patient is in a semi-private room. He is alert today and restless. Daughter who is at bedside shares she was not happy with his being placed in a hall bed yesterday in the ED, is not comfortable with a semi-private room. With conversation, she states she is not comfortable with discussing GOC at this time.   PMT will be happy to have a full GOC discussion when patient is in a private room. Please reconsult when patient is in a private room.

## 2023-09-10 NOTE — Evaluation (Signed)
Occupational Therapy Evaluation Patient Details Name: Steven Elliott MRN: [id number] DOB: [birth year] Today's Date: 09/10/2023   History of Present Illness 89 y.o. male with medical history significant for dementia, chronic kidney disease, COPD, hyponatremia prostate cancer with metastasis to the bone admitted with fall resulting in T11-12 fx. TLSO when OOB.   Clinical Impression   Pt was seen for OT evaluation this date. Prior to hospital admission, pt lives at home with his daughter and has another daughter who assists. He needed assist with all ADLs, SUP to CGA with all mobility and transfers using a RW. 3 falls reported in the last 3 months. He is only oriented to person and his daughters/family. They use a clock with date and time on it at home for dementia. Stays on main level with hospital bed and goes upstairs via chair lift to bathe with assist on tub bench.   Pt presents to acute OT demonstrating impaired ADL performance and functional mobility 2/2 pain, weakness, balance deficits (See OT problem list for additional functional deficits). Pt currently requires Min A to log roll, Max A for sidelying to sit and sit to sidelying. Reports 6/10 pain with nurse to give meds soon. Sat EOB for OT to don TLSO and adjust it. Pt still reporting it was not comfortable. Sp02 dropped to 83% on RA, replaced 2L [place] with improvement to [facility]. HR 109-117 with minimal activity-per daughter his HR is always high. STS from EOB with Mod A to RW and MOD A for lateral steps to [facility]. Daughter provided CGA as well throughout evaluation as she assists him at home. He has needed assist with bed mobility since his last fall from the back pain.  Pt would benefit from skilled OT services to address noted impairments and functional limitations (see below for any additional details) in order to maximize safety and independence while minimizing falls risk and caregiver burden. Do anticipate the need for follow up OT services  upon acute hospital DC. Recommend SNF versus HH d/t dementia pending if family is available/able to continue providing these levels of assist at home. If not, recommend SNF.       If plan is discharge home, recommend the following: A lot of help with walking and/or transfers;A lot of help with bathing/dressing/bathroom;Direct supervision/assist for medications management;Supervision due to cognitive status;Direct supervision/assist for financial management;Assistance with cooking/housework;Help with stairs or ramp for entrance    Functional Status Assessment  Patient has had a recent decline in their functional status and demonstrates the ability to make significant improvements in function in a reasonable and predictable amount of time.  Equipment Recommendations  Other (comment) (has needed equipment)    Recommendations for Other Services       Precautions / Restrictions Precautions Precautions: Fall Required Braces or Orthoses: Spinal Brace Spinal Brace: Thoracolumbosacral orthotic;Applied in sitting position Restrictions Weight Bearing Restrictions Per Provider Order: No      Mobility Bed Mobility Overal bed mobility: Needs Assistance Bed Mobility: Sidelying to Sit, Sit to Sidelying, Rolling Rolling: Min assist, Used rails Sidelying to sit: Max assist, HOB elevated, Used rails     Sit to sidelying: Max assist, Used rails, HOB elevated General bed mobility comments: for trunk and BLE management-daughter reports he has needed assist at home as well since the fall    Transfers Overall transfer level: Needs assistance Equipment used: Rolling walker (2 wheels) Transfers: Sit to/from Stand Sit to Stand: Mod assist           General  transfer comment: Mod A for STS from EOB and verb cues to push up from the bed with one hand; able to take lateral steps with increased cueing and time--Min/Mod Ax2 (dtr in room and also standing on other side of pt during standing)       Balance Overall balance assessment: Needs assistance Sitting-balance support: Bilateral upper extremity supported, Feet supported Sitting balance-Leahy Scale: Poor Sitting balance - Comments: needs Min A to maintain seated balance d/t pain   Standing balance support: Bilateral upper extremity supported, During functional activity, Reliant on assistive device for balance Standing balance-Leahy Scale: Poor Standing balance comment: needs Mod A to maintain standing balance and take lateral steps                           ADL either performed or assessed with clinical judgement   ADL Overall ADL's : Needs assistance/impaired                     Lower Body Dressing: Total assistance;Bed level Lower Body Dressing Details (indicate cue type and reason): to don socks               General ADL Comments: per daughter report at baseline he needs max A with LB ADLs and SBA to Min with UB ADLs     Vision         Perception         Praxis         Pertinent Vitals/Pain Pain Assessment Pain Assessment: 0-10 Pain Score: 6  Pain Location: back Pain Descriptors / Indicators: Aching, Sore, Shooting Pain Intervention(s): Limited activity within patient's tolerance, Monitored during session, Patient requesting pain meds-RN notified, Repositioned     Extremity/Trunk Assessment             Communication Communication Communication: Difficulty following commands/understanding   Cognition Arousal: Alert Behavior During Therapy: Restless Overall Cognitive Status: History of cognitive impairments - at baseline                                 General Comments: oriented to person only at baseline-daughter reports they keep a close that has the time, date and month on it at home     General Comments       Exercises Other Exercises Other Exercises: Edu in role of OT in acute setting and importance of therapy to maximize strength and IND.    Shoulder Instructions      Home Living Family/patient expects to be discharged to:: Private residence Living Arrangements: Children Available Help at Discharge: Family;Available 24 hours/day Type of Home: House Home Access: Ramped entrance     Home Layout: Two level;Bed/bath upstairs Alternate Level Stairs-Number of Steps: chair lift to 2nd floor, has to go up to bathe; has hospital bed on main level   Bathroom Shower/Tub: Tub/shower unit         Home Equipment: Agricultural consultant (2 wheels);Tub bench;Hospital bed          Prior Functioning/Environment Prior Level of Function : History of Falls (last six months);Needs assist       Physical Assist : Mobility (physical);ADLs (physical) Mobility (physical): Transfers;Gait ADLs (physical): Bathing;Dressing;Toileting Mobility Comments: uses RW at all times and has SUP to CGA from daughters at all times; 3 falls in the last 3 months; does not extend knees in standing at baseline ADLs Comments:  has assist for all ADLs and IADLs from daughters; one reports she provides more assist than the other        OT Problem List: Decreased strength;Pain;Decreased cognition;Decreased activity tolerance;Decreased safety awareness;Impaired balance (sitting and/or standing);Decreased knowledge of use of DME or AE;Decreased knowledge of precautions      OT Treatment/Interventions: Self-care/ADL training;Therapeutic exercise;Therapeutic activities;DME and/or AE instruction;Patient/family education;Balance training    OT Goals(Current goals can be found in the care plan section) Acute Rehab OT Goals OT Goal Formulation: With patient Time For Goal Achievement: 09/24/23 Potential to Achieve Goals: Fair  OT Frequency: Min 1X/week    Co-evaluation              AM-PAC OT "6 Clicks" Daily Activity     Outcome Measure Help from another person eating meals?: None Help from another person taking care of personal grooming?: A Little Help from  another person toileting, which includes using toliet, bedpan, or urinal?: A Lot Help from another person bathing (including washing, rinsing, drying)?: A Lot Help from another person to put on and taking off regular upper body clothing?: A Lot Help from another person to put on and taking off regular lower body clothing?: A Lot 6 Click Score: 15   End of Session Equipment Utilized During Treatment: Rolling walker (2 wheels) Nurse Communication: Mobility status  Activity Tolerance: Patient limited by pain Patient left: in bed;with call bell/phone within reach;with bed alarm set;with nursing/sitter in room;with family/visitor present  OT Visit Diagnosis: Other abnormalities of gait and mobility (R26.89);Unsteadiness on feet (R26.81)                Time: 2536-6440 OT Time Calculation (min): 33 min Charges:  OT General Charges $OT Visit: 1 Visit OT Evaluation $OT Eval Moderate Complexity: 1 Mod OT Treatments $Therapeutic Activity: 8-22 mins  Farmer Mccahill, OTR/L  09/10/23, 1:45 PM   Eldar Robitaille E Marsean Elkhatib 09/10/2023, 1:45 PM

## 2023-09-10 NOTE — Progress Notes (Signed)
Progress Note   Patient: Steven Elliott JXB:[id number] DOB: Jun 14, 1930 DOA: 09/08/2023     1 DOS: the patient was seen and examined on 09/10/2023   Brief hospital course: 89 y.o. male with medical history significant for dementia, chronic kidney disease, COPD, hyponatremia prostate cancer with metastasis to the bone admitted with fall resulting in T11-12 fx.   Neurosurgery team advised TLSO brace, pain control, PT/ OT.  Assessment and Plan: T11-12 fractures status post fall -underlying ankylosing spondylitis Neurosurgery team advised TLSO brace, PT/ OT, pain control. CT head showing no acute pathology TLSO when out of bed PT and OT advised rehab. Limit opiates given confusion. Tylenol PRN ordered. Outpatient neurosurgery follow-up   Abnormal LFTs Repeat LFT added to AM labs.   COPD (chronic obstructive pulmonary disease) (HCC) Stable, on room air. Duonebs as needed.   Dementia (HCC) At baseline. Gets confused at times. Avoid opiates, sedatives as he is high risk for confusion, falls Delirium precautions.   Anemia of chronic disease Stable H&H, monitor   Essential (primary) hypertension BP stable. Continue Norvasc(new med)   Cerebral aneurysm, nonruptured CT head shows stable aneurysm   Prostate cancer metastatic to bone (HCC) On Nubeqa, resumed home medication once checked with pharmacy.        Out of bed to chair. Incentive spirometry. Nursing supportive care. Fall, aspiration precautions. DVT prophylaxis   Code Status: Full Code Goals of care Palliative care on board.  Subjective: Patient is seen and examined today morning. He is pleasantly confused. Daughter at bedside. States his pain is better, has cough, eating poor. Advised to work with PT.  Physical Exam: Vitals:   09/10/23 0013 09/10/23 0448 09/10/23 0825 09/10/23 1219  BP: (!) 157/69 135/74 (!) 158/77 (!) 148/102  Pulse: (!) 102 99 96 97  Resp: 18 17 18 18   Temp:  98.2 F (36.8 C)  98.2 F (36.8 C) 98.1 F (36.7 C)  TempSrc:   Oral Oral  SpO2: 93% 95% 90% (!) 88%  Weight:      Height:        General - Elderly Caucasian male, no apparent distress HEENT - PERRLA, EOMI, atraumatic head, non tender sinuses. Lung - Clear, basal rhonchi, no wheezes. Heart - S1, S2 heard, no murmurs, rubs, trace pedal edema. Abdomen - Soft, non tender, non distended, bowel sounds good Neuro - Alert, awake and confused, non focal exam. Skin - Warm and dry.  Data Reviewed:      Latest Ref Rng & Units 09/10/2023    5:43 AM 09/09/2023   10:03 AM 09/08/2023    1:42 PM  CBC  WBC 4.0 - 10.5 K/uL 7.2  9.0  9.9   Hemoglobin 13.0 - 17.0 g/dL 13.0  86.5  78.4   Hematocrit 39.0 - 52.0 % 34.2  37.5  38.4   Platelets 150 - 400 K/uL 237  219  217       Latest Ref Rng & Units 09/10/2023    5:43 AM 09/09/2023   10:03 AM 09/08/2023    5:17 PM  BMP  Glucose 70 - 99 mg/dL 77  696  295   BUN 8 - 23 mg/dL 18  13  15    Creatinine 0.61 - 1.24 mg/dL 2.84  1.32  4.40   Sodium 135 - 145 mmol/L 140  136  134   Potassium 3.5 - 5.1 mmol/L 3.8  3.6  3.9   Chloride 98 - 111 mmol/L 106  103  102   CO2 22 -  32 mmol/L 22  23  23    Calcium 8.9 - 10.3 mg/dL 7.9  8.0  8.3    CT ABDOMEN PELVIS WO CONTRAST Addendum Date: 09/09/2023 ADDENDUM REPORT: 09/09/2023 11:09 ADDENDUM: Critical Value/emergent results were called by telephone at the time of interpretation on 09/09/2023 at 11:09 am to provider Delfino Lovett, MD, who verbally acknowledged these results. Electronically Signed   By: Signa Kell M.D.   On: 09/09/2023 11:09   Result Date: 09/09/2023 CLINICAL DATA:  Abdominal trauma. Status post fall. Pneumatosis identified on CT. History of prostate cancer. EXAM: CT ABDOMEN AND PELVIS WITHOUT CONTRAST TECHNIQUE: Multidetector CT imaging of the abdomen and pelvis was performed following the standard protocol without IV contrast. RADIATION DOSE REDUCTION: This exam was performed according to the departmental  dose-optimization program which includes automated exposure control, adjustment of the mA and/or kV according to patient size and/or use of iterative reconstruction technique. COMPARISON:  PET-CT 06/22/2023 FINDINGS: Lower chest: Emphysematous changes. Dependent atelectasis in volume loss noted within the lung bases. Hepatobiliary: Low-density structure within segment 3 measures 1 cm. This is unchanged from 04/23/2022 and is favored to represent a benign abnormality. Status post cholecystectomy. Common bile duct measures 1 cm and is unchanged from 05/10/2022. In the absence of clinical signs/symptoms of biliary obstruction this most likely reflects post cholecystectomy physiology Pancreas: Unremarkable. No pancreatic ductal dilatation or surrounding inflammatory changes. Spleen: Normal in size without focal abnormality. Adrenals/Urinary Tract: Normal adrenal glands. No signs of obstructive uropathy. No kidney mass identified. Cyst off the lateral cortex of left kidney measures 1 cm, image 34/8. No specific follow-up recommendations. Bladder wall trabeculation with multiple intramural diverticula identified are favored to represent sequelae of chronic bladder outlet obstruction. Stomach/Bowel: The assessment of bowel pathology is diminished due to lack of IV and enteric contrast material. Stomach appears nondistended. Within the left upper quadrant of the abdomen there are several loops of small bowel which are increased in caliber measuring up to 3.7 cm. Normal caliber mid and distal small bowel loops. No abnormal colonic dilatation. Colonic diverticulosis is most severe at the level of the sigmoid colon. There is no sign of acute diverticulitis. 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 In retrospect this was present on the PET-CT dated 06/22/2023. No sign portal venous gas. There is no abnormal small bowel wall thickening or surrounding inflammatory fat stranding.  Vascular/Lymphatic: Extensive aortic atherosclerosis. Status post stent graft repair abdominal aortic aneurysm. Aneurysm sac measures 4.5 x 3.6 cm, image 38/3. Unchanged from 06/22/2023. No enlarged abdominopelvic lymph nodes. Reproductive: Unchanged appearance of the prostate gland compared with 06/22/2023. Other: No convincing evidence for pneumoperitoneum. No significant free fluid or fluid collections. Large bilateral fat containing inguinal hernias. Musculoskeletal: Signs of ankylosing spondylitis with diffuse ankylosis of the thoracic and upper lumbar spine. Rigid spine fracture across the T11-T12 level as described on lumbar spine CT from 09/08/23. 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 IMPRESSION: 1. 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 In retrospect this was present on the PET-CT dated 06/22/2023. There is no abnormal small bowel wall thickening or surrounding inflammatory fat stranding. No sign of portal venous gas. Absence of significant findings like bowel wall thickening, inflammation, free fluid, and pneumoperitoneum suggests that the pneumatosis is not associated with an acute or severe pathological process like acute ischemia, perforation, or infection. Findings may be related to factors such as benign small  bowel pneumatosis, which may be related to factors such as chronic mechanical stress (constipation), subclinical mesenteric ischemia, vascular changes from AAA repair, or radiation effects (if applicable). Advise close clinical monitoring and follow-up imaging (CT mesenteric angiogram) if there are clinical concerns for mesenteric ischemia 2. Within the left upper quadrant of the abdomen there are several loops of small bowel which are increased in caliber measuring up to 3.7 cm with normal caliber mid and distal small bowel loops. Findings are favored to represent ileus. 3. Status post stent graft repair of  abdominal aortic aneurysm. Aneurysm sac measures 4.5 x 3.6 cm. Unchanged from 06/22/2023. 4. 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 Rigid spine fracture across the T11-T12 level as described on lumbar spine CT from 09/08/23. 5. 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 6. Aortic Atherosclerosis (ICD10-I70.0) and Emphysema (ICD10-J43.9). Electronically Signed: By: Signa Kell M.D. On: 09/09/2023 08:48   CT Angio Chest Pulmonary Embolism (PE) W or WO Contrast Result Date: 09/09/2023 CLINICAL DATA:  Syncope/presyncope with cerebrovascular cause suspected. EXAM: CT ANGIOGRAPHY CHEST WITH CONTRAST TECHNIQUE: Multidetector CT imaging of the chest was performed using the standard protocol during bolus administration of intravenous contrast. Multiplanar CT image reconstructions and MIPs were obtained to evaluate the vascular anatomy. RADIATION DOSE REDUCTION: This exam was performed according to the departmental dose-optimization program which includes automated exposure control, adjustment of the mA and/or kV according to patient size and/or use of iterative reconstruction technique. CONTRAST:  75mL OMNIPAQUE IOHEXOL 350 MG/ML SOLN COMPARISON:  None Available. FINDINGS: Cardiovascular: Satisfactory opacification of the pulmonary arteries to the segmental level. No evidence of pulmonary embolism. Normal heart size. No pericardial effusion.Atheromatous calcification of the aorta and coronaries. Mediastinum/Nodes: No mass or adenopathy. Lungs/Pleura: Centrilobular and panlobular emphysema. Dependent atelectasis with subpleural opacification and volume loss. Upper Abdomen: Extensive pneumatosis around epigastric bowel loops Musculoskeletal: Ankylosing spondylitis with diffuse ankylosis. Rigid spine fracture across the T11-12 level following the disc space, reference dedicated imaging from yesterday. No interval displacement Review of  the MIP images confirms the above findings. Critical Value/emergent results were called by telephone at the time of interpretation on 09/09/2023 at 5:25 am to provider Jon Billings, who verbally acknowledged these results. IMPRESSION: 1. Extensive pneumatosis affecting epigastric bowel loops, partial coverage with cause not clearly identified, recommend abdominal exam/dedicated imaging. 2. Negative for pulmonary embolism.  Dependent atelectasis. 3. Known rigid spine fracture at T11-12 without interval displacement. Electronically Signed   By: Tiburcio Pea M.D.   On: 09/09/2023 05:26   CT HEAD WO CONTRAST ( ) Result Date: 09/09/2023 CLINICAL DATA:  Syncope/presyncope with cerebrovascular cause suspected EXAM: CT HEAD WITHOUT CONTRAST TECHNIQUE: Contiguous axial images were obtained from the base of the skull through the vertex without intravenous contrast. RADIATION DOSE REDUCTION: This exam was performed according to the departmental dose-optimization program which includes automated exposure control, adjustment of the mA and/or kV according to patient size and/or use of iterative reconstruction technique. COMPARISON:  04/18/2023 FINDINGS: Brain: No evidence of acute infarction, hemorrhage, hydrocephalus, extra-axial collection or mass lesion/mass effect. Generalized atrophy. Vascular: Right MCA bifurcation aneurysm with heavy mural calcification, ~ 1 cm on sagittal reformats. Skull: Normal. Negative for fracture or focal lesion. Sinuses/Orbits: No acute finding. IMPRESSION: 1. No acute finding. 2. Atrophy, atherosclerosis, and long-standing right MCA aneurysm (seen since at least 2005). Electronically Signed   By: Tiburcio Pea M.D.   On: 09/09/2023 04:35   CT Lumbar Spine Wo Contrast  Result Date: 09/08/2023 CLINICAL DATA:  Back trauma, no prior imaging (Age >= 16y). Low back pain since a fall 10 days ago. History of prostate cancer. EXAM: CT LUMBAR SPINE WITHOUT CONTRAST TECHNIQUE: Multidetector CT  imaging of the lumbar spine was performed without intravenous contrast administration. Multiplanar CT image reconstructions were also generated. RADIATION DOSE REDUCTION: This exam was performed according to the departmental dose-optimization program which includes automated exposure control, adjustment of the mA and/or kV according to patient size and/or use of iterative reconstruction technique. COMPARISON:  Lumbar spine radiographs 08/29/2023. CT abdomen and pelvis 05/20/2022. PET-CT 06/22/2023. FINDINGS: Segmentation: 5 lumbar type vertebrae. Alignment: Mild-to-moderate thoracolumbar dextroscoliosis. Trace anterolisthesis of L4 on L5. Vertebrae: Diffuse osteopenia. Acute fractures involving the T11 inferior and T12 superior endplates and vertebral syndesmophytes with at most minimal displacement and no definitive posterior element fracture. Interbody ankylosis from T9-T11 and T12-L2. Partial ankylosis of the SI joints. Sclerotic lesions in the posteromedial right tenth rib, right T11 transverse process, and right L2 transverse process, similar to the prior PET-CT. Paraspinal and other soft tissues: Cholecystectomy. Aortoiliac stent graft. Emphysema. Aortic atherosclerosis. Dependent atelectasis in the lungs, likely with small left and possible trace right pleural effusions. Disc levels: Mild disc degeneration and advanced facet arthrosis in the lower lumbar spine. No evidence of high-grade stenosis. IMPRESSION: 1. Acute, at most minimally displaced fractures through the T11-12 disc space and adjacent vertebral bodies. 2. 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 3. Known small sclerotic bone metastases. 4.  Aortic Atherosclerosis (ICD10-I70.0). Electronically Signed   By: Sebastian Ache M.D.   On: 09/08/2023 16:28     Family Communication: Discussed with daughter at bedside, she understand and agree. All questions answereed.    Disposition: Status is: Inpatient Remains  inpatient appropriate because: pain control, rehab placement.  Planned Discharge Destination: Skilled nursing facility and Rehab     Time spent: 40 minutes  Author: Marcelino Duster, MD 09/10/2023 3:02 PM Secure chat 7am to 7pm For on call review [email or web address].

## 2023-09-10 NOTE — Evaluation (Signed)
Clinical/Bedside Swallow Evaluation Patient Details  Name: Steven Elliott MRN: [id number] Date of Birth: 12-09-1929  Today's Date: 09/10/2023 Time: SLP Start Time (ACUTE ONLY): 0849 SLP Stop Time (ACUTE ONLY): 0905 SLP Time Calculation (min) (ACUTE ONLY): 16 min  Past Medical History:  Past Medical History:  Diagnosis Date   AKI (acute kidney injury) (HCC) 05/21/2022   Allergic rhinitis    Aneurysm of infrarenal abdominal aorta (HCC) 02/2015   5.4 cm by CT 03/20/16.    Anosmia    BPH (benign prostatic hyperplasia)    COPD (chronic obstructive pulmonary disease) (HCC)    Diverticulosis    Emphysema (subcutaneous) (surgical) resulting from a procedure    severe by CT 03/20/16   GERD (gastroesophageal reflux disease)    History of bilateral inguinal hernias 02/2015   Hypertension    Peripheral vascular disease (HCC) 02/2015   heavily calcified but patent bil iliac arteries by CTA 03/20/16.   Past Surgical History:  Past Surgical History:  Procedure Laterality Date   APPENDECTOMY     CHOLECYSTECTOMY     COLONOSCOPY  ~ 2007   at [facility]   SKIN CANCER EXCISION     TOTAL KNEE ARTHROPLASTY Left 2014   HPI:  89 y.o. male with medical history significant for dementia, chronic kidney disease, COPD, hyponatremia prostate cancer with metastasis to the bone admitted with fall resulting in T11-12 fx. Neurosurgery recommending TLSO brace and conservative care. CT Abdomen Pelvis revealed 1. 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 In retrospect this was present on the PET-CT dated 06/22/2023. There is no abnormal small bowel wall thickening or surrounding inflammatory fat stranding. No sign of portal venous gas. Absence of significant findings like bowel wall thickening, inflammation, free fluid, and pneumoperitoneum suggests that the pneumatosis is not associated with an acute or severe pathological process like acute ischemia, perforation,  or infection. Findings may be related to factors such as benign small bowel pneumatosis, which may be related to factors such as chronic mechanical stress (constipation), subclinical mesenteric ischemia, vascular changes from AAA repair, or radiation effects (if applicable). Advise close clinical monitoring and follow-up imaging (CT mesenteric angiogram) if there are clinical concerns for  mesenteric ischemia 2. Within the left upper quadrant of the abdomen there are several loops of small bowel which are increased in caliber measuring up to  3.7 cm with normal caliber mid and distal small bowel loops. Findings are favored to represent ileus. 3. Status post stent graft repair of abdominal aortic aneurysm. Aneurysm sac measures 4.5 x 3.6 cm. Unchanged from 06/22/2023. 4. 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 Rigid spine fracture across the T11-T12 level as described on lumbar spine CT from 09/08/23. 5. 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 6. Aortic Atherosclerosis (ICD10-I70.0) and Emphysema (ICD10-J43.9).    Assessment / Plan / Recommendation  Clinical Impression  Pt presents as confused, increased WOB d/t recent pain after OT session, excessive talking despite cues to cease talking during PO consumption, inability to follow directions and highly distractible. His daughter was at bedside. Education provided to both pt and his daughter on need to reduce distractions and talking when consuming POs. When consuming ice chips, thin liquids via spoon and straw as well as puree and soft solids, pt demonstrated adequate oropharyngeal abilities. Again, his inability to cease talking places him at an increased risk of aspiration but thickening pt's liquid  won't solve this issue. Recommend dysphagia 2 diet for energy conservation and short oral prep in the setting of AMS. Also recommend thin liquids via straw with full supervision  during all PO consumption. Will defer further diet upgrade to next venue of care as pt's position ing and breathing will likely fluctuate based on pain and AMS. SLP Visit Diagnosis: Dysphagia, unspecified (R13.10)    Aspiration Risk  Moderate aspiration risk    Diet Recommendation Dysphagia 2 (Fine chop);Thin liquid    Liquid Administration via: Cup;Straw Medication Administration: Whole meds with liquid Supervision: Full supervision/cueing for compensatory strategies Compensations: Minimize environmental distractions;Slow rate;Small sips/bites Postural Changes: Seated upright at 90 degrees;Remain upright for at least 30 minutes after po intake    Other  Recommendations Oral Care Recommendations: Oral care BID    Recommendations for follow up therapy are one component of a multi-disciplinary discharge planning process, led by the attending physician.  Recommendations may be updated based on patient status, additional functional criteria and insurance authorization.  Follow up Recommendations No SLP follow up      Assistance Recommended at Discharge    Functional Status Assessment Patient has had a recent decline in their functional status and/or demonstrates limited ability to make significant improvements in function in a reasonable and predictable amount of time  Frequency and Duration            Prognosis Prognosis for improved oropharyngeal function: Fair Barriers to Reach Goals: Cognitive deficits;Medication (pain medication)      Swallow Study   General Date of Onset: 09/08/23 HPI: 89 y.o. male with medical history significant for dementia, chronic kidney disease, COPD, hyponatremia prostate cancer with metastasis to the bone admitted with fall resulting in T11-12 fx. Neurosurgery recommending TLSO brace and conservative care. CT Abdomen Pelvis revealed 1. 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 In retrospect this was  present on the PET-CT dated 06/22/2023. There is no abnormal small bowel wall thickening or surrounding inflammatory fat stranding. No sign of portal venous gas. Absence of significant findings like bowel wall thickening, inflammation, free fluid, and pneumoperitoneum suggests that the pneumatosis is not associated with an acute or severe pathological process like acute ischemia, perforation, or infection. Findings may be related to factors such as benign small bowel pneumatosis, which may be related to factors such as chronic mechanical stress (constipation), subclinical mesenteric ischemia, vascular changes from AAA repair, or radiation effects (if applicable). Advise close clinical monitoring and follow-up imaging (CT mesenteric angiogram) if there are clinical concerns for  mesenteric ischemia 2. Within the left upper quadrant of the abdomen there are several loops of small bowel which are increased in caliber measuring up to  3.7 cm with normal caliber mid and distal small bowel loops. Findings are favored to represent ileus. 3. Status post stent graft repair of abdominal aortic aneurysm. Aneurysm sac measures 4.5 x 3.6 cm. Unchanged from 06/22/2023. 4. 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 Rigid spine fracture across the T11-T12 level as described on lumbar spine CT from 09/08/23. 5. 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 6. Aortic Atherosclerosis (ICD10-I70.0) and Emphysema (ICD10-J43.9). Type of Study: Bedside Swallow Evaluation Previous Swallow Assessment: none in chart Diet Prior to this Study: NPO Temperature Spikes Noted: No Respiratory Status: Nasal cannula History of Recent Intubation: No Behavior/Cognition: Alert;Distractible;Requires cueing;Doesn't follow directions;Confused Oral Cavity Assessment: Within Functional Limits Oral Care Completed by SLP:  No Oral Cavity - Dentition: Dentures, top;Dentures,  bottom Vision: Functional for self-feeding Self-Feeding Abilities: Needs assist Patient Positioning: Upright in bed Baseline Vocal Quality: Normal Volitional Cough: Strong Volitional Swallow: Able to elicit    Oral/Motor/Sensory Function Overall Oral Motor/Sensory Function: Within functional limits   Ice Chips Ice chips: Within functional limits Presentation: Spoon   Thin Liquid Thin Liquid: Within functional limits Presentation: Cup;Self Fed;Spoon;Straw    Nectar Thick Nectar Thick Liquid: Not tested   Honey Thick Honey Thick Liquid: Not tested   Puree Puree: Within functional limits Presentation: Spoon   Solid     Solid: Within functional limits Other Comments: trialed dysphagia 2 textures d/t decreased ability to follow commands and pt's unceasing talking during consumption leading to increased work of breathing     Steven Elliott B. Dreama Saa, M.S., CCC-SLP, Tree surgeon Certified Brain Injury Specialist [facility]  [facility] Rehabilitation Services Office [phone] Ascom [phone] Fax [phone]

## 2023-09-11 ENCOUNTER — Inpatient Hospital Stay: Payer: No Typology Code available for payment source

## 2023-09-11 LAB — URINE CULTURE: Culture: >=100000 — AB

## 2023-09-11 MED ORDER — ACETAMINOPHEN 325 MG PO TABS
650.0000 mg | ORAL_TABLET | Freq: Four times a day (QID) | ORAL | Status: DC | PRN
  Administered 2023-09-11 – 2023-09-13 (×4): 650 mg via ORAL
  Filled 2023-09-11 (×5): qty 2

## 2023-09-11 MED ORDER — HALOPERIDOL LACTATE 5 MG/ML IJ SOLN
5.0000 mg | Freq: Four times a day (QID) | INTRAMUSCULAR | Status: AC | PRN
  Administered 2023-09-11 – 2023-09-13 (×3): 5 mg via INTRAVENOUS
  Filled 2023-09-11 (×4): qty 1

## 2023-09-11 NOTE — [place] FL2 (Signed)
[place] MEDICAID FL2 LEVEL OF CARE FORM     IDENTIFICATION  Patient Name: Steven Elliott Birthdate: 1930/01/28 Sex: male Admission Date (Current Location): 09/08/2023  [facility] and [place] Number:  Chiropodist and Address:  [facility], [address], [place], [place] [address]      Provider Number: [id number]  Attending Physician Name and Address:  Marcelino Duster, MD  Relative Name and Phone Number:  [name] (Daughter)  [phone]    Current Level of Care: SNF Recommended Level of Care: Skilled Nursing Facility Prior Approval Number:    Date Approved/Denied:   PASRR Number: [id number]  Discharge Plan: SNF    Current Diagnoses: Patient Active Problem List   Diagnosis Date Noted   Cerebral aneurysm, nonruptured 09/09/2023   Essential (primary) hypertension 09/09/2023   Oropharyngeal dysphagia 09/09/2023   Anemia 09/09/2023   Fracture dislocation of thoracic spine, closed, initial encounter (HCC) 09/09/2023   Closed wedge compression fracture of T11 vertebra (HCC) 09/09/2023   Compression fracture of T12 vertebra (HCC) 09/09/2023   Fall 09/08/2023   Orthostatic hypotension 05/21/2022   Wheeze 05/21/2022   Hyponatremia 05/21/2022   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/20/2022   Acute urinary retention 05/20/2022   Urethritis 05/11/2022   Balanitis 05/10/2022   UTI (urinary tract infection) 05/10/2022   BPH (benign prostatic hyperplasia)    Dementia (HCC)    Abnormal LFTs    COPD (chronic obstructive pulmonary disease) (HCC)    Chronic kidney disease, stage 3a (HCC)    Pneumoperitoneum    Diverticular hemorrhage 03/22/2016   Abdominal aortic aneurysm (HCC) 03/22/2016   Rectal bleeding 03/20/2016    Orientation RESPIRATION BLADDER Height & Weight     Self  O2 Incontinent Weight: 79.4 kg Height:  5\' 8"  (172.7 cm)  BEHAVIORAL SYMPTOMS/MOOD NEUROLOGICAL BOWEL NUTRITION STATUS      Incontinent  Diet  AMBULATORY STATUS COMMUNICATION OF NEEDS Skin   Extensive Assist Verbally Normal                       Personal Care Assistance Level of Assistance  Bathing, Dressing Bathing Assistance: Limited assistance   Dressing Assistance: Limited assistance     Functional Limitations Info             SPECIAL CARE FACTORS FREQUENCY  PT (By licensed PT), OT (By licensed OT)     PT Frequency: 5 times a week OT Frequency: 5 times a week            Contractures Contractures Info: Not present    Additional Factors Info  Code Status, Allergies Code Status Info: Full Allergies Info: Procaine High  Anaphylaxis   Ciprofloxacin Not Specified  Other (See Comments), Nausea And Vomiting Malaise, Rigor  Hydrocodone Not Specified   Other reaction(s): Rash, Hives, Heart palpitations ()  Hydrocodone-acetaminophen Not Specified  Itching Watery eyes  Lisinopril Not Specified  Other (See Comments)   Omeprazole Not Specified  Other (See Comments) GI reaction  Penicillins Not Specified  Other (See Comments)   Rabeprazole           Current Medications (09/11/2023):  This is the current hospital active medication list Current Facility-Administered Medications  Medication Dose Route Frequency Provider Last Rate Last Admin   albuterol (PROVENTIL) (2.5 MG/3ML) 0.083% nebulizer solution 2.5 mg  2.5 mg Inhalation Q6H PRN Gertha Calkin, MD       amLODipine (NORVASC) tablet 5 mg  5 mg Oral Daily  Delfino Lovett, MD   5 mg at 09/10/23 1042   darolutamide (NUBEQA) tablet 600 mg  600 mg Oral BID WC Irena Cords V, MD   600 mg at 09/11/23 1032   DULoxetine (CYMBALTA) DR capsule 20 mg  20 mg Oral BID Gertha Calkin, MD   20 mg at 09/11/23 1029   famotidine (PEPCID) tablet 20 mg  20 mg Oral QHS Irena Cords V, MD   20 mg at 09/10/23 2159   fluticasone (FLONASE) 50 MCG/ACT nasal spray 1 spray  1 spray Each Nare Daily PRN Gertha Calkin, MD       gabapentin (NEURONTIN) capsule 100 mg  100 mg Oral QHS Irena Cords  V, MD   100 mg at 09/10/23 2159   galantamine (RAZADYNE ER) 24 hr capsule 16 mg  16 mg Oral Q breakfast Irena Cords V, MD   16 mg at 09/11/23 1030   guaiFENesin (ROBITUSSIN) 100 MG/5ML liquid 5 mL  5 mL Oral Q4H PRN Marcelino Duster, MD   5 mL at 09/10/23 1403   haloperidol lactate (HALDOL) injection 5 mg  5 mg Intravenous Q6H PRN Marcelino Duster, MD   5 mg at 09/11/23 1119   heparin injection 5,000 Units  5,000 Units Subcutaneous Q12H Irena Cords V, MD   5,000 Units at 09/11/23 1027   ipratropium (ATROVENT) 0.06 % nasal spray 2 spray  2 spray Each Nare BID PRN Gertha Calkin, MD       ketorolac (TORADOL) 15 MG/ML injection 15 mg  15 mg Intravenous Q6H PRN Delfino Lovett, MD   15 mg at 09/11/23 1119   lidocaine (LIDODERM) 5 % 1 patch  1 patch Transdermal Q24H Evon Slack, PA-C   1 patch at 09/10/23 1823   memantine (NAMENDA) tablet 10 mg  10 mg Oral BID Gertha Calkin, MD   10 mg at 09/11/23 1030   polyethylene glycol (MIRALAX / GLYCOLAX) packet 17 g  17 g Oral Daily Delfino Lovett, MD   17 g at 09/11/23 1027   risperiDONE (RISPERDAL) tablet 0.5 mg  0.5 mg Oral QHS Marcelino Duster, MD   0.5 mg at 09/10/23 2159   senna-docusate (Senokot-S) tablet 2 tablet  2 tablet Oral BID Delfino Lovett, MD   2 tablet at 09/11/23 1028   sodium chloride flush (NS) 0.9 % injection 3 mL  3 mL Intravenous Q12H Irena Cords V, MD   3 mL at 09/11/23 1031   tamsulosin (FLOMAX) capsule 0.4 mg  0.4 mg Oral QHS Gertha Calkin, MD   0.4 mg at 09/10/23 2158     Discharge Medications: Please see discharge summary for a list of discharge medications.  Relevant Imaging Results:  Relevant Lab Results:   Additional Information SS# [id number], [place]

## 2023-09-11 NOTE — Plan of Care (Signed)

## 2023-09-11 NOTE — Progress Notes (Signed)
Progress Note   Patient: Steven Elliott ZOX:[id number] DOB: 08-Jun-1930 DOA: 09/08/2023     2 DOS: the patient was seen and examined on 09/11/2023   Brief hospital course: 89 y.o. male with medical history significant for dementia, chronic kidney disease, COPD, hyponatremia prostate cancer with metastasis to the bone admitted with fall resulting in T11-12 fx.   Neurosurgery team advised TLSO brace, pain control, PT/ OT. He is on and off agitated and restless, confused, trying get out of bed. Pain better.  Assessment and Plan: T11-12 fractures status post fall -underlying ankylosing spondylitis Neurosurgery advised TLSO brace when out of bed. TLSO when out of bed. PT and OT advised rehab. Toradol, Tylenol PRN ordered. Outpatient neurosurgery follow-up. Daughter would like to take him home with [facility].   Abnormal LFTs Total bili, direct/ indirect bili up. RUQ sono ordered.    COPD (chronic obstructive pulmonary disease) (HCC) Stable, on room air. Duonebs as needed.   Dementia (HCC) At baseline. Gets confused at times. Avoid opiates, sedatives as he is high risk for confusion, falls Delirium precautions.   Anemia of chronic disease Stable H&H, monitor   Essential (primary) hypertension BP stable. Continue Norvasc(new med)   Cerebral aneurysm, nonruptured CT head shows stable aneurysm   Prostate cancer metastatic to bone (HCC) On home med Nubeqa.      Out of bed to chair. Incentive spirometry. Nursing supportive care. Fall, aspiration precautions. DVT prophylaxis   Code Status: Full Code Palliative care on board to discuss goals of care.  Subjective: Patient is seen and examined today morning. He is alert, awake and pleasant today. He tried to go to bathroom after haldol today. Has RUQ, right flank pain per daughter at bedside. Patient states pain better.   Physical Exam: Vitals:   09/11/23 0048 09/11/23 0500 09/11/23 0513 09/11/23 0800  BP: 123/74  (!)  140/62 (!) 107/55  Pulse: 98  93 95  Resp: 18  18 16   Temp: 98.1 F (36.7 C)  (!) 97.4 F (36.3 C) 97.8 F (36.6 C)  TempSrc:      SpO2: 94%  96% 99%  Weight:  79.4 kg    Height:        General - Elderly Caucasian male, no apparent distress HEENT - PERRLA, EOMI, atraumatic head, non tender sinuses. Lung - Clear, basal rhonchi, no wheezes. Heart - S1, S2 heard, no murmurs, rubs, trace pedal edema. Abdomen - Soft, non tender, non distended, bowel sounds good Neuro - Alert, awake and confused, non focal exam. Skin - Warm and dry.  Data Reviewed:      Latest Ref Rng & Units 09/10/2023    5:43 AM 09/09/2023   10:03 AM 09/08/2023    1:42 PM  CBC  WBC 4.0 - 10.5 K/uL 7.2  9.0  9.9   Hemoglobin 13.0 - 17.0 g/dL 81.1  91.4  78.2   Hematocrit 39.0 - 52.0 % 34.2  37.5  38.4   Platelets 150 - 400 K/uL 237  219  217       Latest Ref Rng & Units 09/10/2023    5:43 AM 09/09/2023   10:03 AM 09/08/2023    5:17 PM  BMP  Glucose 70 - 99 mg/dL 77  956  213   BUN 8 - 23 mg/dL 18  13  15    Creatinine 0.61 - 1.24 mg/dL 0.86  5.78  4.69   Sodium 135 - 145 mmol/L 140  136  134   Potassium 3.5 - 5.1  mmol/L 3.8  3.6  3.9   Chloride 98 - 111 mmol/L 106  103  102   CO2 22 - 32 mmol/L 22  23  23    Calcium 8.9 - 10.3 mg/dL 7.9  8.0  8.3    DG Chest 1 View Result Date: 09/10/2023 CLINICAL DATA:  Hypoxia. EXAM: CHEST  1 VIEW COMPARISON:  Chest radiograph dated 05/22/2022 and CT dated 09/09/2023. FINDINGS: Background of emphysema. There are bibasilar atelectasis/scarring. No focal consolidation, pleural effusion, pneumothorax. Dilatation of the pulmonary arteries suggestive of pulmonary hypertension. Atherosclerotic calcification of the aorta. No acute osseous pathology. IMPRESSION: 1. No active disease. 2. Emphysema and findings suggestive of pulmonary hypertension. Electronically Signed   By: Elgie Collard M.D.   On: 09/10/2023 20:08     Family Communication: Discussed with daughter at  bedside, she understand and agree. All questions answereed.  Disposition: Status is: Inpatient Remains inpatient appropriate because: pain control, rehab placement.  Planned Discharge Destination: Home with Home Health and Skilled nursing facility     Time spent: 38 minutes  Author: Marcelino Duster, MD 09/11/2023 3:31 PM Secure chat 7am to 7pm For on call review [email or web address].

## 2023-09-11 NOTE — Plan of Care (Signed)
  Problem: Education: Goal: Knowledge of General Education information will improve Description: Including pain rating scale, medication(s)/side effects and non-pharmacologic comfort measures Outcome: Progressing   Problem: Clinical Measurements: Goal: Will remain free from infection Outcome: Progressing Goal: Respiratory complications will improve Outcome: Progressing Goal: Cardiovascular complication will be avoided Outcome: Progressing   Problem: Activity: Goal: Risk for activity intolerance will decrease Outcome: Progressing   Problem: Coping: Goal: Level of anxiety will decrease Outcome: Progressing   Problem: Elimination: Goal: Will not experience complications related to bowel motility Outcome: Progressing Goal: Will not experience complications related to urinary retention Outcome: Progressing   Problem: Pain Management: Goal: General experience of comfort will improve Outcome: Progressing   Problem: Safety: Goal: Ability to remain free from injury will improve Outcome: Progressing

## 2023-09-11 NOTE — Progress Notes (Signed)
Physical Therapy Treatment Patient Details Name: Steven Elliott MRN: [id number] DOB: 09-21-1930 Today's Date: 09/11/2023   History of Present Illness 89 y.o. male with medical history significant for dementia, chronic kidney disease, COPD, hyponatremia prostate cancer with metastasis to the bone admitted with fall resulting in T11-12 fx. TLSO when OOB.    PT Comments  Pt is making progress with mobility demonstrating less distraction due to back pain.  Pt able to maintain focused attention on bed mobility (Min A), transfers with RW Min A, and gait RW CGA 20 ft with min cues for safe technique.  Pt continues to be limited by pain and fatigue ( at end of ambulation pt was SOB, SPO2 88% on 3 LO2 needing seated rest to increase SPO2 > 90%.).  Continued PT will assist pt toward greater activity tolerance and balance with fucntional mobility.   If plan is discharge home, recommend the following: A lot of help with walking and/or transfers;A lot of help with bathing/dressing/bathroom;Assistance with cooking/housework;Assist for transportation;Help with stairs or ramp for entrance   Can travel by private vehicle     No  Equipment Recommendations  BSC/3in1;Wheelchair (measurements PT);Wheelchair cushion (measurements PT)    Recommendations for Other Services       Precautions / Restrictions Precautions Precautions: Fall Required Braces or Orthoses: Spinal Brace Spinal Brace: Thoracolumbosacral orthotic;Applied in sitting position Restrictions Weight Bearing Restrictions Per Provider Order: No     Mobility  Bed Mobility Overal bed mobility: Needs Assistance Bed Mobility: Rolling, Sidelying to Sit Rolling: Min assist Sidelying to sit: Min assist       General bed mobility comments: Pt seemed to have less pain and was more focused on task with min cues.    Transfers Overall transfer level: Needs assistance Equipment used: Rolling walker (2 wheels) Transfers: Sit to/from Stand,  Bed to chair/wheelchair/BSC Sit to Stand: From elevated surface, Min assist           General transfer comment: Min A for STS from EOB and verb cues to push up from the bed with one hand.    Ambulation/Gait Ambulation/Gait assistance: Contact guard assist Gait Distance (Feet): 20 Feet Assistive device: Rolling walker (2 wheels) Gait Pattern/deviations: Decreased step length - right, Decreased step length - left, Step-through pattern           Stairs             Wheelchair Mobility     Tilt Bed    Modified Rankin (Stroke Patients Only)       Balance Overall balance assessment: Needs assistance Sitting-balance support: Bilateral upper extremity supported, Feet supported Sitting balance-Leahy Scale: Fair Sitting balance - Comments: supervision sitting EOB, able to scoot forward to EOB with CGA.   Standing balance support: Bilateral upper extremity supported, During functional activity, Reliant on assistive device for balance Standing balance-Leahy Scale: Fair Standing balance comment: needs Min A to maintain standing balance                            Cognition Arousal: Alert Behavior During Therapy: Restless Overall Cognitive Status: History of cognitive impairments - at baseline                                          Exercises      General Comments General comments (skin integrity, edema, etc.): TLSO  donned once seated EOB and adjusted in standing for increased comfort . Educated DTR and nurse on how to assist pt with donning TLSO and adjust for comfort.      Pertinent Vitals/Pain Pain Assessment Breathing: occasional labored breathing, short period of hyperventilation Negative Vocalization: occasional moan/groan, low speech, negative/disapproving quality Facial Expression: facial grimacing Body Language: tense, distressed pacing, fidgeting Consolability: distracted or reassured by voice/touch PAINAD Score: 6 Pain  Location: back Pain Descriptors / Indicators: Aching, Sore, Shooting Pain Intervention(s): Premedicated before session    Home Living                          Prior Function            PT Goals (current goals can now be found in the care plan section) Acute Rehab PT Goals Patient Stated Goal: be able to physically manage pt at home. PT Goal Formulation: With family Time For Goal Achievement: 09/24/23 Potential to Achieve Goals: Good Progress towards PT goals: Progressing toward goals    Frequency    7X/week      PT Plan      Co-evaluation              AM-PAC PT "6 Clicks" Mobility   Outcome Measure  Help needed turning from your back to your side while in a flat bed without using bedrails?: A Lot Help needed moving from lying on your back to sitting on the side of a flat bed without using bedrails?: A Lot Help needed moving to and from a bed to a chair (including a wheelchair)?: A Lot Help needed standing up from a chair using your arms (e.g., wheelchair or bedside chair)?: A Lot Help needed to walk in hospital room?: A Little Help needed climbing 3-5 steps with a railing? : A Lot 6 Click Score: 13    End of Session Equipment Utilized During Treatment: Oxygen;Gait belt Activity Tolerance: Patient limited by fatigue;Patient limited by pain Patient left: in bed;with call bell/phone within reach Nurse Communication: Mobility status PT Visit Diagnosis: Difficulty in walking, not elsewhere classified (R26.2);Other abnormalities of gait and mobility (R26.89)     Time: 0272-5366 PT Time Calculation (min) (ACUTE ONLY): 32 min  Charges:    $Therapeutic Activity: 23-37 mins PT General Charges $$ ACUTE PT VISIT: 1 Visit                     Hortencia Conradi, PTA  09/11/23, 1:36 PM

## 2023-09-11 NOTE — TOC Progression Note (Signed)
Transition of Care [facility]) - Progression Note    Patient Details  Name: Steven Elliott MRN: [id number] Date of Birth: Mar 18, 1930  Transition [facility] Care [facility]) CM/SW Contact  Rodney Langton, RN Phone Number: 09/11/2023, 2:03 PM  Clinical Narrative:     Recommendations for SNF for short term rehab.  Spoke with daughter [name], she is hesitant to agree, stating she does not like the fact that they do not use bedrails and is afraid he will fall again.  She agrees to bed search at [facility] and [facility] only.  He was active with home health for therapy through [facility] prior to admission, if he is not accepted at [facility] or [facility], she will consider taking him back home.  He does have hospital bed at home.  TOC will continue to follow.        Expected Discharge Plan and Services                                               Social Determinants of Health (SDOH) Interventions SDOH Screenings   Food Insecurity: No Food Insecurity (09/09/2023)  Housing: Low Risk  (09/09/2023)  Transportation Needs: No Transportation Needs (09/09/2023)  Utilities: Not At Risk (09/09/2023)  Tobacco Use: Medium Risk (09/08/2023)    Readmission Risk Interventions    09/09/2023    1:41 PM  Readmission Risk Prevention Plan  Transportation Screening Complete  PCP or Specialist Appt within 3-5 Days Complete  HRI or Home Care Consult Complete  Social Work Consult for Recovery Care Planning/Counseling Complete  Palliative Care Screening Complete  Medication Review Oceanographer) Complete

## 2023-09-12 NOTE — Progress Notes (Signed)
Progress Note   Patient: Steven Elliott VWU:[id number] DOB: 06/06/1930 DOA: 09/08/2023     3 DOS: the patient was seen and examined on 09/12/2023   Brief hospital course: 89 y.o. male with medical history significant for dementia, chronic kidney disease, COPD, hyponatremia prostate cancer with metastasis to the bone admitted with fall resulting in T11-12 fx.   Neurosurgery team advised TLSO brace, pain control, PT/ OT. He is on and off agitated and restless, confused, trying get out of bed. Pain better. Last 2 days mental status better, wishes to go home.   Assessment and Plan: T11-12 fractures status post fall -underlying ankylosing spondylitis Neurosurgery advised TLSO brace which he refuses to wear when out of bed.  PT and OT follow up, patient wishes to go home with [facility]. Toradol, Tylenol PRN ordered. Outpatient neurosurgery follow-up.   Abnormal LFTs Total bili, direct/ indirect bili up. RUQ sono unremarkable.    COPD (chronic obstructive pulmonary disease) (HCC) On and off hypoxia noted. Encourage incentive spirometry, out of bed. May need home O2 qualification prior to dc. Duonebs as needed.   Dementia (HCC) At baseline. Gets confused and agitated on and off.  Avoid opiates, sedatives as he is high risk for confusion, falls Delirium precautions.   Anemia of chronic disease Stable H&H, monitor   Essential (primary) hypertension BP stable. Continue Norvasc(new med)   Cerebral aneurysm, nonruptured CT head shows stable aneurysm   Prostate cancer metastatic to bone (HCC) On home med Nubeqa.    Out of bed to chair. Incentive spirometry. Nursing supportive care. Fall, aspiration precautions. DVT prophylaxis- heparin   Code Status: Full Code Palliative care on board to discuss goals of care.  Subjective: Patient is seen and examined today morning. He is sitting in chair, alert, awake. He complained of back pain. I advised TLSO brace which he refuses citing  discomfort. Eating fair, wishes to go home.  No family at bedside.  Physical Exam: Vitals:   09/12/23 0438 09/12/23 0447 09/12/23 0450 09/12/23 0808  BP: 116/63   (!) 151/86  Pulse: 98   (!) 109  Resp: 20   17  Temp: (!) 96.7 F (35.9 C) 97.6 F (36.4 C)  97.6 F (36.4 C)  TempSrc:      SpO2: 93%   96%  Weight:   79.4 kg   Height:        General - Elderly Caucasian male, sitting in mild distress due to pain. HEENT - PERRLA, EOMI, atraumatic head, non tender sinuses. Lung - Clear, basal rhonchi, no wheezes. Heart - S1, S2 heard, no murmurs, rubs, no pedal edema. Abdomen - Soft, non tender, non distended, bowel sounds good Neuro - Alert, awake and confused, non focal exam. Skin - Warm and dry.  Data Reviewed:      Latest Ref Rng & Units 09/10/2023    5:43 AM 09/09/2023   10:03 AM 09/08/2023    1:42 PM  CBC  WBC 4.0 - 10.5 K/uL 7.2  9.0  9.9   Hemoglobin 13.0 - 17.0 g/dL 29.5  62.1  30.8   Hematocrit 39.0 - 52.0 % 34.2  37.5  38.4   Platelets 150 - 400 K/uL 237  219  217       Latest Ref Rng & Units 09/10/2023    5:43 AM 09/09/2023   10:03 AM 09/08/2023    5:17 PM  BMP  Glucose 70 - 99 mg/dL 77  657  846   BUN 8 - 23 mg/dL 18  13  15   Creatinine 0.61 - 1.24 mg/dL 4.09  8.11  9.14   Sodium 135 - 145 mmol/L 140  136  134   Potassium 3.5 - 5.1 mmol/L 3.8  3.6  3.9   Chloride 98 - 111 mmol/L 106  103  102   CO2 22 - 32 mmol/L 22  23  23    Calcium 8.9 - 10.3 mg/dL 7.9  8.0  8.3    US Abdomen Limited RUQ (LIVER/GB) Result Date: 09/11/2023 CLINICAL DATA:  History of cholecystectomy presenting with right upper quadrant pain. EXAM: ULTRASOUND ABDOMEN LIMITED RIGHT UPPER QUADRANT COMPARISON:  None Available. FINDINGS: Gallbladder: The gallbladder is surgically absent. Common bile duct: Diameter: 3.3 mm Liver: No focal lesion identified. Diffusely increased echogenicity of the liver parenchyma is noted. Portal vein is patent on color Doppler imaging with normal direction  of blood flow towards the liver. Other: Limited study secondary to overlying bowel gas as well as the inability of the patient to hold his breath for optimal imaging. IMPRESSION: 1. Findings consistent with history of prior cholecystectomy. 2. Hepatic steatosis without focal liver lesions. Electronically Signed   By: Aram Candela M.D.   On: 09/11/2023 18:21   DG Chest 1 View Result Date: 09/10/2023 CLINICAL DATA:  Hypoxia. EXAM: CHEST  1 VIEW COMPARISON:  Chest radiograph dated 05/22/2022 and CT dated 09/09/2023. FINDINGS: Background of emphysema. There are bibasilar atelectasis/scarring. No focal consolidation, pleural effusion, pneumothorax. Dilatation of the pulmonary arteries suggestive of pulmonary hypertension. Atherosclerotic calcification of the aorta. No acute osseous pathology. IMPRESSION: 1. No active disease. 2. Emphysema and findings suggestive of pulmonary hypertension. Electronically Signed   By: Elgie Collard M.D.   On: 09/10/2023 20:08     Family Communication: Discussed with patient, he understand and agree. All questions answereed.  Disposition: Status is: Inpatient Remains inpatient appropriate because: pain control, rehab placement.  Planned Discharge Destination: Home with Home Health     Time spent: 37 minutes  Author: Marcelino Duster, MD 09/12/2023 2:09 PM Secure chat 7am to 7pm For on call review [email or web address].

## 2023-09-12 NOTE — Progress Notes (Signed)
Physical Therapy Treatment Patient Details Name: Steven Elliott MRN: [id number] DOB: [birth year] Today's Date: 09/12/2023   History of Present Illness 89 y.o. male with medical history significant for dementia, chronic kidney disease, COPD, hyponatremia prostate cancer with metastasis to the bone admitted with fall resulting in T11-12 fx. TLSO when OOB.    PT Comments  Pt in chair, O2 off on room air 91%.  He is unsure who took it off or if he did.  He is encouraged but refuses to wear TLSO, educated on why it is important but he continues to decline stating "I have worn them before and they do not help."  Remained firm in his refusal to don.  Stands with CGA/min a x 1 and is able to walk 90' with RW and CGA x 1 for safety.  Cues for walker use and navigation.  He denied SOB but does read 87% on room air for about 2 minutes with cues for deep breathing before returning to baseline 91% on room air.  Discussed with RN.     If plan is discharge home, recommend the following: A little help with walking and/or transfers;A little help with bathing/dressing/bathroom;Assistance with cooking/housework;Assist for transportation;Help with stairs or ramp for entrance   Can travel by private vehicle        Equipment Recommendations  Rolling walker (2 wheels);BSC/3in1    Recommendations for Other Services       Precautions / Restrictions Precautions Precautions: Fall Required Braces or Orthoses: Spinal Brace Spinal Brace: Thoracolumbosacral orthotic;Applied in sitting position Restrictions Weight Bearing Restrictions Per Provider Order: No     Mobility  Bed Mobility               General bed mobility comments: in chair before and after    Transfers Overall transfer level: Needs assistance Equipment used: Rolling walker (2 wheels) Transfers: Sit to/from Stand Sit to Stand: Min assist, Contact guard assist                Ambulation/Gait Ambulation/Gait assistance: Contact  guard assist Gait Distance (Feet): 90 Feet Assistive device: Rolling walker (2 wheels) Gait Pattern/deviations: Decreased step length - right, Decreased step length - left, Step-through pattern, Trunk flexed Gait velocity: dec     General Gait Details: generally slow but +1 assit for safety with RW.  refusing TLSO   Stairs             Wheelchair Mobility     Tilt Bed    Modified Rankin (Stroke Patients Only)       Balance Overall balance assessment: Needs assistance Sitting-balance support: Bilateral upper extremity supported, Feet supported Sitting balance-Leahy Scale: Good     Standing balance support: Bilateral upper extremity supported, During functional activity, Reliant on assistive device for balance Standing balance-Leahy Scale: Fair                              Cognition Arousal: Alert Behavior During Therapy: WFL for tasks assessed/performed Overall Cognitive Status: History of cognitive impairments - at baseline                                          Exercises Other Exercises Other Exercises: seated AROM    General Comments        Pertinent Vitals/Pain Pain Assessment Pain Assessment: Faces Faces Pain Scale:  Hurts little more Pain Location: back Pain Descriptors / Indicators: Aching, Sore, Shooting Pain Intervention(s): Monitored during session, Repositioned    Home Living                          Prior Function            PT Goals (current goals can now be found in the care plan section) Progress towards PT goals: Progressing toward goals    Frequency    7X/week      PT Plan      Co-evaluation              AM-PAC PT "6 Clicks" Mobility   Outcome Measure  Help needed turning from your back to your side while in a flat bed without using bedrails?: A Little Help needed moving from lying on your back to sitting on the side of a flat bed without using bedrails?: A Little Help  needed moving to and from a bed to a chair (including a wheelchair)?: A Little Help needed standing up from a chair using your arms (e.g., wheelchair or bedside chair)?: A Little Help needed to walk in hospital room?: A Little Help needed climbing 3-5 steps with a railing? : A Little 6 Click Score: 18    End of Session Equipment Utilized During Treatment: Gait belt Activity Tolerance: Patient tolerated treatment well Patient left: in chair;with call bell/phone within reach;with chair alarm set Nurse Communication: Mobility status PT Visit Diagnosis: Difficulty in walking, not elsewhere classified (R26.2);Other abnormalities of gait and mobility (R26.89)     Time: 4696-2952 PT Time Calculation (min) (ACUTE ONLY): 10 min  Charges:    $Gait Training: 8-22 mins PT General Charges $$ ACUTE PT VISIT: 1 Visit                   Danielle Dess, PTA 09/12/23, 10:51 AM

## 2023-09-12 NOTE — Progress Notes (Addendum)
Pt back brace was applied during breakfast and pt removed it within 30 min. I have educated pt about the need for the brace and he states that he, "understands but doesn't want to wear it because it hurts and is driving him crazy." Will notify provider and re-attempt at a later time.   Also, have made several attempts at keeping nasal cannula in place but pt keeps removing it stating, "I dont need it."  Will continue to monitor with bed alarm, chair alarm and more frequent rounding.

## 2023-09-12 NOTE — TOC Progression Note (Signed)
Transition of Care [facility]) - Progression Note    Patient Details  Name: MINA METCALF MRN: [id number] Date of Birth: 08/07/1930  Transition of Care [facility]) CM/SW Contact  Hetty Ely, RN Phone Number: 09/12/2023, 9:30 AM  Clinical Narrative:  Awaiting bed offers, [facility] considering.          Expected Discharge Plan and Services                                               Social Determinants of Health (SDOH) Interventions SDOH Screenings   Food Insecurity: No Food Insecurity (09/09/2023)  Housing: Low Risk  (09/09/2023)  Transportation Needs: No Transportation Needs (09/09/2023)  Utilities: Not At Risk (09/09/2023)  Tobacco Use: Medium Risk (09/08/2023)    Readmission Risk Interventions    09/09/2023    1:41 PM  Readmission Risk Prevention Plan  Transportation Screening Complete  PCP or Specialist Appt within 3-5 Days Complete  HRI or Home Care Consult Complete  Social Work Consult for Recovery Care Planning/Counseling Complete  Palliative Care Screening Complete  Medication Review Oceanographer) Complete

## 2023-09-13 ENCOUNTER — Inpatient Hospital Stay: Payer: No Typology Code available for payment source

## 2023-09-13 DIAGNOSIS — J9601 Acute respiratory failure with hypoxia: Secondary | ICD-10-CM | POA: Diagnosis not present

## 2023-09-13 DIAGNOSIS — W19XXXA Unspecified fall, initial encounter: Secondary | ICD-10-CM | POA: Diagnosis not present

## 2023-09-13 LAB — BASIC METABOLIC PANEL
Anion gap: 8 (ref 5–15)
BUN: 24 mg/dL — ABNORMAL HIGH (ref 8–23)
CO2: 26 mmol/L (ref 22–32)
Calcium: 8.4 mg/dL — ABNORMAL LOW (ref 8.9–10.3)
Chloride: 103 mmol/L (ref 98–111)
Creatinine, Ser: 0.78 mg/dL (ref 0.61–1.24)
GFR, Estimated: >60 mL/min (ref >60)
Glucose, Bld: 155 mg/dL — ABNORMAL HIGH (ref 70–99)
Potassium: 3.8 mmol/L (ref 3.5–5.1)
Sodium: 137 mmol/L (ref 135–145)

## 2023-09-13 LAB — GLUCOSE, CAPILLARY: Glucose-Capillary: 124 mg/dL — ABNORMAL HIGH (ref 70–99)

## 2023-09-13 MED ORDER — ACETAMINOPHEN 500 MG PO TABS
1000.0000 mg | ORAL_TABLET | Freq: Three times a day (TID) | ORAL | Status: DC | PRN
  Administered 2023-09-14 – 2023-09-26 (×8): 1000 mg via ORAL
  Filled 2023-09-13 (×9): qty 2

## 2023-09-13 MED ORDER — TRAMADOL HCL 50 MG PO TABS: 50.0000 mg | ORAL_TABLET | Freq: Four times a day (QID) | ORAL | Status: DC | PRN

## 2023-09-13 MED ORDER — ACETAMINOPHEN 500 MG PO TABS: 1000.0000 mg | ORAL_TABLET | Freq: Three times a day (TID) | ORAL | Status: DC | PRN

## 2023-09-13 MED ORDER — METOPROLOL TARTRATE 50 MG PO TABS
50.0000 mg | ORAL_TABLET | Freq: Two times a day (BID) | ORAL | Status: DC
  Administered 2023-09-13 – 2023-09-26 (×23): 50 mg via ORAL
  Filled 2023-09-13 (×24): qty 1

## 2023-09-13 MED ORDER — IOHEXOL 350 MG/ML SOLN
100.0000 mL | Freq: Once | INTRAVENOUS | Status: AC | PRN
  Administered 2023-09-14: 100 mL via INTRAVENOUS

## 2023-09-13 MED ORDER — IPRATROPIUM-ALBUTEROL 0.5-2.5 (3) MG/3ML IN SOLN
3.0000 mL | Freq: Once | RESPIRATORY_TRACT | Status: AC
  Administered 2023-09-14: 3 mL via RESPIRATORY_TRACT
  Filled 2023-09-13: qty 3

## 2023-09-13 MED ORDER — ENOXAPARIN SODIUM 40 MG/0.4ML IJ SOSY
40.0000 mg | PREFILLED_SYRINGE | INTRAMUSCULAR | Status: DC
  Administered 2023-09-13 – 2023-09-26 (×14): 40 mg via SUBCUTANEOUS
  Filled 2023-09-13 (×14): qty 0.4

## 2023-09-13 MED ORDER — KETOROLAC TROMETHAMINE 15 MG/ML IJ SOLN
15.0000 mg | Freq: Three times a day (TID) | INTRAMUSCULAR | Status: DC | PRN
  Administered 2023-09-13 – 2023-09-17 (×5): 15 mg via INTRAVENOUS
  Filled 2023-09-13 (×7): qty 1

## 2023-09-13 MED ORDER — ACETAMINOPHEN 325 MG PO TABS: 650.0000 mg | ORAL_TABLET | Freq: Four times a day (QID) | ORAL | Status: DC | PRN

## 2023-09-13 NOTE — Progress Notes (Signed)
       CROSS COVER NOTE  NAME: Steven Elliott MRN: [id number] DOB : 1930/03/27    Date of Service   09/13/2023   HPI/Events of Note   RRT for SOB  Interventions   Plan: X X X       Alaric Gladwin Lamin Viktoriya Glaspy, MSN, APRN, AGACNP-BC [facility] [place] Pager: [phone]. Check Amion for Availability

## 2023-09-13 NOTE — Plan of Care (Signed)

## 2023-09-13 NOTE — Plan of Care (Signed)

## 2023-09-13 NOTE — TOC Progression Note (Signed)
Transition of Care [facility]) - Progression Note    Patient Details  Name: Steven Elliott MRN: [id number] Date of Birth: 06/18/1930  Transition of Care [facility]) CM/SW Contact  Allena Katz, LCSW Phone Number: 09/13/2023, 12:05 PM  Clinical Narrative:   Message left with daughter [name] that [facility] unable to take pt. CSW will wait for a return call         Expected Discharge Plan and Services                                               Social Determinants of Health (SDOH) Interventions SDOH Screenings   Food Insecurity: No Food Insecurity (09/09/2023)  Housing: Low Risk  (09/09/2023)  Transportation Needs: No Transportation Needs (09/09/2023)  Utilities: Not At Risk (09/09/2023)  Tobacco Use: Medium Risk (09/08/2023)    Readmission Risk Interventions    09/09/2023    1:41 PM  Readmission Risk Prevention Plan  Transportation Screening Complete  PCP or Specialist Appt within 3-5 Days Complete  HRI or Home Care Consult Complete  Social Work Consult for Recovery Care Planning/Counseling Complete  Palliative Care Screening Complete  Medication Review Oceanographer) Complete

## 2023-09-13 NOTE — Progress Notes (Signed)
Physical Therapy Treatment Patient Details Name: Steven Elliott MRN: [id number] DOB: November 01, 1929 Today's Date: 09/13/2023   History of Present Illness 89 y.o. male with medical history significant for dementia, chronic kidney disease, COPD, hyponatremia prostate cancer with metastasis to the bone admitted with fall resulting in T11-12 fx. TLSO when OOB.    PT Comments  Pt confused this am attempting to get OOB, able to be redirected. Therapist returned in pm. Pt up in chair daughter at bedside. Pt placed on RA, SpO2 88% at rest after several minutes dropping to 86% after standing at RW for ~30 seconds while adjusting TLSO. Pt assisted back to sitting, placed on 1L O2 returning to 95% at rest with cues for PLB. Pt completed 29ft of gait training in room with CGA, significant wheezing on 1L O2 with SpO2 down to 90% returning to 95% after rest break. Daughter concerned about pt d/c'ing to SNF due to upcoming holiday staffing and pt's high fall risk due to baseline dementia. She is considering d/c to home with HHPT/OT and aid if able to manage, CM notified   If plan is discharge home, recommend the following: A little help with walking and/or transfers;A little help with bathing/dressing/bathroom;Assistance with cooking/housework;Assist for transportation;Help with stairs or ramp for entrance   Can travel by private vehicle     Yes  Equipment Recommendations  Other (comment) (Pt has a RW and hospital bed at home)    Recommendations for Other Services       Precautions / Restrictions Precautions Precautions: Fall Precaution Comments: s/p fall at home Required Braces or Orthoses: Spinal Brace Spinal Brace: Thoracolumbosacral orthotic;Applied in sitting position Restrictions Weight Bearing Restrictions Per Provider Order: No     Mobility  Bed Mobility Overal bed mobility: Needs Assistance Bed Mobility: Rolling, Sidelying to Sit Rolling: Min assist Sidelying to sit: Min assist        General bed mobility comments: Daughter able to assist, pt has a hospital bed at home    Transfers Overall transfer level: Needs assistance Equipment used: Rolling walker (2 wheels) Transfers: Sit to/from Stand Sit to Stand: Min assist, Contact guard assist           General transfer comment: MinA from low recliner with TLSO on    Ambulation/Gait Ambulation/Gait assistance: Contact guard assist Gait Distance (Feet): 30 Feet Assistive device: Rolling walker (2 wheels) Gait Pattern/deviations: Step-through pattern, Knee flexed in stance - right, Knee flexed in stance - left, Trunk flexed Gait velocity: dec     General Gait Details: No LOB with RW.   Stairs             Wheelchair Mobility     Tilt Bed    Modified Rankin (Stroke Patients Only)       Balance Overall balance assessment: Needs assistance Sitting-balance support: No upper extremity supported, Feet supported Sitting balance-Leahy Scale: Good Sitting balance - Comments: supervision sitting EOB   Standing balance support: Bilateral upper extremity supported, During functional activity, Reliant on assistive device for balance Standing balance-Leahy Scale: Fair Standing balance comment: No LOB during short distance gait with RW                            Cognition Arousal: Alert Behavior During Therapy: WFL for tasks assessed/performed Overall Cognitive Status: History of cognitive impairments - at baseline  General Comments: oriented to person only at baseline-daughter reports they keep a clock that has the time, date and month on it at home        Exercises General Exercises - Lower Extremity Ankle Circles/Pumps: AROM, Both, 10 reps, Seated Long Arc Quad: AROM, Both, 10 reps, Seated Hip Flexion/Marching: AROM, Both, 10 reps, Seated Other Exercises Other Exercises: Pt and daughter educated on PLB technique, decreasing falls at  home, and role of PT    General Comments General comments (skin integrity, edema, etc.): Pt assessed on RA and 1L O2 during session.      Pertinent Vitals/Pain Pain Assessment Pain Assessment: Faces Faces Pain Scale: Hurts little more Pain Location: L lateral flank Pain Descriptors / Indicators: Sore Pain Intervention(s): Monitored during session    Home Living                          Prior Function            PT Goals (current goals can now be found in the care plan section) Acute Rehab PT Goals Patient Stated Goal: be able to physically manage pt at home. Progress towards PT goals: Progressing toward goals    Frequency    7X/week      PT Plan      Co-evaluation              AM-PAC PT "6 Clicks" Mobility   Outcome Measure  Help needed turning from your back to your side while in a flat bed without using bedrails?: A Little Help needed moving from lying on your back to sitting on the side of a flat bed without using bedrails?: A Little Help needed moving to and from a bed to a chair (including a wheelchair)?: A Little Help needed standing up from a chair using your arms (e.g., wheelchair or bedside chair)?: A Little Help needed to walk in hospital room?: A Little Help needed climbing 3-5 steps with a railing? : A Lot 6 Click Score: 17    End of Session Equipment Utilized During Treatment: Gait belt;Oxygen Activity Tolerance: Patient tolerated treatment well Patient left: in chair;with call bell/phone within reach;with chair alarm set;with family/visitor present Nurse Communication: Mobility status;Other (comment) (O2 needs) PT Visit Diagnosis: Difficulty in walking, not elsewhere classified (R26.2);Other abnormalities of gait and mobility (R26.89)     Time: 2952-8413 PT Time Calculation (min) (ACUTE ONLY): 49 min  Charges:    $Gait Training: 8-22 mins $Therapeutic Exercise: 8-22 mins $Therapeutic Activity: 8-22 mins PT General  Charges $$ ACUTE PT VISIT: 1 Visit                    Zadie Cleverly, PTA  Jannet Askew 09/13/2023, 3:09 PM

## 2023-09-13 NOTE — Progress Notes (Signed)
  PROGRESS NOTE    Steven Elliott  [id number]:[id number] DOB: 12-25-1929 DOA: 09/08/2023 PCP: [facility]  115A/[id number]-AA  LOS: 4 days   Brief hospital course:   Assessment & Plan: 89 y.o. male with medical history significant for dementia, chronic kidney disease, COPD, hyponatremia prostate cancer with metastasis to the bone admitted with fall resulting in T11-12 fx.    Neurosurgery team advised TLSO brace, pain control, PT/ OT. He is on and off agitated and restless, confused, trying get out of bed. Pain better. Last 2 days mental status better, wishes to go home.    T11-12 fractures status post fall  -underlying ankylosing spondylitis Neurosurgery advised TLSO brace when out of bed, for 3 months PT and OT follow up, patient wishes to go home with [facility]. Toradol, Tylenol PRN ordered. --no tramadol, per daughter   Abnormal LFTs Total bili, direct/ indirect bili up. RUQ sono unremarkable.    COPD (chronic obstructive pulmonary disease) (HCC) On and off hypoxia noted. Encourage incentive spirometry, out of bed. May need home O2 qualification prior to dc. Duonebs as needed.   Dementia (HCC) At baseline. Gets confused and agitated on and off.  Avoid opiates, sedatives as he is high risk for confusion, falls Delirium precautions.   Anemia of chronic disease Stable H&H, monitor   Essential (primary) hypertension BP stable. Continue Norvasc (new med) --add Lopressor 50 mg BID (new)   Cerebral aneurysm, nonruptured CT head shows stable aneurysm   Prostate cancer metastatic to bone (HCC) On home med Nubeqa.  RLQ abdominal pain --described as burning, but not worse with palpation.  CT a/p wo on presentation showed extensive small bowel pneumatosis, thought was present on the PET-CT dated 06/22/2023. --repeat a/p imaging per daughter's request    DVT prophylaxis: Lovenox SQ Code Status: Full code  Family Communication: daughter updated at bedside today Level of  care: Telemetry Medical Dispo:   The patient is from: home Anticipated d/c is to: to be determined Anticipated d/c date is: 1-2 days   Subjective and Interval History:  Pt denied dysuria.  Complained of burning sensation in RLQ.  Daughter requested repeat imaging.   Objective: Vitals:   09/13/23 0840 09/13/23 0934 09/13/23 1224 09/13/23 1547  BP: (!) 164/68   (!) 153/71  Pulse: (!) 108   (!) 106  Resp: 16   16  Temp: 98 F (36.7 C)   (!) 97.5 F (36.4 C)  TempSrc:      SpO2: 98% 97% 98% 96%  Weight:      Height:       No intake or output data in the 24 hours ending 09/13/23 2016 Filed Weights   09/11/23 0500 09/12/23 0450 09/13/23 0445  Weight: 79.4 kg 79.4 kg 75.6 kg    Examination:   Constitutional: NAD, alert, oriented to person and place HEENT: conjunctivae and lids normal, EOMI CV: No cyanosis.   RESP: normal respiratory effort Abdomen: no tenderness to palpation over right abdomen Neuro: II - XII grossly intact.     Data Reviewed: I have personally reviewed labs and imaging studies  Time spent: 50 minutes  Darlin Priestly, MD [facility] If 7PM-7AM, please contact night-coverage 09/13/2023, 8:16 PM

## 2023-09-14 DIAGNOSIS — J9601 Acute respiratory failure with hypoxia: Secondary | ICD-10-CM | POA: Diagnosis not present

## 2023-09-14 LAB — BLOOD GAS, ARTERIAL
Acid-Base Excess: 2.1 mmol/L — ABNORMAL HIGH (ref 0.0–2.0)
Bicarbonate: 25.5 mmol/L (ref 20.0–28.0)
O2 Saturation: 100 %
Patient temperature: 37
pCO2 arterial: 35 mm[Hg] (ref 32–48)
pH, Arterial: 7.47 — ABNORMAL HIGH (ref 7.35–7.45)
pO2, Arterial: 104 mm[Hg] (ref 83–108)

## 2023-09-14 LAB — GLUCOSE, CAPILLARY
Glucose-Capillary: 111 mg/dL — ABNORMAL HIGH (ref 70–99)
Glucose-Capillary: 100 mg/dL — ABNORMAL HIGH (ref 70–99)

## 2023-09-14 LAB — CBC
HCT: 38.8 % — ABNORMAL LOW (ref 39.0–52.0)
Hemoglobin: 12.9 g/dL — ABNORMAL LOW (ref 13.0–17.0)
MCH: 31.5 pg (ref 26.0–34.0)
MCHC: 33.2 g/dL (ref 30.0–36.0)
MCV: 94.6 fL (ref 80.0–100.0)
Platelets: 285 10*3/uL (ref 150–400)
RBC: 4.1 MIL/uL — ABNORMAL LOW (ref 4.22–5.81)
RDW: 13.8 % (ref 11.5–15.5)
WBC: 11.2 10*3/uL — ABNORMAL HIGH (ref 4.0–10.5)
nRBC: 0 % (ref 0.0–0.2)

## 2023-09-14 LAB — MRSA NEXT GEN BY PCR, NASAL: MRSA by PCR Next Gen: NOT DETECTED

## 2023-09-14 MED ORDER — HALOPERIDOL LACTATE 5 MG/ML IJ SOLN
5.0000 mg | Freq: Four times a day (QID) | INTRAMUSCULAR | Status: AC | PRN
  Administered 2023-09-14 – 2023-09-15 (×2): 5 mg via INTRAVENOUS
  Filled 2023-09-14 (×2): qty 1

## 2023-09-14 MED ORDER — CHLORHEXIDINE GLUCONATE CLOTH 2 % EX PADS
6.0000 | MEDICATED_PAD | Freq: Every day | CUTANEOUS | Status: DC
  Administered 2023-09-14 – 2023-09-21 (×7): 6 via TOPICAL

## 2023-09-14 MED ORDER — PANTOPRAZOLE SODIUM 40 MG IV SOLR
40.0000 mg | Freq: Two times a day (BID) | INTRAVENOUS | Status: DC
  Administered 2023-09-14 – 2023-09-18 (×7): 40 mg via INTRAVENOUS
  Filled 2023-09-14 (×7): qty 10

## 2023-09-14 MED ORDER — METOPROLOL TARTRATE 5 MG/5ML IV SOLN
5.0000 mg | Freq: Once | INTRAVENOUS | Status: AC
  Administered 2023-09-14: 5 mg via INTRAVENOUS
  Filled 2023-09-14: qty 5

## 2023-09-14 MED ORDER — OLANZAPINE 10 MG IM SOLR
5.0000 mg | Freq: Once | INTRAMUSCULAR | Status: AC
  Administered 2023-09-14: 5 mg via INTRAMUSCULAR
  Filled 2023-09-14: qty 10

## 2023-09-14 MED ORDER — HALOPERIDOL LACTATE 5 MG/ML IJ SOLN
2.0000 mg | Freq: Four times a day (QID) | INTRAMUSCULAR | Status: DC | PRN
  Administered 2023-09-14: 2 mg via INTRAVENOUS
  Filled 2023-09-14: qty 1

## 2023-09-14 NOTE — Progress Notes (Signed)
Patient noted to have increased work of breathing, with use of accessory muscles. Crackles noted throughout. Pt unable to complete full sentences without needing to stop to catch breath. Night shift reported rapid was called overnight for breathing difficulty. Dr. Fran Lowes notified and will see patient at bedside.

## 2023-09-14 NOTE — Progress Notes (Signed)
PROGRESS NOTE    Steven Elliott  [id number]:[id number] DOB: [birth year] DOA: 09/08/2023 PCP: [facility]  [id number]/[id number]-[id number]  LOS: 5 days   Brief hospital course:   Assessment & Plan: 89 y.o. male with medical history significant for dementia, chronic kidney disease, COPD, hyponatremia prostate cancer with metastasis to the bone admitted with fall resulting in T11-12 fx.    Neurosurgery team advised TLSO brace, pain control, PT/ OT. He is on and off agitated and restless, confused, trying get out of bed. Pain better. Last 2 days mental status better, wishes to go home.    Acute hypoxemic respiratory distress --significant mucus buildup in the upper airway, due to aspiration of fluid intake, own secretion, or acid reflux.  RT attempted nasal suctioning which removed some mucus, but pt couldn't tolerate more suctioning. --cont heated hf.  Avoid BiPAP to prevent pushing mucus further down into the lungs. --NPO for now  GERD --start IV PPI BID  Hospital delirium Baseline dementia --pt trying to pull off heated hf and lines.  RN reported IV haldol didn't work well. --trial Zyprexa.  Avoid benzo unless everything else fails.  T11-12 fractures status post fall  -underlying ankylosing spondylitis Neurosurgery advised TLSO brace when out of bed, for 3 months PT and OT follow up, patient wishes to go home with [facility]. Toradol, Tylenol PRN ordered. --no tramadol, no morphine, per daughter   Abnormal LFTs Total bili, direct/ indirect bili up. RUQ sono unremarkable.    COPD (chronic obstructive pulmonary disease) (HCC) On and off hypoxia noted. Encourage incentive spirometry, out of bed. May need home O2 qualification prior to dc. Duonebs as needed.   Dementia (HCC) At baseline. Gets confused and agitated on and off.  Avoid opiates, sedatives as he is high risk for confusion, falls Delirium precautions.   Anemia of chronic disease Stable H&H, monitor   Essential (primary)  hypertension BP stable. --amlodipine and Lopressor after resuming oral intake   Cerebral aneurysm, nonruptured CT head shows stable aneurysm   Prostate cancer metastatic to bone (HCC) On home med Nubeqa.  RLQ abdominal pain --described as burning, but not worse with palpation.  CT a/p wo on presentation showed extensive small bowel pneumatosis, thought was present on the PET-CT dated 06/22/2023. --repeat a/p imaging per daughter's request    DVT prophylaxis: Lovenox SQ Code Status: Full code  Family Communication: daughters updated at bedside today Level of care: Stepdown Dispo:   The patient is from: home Anticipated d/c is to: to be determined Anticipated d/c date is: to be determined   Subjective and Interval History:  Rapid called overnight for respiratory distress.  This morning, pt was found to have secretion in upper airway with audible gurgling breath sounds, with O2 desat.  Pt was placed on heated hf and transferred to stepdown.    ICU RN reported confusion, and trying to pull out lines.     Objective: Vitals:   09/14/23 1500 09/14/23 1600 09/14/23 1703 09/14/23 1837  BP: (!) 144/87 136/78 (!) 135/54 (!) 142/72  Pulse: (!) 107 (!) 107 (!) 109 86  Resp: (!) 26 (!) 28 (!) 24 (!) 27  Temp:  97.8 F (36.6 C)    TempSrc:  Axillary    SpO2: 96% 94% 93% 93%  Weight:      Height:        Intake/Output Summary (Last 24 hours) at 09/14/2023 1945 Last data filed at 09/14/2023 1730 Gross per 24 hour  Intake --  Output 250 ml  Net -250 ml   Filed Weights   09/12/23 0450 09/13/23 0445 09/14/23 0915  Weight: 79.4 kg 75.6 kg 73.8 kg    Examination:   Constitutional: NAD, alert HEENT: conjunctivae and lids normal, EOMI CV: No cyanosis.   RESP: significant mucus in upper airways, increased RR SKIN: warm, dry Neuro: II - XII grossly intact.     Data Reviewed: I have personally reviewed labs and imaging studies  Time spent: 50 minutes, plus critical care time  due to significant respiratory failure requiring transfer to stepdown.   Darlin Priestly, MD [facility] If 7PM-7AM, please contact night-coverage 09/14/2023, 7:45 PM

## 2023-09-14 NOTE — Progress Notes (Signed)
Patient transferred to ICU via bed with RN x2 and RT. Daughter notified of tx.

## 2023-09-14 NOTE — Plan of Care (Signed)

## 2023-09-14 NOTE — Plan of Care (Signed)
  Problem: Clinical Measurements: Goal: Respiratory complications will improve Outcome: Progressing Goal: Cardiovascular complication will be avoided Outcome: Progressing   Problem: Education: Goal: Knowledge of General Education information will improve Description: Including pain rating scale, medication(s)/side effects and non-pharmacologic comfort measures Outcome: Not Progressing   Problem: Health Behavior/Discharge Planning: Goal: Ability to manage health-related needs will improve Outcome: Not Progressing   Problem: Activity: Goal: Risk for activity intolerance will decrease Outcome: Not Progressing   Problem: Nutrition: Goal: Adequate nutrition will be maintained Outcome: Not Progressing   Problem: Coping: Goal: Level of anxiety will decrease Outcome: Not Progressing

## 2023-09-14 NOTE — Progress Notes (Signed)
1635- RN to give 5 mg of haldol due to agitation per MD Fran Lowes.

## 2023-09-14 NOTE — Progress Notes (Signed)
PT Cancellation Note  Patient Details Name: Steven Elliott MRN: [id number] DOB: September 25, 1929   Cancelled Treatment:     Pt transferred to ICU overnight after RR called for trouble breathing. PT treatment held. Will await new orders to resume PT when medically stable to participate.   Jannet Askew 09/14/2023, 10:29 AM

## 2023-09-14 NOTE — Progress Notes (Signed)
SLP Cancellation Note  Patient Details Name: Steven Elliott MRN: [id number] DOB: 09/16/1930   Cancelled treatment:       Reason Eval/Treat Not Completed: Patient not medically ready;Medical issues which prohibited therapy (chart reviewed; consulted NSG then met w/ pt/Dtrs in room.)  Per NSG/chart notes, patient was transferred to CCU due to respiratory distress/change in medical status this morning.  Per CT Angio/Abd and CXR yesterday: Lower chest- Emphysematous changes and fibrosis in the lungs. No focal consolidation. Small pleural effusions, improved on the left since prior study. Dependent atelectasis in volume loss noted within the lung bases. Moderate gas in the peripheral mesentery as before but no additional evidence of acute bowel ischemia or other acute findings. Worsening thoracic T11 vertebral compression fracture. Patent bifurcated infrarenal aortic stent graft with native sac diameter 4.6 cm, previously 4.5 cm. No definite endoleak.  Pt is currently on HFNC O2 support 35L; FiO2 45%. Noted elevated HR in the low 100s(101-106). Pt's Respiratory status is much declined for breath support for speech -- only able to speak ~2 words consecutively.  Daughters present and endorsed (along w/ pt report) s/s of REFLUX at Baseline. They explained that pt had had "acid reflux" in recent days; pt stated "my stomach went pow"; "the acid burned my me"; "I have a lot of phlegm". Pt is not on a PPI currently per chart orders.   Discussed w/ Pt/Dtrs that pt is recommended to be NPO currently in setting of Rapid Response this morning(and last night also) and transition to CCU level care d/t declined Pulmonary status. Explained that his risk for aspiration/aspiration pneumonia w/ any oral intake is increased at this time.  Discussed w/ MD via secure chat if pt could have aspirated REFLUX material, and a possible PPI (IV).   ST services will f/u w/ pt's status and BSE when medical/respiratory status'  are appropriate/safe for oral intake. Gave Education on oral care, and swabs, to Daughter. Pt/Dtr agreed.       Jerilynn Som, MS, CCC-SLP Speech Language Pathologist Rehab Services; [facility] [phone] (ascom) Bellamy Rubey 09/14/2023, 1:25 PM

## 2023-09-14 NOTE — Significant Event (Signed)
Rapid Response Event Note   Reason for Call :  SOB  Initial Focused Assessment:  Patient sitting up in bed.  Vitals BP 142/70 HR 88 O2 96% on 2L RR 25. Crackles heard bilaterally in upper lobes of lungs.  Patient able to answer question and carry on conversations with no distress. Np Jawo at bedside.     Interventions:  -Duodeb - Chest Xray   MD Notified: NP [facility] Call [phone] Arrival [phone] End [id number]  Follow up with primary nurse patients breathing has improved. Patient returned from CT with no problems.  Judyann Munson, RN

## 2023-09-14 NOTE — Progress Notes (Signed)
OT Cancellation Note  Patient Details Name: Steven Elliott MRN: [id number] DOB: [birth year]   Cancelled Treatment:    Reason Eval/Treat Not Completed: Medical issues which prohibited therapy. Chart reviewed, pt recently transferred to ICU after rapid response with trouble breathing. OT will hold until medically appropriate, will need new consult.   Deadra Diggins L. Daphnie Venturini, OTR/L  09/14/23, 9:30 AM

## 2023-09-14 NOTE — Progress Notes (Signed)
PT Cancellation Note  Patient Details Name: Steven Elliott MRN: [id number] DOB: 1929-11-18   Cancelled Treatment:    Reason Eval/Treat Not Completed: Medical issues which prohibited therapy (Per discussion with care team, patient transferred to CCU due to respiratory distress/change in medical status.  Will require new orders to resume care post-transfer.  Will complete initial order; please reconsult as medically appropriate.)   Ketra Duchesne H. Manson Passey, PT, DPT, NCS 09/14/23, 10:38 AM [phone]

## 2023-09-14 NOTE — Progress Notes (Signed)
Bed assigned in ICU. Attempted to call report. Left name and number for return call.

## 2023-09-15 DIAGNOSIS — J9601 Acute respiratory failure with hypoxia: Secondary | ICD-10-CM | POA: Diagnosis not present

## 2023-09-15 LAB — CBC
HCT: 36.7 % — ABNORMAL LOW (ref 39.0–52.0)
Hemoglobin: 11.8 g/dL — ABNORMAL LOW (ref 13.0–17.0)
MCH: 31.6 pg (ref 26.0–34.0)
MCHC: 32.2 g/dL (ref 30.0–36.0)
MCV: 98.4 fL (ref 80.0–100.0)
Platelets: 272 10*3/uL (ref 150–400)
RBC: 3.73 MIL/uL — ABNORMAL LOW (ref 4.22–5.81)
RDW: 14.1 % (ref 11.5–15.5)
WBC: 12.6 10*3/uL — ABNORMAL HIGH (ref 4.0–10.5)
nRBC: 0 % (ref 0.0–0.2)

## 2023-09-15 LAB — BASIC METABOLIC PANEL
Anion gap: 12 (ref 5–15)
BUN: 28 mg/dL — ABNORMAL HIGH (ref 8–23)
CO2: 25 mmol/L (ref 22–32)
Calcium: 8.1 mg/dL — ABNORMAL LOW (ref 8.9–10.3)
Chloride: 105 mmol/L (ref 98–111)
Creatinine, Ser: 0.92 mg/dL (ref 0.61–1.24)
GFR, Estimated: >60 mL/min (ref >60)
Glucose, Bld: 114 mg/dL — ABNORMAL HIGH (ref 70–99)
Potassium: 3.6 mmol/L (ref 3.5–5.1)
Sodium: 142 mmol/L (ref 135–145)

## 2023-09-15 LAB — GLUCOSE, CAPILLARY
Glucose-Capillary: 89 mg/dL (ref 70–99)
Glucose-Capillary: 124 mg/dL — ABNORMAL HIGH (ref 70–99)
Glucose-Capillary: 97 mg/dL (ref 70–99)

## 2023-09-15 LAB — MAGNESIUM: Magnesium: 2.3 mg/dL (ref 1.7–2.4)

## 2023-09-15 MED ORDER — HYDRALAZINE HCL 20 MG/ML IJ SOLN
5.0000 mg | Freq: Once | INTRAMUSCULAR | Status: AC
  Administered 2023-09-15: 5 mg via INTRAVENOUS
  Filled 2023-09-15: qty 1

## 2023-09-15 MED ORDER — DEXTROSE 5 % IV SOLN: INTRAVENOUS | Status: DC

## 2023-09-15 MED ORDER — OLANZAPINE 10 MG IM SOLR
2.5000 mg | Freq: Once | INTRAMUSCULAR | Status: AC | PRN
  Administered 2023-09-15: 2.5 mg via INTRAMUSCULAR
  Filled 2023-09-15: qty 10

## 2023-09-15 NOTE — Plan of Care (Signed)

## 2023-09-15 NOTE — Progress Notes (Signed)
PROGRESS NOTE    Steven Elliott  [id number] DOB: 1930-01-02 DOA: 09/08/2023 PCP: [facility]  [id number]/IC05A-[id number]  LOS: 6 days   Brief hospital course:   Assessment & Plan: 89 y.o. male with medical history significant for dementia, chronic kidney disease, COPD, hyponatremia prostate cancer with metastasis to the bone admitted with fall resulting in T11-12 fx.    Neurosurgery team advised TLSO brace, pain control, PT/ OT. He is on and off agitated and restless, confused, trying get out of bed. Pain better. Last 2 days mental status better, wishes to go home.    Acute hypoxemic respiratory distress 2/2 Aspiration --significant mucus buildup in the upper airway, due to aspiration of fluid intake, own secretion, or acid reflux.  RT attempted nasal suctioning which removed some mucus, but pt couldn't tolerate more suctioning. --cont heated hf.  Avoid BiPAP to prevent pushing mucus further down into the lungs. --Nasal suctioning as tolerated --NPO for now.  Start MIVF with D5@50 .  GERD --family believed pt aspirated acid reflux --cont IV PPI  Hospital delirium Baseline dementia --pt trying to pull off heated hf and lines.  RN reported IV haldol didn't work well. --trial Zyprexa.  Avoid benzo unless everything else fails.  T11-12 fractures status post fall  -underlying ankylosing spondylitis Neurosurgery advised TLSO brace when out of bed, for 3 months PT and OT follow up, patient wishes to go home with [facility]. Toradol, Tylenol PRN ordered. --no tramadol, no morphine, per daughter   Abnormal LFTs Total bili, direct/ indirect bili up. RUQ sono unremarkable.    COPD (chronic obstructive pulmonary disease) (HCC) On and off hypoxia noted. Encourage incentive spirometry, out of bed. May need home O2 qualification prior to dc. Duonebs as needed.   Dementia (HCC) At baseline. Gets confused and agitated on and off.  Avoid opiates, sedatives as he is high risk for  confusion, falls Delirium precautions.   Anemia of chronic disease Stable H&H, monitor   Essential (primary) hypertension BP stable. --cont amlodipine and Lopressor after resuming oral intake   Cerebral aneurysm, nonruptured CT head shows stable aneurysm   Prostate cancer metastatic to bone (HCC) On home med Nubeqa.  RLQ abdominal pain --described as burning, but not worse with palpation.  CT a/p wo on presentation showed extensive small bowel pneumatosis, thought was present on the PET-CT dated 06/22/2023. --repeat a/p imaging per daughter's request no additional acute finding    DVT prophylaxis: Lovenox SQ Code Status: Full code  Family Communication: daughter updated at bedside today Level of care: Stepdown Dispo:   The patient is from: home Anticipated d/c is to: to be determined Anticipated d/c date is: to be determined   Subjective and Interval History:  Pt was more somnolent today.     Objective: Vitals:   09/15/23 1300 09/15/23 1400 09/15/23 1500 09/15/23 1600  BP: (!) 164/85 (!) 143/68 (!) 167/86 (!) 160/91  Pulse: 91 86 82 95  Resp: 18 15 17  (!) 21  Temp:    98.5 F (36.9 C)  TempSrc:    Oral  SpO2: 95% 96% 97% 96%  Weight:      Height:        Intake/Output Summary (Last 24 hours) at 09/15/2023 1616 Last data filed at 09/15/2023 1612 Gross per 24 hour  Intake 169.51 ml  Output 700 ml  Net -530.49 ml   Filed Weights   09/13/23 0445 09/14/23 0915 09/15/23 0500  Weight: 75.6 kg 73.8 kg 74 kg    Examination:  Constitutional: NAD, somnolent CV: No cyanosis.   RESP: rattling breath sounds, on heated hf SKIN: warm, dry   Data Reviewed: I have personally reviewed labs and imaging studies  Time spent: 50 minutes   Darlin Priestly, MD [facility] If 7PM-7AM, please contact night-coverage 09/15/2023, 4:16 PM

## 2023-09-16 DIAGNOSIS — W19XXXA Unspecified fall, initial encounter: Secondary | ICD-10-CM | POA: Diagnosis not present

## 2023-09-16 DIAGNOSIS — J9601 Acute respiratory failure with hypoxia: Secondary | ICD-10-CM | POA: Diagnosis not present

## 2023-09-16 LAB — GLUCOSE, CAPILLARY
Glucose-Capillary: 102 mg/dL — ABNORMAL HIGH (ref 70–99)
Glucose-Capillary: 118 mg/dL — ABNORMAL HIGH (ref 70–99)

## 2023-09-16 MED ORDER — SODIUM CHLORIDE 0.9 % IV SOLN
3.0000 g | Freq: Four times a day (QID) | INTRAVENOUS | Status: DC
  Administered 2023-09-16 – 2023-09-20 (×16): 3 g via INTRAVENOUS
  Filled 2023-09-16 (×19): qty 8

## 2023-09-16 MED ORDER — DEXTROSE 5 % IV SOLN: INTRAVENOUS | Status: DC

## 2023-09-16 MED ORDER — QUETIAPINE FUMARATE 25 MG PO TABS
50.0000 mg | ORAL_TABLET | Freq: Every day | ORAL | Status: DC
  Administered 2023-09-16 – 2023-09-25 (×10): 50 mg via ORAL
  Filled 2023-09-16 (×10): qty 2

## 2023-09-16 NOTE — Plan of Care (Signed)

## 2023-09-16 NOTE — Progress Notes (Signed)
Pharmacy Antibiotic Note  Steven Elliott is a 89 y.o. male admitted on 09/08/2023 with  aspiration pneumonia .  Pharmacy has been consulted for Unasyn dosing.  Plan: Unasyn 3 g IV q6h (CrCl > 50 mL/min) Monitor renal function, cultures, clinical status Follow up LOT and de-escalate as able  Height: 5\' 8"  (172.7 cm) Weight: 71.5 kg (157 lb 10.1 oz) IBW/kg (Calculated) : 68.4  Temp (24hrs), Avg:98.5 F (36.9 C), Min:98.4 F (36.9 C), Max:98.7 F (37.1 C)  Recent Labs  Lab 09/10/23 0543 09/13/23 1851 09/14/23 0646 09/15/23 0411  WBC 7.2  --  11.2* 12.6*  CREATININE 0.92 0.78  --  0.92    Estimated Creatinine Clearance: 50.6 mL/min (by C-G formula based on SCr of 0.92 mg/dL).    Allergies  Allergen Reactions   Procaine Anaphylaxis   Ciprofloxacin Other (See Comments) and Nausea And Vomiting    Malaise, Rigor   Hydrocodone     Other reaction(s): Rash, Hives, Heart palpitations ()   Hydrocodone-Acetaminophen Itching    Watery eyes   Lisinopril Other (See Comments)   Omeprazole Other (See Comments)    GI reaction   Penicillins Other (See Comments)   Rabeprazole Other (See Comments)    GI reaction    Antimicrobials this admission: Unasyn 12/26 >>   Microbiology results: 12/24 MRSA PCR: not detected  Thank you for allowing pharmacy to be a part of this patient's care.  Jerrilyn Cairo, PharmD 09/16/2023 5:30 PM

## 2023-09-16 NOTE — Evaluation (Signed)
Clinical/Bedside Swallow Evaluation Patient Details  Name: Steven Elliott MRN: [id number] Date of Birth: [birth year]  Today's Date: 09/16/2023 Time: SLP Start Time (ACUTE ONLY): 1120 SLP Stop Time (ACUTE ONLY): 1215 SLP Time Calculation (min) (ACUTE ONLY): 55 min  Past Medical History:  Past Medical History:  Diagnosis Date   AKI (acute kidney injury) (HCC) 05/21/2022   Allergic rhinitis    Aneurysm of infrarenal abdominal aorta (HCC) 02/2015   5.4 cm by CT 03/20/16.    Anosmia    BPH (benign prostatic hyperplasia)    COPD (chronic obstructive pulmonary disease) (HCC)    Diverticulosis    Emphysema (subcutaneous) (surgical) resulting from a procedure    severe by CT 03/20/16   GERD (gastroesophageal reflux disease)    History of bilateral inguinal hernias 02/2015   Hypertension    Peripheral vascular disease (HCC) 02/2015   heavily calcified but patent bil iliac arteries by CTA 03/20/16.   Past Surgical History:  Past Surgical History:  Procedure Laterality Date   APPENDECTOMY     CHOLECYSTECTOMY     COLONOSCOPY  ~ 2007   at [facility]   SKIN CANCER EXCISION     TOTAL KNEE ARTHROPLASTY Left 2014   HPI:  Pt is a 89 y.o. male with medical history significant for Dementia, chronic kidney disease, COPD, hyponatremia prostate cancer with Metastasis to the bone admitted with fall resulting in T11-12 fx. Neurosurgery recommending TLSO brace and conservative care. CT Abdomen Pelvis revealed 1. 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 In retrospect this was present on the PET-CT dated 06/22/2023. Findings may be related to factors such as benign small bowel pneumatosis, which may be related to factors such as chronic mechanical stress (constipation), subclinical mesenteric ischemia, vascular changes from AAA repair, or radiation effects (if applicable). Advise close clinical monitoring and follow-up imaging (CT mesenteric angiogram) if  there are clinical concerns for  mesenteric ischemia 2. Within the left upper quadrant of the abdomen there are several loops of small bowel which are increased in caliber measuring up to  3.7 cm with normal caliber mid and distal small bowel loops. Findings are favored to represent ileus. 3. Status post stent graft repair of abdominal aortic aneurysm. Aneurysm sac measures 4.5 x 3.6 cm. Unchanged from 06/22/2023. 4. 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 Rigid spine fracture across the T11-T12 level as described on lumbar spine CT from 09/08/23. 5. 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 6. Aortic Atherosclerosis and Emphysema.  OF NOTE: per discussion w/ Family, Pt is currently on HFNC O2 support 35L; FiO2 40%. Respiratory status is much improved but still decline for breath support for speech -- only able to speak ~2-3 words consecutively.   Daughters present and endorsed (along w/ pt report) s/s of REFLUX at Baseline. They explained that pt had had "acid reflux" in recent days; pt stated "my stomach went pow"; "the acid burned my me"; "I have a lot of phlegm". Pt is not on a PPI currently per chart orders.  CT Angio and CXR Imaging on 09/14/2023: Per CT Angio/Abd and CXR yesterday: Lower chest- Emphysematous changes and fibrosis in the lungs. No focal  consolidation. Small pleural effusions, improved on the left since prior study. Dependent atelectasis in volume  loss noted within the lung bases.  Pt has been NPO since 09/14/2023 when he had a Rapid Response status and transferred  to CCU level care.    Assessment / Plan / Recommendation  Clinical Impression   Pt seen for repeat BSE this admit s/p a decline in medical status w/ transfer to CCU for medical care. Pt with mildly increased SOB/WOB w/ any exertion; min declined mentation in setting of Baseline Dementia and illness acuity.  On HFNC O2 support; 35L; 40%.  Afebrile. WBC elevated.  Pt appears to present w/ concern for oropharyngeal phase dysphagia; primarily pharyngeal phase dysphagia in setting of declined Pulmonary status and Acute illness. Pt may have had a REFLUX event w/ potential risk for aspiration of REFLUX material(see above). W/ current decline in medical status, risk for aspiration/aspiration pneumonia is HIGH.  At this assessment, pt appeared to present w/ functional oropharyngeal phase swallowing abilities w/ no immediate, overt clinical s/s of aspiration during po trials of a modified diet. He required MOD+ support for feeding. Pt appears at reduced risk for aspiration/aspiration pneumonia following aspiration precautions w/ a Dysphagia diet.  Pt does have challenging factors that could impact oropharyngeal swallowing to include Pain/discomfort of back(NSG aware), fatigue/weakness, Dementia, REFLUX activity/Esophageal phase Dysmotility, and advanced Age, as well as current need for feeding support. These factors can increase risk for aspiration, dysphagia as well as decreased oral intake overall.   During po trials given, pt exhibited no overt coughing, decline in vocal quality, or change in respiratory presentation during/post trials. O2 sats remained 98-99%. Oral phase appeared grossly [facility] w/ timely bolus management, mastication, and control of bolus propulsion for A-P transfer for swallowing. Oral clearing achieved w/ all trial consistencies -- moistened, soft foods given. No trials of thin liquids were attempted this evaluation. OM Exam appeared [facility] w/ no unilateral weakness noted. Speech Clear.   Recommend a Dysphagia level 3(mech soft) consistency diet w/ well-Cut meats, moistened foods; Nectar liquids -- carefully any monitor straw use, and pt should help to Hold Cup when drinking. Recommend general aspiration precautions, small/single bites/sips. Tray setup and sitting up support. REST BREAKS during oral intake as needed. Pills WHOLE vs  Crushed in Puree for safer, easier swallowing -- pt has done this during this admit(encouraged now and for D/C to the Dtr).  Education given on Pills in Puree; food/liquid consistencies of dysphagia diet; swallowing/dysphagia in setting of Pulmonary and Cognitive decline; aspiration and REFLUX precautions to pt and Dtr present.  ST services will continue to follow pt for toleration of diet and trials to upgrade as warranted w/ pt's current illness/deconditioning. Recommend f/u w/ Palliative Care for overall GOC discussion. NSG/MD updated. Recommend Dietician f/u for support. Precautions posted. SLP Visit Diagnosis: Dysphagia, oropharyngeal phase (R13.12) (potential Esophageal phase Dysmotility; REFLUX activity; Metastatic Cancer; Dementia; advanced age; weakness)    Aspiration Risk  Mild aspiration risk;Moderate aspiration risk;Risk for inadequate nutrition/hydration    Diet Recommendation   Nectar;Dysphagia 3 (mechanical soft) = Dysphagia level 3(mech soft) consistency diet w/ well-Cut meats, moistened foods; Nectar liquids -- carefully any monitor straw use, and pt should help to Hold Cup when drinking. Recommend general aspiration precautions, small/single bites/sips. Tray setup and sitting up support. REST BREAKS during oral intake as needed.   Medication Administration: Whole meds with puree (vs Crushed in puree)    Other  Recommendations Recommended Consults:  (Palliative Care consult for GOC; Dietician) Oral Care Recommendations: Oral care BID;Oral care before and after PO;Staff/trained caregiver to provide oral care (Denture care)    Recommendations for follow up therapy are one component of a multi-disciplinary discharge planning process, led by the attending  physician.  Recommendations may be updated based on patient status, additional functional criteria and insurance authorization.  Follow up Recommendations Follow physician's recommendations for discharge plan and follow up therapies       Assistance Recommended at Discharge  FULL  Functional Status Assessment Patient has had a recent decline in their functional status and/or demonstrates limited ability to make significant improvements in function in a reasonable and predictable amount of time  Frequency and Duration min 2x/week  2 weeks       Prognosis Prognosis for improved oropharyngeal function: Guarded (-Fair) Barriers to Reach Goals: Cognitive deficits;Time post onset;Severity of deficits;Motivation Barriers/Prognosis Comment: potential Esophageal phase Dysmotility; REFLUX activity; Metastatic Cancer; Dementia; advanced age; weakness      Swallow Study   General Date of Onset: 09/08/23 HPI: Pt is a 89 y.o. male with medical history significant for Dementia, chronic kidney disease, COPD, hyponatremia prostate cancer with Metastasis to the bone admitted with fall resulting in T11-12 fx. Neurosurgery recommending TLSO brace and conservative care. CT Abdomen Pelvis revealed 1. 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 In retrospect this was present on the PET-CT dated 06/22/2023. Findings may be related to factors such as benign small bowel pneumatosis, which may be related to factors such as chronic mechanical stress (constipation), subclinical mesenteric ischemia, vascular changes from AAA repair, or radiation effects (if applicable). Advise close clinical monitoring and follow-up imaging (CT mesenteric angiogram) if there are clinical concerns for  mesenteric ischemia 2. Within the left upper quadrant of the abdomen there are several loops of small bowel which are increased in caliber measuring up to  3.7 cm with normal caliber mid and distal small bowel loops. Findings are favored to represent ileus. 3. Status post stent graft repair of abdominal aortic aneurysm. Aneurysm sac measures 4.5 x 3.6 cm. Unchanged from 06/22/2023. 4. Signs of ankylosing spondylitis with diffuse  ankylosis of the thoracic and upper lumbar spine. Rigid spine fracture across the T11-T12 level as described on lumbar spine CT from 09/08/23. 5. 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 6. Aortic Atherosclerosis and Emphysema.  OF NOTE: per discussion w/ Family, Pt is currently on HFNC O2 support 35L; FiO2 40%. Respiratory status is much improved but still decline for breath support for speech -- only able to speak ~2-3 words consecutively.   Daughters present and endorsed (along w/ pt report) s/s of REFLUX at Baseline. They explained that pt had had "acid reflux" in recent days; pt stated "my stomach went pow"; "the acid burned my me"; "I have a lot of phlegm". Pt is not on a PPI currently per chart orders.  CT Angio and CXR Imaging on 09/14/2023: Per CT Angio/Abd and CXR yesterday: Lower chest- Emphysematous changes and fibrosis in the lungs. No focal  consolidation. Small pleural effusions, improved on the left since prior study. Dependent atelectasis in volume  loss noted within the lung bases.  Pt has been NPO since 09/14/2023 when he had a Rapid Response status and transferred to CCU level care. Type of Study: Bedside Swallow Evaluation Previous Swallow Assessment: 09/10/23- this admit. Mech Soft w/ thins. Diet Prior to this Study: NPO Temperature Spikes Noted: No (wbc 12.6) Respiratory Status: Nasal cannula (HFNC 35L 40% FIO2) History of Recent Intubation: No Behavior/Cognition: Alert;Cooperative;Pleasant mood;Distractible;Requires cueing Oral Cavity Assessment: Dry Oral Care Completed by SLP: Yes Oral Cavity - Dentition: Dentures, top;Dentures, bottom Vision: Functional for self-feeding Self-Feeding Abilities: Needs assist;Needs set up;Total  assist Patient Positioning: Upright in bed (needed encouragement for sitting upright) Baseline Vocal Quality: Breathy;Low vocal intensity (~2-3 words breath support) Volitional Cough: Weak (-Fair) Volitional Swallow: Able  to elicit    Oral/Motor/Sensory Function Overall Oral Motor/Sensory Function: Within functional limits   Ice Chips Ice chips: Not tested   Thin Liquid Thin Liquid: Not tested    Nectar Thick Nectar Thick Liquid: Within functional limits Presentation: Spoon;Straw (fed; 3 via tsp; ~3 ozs via straw) Other Comments: rest breaks as needed   Honey Thick Honey Thick Liquid: Not tested   Puree Puree: Within functional limits Presentation: Spoon (fed; 8 trials)   Solid     Solid: Within functional limits (well-moistened) Presentation: Spoon (fed; 6 trials) Other Comments: min slower oral phase intermitently        Steven Som, MS, CCC-SLP Speech Language Pathologist Rehab Services; [facility] [place] [phone] (ascom) Steven Elliott 09/16/2023,3:01 PM

## 2023-09-16 NOTE — Progress Notes (Signed)
PROGRESS NOTE    BREN BORROMEO  WCB:[id number] DOB: 08/08/1930 DOA: 09/08/2023 PCP: [facility]  IC05A/IC05A-AA  LOS: 7 days   Brief hospital course:   Assessment & Plan: 89 y.o. male with medical history significant for dementia, chronic kidney disease, COPD, hyponatremia prostate cancer with metastasis to the bone admitted with fall resulting in T11-12 fx.    Neurosurgery team advised TLSO brace, pain control, PT/ OT. He is on and off agitated and restless, confused, trying get out of bed. Pain better. Last 2 days mental status better, wishes to go home.    Acute hypoxemic respiratory distress 2/2 Aspiration --significant mucus buildup in the upper airway, due to aspiration of fluid intake, own secretion, or acid reflux.  RT attempted nasal suctioning which removed some mucus, but pt couldn't tolerate more suctioning.  Transferred to stepdown for heated hf and risk of intubation. --cont heated hf, transition to regular HFNC if tolerated --Avoid BiPAP to prevent pushing mucus further down into the lungs. --Nasal suctioning as tolerated --chest PT --dys 3 with nectar thick --cont MIVF with D5@50  for now  GERD --family believed pt aspirated acid reflux --cont IV PPI  Hospital delirium Baseline dementia --pt trying to pull off heated hf and lines.  RN reported IV haldol didn't work well. --start seroquel 50 mg at bedtime  T11-12 fractures status post fall  -underlying ankylosing spondylitis Neurosurgery advised TLSO brace when out of bed, for 3 months PT and OT follow up, patient wishes to go home with [facility]. Toradol, Tylenol PRN ordered. --no tramadol, no morphine, per daughter   Abnormal LFTs Total bili, direct/ indirect bili up. RUQ sono unremarkable.    COPD (chronic obstructive pulmonary disease) (HCC) On and off hypoxia noted. Encourage incentive spirometry, out of bed. May need home O2 qualification prior to dc. Duonebs as needed.   Dementia  (HCC) At baseline. Gets confused and agitated on and off.  Avoid opiates, sedatives as he is high risk for confusion, falls Delirium precautions.   Anemia of chronic disease Stable H&H, monitor   Essential (primary) hypertension BP stable. --cont amlodipine and Lopressor   Cerebral aneurysm, nonruptured CT head shows stable aneurysm   Prostate cancer metastatic to bone (HCC) On home med Nubeqa.  RLQ abdominal pain --described as burning, but not worse with palpation.  CT a/p wo on presentation showed extensive small bowel pneumatosis, thought was present on the PET-CT dated 06/22/2023. --repeat a/p imaging per daughter's request with no additional acute finding    DVT prophylaxis: Lovenox SQ Code Status: Full code  Family Communication: daughter updated at bedside today Level of care: Stepdown Dispo:   The patient is from: home Anticipated d/c is to: to be determined Anticipated d/c date is: to be determined   Subjective and Interval History:  Pt more alert today, and was cleared for some oral intake.   Objective: Vitals:   09/16/23 0900 09/16/23 1014 09/16/23 1100 09/16/23 1200  BP: (!) 143/77 (!) 167/70 (!) 155/92 (!) 142/60  Pulse: (!) 44 93 65   Resp: (!) 22 (!) 27 (!) 21 (!) 27  Temp:    98.7 F (37.1 C)  TempSrc:    Oral  SpO2: 95% 95% 97%   Weight:      Height:        Intake/Output Summary (Last 24 hours) at 09/16/2023 1808 Last data filed at 09/16/2023 1240 Gross per 24 hour  Intake 1021.42 ml  Output 1050 ml  Net -28.58 ml  Filed Weights   09/14/23 0915 09/15/23 0500 09/16/23 0500  Weight: 73.8 kg 74 kg 71.5 kg    Examination:   Constitutional: NAD, alert HEENT: conjunctivae and lids normal, EOMI CV: No cyanosis.   RESP: rattling breath sounds, on heated hf Extremities: No effusions, edema in BLE SKIN: warm, dry Neuro: II - XII grossly intact.   Psych: subdued mood and affect.     Data Reviewed: I have personally reviewed labs and  imaging studies  Time spent: 35 minutes   Darlin Priestly, MD [facility] If 7PM-7AM, please contact night-coverage 09/16/2023, 6:08 PM

## 2023-09-17 DIAGNOSIS — J9601 Acute respiratory failure with hypoxia: Secondary | ICD-10-CM | POA: Diagnosis not present

## 2023-09-17 LAB — BASIC METABOLIC PANEL
Anion gap: 11 (ref 5–15)
BUN: 21 mg/dL (ref 8–23)
CO2: 27 mmol/L (ref 22–32)
Calcium: 7.9 mg/dL — ABNORMAL LOW (ref 8.9–10.3)
Chloride: 99 mmol/L (ref 98–111)
Creatinine, Ser: 0.85 mg/dL (ref 0.61–1.24)
GFR, Estimated: >60 mL/min (ref >60)
Glucose, Bld: 135 mg/dL — ABNORMAL HIGH (ref 70–99)
Potassium: 3.4 mmol/L — ABNORMAL LOW (ref 3.5–5.1)
Sodium: 137 mmol/L (ref 135–145)

## 2023-09-17 LAB — GLUCOSE, CAPILLARY
Glucose-Capillary: 123 mg/dL — ABNORMAL HIGH (ref 70–99)
Glucose-Capillary: 147 mg/dL — ABNORMAL HIGH (ref 70–99)
Glucose-Capillary: 116 mg/dL — ABNORMAL HIGH (ref 70–99)

## 2023-09-17 LAB — CBC
HCT: 38.7 % — ABNORMAL LOW (ref 39.0–52.0)
Hemoglobin: 12.6 g/dL — ABNORMAL LOW (ref 13.0–17.0)
MCH: 31.9 pg (ref 26.0–34.0)
MCHC: 32.6 g/dL (ref 30.0–36.0)
MCV: 98 fL (ref 80.0–100.0)
Platelets: 234 10*3/uL (ref 150–400)
RBC: 3.95 MIL/uL — ABNORMAL LOW (ref 4.22–5.81)
RDW: 13.8 % (ref 11.5–15.5)
WBC: 11.1 10*3/uL — ABNORMAL HIGH (ref 4.0–10.5)
nRBC: 0 % (ref 0.0–0.2)

## 2023-09-17 LAB — MAGNESIUM: Magnesium: 2 mg/dL (ref 1.7–2.4)

## 2023-09-17 MED ORDER — SODIUM CHLORIDE 3 % IN NEBU
4.0000 mL | INHALATION_SOLUTION | Freq: Two times a day (BID) | RESPIRATORY_TRACT | Status: AC
  Administered 2023-09-17 – 2023-09-19 (×5): 4 mL via RESPIRATORY_TRACT
  Filled 2023-09-17 (×7): qty 4

## 2023-09-17 MED ORDER — IPRATROPIUM-ALBUTEROL 0.5-2.5 (3) MG/3ML IN SOLN
3.0000 mL | Freq: Two times a day (BID) | RESPIRATORY_TRACT | Status: DC
  Administered 2023-09-17 – 2023-09-22 (×11): 3 mL via RESPIRATORY_TRACT
  Filled 2023-09-17 (×10): qty 3

## 2023-09-17 NOTE — Plan of Care (Signed)
  Problem: Education: Goal: Knowledge of General Education information will improve Description: Including pain rating scale, medication(s)/side effects and non-pharmacologic comfort measures Outcome: Progressing   Problem: Elimination: Goal: Will not experience complications related to bowel motility Outcome: Progressing Goal: Will not experience complications related to urinary retention Outcome: Progressing   Problem: Pain Management: Goal: General experience of comfort will improve Outcome: Progressing   Problem: Safety: Goal: Ability to remain free from injury will improve Outcome: Progressing

## 2023-09-17 NOTE — Progress Notes (Signed)
Patient transported to 140A. 4 L [place]. VSS. AxO to self.

## 2023-09-17 NOTE — Progress Notes (Signed)
PT Cancellation Note  Patient Details Name: Steven Elliott MRN: [id number] DOB: Jun 29, 1930   Cancelled Treatment:    Reason Eval/Treat Not Completed: Patient declined, no reason specified (Chart reviewed and re-evaluation attempted.  Patient declined participation with session, citing "burning in my stomach" and "pain in my back". Daughter in agreement with patient refusal. Will continue to follow as appropriate.)  RN informed/aware of patient reports of pain. Meds recently administered.  [name] H. Manson Passey, PT, DPT, NCS 09/17/23, 1:48 PM [phone]

## 2023-09-17 NOTE — Progress Notes (Signed)
PROGRESS NOTE    Steven Elliott  WUJ:[id number] DOB: June 27, 1930 DOA: 09/08/2023 PCP: [facility]  140A/140A-AA  LOS: 8 days   Brief hospital course:   Assessment & Plan: 89 y.o. male with medical history significant for dementia, chronic kidney disease, COPD, hyponatremia prostate cancer with metastasis to the bone admitted with fall resulting in T11-12 fx.    Neurosurgery team advised TLSO brace, pain control, PT/ OT. He is on and off agitated and restless, confused, trying get out of bed. Pain better. Last 2 days mental status better, wishes to go home.    Acute hypoxemic respiratory distress 2/2 Aspiration --significant mucus buildup in the upper airway, due to aspiration of fluid intake, own secretion, or acid reflux.  RT attempted nasal suctioning which removed some mucus, but pt couldn't tolerate more suctioning.  Transferred to stepdown for heated hf and risk of intubation. --cont heated hf, transition to regular HFNC if tolerated --Avoid BiPAP to prevent pushing mucus further down into the lungs. --Nasal suctioning as tolerated --chest PT, 3% saline neb --dys 3 with nectar thick --cont Unasyn   GERD --family believed pt aspirated acid reflux --cont IV PPI  Hospital delirium Baseline dementia --pt trying to pull off heated hf and lines.  RN reported IV haldol didn't work well. --cont seroquel 50 mg at bedtime  T11-12 fractures status post fall  -underlying ankylosing spondylitis Neurosurgery advised TLSO brace when out of bed, for 3 months PT and OT follow up, patient wishes to go home with [facility]. Toradol, Tylenol PRN ordered. --no tramadol, no morphine, per daughter   Abnormal LFTs Total bili, direct/ indirect bili up. RUQ sono unremarkable.    COPD (chronic obstructive pulmonary disease) (HCC) On and off hypoxia noted. Encourage incentive spirometry, out of bed. May need home O2 qualification prior to dc. Duonebs as needed.   Dementia  (HCC) At baseline. Gets confused and agitated on and off.  Avoid opiates, sedatives as he is high risk for confusion, falls Delirium precautions.   Anemia of chronic disease Stable H&H, monitor   Essential (primary) hypertension BP stable. --cont amlodipine and Lopressor   Cerebral aneurysm, nonruptured CT head shows stable aneurysm   Prostate cancer metastatic to bone (HCC) --cont home Nubeqa  RLQ abdominal pain --described as burning, but not worse with palpation.  CT a/p wo on presentation showed extensive small bowel pneumatosis, thought was present on the PET-CT dated 06/22/2023. --repeat a/p imaging per daughter's request with no additional acute finding    DVT prophylaxis: Lovenox SQ Code Status: Full code  Family Communication:  Level of care: Med-Surg Dispo:   The patient is from: home Anticipated d/c is to: to be determined Anticipated d/c date is: to be determined   Subjective and Interval History:  Pt was weaned off heated hf today, and transferred out of stepdown.   Objective: Vitals:   09/17/23 1124 09/17/23 1250 09/17/23 1255 09/17/23 1600  BP:  (!) 177/85  116/68  Pulse:  91  89  Resp:  18  17  Temp: 98.3 F (36.8 C)   98 F (36.7 C)  TempSrc: Axillary     SpO2:  99% 100% 100%  Weight:      Height:        Intake/Output Summary (Last 24 hours) at 09/17/2023 1942 Last data filed at 09/17/2023 1200 Gross per 24 hour  Intake 1194.83 ml  Output 550 ml  Net 644.83 ml   Filed Weights   09/15/23 0500 09/16/23 0500 09/17/23  0500  Weight: 74 kg 71.5 kg 71.1 kg    Examination:   Constitutional: NAD CV: No cyanosis.   RESP: normal respiratory effort SKIN: warm, dry   Data Reviewed: I have personally reviewed labs and imaging studies  Time spent: 35 minutes   Darlin Priestly, MD [facility] If 7PM-7AM, please contact night-coverage 09/17/2023, 7:42 PM

## 2023-09-17 NOTE — Progress Notes (Signed)
Attempted to call report to nurse taking patient in room 140. 1A secretary said they are still trying to decide which nurse will get the assignment and to call back soon.

## 2023-09-17 NOTE — Progress Notes (Signed)
Speech Language Pathology Treatment: Dysphagia  Patient Details Name: Steven Elliott MRN: [id number] DOB: Feb 04, 1930 Today's Date: 09/17/2023 Time: 0920-0928 SLP Time Calculation (min) (ACUTE ONLY): 8 min  Assessment / Plan / Recommendation Clinical Impression  Pt seen for diet tolerance which was limited by pt's participation. Pt alert, confused at times. Increased WOB/SOB with repositioning efforts. On 35L/min O2 via HFNC. Pt only accepted x2 cup sips of nectar-thick liquids despite cueing/education. Given limited nature of tx session, will plan to f/u in 1-2 days for diet tolerance. Recommend continuation of current diet with safe swallowing strategies/aspiration precautions as outlined below in the interim. Should pt exhibit any increased difficulty chewing/swallowing or changes to lung status concerning for prandial aspiration, please make pt NPO and contact SLP.    HPI HPI: Pt is a 89 y.o. male with medical history significant for Dementia, chronic kidney disease, COPD, hyponatremia prostate cancer with Metastasis to the bone admitted with fall resulting in T11-12 fx. Neurosurgery recommending TLSO brace and conservative care. CT Abdomen Pelvis revealed 1. 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 In retrospect this was present on the PET-CT dated 06/22/2023. Findings may be related to factors such as benign small bowel pneumatosis, which may be related to factors such as chronic mechanical stress (constipation), subclinical mesenteric ischemia, vascular changes from AAA repair, or radiation effects (if applicable). Advise close clinical monitoring and follow-up imaging (CT mesenteric angiogram) if there are clinical concerns for  mesenteric ischemia 2. Within the left upper quadrant of the abdomen there are several loops of small bowel which are increased in caliber measuring up to  3.7 cm with normal caliber mid and distal small bowel loops.  Findings are favored to represent ileus. 3. Status post stent graft repair of abdominal aortic aneurysm. Aneurysm sac measures 4.5 x 3.6 cm. Unchanged from 06/22/2023. 4. 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 Rigid spine fracture across the T11-T12 level as described on lumbar spine CT from 09/08/23. 5. 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 6. Aortic Atherosclerosis and Emphysema.  OF NOTE: per discussion w/ Family, Pt is currently on HFNC O2 support 35L; FiO2 40%. Respiratory status is much improved but still decline for breath support for speech -- only able to speak ~2-3 words consecutively.   Daughters present and endorsed (along w/ pt report) s/s of REFLUX at Baseline. They explained that pt had had "acid reflux" in recent days; pt stated "my stomach went pow"; "the acid burned my me"; "I have a lot of phlegm". Pt is not on a PPI currently per chart orders.  CT Angio and CXR Imaging on 09/14/2023: Per CT Angio/Abd and CXR yesterday: Lower chest- Emphysematous changes and fibrosis in the lungs. No focal  consolidation. Small pleural effusions, improved on the left since prior study. Dependent atelectasis in volume  loss noted within the lung bases.  Pt has been NPO since 09/14/2023 when he had a Rapid Response status and transferred to CCU level care.      SLP Plan  Continue with current plan of care      Recommendations for follow up therapy are one component of a multi-disciplinary discharge planning process, led by the attending physician.  Recommendations may be updated based on patient status, additional functional criteria and insurance authorization.    Recommendations  Diet recommendations: Dysphagia 3 (mechanical soft);Nectar-thick liquid Liquids provided via: Cup;Straw (monitor straw sips) Medication  Administration: Whole meds with puree (vs crushed) Supervision: Staff to assist with self  feeding;Full supervision/cueing for compensatory strategies Compensations: Minimize environmental distractions;Slow rate;Small sips/bites Postural Changes and/or Swallow Maneuvers: Seated upright 90 degrees (upright 60-90 minutes after POs)                  Oral care BID;Oral care before and after PO;Staff/trained caregiver to provide oral care   Frequent or constant Supervision/Assistance Dysphagia, oropharyngeal phase (R13.12)     Continue with current plan of care    Steven Elliott, M.S., CCC-SLP Speech-Language Pathologist [facility] [phone] (ASCOM)  Steven Elliott  09/17/2023, 10:21 AM

## 2023-09-17 NOTE — Plan of Care (Signed)
  Problem: Activity: Goal: Risk for activity intolerance will decrease Outcome: Progressing   Problem: Elimination: Goal: Will not experience complications related to bowel motility Outcome: Progressing   Problem: Education: Goal: Knowledge of General Education information will improve Description: Including pain rating scale, medication(s)/side effects and non-pharmacologic comfort measures Outcome: Not Progressing   Problem: Health Behavior/Discharge Planning: Goal: Ability to manage health-related needs will improve Outcome: Not Progressing   Problem: Pain Management: Goal: General experience of comfort will improve Outcome: Not Progressing

## 2023-09-18 DIAGNOSIS — W19XXXA Unspecified fall, initial encounter: Secondary | ICD-10-CM | POA: Diagnosis not present

## 2023-09-18 DIAGNOSIS — J9601 Acute respiratory failure with hypoxia: Secondary | ICD-10-CM | POA: Diagnosis not present

## 2023-09-18 LAB — GLUCOSE, CAPILLARY: Glucose-Capillary: 148 mg/dL — ABNORMAL HIGH (ref 70–99)

## 2023-09-18 MED ORDER — PANTOPRAZOLE SODIUM 40 MG PO TBEC
40.0000 mg | DELAYED_RELEASE_TABLET | Freq: Two times a day (BID) | ORAL | Status: DC
  Administered 2023-09-18 – 2023-09-26 (×16): 40 mg via ORAL
  Filled 2023-09-18 (×16): qty 1

## 2023-09-18 MED ORDER — HYDROCODONE-ACETAMINOPHEN 5-325 MG PO TABS
1.0000 | ORAL_TABLET | ORAL | Status: DC | PRN
  Administered 2023-09-18 – 2023-09-24 (×13): 1 via ORAL
  Filled 2023-09-18 (×14): qty 1

## 2023-09-18 NOTE — Progress Notes (Signed)
PT Cancellation Note  Patient Details Name: Steven Elliott MRN: [id number] DOB: 03-Jan-1930   Cancelled Treatment:    Reason Eval/Treat Not Completed: Pain limiting ability to participate.  Therapist called pt's nurse prior to attempted session to pre-medicate with pain meds.  Upon PT arrival, pt resting in bed with pt's family present.  Pt declining therapy d/t 9/10 back pain (pt's daughter reports pt was sleeping when nursing attempted to pre-medicate with pain meds so pt did not receive any pain medication).  Nursing arrived and reported plan to bring pt pain meds.  Will re-attempt PT session at a later date/time as able.  Hendricks Limes, PT 09/18/23, 3:56 PM

## 2023-09-18 NOTE — Plan of Care (Signed)

## 2023-09-18 NOTE — Progress Notes (Signed)
PROGRESS NOTE    Steven Elliott  AVW:[id number] DOB: 04-Sep-1930 DOA: 09/08/2023 PCP: [facility]  140A/140A-AA  LOS: 9 days   Brief hospital course:   Assessment & Plan: 89 y.o. male with medical history significant for dementia, chronic kidney disease, COPD, hyponatremia prostate cancer with metastasis to the bone admitted with fall resulting in T11-12 fx.    Neurosurgery team advised TLSO brace, pain control, PT/ OT. He is on and off agitated and restless, confused, trying get out of bed. Pain better. Last 2 days mental status better, wishes to go home.    Acute hypoxemic respiratory distress 2/2 Aspiration --significant mucus buildup in the upper airway, due to aspiration of fluid intake, own secretion, or acid reflux.  RT attempted nasal suctioning which removed some mucus, but pt couldn't tolerate more suctioning.  Transferred to stepdown for heated hf and risk of intubation. --cont heated hf, transition to regular HFNC if tolerated --Avoid BiPAP to prevent pushing mucus further down into the lungs. --Nasal suctioning as tolerated --chest PT, 3% saline neb --dys 3 with nectar thick --cont Unasyn  --IS and flutter valve  GERD --family believed pt aspirated acid reflux -switched from IV to oral PPI BID  Hospital delirium Baseline dementia --pt trying to pull off heated hf and lines.  RN reported IV haldol didn't work well. --cont seroquel 50 mg at bedtime  T11-12 fractures status post fall  -underlying ankylosing spondylitis Neurosurgery advised TLSO brace when out of bed, for 3 months PT and OT follow up, patient wishes to go home with [facility]. Toradol, Tylenol PRN ordered. --no tramadol, no morphine, per daughter --trial Norco PRN   Abnormal LFTs Total bili, direct/ indirect bili up. RUQ sono unremarkable.    COPD (chronic obstructive pulmonary disease) (HCC) On and off hypoxia noted. Encourage incentive spirometry, out of bed. May need home O2  qualification prior to dc. Duonebs as needed.   Dementia (HCC) At baseline. Gets confused and agitated on and off.  Avoid opiates, sedatives as he is high risk for confusion, falls Delirium precautions.   Anemia of chronic disease Stable H&H, monitor   Essential (primary) hypertension BP stable. --cont amlodipine and Lopressor   Cerebral aneurysm, nonruptured CT head shows stable aneurysm   Prostate cancer metastatic to bone (HCC) --cont home Nubeqa  RLQ abdominal pain --described as burning, but not worse with palpation.  CT a/p wo on presentation showed extensive small bowel pneumatosis, thought was present on the PET-CT dated 06/22/2023. --repeat a/p imaging per daughter's request with no additional acute finding    DVT prophylaxis: Lovenox SQ Code Status: Full code  Family Communication: daughter updated at bedside today Level of care: Med-Surg Dispo:   The patient is from: home Anticipated d/c is to: to be determined Anticipated d/c date is: to be determined   Subjective and Interval History:  Pt complained of back pain and burning abdominal pain that come and go.   Objective: Vitals:   09/17/23 2240 09/18/23 0743 09/18/23 0753 09/18/23 1536  BP: 131/68  (!) 143/66 (!) 110/49  Pulse: 88  87 68  Resp: 18  18 16   Temp: 97.8 F (36.6 C)  97.7 F (36.5 C) 97.8 F (36.6 C)  TempSrc:   Axillary   SpO2: 100% 93% 95% 97%  Weight:      Height:        Intake/Output Summary (Last 24 hours) at 09/18/2023 1841 Last data filed at 09/18/2023 0223 Gross per 24 hour  Intake --  Output 170 ml  Net -170 ml   Filed Weights   09/15/23 0500 09/16/23 0500 09/17/23 0500  Weight: 74 kg 71.5 kg 71.1 kg    Examination:   Constitutional: NAD, alert HEENT: conjunctivae and lids normal, EOMI CV: No cyanosis.   RESP: normal respiratory effort, weak cough, on 2L Neuro: II - XII grossly intact.   Psych: subdued mood and affect.     Data Reviewed: I have personally  reviewed labs and imaging studies  Time spent: 35 minutes   Darlin Priestly, MD [facility] If 7PM-7AM, please contact night-coverage 09/18/2023, 6:41 PM

## 2023-09-19 DIAGNOSIS — J9601 Acute respiratory failure with hypoxia: Secondary | ICD-10-CM | POA: Diagnosis not present

## 2023-09-19 DIAGNOSIS — W19XXXA Unspecified fall, initial encounter: Secondary | ICD-10-CM | POA: Diagnosis not present

## 2023-09-19 NOTE — TOC Progression Note (Signed)
Transition of Care [facility]) - Progression Note    Patient Details  Name: Steven Elliott MRN: [id number] Date of Birth: 11-09-1929  Transition of Care [facility]) CM/SW Contact  Liliana Cline, LCSW Phone Number: 09/19/2023, 4:16 PM  Clinical Narrative:    CSW attempted call to daughter [name] to discuss discharge planning. Per MD, patient should be medically ready for SNF in 1-2 days. Per chart review, [name] wanted [facility] or [facility]. CSW sent updated clinicals to [facility] and [facility].        Expected Discharge Plan and Services                                               Social Determinants of Health (SDOH) Interventions SDOH Screenings   Food Insecurity: No Food Insecurity (09/09/2023)  Housing: Low Risk  (09/09/2023)  Transportation Needs: No Transportation Needs (09/09/2023)  Utilities: Not At Risk (09/09/2023)  Tobacco Use: Medium Risk (09/08/2023)    Readmission Risk Interventions    09/09/2023    1:41 PM  Readmission Risk Prevention Plan  Transportation Screening Complete  PCP or Specialist Appt within 3-5 Days Complete  HRI or Home Care Consult Complete  Social Work Consult for Recovery Care Planning/Counseling Complete  Palliative Care Screening Complete  Medication Review [employer]) Complete

## 2023-09-19 NOTE — Progress Notes (Signed)
PROGRESS NOTE    Steven Elliott  UJW:[id number] DOB: [birth year] DOA: 09/08/2023 PCP: [facility]  140A/140A-AA  LOS: 10 days   Brief hospital course:   Assessment & Plan: 89 y.o. male with medical history significant for dementia, chronic kidney disease, COPD, hyponatremia prostate cancer with metastasis to the bone admitted with fall resulting in T11-12 fx.    Acute hypoxemic respiratory distress 2/2 Aspiration --significant mucus buildup in the upper airway, due to aspiration of fluid intake, own secretion, or acid reflux.  RT attempted nasal suctioning which removed some mucus, but pt couldn't tolerate more suctioning.  Transferred to stepdown for heated hf.  Now weaned down to 3L O2. --Nasal suctioning as tolerated --chest PT, 3% saline neb --dys 3 with nectar thick --cont Unasyn for empiric tx, day 4 of 5 --IS and flutter valve  GERD --family believed pt aspirated acid reflux --cont PPI BID  Hospital delirium Baseline dementia --pt trying to pull off heated hf and lines.  RN reported IV haldol didn't work well. --cont seroquel 50 mg at bedtime  T11-12 fractures status post fall  -underlying ankylosing spondylitis Neurosurgery advised TLSO brace when out of bed, for 3 months --no tramadol, no morphine, per daughter --Norco PRN   Abnormal LFTs Total bili, direct/ indirect bili up. RUQ sono unremarkable.    COPD (chronic obstructive pulmonary disease) (HCC) On and off hypoxia noted. Encourage incentive spirometry, out of bed. May need home O2 qualification prior to dc. Duonebs as needed.   Dementia (HCC) At baseline. Gets confused and agitated on and off.  Avoid opiates, sedatives as he is high risk for confusion, falls Delirium precautions.   Anemia of chronic disease Stable H&H, monitor   Essential (primary) hypertension BP stable. --cont amlodipine and Lopressor   Cerebral aneurysm, nonruptured CT head shows stable aneurysm    Prostate cancer metastatic to bone (HCC) --cont home Nubeqa  RLQ abdominal pain --described as burning, but not worse with palpation.  CT a/p wo on presentation showed extensive small bowel pneumatosis, thought was present on the PET-CT dated 06/22/2023. --repeat a/p imaging per daughter's request with no additional acute finding    DVT prophylaxis: Lovenox SQ Code Status: Full code  Family Communication: daughter updated at bedside today Level of care: Med-Surg Dispo:   The patient is from: home Anticipated d/c is to: SNF rehab, however, daughter hasn't decided. Anticipated d/c date is: when disposition determined   Subjective and Interval History:  Daughter again reported pt complained of abdominal burning pain, though pt denied it at the time of rounding.  Pt did complain of back pain.   Objective: Vitals:   09/19/23 0755 09/19/23 1224 09/19/23 1504 09/19/23 1807  BP: (!) 159/66 (!) 162/79 (!) 162/77 (!) 136/58  Pulse: 79 75 68 68  Resp: 20 18 20    Temp: 98.1 F (36.7 C) 98.1 F (36.7 C) 98 F (36.7 C)   TempSrc: Oral Oral Axillary   SpO2: 96% 98% 97%   Weight:      Height:        Intake/Output Summary (Last 24 hours) at 09/19/2023 2012 Last data filed at 09/19/2023 1524 Gross per 24 hour  Intake 700.8 ml  Output 450 ml  Net 250.8 ml   Filed Weights   09/15/23 0500 09/16/23 0500 09/17/23 0500  Weight: 74 kg 71.5 kg 71.1 kg    Examination:   Constitutional: NAD, awake HEENT: conjunctivae and lids normal, EOMI CV: No cyanosis.   RESP: normal respiratory effort,  still very weak cough and mucusy sounding Neuro: II - XII grossly intact.   Psych: subdued mood and affect.     Data Reviewed: I have personally reviewed labs and imaging studies  Time spent: 35 minutes   Darlin Priestly, MD [facility] If 7PM-7AM, please contact night-coverage 09/19/2023, 8:12 PM

## 2023-09-19 NOTE — Progress Notes (Signed)
SLP Cancellation Note  Patient Details Name: Steven Elliott MRN: [id number] DOB: 09-24-1929   Cancelled treatment:       Reason Eval/Treat Not Completed: Patient at procedure or test/unavailable (Pt working with PT. Will continue efforts as appropriate.)  [name], M.S., CCC-SLP Speech-Language Pathologist [facility] [phone] (ASCOM)  [name] 09/19/2023, 9:05 AM

## 2023-09-19 NOTE — Plan of Care (Signed)

## 2023-09-19 NOTE — Evaluation (Signed)
Physical Therapy Re-Evaluation Patient Details Name: Steven Elliott MRN: [id number] DOB: 05-Oct-1929 Today's Date: 09/19/2023  History of Present Illness  89 y.o. male with medical history significant for dementia, chronic kidney disease, COPD, hyponatremia prostate cancer with metastasis to the bone admitted with fall resulting in T11-12 fx. TLSO when OOB.  Respiratory concerns noted during hospitalization.  Clinical Impression  PT re-evaluation performed.  Pt resting in bed upon PT arrival; pt reports 4/10 LBP at rest and declined pain medication prior to session; discussed with pt that pt may benefit from pain medication prior to therapy session (Steven/t anticipated pain with activity) but pt declined any pain medication and agreed to participating in physical therapy.  During session pt mod to max assist with bed mobility via logrolling; mod assist to stand up to RW from elevated bed height; and mod assist to take a couple steps in place (B UE's on RW).  Limited activity Steven/t pt reporting feeling like he was going to pass out in standing so pt assisted with sitting down on bed and then assisted back to bed (pt reporting symptoms resolved resting in bed but reporting 8/10 LBP--nurse notified of pt's request for pain medication and pt's symptoms).  Will continue to focus on strengthening and progressive functional mobility during hospitalization.  PT POC reviewed and updated.      If plan is discharge home, recommend the following: Two people to help with walking and/or transfers;A lot of help with bathing/dressing/bathroom;Assistance with cooking/housework;Assist for transportation;Help with stairs or ramp for entrance   Can travel by private vehicle   No    Equipment Recommendations Wheelchair (measurements PT);Wheelchair cushion (measurements PT) (Pt has RW and hospital bed at home)  Recommendations for Other Services       Functional Status Assessment Patient has had a recent decline in  their functional status and demonstrates the ability to make significant improvements in function in a reasonable and predictable amount of time.     Precautions / Restrictions Precautions Precautions: Fall Precaution Comments: s/p fall at home Required Braces or Orthoses: Spinal Brace Spinal Brace: Thoracolumbosacral orthotic;Applied in sitting position Restrictions Weight Bearing Restrictions Per Provider Order: No      Mobility  Bed Mobility Overal bed mobility: Needs Assistance Bed Mobility: Rolling, Sidelying to Sit, Sit to Sidelying Rolling: Min assist (assist for logrolling to L) Sidelying to sit: Mod assist (assist for trunk)     Sit to sidelying: Max assist (assist for trunk and B LE's) General bed mobility comments: vc's for technique    Transfers Overall transfer level: Needs assistance Equipment used: Rolling walker (2 wheels) Transfers: Sit to/from Stand Sit to Stand: Mod assist           General transfer comment: assist to stand from elevated bed height; vc's for UE/LE placement and overall technique    Ambulation/Gait Ambulation/Gait assistance: Mod assist Gait Distance (Feet):  (pt able to take a couple steps in place) Assistive device: Rolling walker (2 wheels)   Gait velocity: decreased     General Gait Details: limited Steven/t pt reporting feeling like he was going to "pass outMuseum/gallery conservator     Tilt Bed    Modified Rankin (Stroke Patients Only)       Balance Overall balance assessment: Needs assistance Sitting-balance support: Bilateral upper extremity supported, Feet supported Sitting balance-Leahy Scale: Fair Sitting balance - Comments: SBA sitting EOB   Standing balance support:  Bilateral upper extremity supported, During functional activity, Reliant on assistive device for balance Standing balance-Leahy Scale: Poor Standing balance comment: assist to steady in standing                              Pertinent Vitals/Pain Pain Assessment Pain Assessment: 0-10 Pain Score: 8  Pain Location: LBP Pain Descriptors / Indicators: Aching, Sore, Discomfort Pain Intervention(s): Limited activity within patient's tolerance, Monitored during session, Repositioned, Patient requesting pain meds-RN notified (Pt declined pain medication prior to session) HR 91-96 bpm (beginning/end of session at rest) and SpO2 sats 93% or greater on 3 L O2 via nasal cannula during sessions activities.    Home Living Family/patient expects to be discharged to:: Private residence Living Arrangements: Children Available Help at Discharge: Family;Available 24 hours/day Type of Home: House Home Access: Ramped entrance     Alternate Level Stairs-Number of Steps: chair lift to 2nd floor, has to go up to bathe; has hospital bed on main level Home Layout: Two level;Bed/bath upstairs Home Equipment: Tub bench;Hospital bed;Rollator (4 wheels);Rolling Walker (2 wheels)      Prior Function Prior Level of Function : History of Falls (last six months);Needs assist       Physical Assist : Mobility (physical);ADLs (physical) Mobility (physical): Transfers;Gait ADLs (physical): Bathing;Dressing;Toileting Mobility Comments: Per PT eval "Uses RW at all times and has SUP to CGA from daughters at all times; 3 falls in the last 3 months; does not extend knees in standing at baseline" ADLs Comments: Per PT eval "Has assist for all ADLs and IADLs from daughters; one reports she provides more assist than the other"     Extremity/Trunk Assessment   Upper Extremity Assessment Upper Extremity Assessment: Generalized weakness    Lower Extremity Assessment Lower Extremity Assessment: Generalized weakness    Cervical / Trunk Assessment Cervical / Trunk Assessment: Kyphotic  Communication   Communication Communication: No apparent difficulties Following commands: Follows one step commands consistently Cueing  Techniques: Verbal cues  Cognition Arousal: Alert Behavior During Therapy: WFL for tasks assessed/performed Overall Cognitive Status: History of cognitive impairments - at baseline                                 General Comments: Oriented to person only (baseline per eval)        General Comments  Nursing cleared pt for participation in physical therapy.  Pt agreeable to PT session.  Total assist to donn/doff TLSO during session.    Exercises     Assessment/Plan    PT Assessment Patient needs continued PT services  PT Problem List Decreased knowledge of use of DME;Decreased safety awareness;Decreased knowledge of precautions;Decreased activity tolerance;Decreased mobility;Decreased strength;Decreased balance;Pain       PT Treatment Interventions DME instruction;Patient/family education;Stair training;Gait training;Functional mobility training;Therapeutic activities;Therapeutic exercise;Balance training;Neuromuscular re-education;Cognitive remediation    PT Goals (Current goals can be found in the Care Plan section)  Acute Rehab PT Goals Patient Stated Goal: to improve pain and mobility PT Goal Formulation: With patient Time For Goal Achievement: 10/03/23 Potential to Achieve Goals: Fair    Frequency Min 1X/week     Co-evaluation               AM-PAC PT "6 Clicks" Mobility  Outcome Measure Help needed turning from your back to your side while in a flat bed without using bedrails?: A Little Help needed  moving from lying on your back to sitting on the side of a flat bed without using bedrails?: A Lot Help needed moving to and from a bed to a chair (including a wheelchair)?: A Lot Help needed standing up from a chair using your arms (e.g., wheelchair or bedside chair)?: A Lot Help needed to walk in hospital room?: Total Help needed climbing 3-5 steps with a railing? : Total 6 Click Score: 11    End of Session Equipment Utilized During Treatment: Gait  belt;Oxygen;Back brace Activity Tolerance: Other (comment) (Limited Steven/t pt feeling like he was going to pass out (resolved once laying down in bed)) Patient left: in bed;with call bell/phone within reach;with bed alarm set Nurse Communication: Mobility status;Precautions;Other (comment);Patient requests pain meds (pt's symptoms during session) PT Visit Diagnosis: Difficulty in walking, not elsewhere classified (R26.2);Other abnormalities of gait and mobility (R26.89);Muscle weakness (generalized) (M62.81);History of falling (Z91.81);Pain    Time: 1610-9604 PT Time Calculation (min) (ACUTE ONLY): 30 min   Charges:   PT Evaluation $PT Re-evaluation: 1 Re-eval PT Treatments $Therapeutic Activity: 8-22 mins PT General Charges $$ ACUTE PT VISIT: 1 Visit        Hendricks Limes, PT 09/19/23, 1:41 PM

## 2023-09-20 DIAGNOSIS — W19XXXA Unspecified fall, initial encounter: Secondary | ICD-10-CM | POA: Diagnosis not present

## 2023-09-20 DIAGNOSIS — S22080A Wedge compression fracture of T11-T12 vertebra, initial encounter for closed fracture: Secondary | ICD-10-CM | POA: Diagnosis not present

## 2023-09-20 LAB — CBC
HCT: 37.7 % — ABNORMAL LOW (ref 39.0–52.0)
Hemoglobin: 12.1 g/dL — ABNORMAL LOW (ref 13.0–17.0)
MCH: 32.2 pg (ref 26.0–34.0)
MCHC: 32.1 g/dL (ref 30.0–36.0)
MCV: 100.3 fL — ABNORMAL HIGH (ref 80.0–100.0)
Platelets: 262 10*3/uL (ref 150–400)
RBC: 3.76 MIL/uL — ABNORMAL LOW (ref 4.22–5.81)
RDW: 14 % (ref 11.5–15.5)
WBC: 9.1 10*3/uL (ref 4.0–10.5)
nRBC: 0 % (ref 0.0–0.2)

## 2023-09-20 LAB — BASIC METABOLIC PANEL
Anion gap: 11 (ref 5–15)
BUN: 17 mg/dL (ref 8–23)
CO2: 30 mmol/L (ref 22–32)
Calcium: 8.1 mg/dL — ABNORMAL LOW (ref 8.9–10.3)
Chloride: 104 mmol/L (ref 98–111)
Creatinine, Ser: 0.77 mg/dL (ref 0.61–1.24)
GFR, Estimated: >60 mL/min (ref >60)
Glucose, Bld: 108 mg/dL — ABNORMAL HIGH (ref 70–99)
Potassium: 3.7 mmol/L (ref 3.5–5.1)
Sodium: 145 mmol/L (ref 135–145)

## 2023-09-20 LAB — MAGNESIUM: Magnesium: 2.3 mg/dL (ref 1.7–2.4)

## 2023-09-20 MED ORDER — AMOXICILLIN-POT CLAVULANATE 875-125 MG PO TABS
1.0000 | ORAL_TABLET | Freq: Two times a day (BID) | ORAL | Status: AC
  Administered 2023-09-20 – 2023-09-21 (×2): 1 via ORAL
  Filled 2023-09-20 (×2): qty 1

## 2023-09-20 NOTE — Progress Notes (Signed)
SLP Cancellation Note  Patient Details Name: Steven Elliott MRN: [id number] DOB: Aug 06, 1930   Cancelled treatment:       Reason Eval/Treat Not Completed: Other (comment) (patient refused) Attempted visit for diet tolerance. Patient tolerated upright positioning only briefly before insisting to lie back. Declined all POs offered, including items from his lunch tray and floor stock. RN reports some coughing after meal this morning. Continue current diet, aspiration precautions.   Rondel Baton, MS, CCC-SLP Speech-Language Pathologist [facility]: [phone]   Arlana Lindau 09/20/2023, 2:00 PM

## 2023-09-20 NOTE — Progress Notes (Signed)
Physical Therapy Treatment Patient Details Name: Steven Elliott MRN: [id number] DOB: Jan 21, 1930 Today's Date: 09/20/2023   History of Present Illness 89 y.o. male with medical history significant for dementia, chronic kidney disease, COPD, hyponatremia prostate cancer with metastasis to the bone admitted with fall resulting in T11-12 fx. TLSO when OOB.  Respiratory concerns noted during hospitalization.    PT Comments  Pt resting in bed upon PT arrival; pt agreeable to therapy; pt pre-medicated with pain medication.  Pt reporting 5/10 LBP at rest beginning of session; pain increased with activity; LBP 10/10 at rest end of session (nurse notified of pt's pain and request for pain meds; therapist repositioned pt in bed to attempt to improve comfort--pt giving feedback on best positioning).  During session pt max assist with bed mobility via logrolling (2nd assist for safety); mod assist progressing to SBA for sitting balance; mod assist x2 to stand from elevated bed height up to RW; and min assist x2 to side step to L along bed a few feet with RW use.  Limited activity d/t LBP.  Will continue to focus on strengthening, balance, and progressive functional mobility during hospitalization.   If plan is discharge home, recommend the following: Two people to help with walking and/or transfers;A lot of help with bathing/dressing/bathroom;Assistance with cooking/housework;Assist for transportation;Help with stairs or ramp for entrance   Can travel by private vehicle     No  Equipment Recommendations  Wheelchair (measurements PT);Wheelchair cushion (measurements PT) (Pt has RW and hospital bed at home)    Recommendations for Other Services       Precautions / Restrictions Precautions Precautions: Fall Precaution Comments: s/p fall at home Required Braces or Orthoses: Spinal Brace Spinal Brace: Thoracolumbosacral orthotic;Applied in sitting position Restrictions Weight Bearing Restrictions Per  Provider Order: No     Mobility  Bed Mobility Overal bed mobility: Needs Assistance Bed Mobility: Rolling, Sidelying to Sit, Sit to Sidelying Rolling: Min assist (logrolling L/R in bed) Sidelying to sit: Max assist, HOB elevated, +2 for safety/equipment     Sit to sidelying: Max assist, +2 for safety/equipment General bed mobility comments: assist for trunk and B LE's; vc's for technique and logrolling    Transfers Overall transfer level: Needs assistance Equipment used: Rolling walker (2 wheels) Transfers: Sit to/from Stand Sit to Stand: Mod assist, +2 physical assistance           General transfer comment: assist to stand from elevated bed height; vc's for UE/LE placement and overall technique; assist to control descent sitting    Ambulation/Gait Ambulation/Gait assistance: Min assist, +2 physical assistance Gait Distance (Feet):  (side stepped to L along bed a few feet) Assistive device: Rolling walker (2 wheels)   Gait velocity: decreased     General Gait Details: B knees flexed; decreased B LE foot clearance/step length   Stairs             Wheelchair Mobility     Tilt Bed    Modified Rankin (Stroke Patients Only)       Balance Overall balance assessment: Needs assistance Sitting-balance support: Bilateral upper extremity supported, Feet supported Sitting balance-Leahy Scale: Poor Sitting balance - Comments: mod assist progressing to SBA sitting EOB   Standing balance support: Bilateral upper extremity supported, During functional activity, Reliant on assistive device for balance Standing balance-Leahy Scale: Poor Standing balance comment: assist to steady in standing  Cognition Arousal: Alert Behavior During Therapy: WFL for tasks assessed/performed Overall Cognitive Status: History of cognitive impairments - at baseline                                 General Comments: Oriented to  person (baseline per eval) and hospital        Exercises      General Comments  Nursing cleared pt for participation in physical therapy.  Pt agreeable to PT session.      Pertinent Vitals/Pain Pain Assessment Pain Assessment: 0-10 Pain Score: 10-Worst pain ever Pain Location: LBP Pain Descriptors / Indicators: Aching, Sore, Discomfort Pain Intervention(s): Limited activity within patient's tolerance, Monitored during session, Premedicated before session, Repositioned, Patient requesting pain meds-RN notified HR WNL and SpO2 sats 95% or greater on 3 L via nasal cannula during sessions activities.    Home Living                          Prior Function            PT Goals (current goals can now be found in the care plan section) Acute Rehab PT Goals Patient Stated Goal: to improve pain and mobility PT Goal Formulation: With patient Time For Goal Achievement: 10/03/23 Potential to Achieve Goals: Fair Progress towards PT goals: Progressing toward goals    Frequency    Min 1X/week      PT Plan      Co-evaluation              AM-PAC PT "6 Clicks" Mobility   Outcome Measure  Help needed turning from your back to your side while in a flat bed without using bedrails?: A Little Help needed moving from lying on your back to sitting on the side of a flat bed without using bedrails?: A Lot Help needed moving to and from a bed to a chair (including a wheelchair)?: Total Help needed standing up from a chair using your arms (e.g., wheelchair or bedside chair)?: Total Help needed to walk in hospital room?: Total Help needed climbing 3-5 steps with a railing? : Total 6 Click Score: 9    End of Session Equipment Utilized During Treatment: Gait belt;Oxygen;Back brace Activity Tolerance: Patient limited by pain Patient left: in bed;with call bell/phone within reach;with bed alarm set Nurse Communication: Mobility status;Precautions;Patient requests pain  meds;Other (comment) (Pt's pain status) PT Visit Diagnosis: Difficulty in walking, not elsewhere classified (R26.2);Other abnormalities of gait and mobility (R26.89);Muscle weakness (generalized) (M62.81);History of falling (Z91.81);Pain     Time: 1100-1126 PT Time Calculation (min) (ACUTE ONLY): 26 min  Charges:    $Therapeutic Activity: 23-37 mins PT General Charges $$ ACUTE PT VISIT: 1 Visit                    Hendricks Limes, PT 09/20/23, 1:24 PM

## 2023-09-20 NOTE — Progress Notes (Signed)
PROGRESS NOTE    Steven Elliott  FAO:[id number] DOB: [birth year] DOA: 09/08/2023 PCP: [facility]  140A/140A-AA  LOS: 11 days   Brief hospital course:   Assessment & Plan: 89 y.o. male with medical history significant for dementia, chronic kidney disease, COPD, hyponatremia prostate cancer with metastasis to the bone admitted with fall resulting in T11-12 fx.    Acute hypoxemic respiratory distress 2/2 Aspiration --significant mucus buildup in the upper airway, due to aspiration of fluid intake, own secretion, or acid reflux.  RT attempted nasal suctioning which removed some mucus, but pt couldn't tolerate more suctioning.  Transferred to stepdown for heated hf.  Now weaned off o2 --Nasal suctioning as tolerated --chest PT, 3% saline neb --dys 3 with nectar thick --convert unasyn to augmentin for 24 more hours to complete 5 day course --IS and flutter valve  GERD --family believed pt aspirated acid reflux --cont PPI BID  Hospital delirium Baseline dementia --pt trying to pull off heated hf and lines.  RN reported IV haldol didn't work well. --cont seroquel 50 mg at bedtime - resides at home with daughter, has sitters  T11-12 fractures status post fall  -underlying ankylosing spondylitis Neurosurgery advised TLSO brace when out of bed, for 3 months --no tramadol, no morphine, per daughter --Norco PRN   Abnormal LFTs Total bili, direct/ indirect bili up. RUQ sono unremarkable.    COPD (chronic obstructive pulmonary disease) (HCC) On and off hypoxia noted. Encourage incentive spirometry, out of bed. May need home O2 qualification prior to dc. Duonebs as needed.   Dementia (HCC) At baseline. Gets confused and agitated on and off.  Avoid opiates, sedatives as he is high risk for confusion, falls Delirium precautions.   Anemia of chronic disease Stable H&H, monitor   Essential (primary) hypertension BP stable. --cont amlodipine and Lopressor    Cerebral aneurysm, nonruptured CT head shows stable aneurysm   Prostate cancer metastatic to bone (HCC) --cont home Nubeqa  RLQ abdominal pain --described as burning, but not worse with palpation.  CT a/p wo on presentation showed extensive small bowel pneumatosis, thought was present on the PET-CT dated 06/22/2023. --repeat a/p imaging per daughter's request with no additional acute finding    DVT prophylaxis: Lovenox SQ Code Status: Full code  Family Communication: none at bedside. Daughter Steven Elliott updated telephonically 12/30 Level of care: Med-Surg Dispo:   The patient is from: home Anticipated d/c is to: SNF rehab  Anticipated d/c date is: when disposition determined   Subjective and Interval History:  No complaints, denies pain   Objective: Vitals:   09/20/23 0034 09/20/23 0500 09/20/23 0743 09/20/23 0758  BP: 133/70   (!) 134/53  Pulse: 72   82  Resp: 20   19  Temp: 98.9 F (37.2 C)   98.2 F (36.8 C)  TempSrc: Oral   Oral  SpO2: 95%  97% 98%  Weight:  73.9 kg    Height:        Intake/Output Summary (Last 24 hours) at 09/20/2023 1542 Last data filed at 09/20/2023 1305 Gross per 24 hour  Intake 198.66 ml  Output 1100 ml  Net -901.34 ml   Filed Weights   09/16/23 0500 09/17/23 0500 09/20/23 0500  Weight: 71.5 kg 71.1 kg 73.9 kg    Examination:   Constitutional: NAD, awake HEENT: conjunctivae and lids normal,   CV: No cyanosis.   RESP: scattered rhonchi Neuro: moving all 4 Psych: confused, calm.     Data Reviewed: I have personally  reviewed labs and imaging studies    Silvano Bilis, MD [facility] If 7PM-7AM, please contact night-coverage 09/20/2023, 3:42 PM

## 2023-09-21 DIAGNOSIS — S22080A Wedge compression fracture of T11-T12 vertebra, initial encounter for closed fracture: Secondary | ICD-10-CM | POA: Diagnosis not present

## 2023-09-21 NOTE — Progress Notes (Signed)
 Speech Language Pathology Treatment: Dysphagia  Patient Details Name: Steven Elliott MRN: [id number] DOB: November 30, 1929 Today's Date: 09/21/2023 Time: 1130-1140 SLP Time Calculation (min) (ACUTE ONLY): 10 min  Assessment / Plan / Recommendation Clinical Impression  Patient seen for skilled ST treatment follow-up for dysphagia, however limited cooperation/participation with session. Repositioned upright, which pt tolerated for only a few moments due to pain. Per nsg pt is refusing most food, eating and drinking minimal amounts. She has been giving meds crushed which he has tolerated. Pt accepted limited trials of nectar thick liquid, he was able to hold cup with some assistance. Oral phase appears [facility] for nectar liquids in 2/2 trials and there are no overt s/sx aspiration. Pt declined further sips but was willing to try sip of thin water, which was followed by immediate, congested cough, suggestive of reduced airway protection. Pt refused further trials and all foods offered. Pt was seen by palliative care 09/10/23 and family declined to discuss goals of care while pt in shared room. No family present to discuss current status, however may benefit from further palliative care discussions given poor intake. Continue dysphagia 3/ nectar liquids with supervision/feeding assistance, meds whole in puree, or crushed as needed. SLP will continue to follow briefly.      HPI HPI: Pt is a 89 y.o. male with medical history significant for Dementia, chronic kidney disease, COPD, hyponatremia prostate cancer with Metastasis to the bone admitted with fall resulting in T11-12 fx. Neurosurgery recommending TLSO brace and conservative care. CT Abdomen Pelvis revealed 1. 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 In retrospect this was present on the PET-CT dated 06/22/2023. Findings may be related to factors such as benign small bowel pneumatosis, which may be related to  factors such as chronic mechanical stress (constipation), subclinical mesenteric ischemia, vascular changes from AAA repair, or radiation effects (if applicable). Advise close clinical monitoring and follow-up imaging (CT mesenteric angiogram) if there are clinical concerns for  mesenteric ischemia 2. Within the left upper quadrant of the abdomen there are several loops of small bowel which are increased in caliber measuring up to  3.7 cm with normal caliber mid and distal small bowel loops. Findings are favored to represent ileus. 3. Status post stent graft repair of abdominal aortic aneurysm. Aneurysm sac measures 4.5 x 3.6 cm. Unchanged from 06/22/2023. 4. 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 Rigid spine fracture across the T11-T12 level as described on lumbar spine CT from 09/08/23. 5. 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 6. Aortic Atherosclerosis and Emphysema.  OF NOTE: per discussion w/ Family, Pt is currently on HFNC O2 support 35L; FiO2 40%. Respiratory status is much improved but still decline for breath support for speech -- only able to speak ~2-3 words consecutively.   Daughters present and endorsed (along w/ pt report) s/s of REFLUX at Baseline. They explained that pt had had acid reflux in recent days; pt stated my stomach went pow; the acid burned my me; I have a lot of phlegm. Pt is not on a PPI currently per chart orders.  CT Angio and CXR Imaging on 09/14/2023: Per CT Angio/Abd and CXR yesterday: Lower chest- Emphysematous changes and fibrosis in the lungs. No focal  consolidation. Small pleural effusions, improved on the left since prior study. Dependent atelectasis in volume  loss noted within the lung bases.  Pt has been NPO since 09/14/2023  when he had a Rapid Response status and transferred to CCU level care.      SLP Plan  Continue with current plan of care      Recommendations for  follow up therapy are one component of a multi-disciplinary discharge planning process, led by the attending physician.  Recommendations may be updated based on patient status, additional functional criteria and insurance authorization.    Recommendations  Diet recommendations: Dysphagia 3 (mechanical soft);Nectar-thick liquid Liquids provided via: Cup;Straw (monitor straw sips) Medication Administration: Whole meds with puree Supervision: Staff to assist with self feeding;Full supervision/cueing for compensatory strategies Compensations: Minimize environmental distractions;Slow rate;Small sips/bites Postural Changes and/or Swallow Maneuvers: Seated upright 90 degrees (upright 60-90 min after POs)                              Continue with current plan of care   Steven Landry [place], MS, CCC-SLP Speech-Language Pathologist [facility]: [phone]   Steven Elliott  09/21/2023, 11:55 AM

## 2023-09-21 NOTE — Progress Notes (Signed)
 PT Cancellation Note  Patient Details Name: ZAYVIAN MCMURTRY MRN: [id number] DOB: Apr 29, 1930   Cancelled Treatment:     Several attempts to see pt today. This am after being pre-medicated, pt too painful to tolerate boost up in bed. Therapist returned several times in pm, pt with increased confusion, broken plates on floor and other objects. Will re-attempt next available date/time per POC.   Darice JAYSON Bohr 09/21/2023, 3:13 PM

## 2023-09-21 NOTE — Progress Notes (Signed)
 PROGRESS NOTE    Steven Elliott  FMW:[id number] DOB: 31-Jul-1930 DOA: 09/08/2023 PCP: [facility], [facility]  140A/140A-AA  LOS: 12 days   Brief hospital course:   Assessment & Plan: 89 y.o. male with medical history significant for dementia, chronic kidney disease, COPD, hyponatremia prostate cancer with metastasis to the bone admitted with fall resulting in T11-12 fx.    Acute hypoxemic respiratory distress 2/2 Aspiration --significant mucus buildup in the upper airway, due to aspiration of fluid intake, own secretion, or acid reflux.  RT attempted nasal suctioning which removed some mucus, but pt couldn't tolerate more suctioning.  Transferred to stepdown for heated hf.  Now weaned to intermittent o2 --Nasal suctioning as tolerated --chest PT, 3% saline neb --dys 3 with nectar thick --completes course of abx today (unasyn ) --IS and flutter valve  GERD --family believed pt aspirated acid reflux --cont PPI BID  Hospital delirium Baseline dementia --pt trying to pull off heated hf and lines.  RN reported IV haldol  didn't work well. --cont seroquel  50 mg at bedtime - resides at home with daughter, has sitters  T11-12 fractures status post fall  -underlying ankylosing spondylitis Neurosurgery advised TLSO brace when out of bed, for 3 months --no tramadol , no morphine , per daughter --Norco PRN   Abnormal LFTs Total bili, direct/ indirect bili up. RUQ sono unremarkable.    COPD (chronic obstructive pulmonary disease) (HCC) On and off hypoxia noted. Encourage incentive spirometry, out of bed. May need home O2 qualification prior to dc. Duonebs as needed.   Dementia (HCC) At baseline. Gets confused and agitated on and off.  Avoid opiates, sedatives as he is high risk for confusion, falls Delirium precautions.   Anemia of chronic disease Stable H&H, monitor   Essential (primary) hypertension BP stable. --cont amlodipine  and Lopressor    Cerebral aneurysm,  nonruptured CT head shows stable aneurysm   Prostate cancer metastatic to bone (HCC) --cont home Nubeqa   RLQ abdominal pain --described as burning, but not worse with palpation.  CT a/p wo on presentation showed extensive small bowel pneumatosis, thought was present on the PET-CT dated 06/22/2023. --repeat a/p imaging per daughter's request with no additional acute finding - no recent complaints of abdominal pain    DVT prophylaxis: Lovenox  SQ Code Status: Full code  Family Communication: Daughter Steven Elliott updated telephonically 12/30. Daughter Steven Elliott updated @ bedside 12/31 Level of care: Med-Surg Dispo:   The patient is from: home Anticipated d/c is to: SNF rehab  Anticipated d/c date is: when disposition determined   Subjective and Interval History:  No complaints, denies pain   Objective: Vitals:   09/20/23 2330 09/21/23 0500 09/21/23 1014 09/21/23 1514  BP: (!) 146/75  (!) 142/74 129/62  Pulse: 75  78 72  Resp: 17  20 16   Temp: 97.8 F (36.6 C)  98.7 F (37.1 C) 98 F (36.7 C)  TempSrc:   Oral   SpO2:    96%  Weight:  74.4 kg    Height:        Intake/Output Summary (Last 24 hours) at 09/21/2023 1520 Last data filed at 09/21/2023 1208 Gross per 24 hour  Intake 150 ml  Output 850 ml  Net -700 ml   Filed Weights   09/17/23 0500 09/20/23 0500 09/21/23 0500  Weight: 71.1 kg 73.9 kg 74.4 kg    Examination:   Constitutional: NAD, asleep HEENT: conjunctivae and lids normal,   CV: No cyanosis.   RESP: scattered rhonchi Neuro: moving all 4 Psych: calm.  Data Reviewed: I have personally reviewed labs and imaging studies    Steven KATHEE Ban, MD [facility] If 7PM-7AM, please contact night-coverage 09/21/2023, 3:20 PM

## 2023-09-21 NOTE — TOC Progression Note (Signed)
 Transition of Care [facility]) - Progression Note    Patient Details  Name: Steven Elliott MRN: [id number] Date of Birth: [birth year]  [facility] CM/SW Contact  Teana Lindahl A Jaleeyah Munce, RN Phone Number: 09/21/2023, 11:04 AM  Clinical Narrative:    Chart reviewed.  Noted that patient has no bed offers.  I will extend bed search to other facilities in the [place] area.   TOC will continue to follow for discharge planning.          Expected Discharge Plan and Services                                               Social Determinants of Health (SDOH) Interventions SDOH Screenings   Food Insecurity: No Food Insecurity (09/09/2023)  Housing: Low Risk  (09/09/2023)  Transportation Needs: No Transportation Needs (09/09/2023)  Utilities: Not At Risk (09/09/2023)  Social Connections: Patient Unable To Answer (09/21/2023)  Tobacco Use: Medium Risk (09/08/2023)    Readmission Risk Interventions    09/09/2023    1:41 PM  Readmission Risk Prevention Plan  Transportation Screening Complete  PCP or Specialist Appt within 3-5 Days Complete  HRI or Home Care Consult Complete  Social Work Consult for Recovery Care Planning/Counseling Complete  Palliative Care Screening Complete  Medication Review Oceanographer) Complete

## 2023-09-21 NOTE — Plan of Care (Signed)
   Problem: Education: Goal: Knowledge of General Education information will improve Description: Including pain rating scale, medication(s)/side effects and non-pharmacologic comfort measures Outcome: Progressing   Problem: Safety: Goal: Ability to remain free from injury will improve Outcome: Progressing   Problem: Skin Integrity: Goal: Risk for impaired skin integrity will decrease Outcome: Progressing

## 2023-09-21 NOTE — Plan of Care (Signed)

## 2023-09-22 DIAGNOSIS — S22080A Wedge compression fracture of T11-T12 vertebra, initial encounter for closed fracture: Secondary | ICD-10-CM | POA: Diagnosis not present

## 2023-09-22 NOTE — Plan of Care (Signed)

## 2023-09-22 NOTE — TOC Progression Note (Signed)
 Transition of Care [facility]) - Progression Note    Patient Details  Name: Steven Elliott MRN: [id number] Date of Birth: 1930/07/24  [facility] CM/SW Contact  Ladene Lady, LCSW Phone Number: 09/22/2023, 10:07 AM  Clinical Narrative:  Message left with daughter [name] to discuss bed offers.         Expected Discharge Plan and Services                                               Social Determinants of Health (SDOH) Interventions SDOH Screenings   Food Insecurity: No Food Insecurity (09/09/2023)  Housing: Low Risk  (09/09/2023)  Transportation Needs: No Transportation Needs (09/09/2023)  Utilities: Not At Risk (09/09/2023)  Social Connections: Patient Unable To Answer (09/21/2023)  Tobacco Use: Medium Risk (09/08/2023)    Readmission Risk Interventions    09/09/2023    1:41 PM  Readmission Risk Prevention Plan  Transportation Screening Complete  PCP or Specialist Appt within 3-5 Days Complete  HRI or Home Care Consult Complete  Social Work Consult for Recovery Care Planning/Counseling Complete  Palliative Care Screening Complete  Medication Review Oceanographer) Complete

## 2023-09-22 NOTE — Progress Notes (Signed)
 PROGRESS NOTE    Steven Elliott  FMW:[id number] DOB: [birth year] DOA: 09/08/2023 PCP: [facility]  140A/140A-AA  LOS: 13 days   Brief hospital course:   Assessment & Plan: 89 y.o. male with medical history significant for dementia, chronic kidney disease, COPD, hyponatremia prostate cancer with metastasis to the bone admitted with fall resulting in T11-12 fx.    Acute hypoxemic respiratory distress 2/2 Aspiration --significant mucus buildup in the upper airway, due to aspiration of fluid intake, own secretion, or acid reflux.  RT attempted nasal suctioning which removed some mucus, but pt couldn't tolerate more suctioning.  Transferred to stepdown for heated hf.  Now weaned to intermittent o2 --Nasal suctioning as tolerated --chest PT, 3% saline neb --dys 3 with nectar thick --completed course of abx (unasyn ) --IS and flutter valve --cont PPI BID  Hospital delirium Baseline dementia --pt trying to pull off heated hf and lines.  RN reported IV haldol  didn't work well. --cont seroquel  50 mg at bedtime - resides at home with daughter, has sitters  T11-12 fractures status post fall  -underlying ankylosing spondylitis Neurosurgery advised TLSO brace when out of bed, for 3 months --no tramadol , no morphine , per daughter --Norco PRN   Abnormal LFTs Total bili, direct/ indirect bili up. RUQ sono unremarkable.    COPD (chronic obstructive pulmonary disease) (HCC) On and off hypoxia noted. Encourage incentive spirometry, out of bed. May need home O2 qualification prior to dc. Duonebs as needed.   Dementia (HCC) At baseline. Gets confused and agitated on and off.  Avoid opiates, sedatives as he is high risk for confusion, falls Delirium precautions.   Anemia of chronic disease Stable H&H, monitor   Essential (primary) hypertension BP stable. --cont amlodipine  and Lopressor    Cerebral aneurysm, nonruptured CT head shows stable aneurysm   Prostate  cancer metastatic to bone (HCC) --cont home Nubeqa   RLQ abdominal pain --described as burning, but not worse with palpation.  CT a/p wo on presentation showed extensive small bowel pneumatosis, thought was present on the PET-CT dated 06/22/2023. --repeat a/p imaging per daughter's request with no additional acute finding - no recent complaints of abdominal pain    DVT prophylaxis: Lovenox  SQ Code Status: Full code  Family Communication: Daughter clyda updated telephonically 12/30. Daughter donna updated @ bedside 12/31 Level of care: Med-Surg Dispo:   The patient is from: home Anticipated d/c is to: SNF rehab vs home with home health. Family desires [facility] but they have declined him. Has 3 bed offers. Shared with family that we will need to discharge to a place with bed offers or arrange home health Anticipated d/c date is: 1/2   Subjective and Interval History:  No complaints, denies pain, confused   Objective: Vitals:   09/21/23 2214 09/22/23 0500 09/22/23 0753 09/22/23 0802  BP: (!) 154/71  (!) 147/58   Pulse: 76  78   Resp: 14  18   Temp: 97.9 F (36.6 C)  98.2 F (36.8 C)   TempSrc:   Oral   SpO2: 97%  100% 97%  Weight:  75.1 kg    Height:       No intake or output data in the 24 hours ending 09/22/23 1518  Filed Weights   09/20/23 0500 09/21/23 0500 09/22/23 0500  Weight: 73.9 kg 74.4 kg 75.1 kg    Examination:   Constitutional: NAD, sitting up in chair HEENT: conjunctivae and lids normal,   CV: No cyanosis.   RESP: scattered rhonchi Neuro: moving  all 4 Psych: calm.     Data Reviewed: I have personally reviewed labs and imaging studies    [name], MD [facility] If 7PM-7AM, please contact night-coverage 09/22/2023, 3:18 PM

## 2023-09-23 DIAGNOSIS — S22080A Wedge compression fracture of T11-T12 vertebra, initial encounter for closed fracture: Secondary | ICD-10-CM | POA: Diagnosis not present

## 2023-09-23 LAB — CBC
HCT: 37 % — ABNORMAL LOW (ref 39.0–52.0)
Hemoglobin: 12.4 g/dL — ABNORMAL LOW (ref 13.0–17.0)
MCH: 32 pg (ref 26.0–34.0)
MCHC: 33.5 g/dL (ref 30.0–36.0)
MCV: 95.6 fL (ref 80.0–100.0)
Platelets: 270 10*3/uL (ref 150–400)
RBC: 3.87 MIL/uL — ABNORMAL LOW (ref 4.22–5.81)
RDW: 13.6 % (ref 11.5–15.5)
WBC: 9.2 10*3/uL (ref 4.0–10.5)
nRBC: 0 % (ref 0.0–0.2)

## 2023-09-23 LAB — BASIC METABOLIC PANEL
Anion gap: 9 (ref 5–15)
BUN: 15 mg/dL (ref 8–23)
CO2: 30 mmol/L (ref 22–32)
Calcium: 7.9 mg/dL — ABNORMAL LOW (ref 8.9–10.3)
Chloride: 99 mmol/L (ref 98–111)
Creatinine, Ser: 0.78 mg/dL (ref 0.61–1.24)
GFR, Estimated: >60 mL/min (ref >60)
Glucose, Bld: 108 mg/dL — ABNORMAL HIGH (ref 70–99)
Potassium: 3.8 mmol/L (ref 3.5–5.1)
Sodium: 138 mmol/L (ref 135–145)

## 2023-09-23 NOTE — TOC Progression Note (Signed)
 Transition of Care [facility]) - Progression Note    Patient Details  Name: Steven Elliott MRN: [id number] Date of Birth: March 28, 1930  [facility] CM/SW Contact  [name], RN Phone Number: 09/23/2023, 10:31 AM  Clinical Narrative:     Spoke with daughter [name], She is not pleased with the bed offers that are currently offering, She stated that she has spoke with [name] and they have a bed, I let her know that I would be happy to reach out to [name] but at this time they have not made a bed offer, She requested that I do reach out to them, I called [name] at [name], she stated that they are reviewing his case to determine if they can meet his needs and will let me know if they can make a bed offer, they will go under his Medicare Part A if so.          Expected Discharge Plan and Services                                               Social Determinants of Health (SDOH) Interventions SDOH Screenings   Food Insecurity: No Food Insecurity (09/09/2023)  Housing: Low Risk  (09/09/2023)  Transportation Needs: No Transportation Needs (09/09/2023)  Utilities: Not At Risk (09/09/2023)  Social Connections: Patient Unable To Answer (09/21/2023)  Tobacco Use: Medium Risk (09/08/2023)    Readmission Risk Interventions    09/09/2023    1:41 PM  Readmission Risk Prevention Plan  Transportation Screening Complete  PCP or Specialist Appt within 3-5 Days Complete  HRI or Home Care Consult Complete  Social Work Consult for Recovery Care Planning/Counseling Complete  Palliative Care Screening Complete  Medication Review [employer]) Complete

## 2023-09-23 NOTE — Progress Notes (Signed)
 Pt seen for bed mobility, standing at bedside, and transfer bed > chair > bed. Increased assistance needed for transfers due to weakness and L groin thigh discomfort. Pt also slightly more confused than previous week. Daughters present to assist with care and mobility. Pt incontinent of stool once sitting EOB and refused TLSO application. Squat pivot with MaxA bed<>chair with caution placed on safe mobility and spine precautions. Pt tolerated ~3 hours up in chair. No c/o back pain with mobility today.    09/22/23 1600  PT Visit Information  Assistance Needed +2  History of Present Illness 89 y.o. male with medical history significant for dementia, chronic kidney disease, COPD, hyponatremia prostate cancer with metastasis to the bone admitted with fall resulting in T11-12 fx. TLSO when OOB.  Respiratory concerns noted during hospitalization.  Subjective Data  Patient Stated Goal to improve pain and mobility  Precautions  Precautions Fall  Precaution Comments s/p fall at home  Required Braces or Orthoses Spinal Brace  Spinal Brace TLSO;Applied in sitting position  Restrictions  Weight Bearing Restrictions Per Provider Order No  Pain Assessment  Pain Assessment Faces  Faces Pain Scale 2  Pain Location Left Groin and thigh  Pain Descriptors / Indicators Aching;Sore;Discomfort  Pain Intervention(s) Limited activity within patient's tolerance  Cognition  Arousal Alert  Behavior During Therapy [facility] for tasks assessed/performed  Overall Cognitive Status History of cognitive impairments - at baseline  General Comments Oriented to person (baseline per eval) and hospital  Bed Mobility  Overal bed mobility Needs Assistance  Bed Mobility Rolling;Sidelying to Sit;Sit to Sidelying  Rolling Mod assist;Used rails  Sidelying to sit Max assist;HOB elevated;Used rails  Sit to sidelying Max assist;+2 for safety/equipment  General bed mobility comments assist for trunk and B LE's; vc's for technique and  logrolling  Transfers  Overall transfer level Needs assistance  Equipment used None  Transfers Bed to chair/wheelchair/BSC  Sit to Stand Max assist;From elevated surface  Bed to/from chair/wheelchair/BSC transfer type: Squat pivot  Squat pivot transfers Max assist  General transfer comment MaxA to stand and maintain standing while receiving hygiene assist after BM.  Ambulation/Gait  General Gait Details Unable to tolerate  Balance  Overall balance assessment Needs assistance  Sitting-balance support Bilateral upper extremity supported;Feet supported  Sitting balance-Leahy Scale Fair  Sitting balance - Comments min assist progressing to SBA sitting EOB  Standing balance support Bilateral upper extremity supported;During functional activity;Reliant on assistive device for balance  Standing balance-Leahy Scale Zero  Standing balance comment Unable to tolerate standing  General Comments  General comments (skin integrity, edema, etc.) Daughters in room to assist with care and transfers as well as orient pt to complete tasks  PT - End of Session  Equipment Utilized During Treatment Oxygen  Activity Tolerance Patient limited by pain  Patient left in chair;with bed alarm set;with family/visitor present;with call bell/phone within reach  Nurse Communication Mobility status;Other (comment)   PT - Assessment/Plan  PT Visit Diagnosis Difficulty in walking, not elsewhere classified (R26.2);Other abnormalities of gait and mobility (R26.89);Muscle weakness (generalized) (M62.81);History of falling (Z91.81);Pain  Pain - Right/Left Left  Pain - part of body Leg  PT Frequency (ACUTE ONLY) Min 1X/week  Follow Up Recommendations Skilled nursing-short term rehab (<3 hours/day)  Can patient physically be transported by private vehicle No  Patient can return home with the following Two people to help with walking and/or transfers;A lot of help with bathing/dressing/bathroom;Assistance with  cooking/housework;Assist for transportation;Help with stairs or ramp for entrance  PT equipment Wheelchair (measurements PT);Wheelchair cushion (measurements PT)  AM-PAC PT 6 Clicks Mobility Outcome Measure (Version 2)  Help needed turning from your back to your side while in a flat bed without using bedrails? 2  Help needed moving from lying on your back to sitting on the side of a flat bed without using bedrails? 2  Help needed moving to and from a bed to a chair (including a wheelchair)? 1  Help needed standing up from a chair using your arms (e.g., wheelchair or bedside chair)? 1  Help needed to walk in hospital room? 1  Help needed climbing 3-5 steps with a railing?  1  6 Click Score 8  Consider Recommendation of Discharge To: CIR/SNF/LTACH  Progressive Mobility  What is the highest level of mobility based on the progressive mobility assessment? Level 2 (Chairfast) - Balance while sitting on edge of bed and cannot stand  Activity Transferred from bed to chair;Transferred from chair to bed;Stood at bedside  PT Time Calculation  PT Start Time (ACUTE ONLY) 1530  PT Stop Time (ACUTE ONLY) 1544  PT Time Calculation (min) (ACUTE ONLY) 14 min  PT General Charges  $$ ACUTE PT VISIT 1 Visit  PT Treatments  $Therapeutic Activity 8-22 mins   Darice Bohr, PTA

## 2023-09-23 NOTE — Progress Notes (Signed)
 Several attempts to encourage mobility, pt declined primarily due to pain and baseline dementia. Session focused on ROM/Strengthening of B LE's and positioning to facilitate skin integrity and pulmonary toileting.   09/22/23 1200  PT Visit Information  Assistance Needed +2  History of Present Illness 89 y.o. male with medical history significant for dementia, chronic kidney disease, COPD, hyponatremia prostate cancer with metastasis to the bone admitted with fall resulting in T11-12 fx. TLSO when OOB.  Respiratory concerns noted during hospitalization.  Subjective Data  Patient Stated Goal to improve pain and mobility  Precautions  Precautions Fall  Precaution Comments s/p fall at home  Required Braces or Orthoses Spinal Brace  Spinal Brace TLSO;Applied in sitting position  Restrictions  Weight Bearing Restrictions Per Provider Order No  Pain Assessment  Pain Assessment Faces  Faces Pain Scale 6  Pain Location LBP  Pain Descriptors / Indicators Aching;Sore;Discomfort  Pain Intervention(s) Limited activity within patient's tolerance  Cognition  Arousal Alert  Behavior During Therapy WFL for tasks assessed/performed  Overall Cognitive Status History of cognitive impairments - at baseline  General Comments Oriented to person (baseline per eval) and hospital  Bed Mobility  General bed mobility comments Pt refused  General Comments  General comments (skin integrity, edema, etc.) B heels red and sore with palpation R>L, Nursing notified.  Exercises  Exercises General Lower Extremity;Other exercises  General Exercises - Lower Extremity  Ankle Circles/Pumps AAROM;Both;10 reps;Supine  Heel Slides AAROM;Both;10 reps;Supine  Hip ABduction/ADduction AAROM;Both;10 reps;Supine  PT - End of Session  Equipment Utilized During Treatment Oxygen  Activity Tolerance Patient limited by pain  Patient left in bed;with call bell/phone within reach;with bed alarm set  Nurse Communication Mobility  status;Other (comment) (Pt refusing OOB, B heel soreness)   PT - Assessment/Plan  PT Visit Diagnosis Difficulty in walking, not elsewhere classified (R26.2);Other abnormalities of gait and mobility (R26.89);Muscle weakness (generalized) (M62.81);History of falling (Z91.81);Pain  Pain - part of body  (Back)  PT Frequency (ACUTE ONLY) Min 1X/week  Follow Up Recommendations Skilled nursing-short term rehab (<3 hours/day)  Can patient physically be transported by private vehicle No  Patient can return home with the following Two people to help with walking and/or transfers;A lot of help with bathing/dressing/bathroom;Assistance with cooking/housework;Assist for transportation;Help with stairs or ramp for entrance  PT equipment Wheelchair (measurements PT);Wheelchair cushion (measurements PT) (Pt has a RW and Hospital bed at home)  AM-PAC PT 6 Clicks Mobility Outcome Measure (Version 2)  Help needed turning from your back to your side while in a flat bed without using bedrails? 2  Help needed moving from lying on your back to sitting on the side of a flat bed without using bedrails? 2  Help needed moving to and from a bed to a chair (including a wheelchair)? 1  Help needed standing up from a chair using your arms (e.g., wheelchair or bedside chair)? 1  Help needed to walk in hospital room? 1  Help needed climbing 3-5 steps with a railing?  1  6 Click Score 8  Consider Recommendation of Discharge To: CIR/SNF/LTACH  Progressive Mobility  What is the highest level of mobility based on the progressive mobility assessment? Level 2 (Chairfast) - Balance while sitting on edge of bed and cannot stand  Activity Turned to left side  PT Time Calculation  PT Start Time (ACUTE ONLY) 1025  PT Stop Time (ACUTE ONLY) 1051  PT Time Calculation (min) (ACUTE ONLY) 26 min  PT General Charges  $$ ACUTE PT  VISIT 1 Visit  PT Treatments  $Therapeutic Exercise 8-22 mins  $Therapeutic Activity 8-22 mins   Darice Bohr, PTA

## 2023-09-23 NOTE — Plan of Care (Signed)
  Problem: Clinical Measurements: Goal: Ability to maintain clinical measurements within normal limits will improve Outcome: Progressing   Problem: Activity: Goal: Risk for activity intolerance will decrease Outcome: Progressing   Problem: Elimination: Goal: Will not experience complications related to bowel motility Outcome: Progressing Goal: Will not experience complications related to urinary retention Outcome: Progressing   Problem: Pain Management: Goal: General experience of comfort will improve Outcome: Progressing   Problem: Safety: Goal: Ability to remain free from injury will improve Outcome: Progressing

## 2023-09-23 NOTE — Progress Notes (Signed)
 Speech Language Pathology Treatment: Dysphagia  Patient Details Name: Steven Elliott MRN: [id number] DOB: 05/23/1930 Today's Date: 09/23/2023 Time: 1216-1226 SLP Time Calculation (min) (ACUTE ONLY): 10 min  Assessment / Plan / Recommendation Clinical Impression  Pt seen at bedside for continued education and limited assessment of diet tolerance. Pt denies difficulty difficulty swallowing, but states he has a poor appetite. He is unable to tolerate upright position today, due to increased pain. He would accept only sips of nectar thick liquid in reclined position, refusing trials of dys3 lunch tray. No overt s/s aspiration observed on sips of nectar thick liquid. RN reports pt eats only bites at a time, but tolerates intake well. Will continue current diet.   HPI HPI: Pt is a 89 y.o. male with medical history significant for Dementia, chronic kidney disease, COPD, hyponatremia prostate cancer with Metastasis to the bone admitted with fall resulting in T11-12 fx. Neurosurgery recommending TLSO brace and conservative care. CT Abdomen Pelvis revealed 1. 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 In retrospect this was present on the PET-CT dated 06/22/2023. Findings may be related to factors such as benign small bowel pneumatosis, which may be related to factors such as chronic mechanical stress (constipation), subclinical mesenteric ischemia, vascular changes from AAA repair, or radiation effects (if applicable). Advise close clinical monitoring and follow-up imaging (CT mesenteric angiogram) if there are clinical concerns for  mesenteric ischemia 2. Within the left upper quadrant of the abdomen there are several loops of small bowel which are increased in caliber measuring up to  3.7 cm with normal caliber mid and distal small bowel loops. Findings are favored to represent ileus. 3. Status post stent graft repair of abdominal aortic aneurysm. Aneurysm sac  measures 4.5 x 3.6 cm. Unchanged from 06/22/2023. 4. 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 Rigid spine fracture across the T11-T12 level as described on lumbar spine CT from 09/08/23. 5. 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 6. Aortic Atherosclerosis and Emphysema.  OF NOTE: per discussion w/ Family, Pt is currently on HFNC O2 support 35L; FiO2 40%. Respiratory status is much improved but still decline for breath support for speech -- only able to speak ~2-3 words consecutively.   Daughters present and endorsed (along w/ pt report) s/s of REFLUX at Baseline. They explained that pt had had acid reflux in recent days; pt stated my stomach went pow; the acid burned my me; I have a lot of phlegm. Pt is not on a PPI currently per chart orders.  CT Angio and CXR Imaging on 09/14/2023: Per CT Angio/Abd and CXR yesterday: Lower chest- Emphysematous changes and fibrosis in the lungs. No focal  consolidation. Small pleural effusions, improved on the left since prior study. Dependent atelectasis in volume  loss noted within the lung bases.  Pt has been NPO since 09/14/2023 when he had a Rapid Response status and transferred to CCU level care.      SLP Plan  Continue with current plan of care      Recommendations for follow up therapy are one component of a multi-disciplinary discharge planning process, led by the attending physician.  Recommendations may be updated based on patient status, additional functional criteria and insurance authorization.    Recommendations  Diet recommendations: Dysphagia 3 (mechanical soft);Nectar-thick liquid Liquids provided via: Cup;Straw Medication Administration: Whole meds with puree Supervision: Staff to assist with self feeding;Full supervision/cueing  for compensatory strategies Compensations: Minimize environmental distractions;Slow rate;Small sips/bites Postural Changes  and/or Swallow Maneuvers: Seated upright 90 degrees                  Oral care BID;Oral care before and after PO;Staff/trained caregiver to provide oral care   Frequent or constant Supervision/Assistance Dysphagia, oropharyngeal phase (R13.12)     Continue with current plan of care    Kadence Mikkelson B. Dory, MSP, CCC-SLP Speech Language Pathologist  Dory Caprice Daring 09/23/2023, 12:32 PM

## 2023-09-23 NOTE — Progress Notes (Signed)
 PROGRESS NOTE    Steven Elliott  FMW:[id number] DOB: Mar 08, 1930 DOA: 09/08/2023 PCP: [facility]  140A/140A-AA  LOS: 14 days   Brief hospital course:   Assessment & Plan: 89 y.o. male with medical history significant for dementia, chronic kidney disease, COPD, hyponatremia prostate cancer with metastasis to the bone admitted with fall resulting in T11-12 fx.    Acute hypoxemic respiratory distress 2/2 Aspiration --significant mucus buildup in the upper airway, due to aspiration of fluid intake, own secretion, or acid reflux.  RT attempted nasal suctioning which removed some mucus, but pt couldn't tolerate more suctioning.  Transferred to stepdown for heated hf.  Now weaned to intermittent o2 --Nasal suctioning as tolerated --chest PT, 3% saline neb --dys 3 with nectar thick --completed course of abx (unasyn ) --IS and flutter valve --cont PPI BID  Hospital delirium Baseline dementia --cont seroquel  50 mg at bedtime - resides at home with daughter, has sitters  T11-12 fractures status post fall  -underlying ankylosing spondylitis Neurosurgery advised TLSO brace when out of bed, for 3 months --no tramadol , no morphine , per daughter --Norco PRN   Abnormal LFTs Total bili, direct/ indirect bili up. RUQ sono unremarkable.    COPD (chronic obstructive pulmonary disease) (HCC) On and off hypoxia noted. Encourage incentive spirometry, out of bed. May need home O2 qualification prior to dc. Duonebs as needed.   Dementia (HCC) At baseline. Gets confused and agitated on and off.  Avoid opiates, sedatives as he is high risk for confusion, falls Delirium precautions.   Anemia of chronic disease Stable H&H, monitor   Essential (primary) hypertension BP stable. --cont amlodipine  and Lopressor    Cerebral aneurysm, nonruptured CT head shows stable aneurysm   Prostate cancer metastatic to bone (HCC) --cont home Nubeqa   RLQ abdominal pain --described as  burning, but not worse with palpation.  CT a/p wo on presentation showed extensive small bowel pneumatosis, thought was present on the PET-CT dated 06/22/2023. --repeat a/p imaging per daughter's request with no additional acute finding - no recent complaints of abdominal pain    DVT prophylaxis: Lovenox  SQ Code Status: Full code  Family Communication: daughters Steven Elliott and Steven Elliott updated telephonically 1/2 Level of care: Med-Surg Dispo:   The patient is from: home Anticipated d/c is to: SNF rehab vs home with home health. Family desires [facility] but they have declined him. Has 3 bed offers. Shared with family that we will need to discharge to a place with bed offers or arrange home health Anticipated d/c date is: 1/2   Subjective and Interval History:  No complaints, denies pain, confused but calm   Objective: Vitals:   09/22/23 0753 09/22/23 0802 09/23/23 0158 09/23/23 0801  BP: (!) 147/58  137/67 (!) 146/91  Pulse: 78  71 78  Resp: 18  20 16   Temp: 98.2 F (36.8 C)  98.1 F (36.7 C) (!) 97.3 F (36.3 C)  TempSrc: Oral     SpO2: 100% 97% 92% 91%  Weight:      Height:        Intake/Output Summary (Last 24 hours) at 09/23/2023 1429 Last data filed at 09/23/2023 0415 Gross per 24 hour  Intake 60 ml  Output 600 ml  Net -540 ml    Filed Weights   09/20/23 0500 09/21/23 0500 09/22/23 0500  Weight: 73.9 kg 74.4 kg 75.1 kg    Examination:   Constitutional: NAD, sitting up in chair HEENT: conjunctivae and lids normal,   CV: No cyanosis.  RESP: scattered rhonchi Neuro: moving all 4 Psych: calm.     Data Reviewed: I have personally reviewed labs and imaging studies    Steven KATHEE Ban, MD [facility] If 7PM-7AM, please contact night-coverage 09/23/2023, 2:29 PM

## 2023-09-24 DIAGNOSIS — S22080A Wedge compression fracture of T11-T12 vertebra, initial encounter for closed fracture: Secondary | ICD-10-CM | POA: Diagnosis not present

## 2023-09-24 MED ORDER — LACTULOSE 10 GM/15ML PO SOLN
30.0000 g | Freq: Once | ORAL | Status: DC
  Filled 2023-09-24: qty 60

## 2023-09-24 MED ORDER — GALANTAMINE HYDROBROMIDE 4 MG PO TABS
8.0000 mg | ORAL_TABLET | Freq: Two times a day (BID) | ORAL | Status: DC
  Administered 2023-09-24 – 2023-09-27 (×7): 8 mg via ORAL
  Filled 2023-09-24 (×8): qty 2

## 2023-09-24 NOTE — Progress Notes (Addendum)
  PROGRESS NOTE    Steven Elliott  FMW:[id number] DOB: [birth year] DOA: 09/08/2023 PCP: [facility]  140A/140A-AA  LOS: 15 days   Brief hospital course:   Assessment & Plan: 89 y.o. male with medical history significant for dementia, chronic kidney disease, COPD, hyponatremia prostate cancer with metastasis to the bone admitted with fall resulting in T11-12 fx.    Acute hypoxemic respiratory distress 2/2 Aspiration --significant mucus buildup in the upper airway, due to aspiration of fluid intake, own secretion, or acid reflux.  RT attempted nasal suctioning which removed some mucus, but pt couldn't tolerate more suctioning.  Transferred to stepdown for heated hf.  Now weaned to intermittent o2 --Nasal suctioning as tolerated --chest PT, 3% saline neb --dys 3 with nectar thick --completed course of abx (unasyn ) --IS and flutter valve --cont PPI BID  Hospital delirium Baseline dementia --cont seroquel  50 mg at bedtime - resides at home with daughter, has sitters  T11-12 fractures status post fall  -underlying ankylosing spondylitis Neurosurgery advised TLSO brace when out of bed, for 3 months --no tramadol , no morphine , per daughter --Norco PRN   Abnormal LFTs Total bili, direct/ indirect bili up. RUQ sono unremarkable.    COPD (chronic obstructive pulmonary disease) (HCC) On and off hypoxia noted. Encourage incentive spirometry, out of bed. May need home O2 qualification prior to dc. Duonebs as needed.   Dementia (HCC) At baseline. Gets confused and agitated on and off.  Avoid opiates, sedatives as he is high risk for confusion, falls Delirium precautions.   Anemia of chronic disease Stable H&H, monitor   Essential (primary) hypertension BP stable. --cont amlodipine  and Lopressor    Cerebral aneurysm, nonruptured CT head shows stable aneurysm   Prostate cancer metastatic to bone (HCC) --cont home Nubeqa   RLQ abdominal pain --described as  burning, but not worse with palpation.  CT a/p wo on presentation showed extensive small bowel pneumatosis, thought was present on the PET-CT dated 06/22/2023. --repeat a/p imaging per daughter's request with no additional acute finding - no recent complaints of abdominal pain    DVT prophylaxis: Lovenox  SQ Code Status: Full code  Family Communication: daughters clyda and donna updated telephonically 1/3 Level of care: Med-Surg Dispo:   The patient is from: home Anticipated d/c is to: SNF has bed at [facility] monday   Subjective and Interval History:  No complaints, denies pain, confused in bed, tolerated lunch   Objective: Vitals:   09/23/23 2157 09/23/23 2218 09/24/23 0459 09/24/23 0710  BP: (!) 146/91 (!) 161/65  130/60  Pulse: 78 78  89  Resp:  18  14  Temp:  (!) 97.4 F (36.3 C)  98.3 F (36.8 C)  TempSrc:    Oral  SpO2:  96%  93%  Weight:   76.4 kg   Height:        Intake/Output Summary (Last 24 hours) at 09/24/2023 1508 Last data filed at 09/24/2023 0448 Gross per 24 hour  Intake --  Output 600 ml  Net -600 ml    Filed Weights   09/21/23 0500 09/22/23 0500 09/24/23 0459  Weight: 74.4 kg 75.1 kg 76.4 kg    Examination:   Constitutional: NAD, sitting up in chair HEENT: atraumatic CV: No cyanosis.   RESP: scattered rhonchi Neuro: moving all 4 Psych: calm, confused   Data Reviewed: I have personally reviewed labs and imaging studies    [name], MD [facility] If 7PM-7AM, please contact night-coverage 09/24/2023, 3:08 PM

## 2023-09-24 NOTE — Plan of Care (Signed)

## 2023-09-24 NOTE — TOC Progression Note (Signed)
 Transition of Care [facility]) - Progression Note    Patient Details  Name: Steven Elliott MRN: [id number] Date of Birth: Feb 04, 1930  Transition of Care [facility]) CM/SW Contact  [name], RN Phone Number: 09/24/2023, 11:06 AM  Clinical Narrative:      Reached out to [facility] and asked if they would be able to accept the patient, they have declined   I called the patient's daughter [name]  I explained that [facility] has declined the bed offer   I reviewed the bed offers for a patient with her,  She will review them, she asked if I would send out to [facility]) I let her know I will expand the search to [facility]   Expected Discharge Plan and Services                                               Social Determinants of Health (SDOH) Interventions SDOH Screenings   Food Insecurity: No Food Insecurity (09/09/2023)  Housing: Low Risk  (09/09/2023)  Transportation Needs: No Transportation Needs (09/09/2023)  Utilities: Not At Risk (09/09/2023)  Social Connections: Patient Unable To Answer (09/21/2023)  Tobacco Use: Medium Risk (09/08/2023)    Readmission Risk Interventions    09/09/2023    1:41 PM  Readmission Risk Prevention Plan  Transportation Screening Complete  PCP or Specialist Appt within 3-5 Days Complete  HRI or Home Care Consult Complete  Social Work Consult for Recovery Care Planning/Counseling Complete  Palliative Care Screening Complete  Medication Review Oceanographer) Complete

## 2023-09-24 NOTE — Plan of Care (Signed)
  Problem: Education: Goal: Knowledge of General Education information will improve Description: Including pain rating scale, medication(s)/side effects and non-pharmacologic comfort measures Outcome: Progressing   Problem: Health Behavior/Discharge Planning: Goal: Ability to manage health-related needs will improve Outcome: Progressing   Problem: Clinical Measurements: Goal: Ability to maintain clinical measurements within normal limits will improve Outcome: Progressing   Problem: Pain Management: Goal: General experience of comfort will improve Outcome: Progressing

## 2023-09-24 NOTE — TOC Progression Note (Addendum)
 Transition of Care [facility]) - Progression Note    Patient Details  Name: Steven Elliott MRN: [id number] Date of Birth: May 27, 1930  Transition of Care [facility]) CM/SW Contact  [name], RN Phone Number: 09/24/2023, 1:47 PM  Clinical Narrative:     Daughter [name] called and stated that if [name] will offer a private room then they will accept the bed offer, I reached out to [facility] asking if they can provide A private room   [name] confirms that they will have a private room on Monday    Expected Discharge Plan and Services                                               Social Determinants of Health (SDOH) Interventions SDOH Screenings   Food Insecurity: No Food Insecurity (09/09/2023)  Housing: Low Risk  (09/09/2023)  Transportation Needs: No Transportation Needs (09/09/2023)  Utilities: Not At Risk (09/09/2023)  Social Connections: Patient Unable To Answer (09/21/2023)  Tobacco Use: Medium Risk (09/08/2023)    Readmission Risk Interventions    09/09/2023    1:41 PM  Readmission Risk Prevention Plan  Transportation Screening Complete  PCP or Specialist Appt within 3-5 Days Complete  HRI or Home Care Consult Complete  Social Work Consult for Recovery Care Planning/Counseling Complete  Palliative Care Screening Complete  Medication Review Oceanographer) Complete

## 2023-09-25 DIAGNOSIS — S22080A Wedge compression fracture of T11-T12 vertebra, initial encounter for closed fracture: Secondary | ICD-10-CM | POA: Diagnosis not present

## 2023-09-25 NOTE — Progress Notes (Signed)
  PROGRESS NOTE    Steven Elliott  FMW:[id number] DOB: [birth year] DOA: 09/08/2023 [facility]: [facility]  156A/156A-AA  LOS: 16 days   Brief hospital course:   Assessment & Plan: 89 y.o. male with medical history significant for dementia, chronic kidney disease, COPD, hyponatremia prostate cancer with metastasis to the bone admitted with fall resulting in T11-12 fx.    Acute hypoxemic respiratory distress 2/2 Aspiration --completed course of abx (unasyn ) --IS and flutter valve --cont PPI BID -now breathing comfortably  Hospital delirium Baseline dementia --cont seroquel  50 mg at bedtime - at baseline resides at home with daughter, has round-the-clock sitters  T11-12 fractures status post fall  -underlying ankylosing spondylitis Neurosurgery advised TLSO brace when out of bed, for 3 months --no tramadol , no morphine , per daughter --Norco PRN   Abnormal LFTs Total bili, direct/ indirect bili up. RUQ sono unremarkable.    COPD (chronic obstructive pulmonary disease) (HCC) On and off hypoxia noted. Encourage incentive spirometry, out of bed. Duonebs as needed.   Dementia (HCC) At baseline. Gets confused and agitated on and off.  Avoid opiates, sedatives as he is high risk for confusion, falls Delirium precautions.   Anemia of chronic disease Stable H&H, monitor   Essential (primary) hypertension BP stable. --cont amlodipine  and Lopressor    Cerebral aneurysm, nonruptured CT head shows stable aneurysm   Prostate cancer metastatic to bone (HCC) --cont home Nubeqa   RLQ abdominal pain CT a/p wo on presentation showed extensive small bowel pneumatosis, thought was present on the PET-CT dated 06/22/2023. --repeat a/p imaging per daughter's request with no additional acute finding - no recent complaints of abdominal pain    DVT prophylaxis: Lovenox  SQ Code Status: Full code  Family Communication: daughters clyda and donna updated telephonically  1/3 Level of care: Med-Surg Dispo:   The patient is from: home Anticipated d/c is to: SNF has bed at [facility] monday   Subjective and Interval History:  No complaints, confused   Objective: Vitals:   09/24/23 2332 09/25/23 0500 09/25/23 0821 09/25/23 1551  BP: 101/69  (!) 135/55 (!) 121/53  Pulse: 66  76 60  Resp: 18  17 17   Temp: 97.8 F (36.6 C)   97.6 F (36.4 C)  TempSrc: Oral   Oral  SpO2: 100%  91% 96%  Weight:  76 kg    Height:        Intake/Output Summary (Last 24 hours) at 09/25/2023 1632 Last data filed at 09/24/2023 2300 Gross per 24 hour  Intake 100 ml  Output --  Net 100 ml    Filed Weights   09/22/23 0500 09/24/23 0459 09/25/23 0500  Weight: 75.1 kg 76.4 kg 76 kg    Examination:   Constitutional: NAD, sitting up in chair HEENT: atraumatic CV: No cyanosis.   RESP: scattered rhonchi Neuro: moving all 4 Psych: calm, confused   Data Reviewed: I have personally reviewed labs and imaging studies    [name], MD [facility] If 7PM-7AM, please contact night-coverage 09/25/2023, 4:32 PM

## 2023-09-25 NOTE — Plan of Care (Signed)

## 2023-09-25 NOTE — Progress Notes (Signed)
 Physical Therapy Treatment Patient Details Name: Steven Elliott MRN: [id number] DOB: 08/23/1930 Today's Date: 09/25/2023   History of Present Illness 89 y.o. male with medical history significant for dementia, chronic kidney disease, COPD, hyponatremia prostate cancer with metastasis to the bone admitted with fall resulting in T11-12 fx. TLSO when OOB.  Respiratory concerns noted during hospitalization.    PT Comments  Pt was somewhat asleep upon arrival. He easily fully awakes however is only oriented to self. Pleasantly confused throughout session. PT session was greatly limited by pt's pain and anxiety with movement. He requires extensive assistance to safely achieve EOB short sit. Unable to tolerate sitting EOB for more than a minute prior to needing to return to supine. Pt did not sit EOB long enough to even apply TLSO. Pain and fatigue limiting. Author assisted pt with breakfast tray set up.  No family present to determine baseline cognition/abilities. Acute PT will continue o follow per current Poc. Dc recs remain appropriate.     If plan is discharge home, recommend the following: Two people to help with walking and/or transfers;A lot of help with bathing/dressing/bathroom;Assistance with cooking/housework;Assist for transportation;Help with stairs or ramp for entrance     Equipment Recommendations  Wheelchair (measurements PT);Wheelchair cushion (measurements PT)       Precautions / Restrictions Precautions Precautions: Fall Precaution Comments: s/p fall at home Required Braces or Orthoses: Spinal Brace Spinal Brace: Thoracolumbosacral orthotic;Applied in sitting position Restrictions Weight Bearing Restrictions Per Provider Order: No     Mobility  Bed Mobility Overal bed mobility: Needs Assistance Bed Mobility: Rolling, Sidelying to Sit, Sit to Sidelying Rolling: Max assist, Used rails Sidelying to sit: Max assist, Used rails     Sit to sidelying: Total assist General  bed mobility comments: Pt continues to require extensive assistance to acheive EOB sitting. Unable to tolerate even sitting EOIB due to pain. pt did not even sit EOB long enough to apply TLSO    Transfers  General transfer comment: Pt unable to tolerate EOB sitting due to pain       Balance Overall balance assessment: Needs assistance Sitting-balance support: Bilateral upper extremity supported, Feet supported Sitting balance-Leahy Scale: Fair Sitting balance - Comments: difficult to truely assess due to pt's limited tolerance to even sitting EOB. anxiety and pin limited     Cognition Arousal: Lethargic Behavior During Therapy: WFL for tasks assessed/performed Overall Cognitive Status: History of cognitive impairments - at baseline   General Comments: pt is oriented to self only. Increased time to perform desired task with pt unable to consistently follow commmands           General Comments General comments (skin integrity, edema, etc.): Encouraged pt to reposition mroe often and perform more ther ex even in bed. Pt required total assist to set breakfast tray up so he could independently feed himself      Pertinent Vitals/Pain Pain Assessment Pain Assessment: 0-10 Pain Score: 6  Pain Location: LBP Pain Descriptors / Indicators: Grimacing, Guarding, Aching, Sore Pain Intervention(s): Limited activity within patient's tolerance, Monitored during session, Premedicated before session, Repositioned     PT Goals (current goals can now be found in the care plan section) Acute Rehab PT Goals Patient Stated Goal: none stated Progress towards PT goals: Not progressing toward goals - comment    Frequency    Min 1X/week       AM-PAC PT 6 Clicks Mobility   Outcome Measure  Help needed turning from your back to your side  while in a flat bed without using bedrails?: A Lot Help needed moving from lying on your back to sitting on the side of a flat bed without using  bedrails?: A Lot Help needed moving to and from a bed to a chair (including a wheelchair)?: Total Help needed standing up from a chair using your arms (e.g., wheelchair or bedside chair)?: Total Help needed to walk in hospital room?: Total Help needed climbing 3-5 steps with a railing? : Total 6 Click Score: 8    End of Session   Activity Tolerance: Patient limited by fatigue;Patient limited by lethargy;Patient limited by pain Patient left: in bed;with call bell/phone within reach;with bed alarm set Nurse Communication: Mobility status;Other (comment) PT Visit Diagnosis: Difficulty in walking, not elsewhere classified (R26.2);Other abnormalities of gait and mobility (R26.89);Muscle weakness (generalized) (M62.81);History of falling (Z91.81);Pain Pain - Right/Left: Left Pain - part of body: Leg     Time: 9081-9066 PT Time Calculation (min) (ACUTE ONLY): 15 min  Charges:    $Therapeutic Activity: 8-22 mins PT General Charges $$ ACUTE PT VISIT: 1 Visit                     Rankin Essex PTA 09/25/23, 1:31 PM

## 2023-09-25 NOTE — Plan of Care (Signed)
  Problem: Clinical Measurements: Goal: Ability to maintain clinical measurements within normal limits will improve Outcome: Progressing Goal: Will remain free from infection Outcome: Progressing Goal: Diagnostic test results will improve Outcome: Progressing Goal: Respiratory complications will improve Outcome: Progressing Goal: Cardiovascular complication will be avoided Outcome: Progressing   Problem: Nutrition: Goal: Adequate nutrition will be maintained Outcome: Progressing   Problem: Pain Management: Goal: General experience of comfort will improve Outcome: Progressing   Problem: Safety: Goal: Ability to remain free from injury will improve Outcome: Progressing   Problem: Skin Integrity: Goal: Risk for impaired skin integrity will decrease Outcome: Progressing

## 2023-09-26 DIAGNOSIS — S22080A Wedge compression fracture of T11-T12 vertebra, initial encounter for closed fracture: Secondary | ICD-10-CM | POA: Diagnosis not present

## 2023-09-26 LAB — CBC
HCT: 35.5 % — ABNORMAL LOW (ref 39.0–52.0)
Hemoglobin: 11.6 g/dL — ABNORMAL LOW (ref 13.0–17.0)
MCH: 31.7 pg (ref 26.0–34.0)
MCHC: 32.7 g/dL (ref 30.0–36.0)
MCV: 97 fL (ref 80.0–100.0)
Platelets: 214 10*3/uL (ref 150–400)
RBC: 3.66 MIL/uL — ABNORMAL LOW (ref 4.22–5.81)
RDW: 14.2 % (ref 11.5–15.5)
WBC: 11.9 10*3/uL — ABNORMAL HIGH (ref 4.0–10.5)
nRBC: 0 % (ref 0.0–0.2)

## 2023-09-26 LAB — BASIC METABOLIC PANEL
Anion gap: 13 (ref 5–15)
BUN: 22 mg/dL (ref 8–23)
CO2: 23 mmol/L (ref 22–32)
Calcium: 7.8 mg/dL — ABNORMAL LOW (ref 8.9–10.3)
Chloride: 106 mmol/L (ref 98–111)
Creatinine, Ser: 0.72 mg/dL (ref 0.61–1.24)
GFR, Estimated: >60 mL/min (ref >60)
Glucose, Bld: 85 mg/dL (ref 70–99)
Potassium: 3.4 mmol/L — ABNORMAL LOW (ref 3.5–5.1)
Sodium: 142 mmol/L (ref 135–145)

## 2023-09-26 NOTE — Progress Notes (Signed)
  PROGRESS NOTE    Steven Elliott  FMW:[id number] DOB: [birth year] DOA: 09/08/2023 [facility]: [facility]  156A/156A-AA  LOS: 17 days   Brief hospital course:   Assessment & Plan: 89 y.o. male with medical history significant for dementia, chronic kidney disease, COPD, hyponatremia prostate cancer with metastasis to the bone admitted with fall resulting in T11-12 fx.    Acute hypoxemic respiratory distress 2/2 Aspiration --completed course of abx (unasyn ) --IS and flutter valve --cont PPI BID -now breathing comfortably  Hospital delirium Baseline dementia - at baseline resides at home with daughter, has round-the-clock sitters  Rash New today macular rash on torso and legs, etiology unclear - will stop seroquel , amlodipine , pantop, metop  -- all new meds here in the hospital - may need outpt derm f/u if fails to resolve  T11-12 fractures status post fall  -underlying ankylosing spondylitis Neurosurgery advised TLSO brace when out of bed, for 3 months --no tramadol , no morphine , per daughter --Norco PRN   Abnormal LFTs Total bili, direct/ indirect bili up. RUQ sono unremarkable.    COPD (chronic obstructive pulmonary disease) (HCC) On and off hypoxia noted. Encourage incentive spirometry, out of bed. Duonebs as needed.   Dementia (HCC) At baseline. Gets confused and agitated on and off.  Avoid opiates, sedatives as he is high risk for confusion, falls Delirium precautions.   Anemia of chronic disease Stable H&H, monitor   Essential (primary) hypertension BP stable. --cont amlodipine  and Lopressor    Cerebral aneurysm, nonruptured CT head shows stable aneurysm   Prostate cancer metastatic to bone (HCC) --cont home Nubeqa   RLQ abdominal pain CT a/p wo on presentation showed extensive small bowel pneumatosis, thought was present on the PET-CT dated 06/22/2023. --repeat a/p imaging per daughter's request with no additional acute finding - no  recent complaints of abdominal pain    DVT prophylaxis: Lovenox  SQ Code Status: Full code  Family Communication: daughters clyda and donna updated telephonically 1/3 Level of care: Med-Surg Dispo:   The patient is from: home Anticipated d/c is to: SNF has bed [facility] monday   Subjective and Interval History:  No complaints, confused   Objective: Vitals:   09/25/23 2226 09/25/23 2315 09/26/23 0837 09/26/23 1518  BP: (!) 148/65 (!) 132/49 119/64 (!) 141/57  Pulse: 75 73 74 65  Resp:  20 18 19   Temp:  (!) 97.4 F (36.3 C) 97.8 F (36.6 C) 97.7 F (36.5 C)  TempSrc:  Oral    SpO2:  94% 97% 95%  Weight:      Height:        Intake/Output Summary (Last 24 hours) at 09/26/2023 1553 Last data filed at 09/26/2023 0857 Gross per 24 hour  Intake 120 ml  Output --  Net 120 ml    Filed Weights   09/22/23 0500 09/24/23 0459 09/25/23 0500  Weight: 75.1 kg 76.4 kg 76 kg    Examination:   Constitutional: NAD, sitting up in chair HEENT: atraumatic CV: No cyanosis.   RESP: scattered rhonchi Neuro: moving all 4 Psych: calm, confused Skin: on torso and upper legs blanching erythematous macules   Data Reviewed: I have personally reviewed labs and imaging studies    [name], MD [facility] If 7PM-7AM, please contact night-coverage 09/26/2023, 3:53 PM

## 2023-09-27 DIAGNOSIS — S22080A Wedge compression fracture of T11-T12 vertebra, initial encounter for closed fracture: Secondary | ICD-10-CM | POA: Diagnosis not present

## 2023-09-27 NOTE — Progress Notes (Addendum)
 Speech Language Pathology Treatment: Dysphagia  Patient Details Name: Steven Elliott MRN: [id number] DOB: [birth year] Today's Date: 09/27/2023 Time: 1110-1140 SLP Time Calculation (min) (ACUTE ONLY): 30 min  Assessment / Plan / Recommendation Clinical Impression  Pt seen for ongoing assessment of swallowing. He is alert, pleasant, verbally responsive and engaged in conversation w/ SLP; min verbal cues to redirect attention to task. Pt stated he was hot and had taken his gown off. On RA; afebrile. Dentures in place. Discussed/reviewed the Handout on general aspiration precautions together w/ pt. He agreed verbally to the need for following them especially sitting upright for all oral intake -- he laughed and stated I don't want to choke. He helped to support himself upright but more support behind the back given for full upright sitting. Pt required assistance w/ positioning d/t weakness. He fed himself trials of Nectar liquids a cup/straw. No overt clinical s/s of aspiration were noted w/ trials; respiratory status remained calm and unlabored, vocal quality clear b/t trials, no cough. Oral phase appeared [facility] for bolus management and timely A-P transfer for swallowing; oral clearing achieved w/ trials.  Met w/ pt/Family briefly yesterday(09/26/23) to review general aspiration precautions/gave Handout; also discussed the recommendation to continue w/ current Dysphagia diet for safety of swallowing in setting of comorbidities(illness, Dementia, advanced age) until he can transition to the SNF for Rehab. Family agreed w/ POC.    Pt appears at reduced risk for aspriation when following general aspiration precautions w/ a Dysphagia diet w/ Nectar liquids. Recommend continue the Mech soft diet for ease of soft foods (for mastication) w/ gravies added to moisten foods; Nectar liquids. Recommend general aspiration precautions; Pills Whole vs Crushed in Puree; tray setup and positioning assistance for meals.  Feeding support as needed- pt to hold cup when drinking. ST services will f/u at SNF- pt is d/t d/c there today per chart notes. Precautions posted at bedside.       HPI HPI: Pt is a 89 y.o. male with medical history significant for Dementia, chronic kidney disease, COPD, hyponatremia prostate cancer with Metastasis to the bone admitted with fall resulting in T11-12 fx. Neurosurgery recommending TLSO brace and conservative care. CT Abdomen Pelvis revealed 1. 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 In retrospect this was present on the PET-CT dated 06/22/2023. Findings may be related to factors such as benign small bowel pneumatosis, which may be related to factors such as chronic mechanical stress (constipation), subclinical mesenteric ischemia, vascular changes from AAA repair, or radiation effects (if applicable). Advise close clinical monitoring and follow-up imaging (CT mesenteric angiogram) if there are clinical concerns for  mesenteric ischemia 2. Within the left upper quadrant of the abdomen there are several loops of small bowel which are increased in caliber measuring up to  3.7 cm with normal caliber mid and distal small bowel loops. Findings are favored to represent ileus. 3. Status post stent graft repair of abdominal aortic aneurysm. Aneurysm sac measures 4.5 x 3.6 cm. Unchanged from 06/22/2023. 4. 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 Rigid spine fracture across the T11-T12 level as described on lumbar spine CT from 09/08/23. 5. 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 6. Aortic Atherosclerosis and Emphysema.  OF NOTE: per discussion w/ Family, Pt is currently on HFNC O2 support 35L; FiO2 40%. Respiratory status is much improved but still decline for breath support for speech -- only  able to speak ~2-3 words consecutively.   Daughters present and endorsed  (along w/ pt report) s/s of REFLUX at Baseline. They explained that pt had had acid reflux in recent days; pt stated my stomach went pow; the acid burned my me; I have a lot of phlegm. Pt is not on a PPI currently per chart orders.  CT Angio and CXR Imaging on 09/14/2023: Per CT Angio/Abd and CXR yesterday: Lower chest- Emphysematous changes and fibrosis in the lungs. No focal  consolidation. Small pleural effusions, improved on the left since prior study. Dependent atelectasis in volume  loss noted within the lung bases.  Pt has been NPO since 09/14/2023 when he had a Rapid Response status and transferred to CCU level care.      SLP Plan  All goals met (in Acute setting)      Recommendations for follow up therapy are one component of a multi-disciplinary discharge planning process, led by the attending physician.  Recommendations may be updated based on patient status, additional functional criteria and insurance authorization.    Recommendations  Diet recommendations: Dysphagia 3 (mechanical soft);Nectar-thick liquid Liquids provided via: Cup;Straw (monitor) Medication Administration: Whole meds with puree (vs Crushed in puree) Supervision: Patient able to self feed;Staff to assist with self feeding;Full supervision/cueing for compensatory strategies Compensations: Minimize environmental distractions;Slow rate;Lingual sweep for clearance of pocketing;Small sips/bites;Follow solids with liquid Postural Changes and/or Swallow Maneuvers: Out of bed for meals;Seated upright 90 degrees;Upright 30-60 min after meal                 (Palliative Care f/u; dietician f/u) Oral care BID;Oral care before and after PO;Staff/trained caregiver to provide oral care (denture care)   Frequent or constant Supervision/Assistance (w/ meals) Dysphagia, pharyngeal phase (R13.13)     All goals met (in Acute setting)       Comer Portugal, MS, CCC-SLP Speech Language Pathologist Rehab  Services; [facility] [place] [phone] (ascom) Shadow Stiggers  09/27/2023, 12:14 PM

## 2023-09-27 NOTE — Discharge Summary (Addendum)
 Steven Elliott FMW:[id number] DOB: 29-Apr-1930 DOA: 09/08/2023  PCP: [facility]  Admit date: 09/08/2023 Discharge date: 09/27/2023  Time spent: 35 minutes  Recommendations for Outpatient Follow-up:  Pcp f/u  Consider dermatology f/u if rash persists    Discharge Diagnoses:  Principal Problem:   Closed wedge compression fracture of T11 vertebra (HCC) Active Problems:   Fall   Orthostatic hypotension   Abnormal LFTs   COPD (chronic obstructive pulmonary disease) (HCC)   Chronic kidney disease, stage 3a (HCC)   Dementia (HCC)   Prostate cancer metastatic to bone (HCC)   Cerebral aneurysm, nonruptured   Essential (primary) hypertension   Oropharyngeal dysphagia   Anemia   Fracture dislocation of thoracic spine, closed, initial encounter (HCC)   Compression fracture of T12 vertebra (HCC)   Discharge Condition: stable  Diet recommendation: dysphagia 3 nectar thick  Filed Weights   09/24/23 0459 09/25/23 0500 09/27/23 0500  Weight: 76.4 kg 76 kg 70.3 kg    History of present illness:  Steven Elliott is a 89 y.o. male with medical history significant for dementia, chronic kidney disease, COPD, hyponatremia prostate cancer with metastasis to the bone presents to the emergency department for evaluation of back pain, since his fall.   Comes via EMS for back pain that is worse since his fall last week on August 29, 2023 when he fell backwards.  Patient is having difficulty ambulating due to the pain.  No reports of incontinence of bowel or bladder or bandlike pain.  In the ED patient needed assistance from 2 people to get out of his recliner.  X-ray showed patient has a compression fracture that is acute in the thoracic spine.  Neurosurgery contacted who recommended pain control patient for physical therapy as tolerated, admission for back pain.  Hospital Course:   T11-12 fractures status post fall  -underlying ankylosing spondylitis Neurosurgery advised  TLSO brace when out of bed, for 3 months No longer requiring pain meds To snf  Acute hypoxemic respiratory distress 2/2 Aspiration --completed course of abx (unasyn ) --IS and flutter valve --cont PPI BID -now breathing comfortably on room air   Hospital delirium Baseline dementia - at baseline resides at home with daughter, has round-the-clock sitters   Rash New macular rash on torso and legs, possibly 2/2 hydrocodone  as received that here inadvertently, it's on allergy list as causing rash - derm f/u if rash fails to resolve  Abnormal LFTs Resolved, unremarkable ruq u/s   COPD (chronic obstructive pulmonary disease) (HCC) Stable at time of d/c   Dementia (HCC) At baseline. Gets confused and agitated on and off.  Avoid opiates, sedatives as he is high risk for confusion, falls Delirium precautions.   Anemia of chronic disease Stable H&H, monitor   Essential (primary) hypertension BP stable.   Cerebral aneurysm, nonruptured CT head shows stable aneurysm   Prostate cancer metastatic to bone (HCC) --cont home Nubeqa    RLQ abdominal pain CT a/p wo on presentation showed extensive small bowel pneumatosis, thought was present on the PET-CT dated 06/22/2023. --repeat a/p imaging per daughter's request with no additional acute finding - no recent complaints of abdominal pain, stooling normally  Procedures: none   Consultations: neurosurgery  Discharge Exam: Vitals:   09/26/23 2342 09/27/23 0916  BP: (!) 157/50 (!) 175/77  Pulse: 77 86  Resp: 19 17  Temp: 98.2 F (36.8 C) 97.9 F (36.6 C)  SpO2: 92% 90%    Constitutional: NAD, sitting up in chair HEENT: atraumatic CV: No  cyanosis.   RESP: scattered rhonchi Neuro: moving all 4 Psych: calm, confused Skin: on torso and upper legs blanching erythematous macules, improving  Discharge Instructions   Discharge Instructions     Diet - low sodium heart healthy   Complete by: As directed    Discharge  wound care:   Complete by: As directed    Standard heel pressure ulcer care   Increase activity slowly   Complete by: As directed       Allergies as of 09/27/2023       Reactions   Procaine Anaphylaxis   Ciprofloxacin Other (See Comments), Nausea And Vomiting   Malaise, Rigor   Hydrocodone     Other reaction(s): Rash, Hives, Heart palpitations ()   Hydrocodone -acetaminophen  Itching   Watery eyes   Lisinopril Other (See Comments)   Omeprazole Other (See Comments)   GI reaction   Penicillins Other (See Comments)   Rabeprazole Other (See Comments)   GI reaction        Medication List     STOP taking these medications    oxyCODONE  5 MG immediate release tablet Commonly known as: Oxy IR/ROXICODONE    traMADol  50 MG tablet Commonly known as: Ultram        TAKE these medications    acetaminophen  500 MG tablet Commonly known as: TYLENOL  Take 500 mg by mouth every 6 (six) hours as needed.   albuterol  108 (90 Base) MCG/ACT inhaler Commonly known as: VENTOLIN  HFA Inhale 2 puffs into the lungs every 6 (six) hours as needed for wheezing or shortness of breath.   Calcium  Carb-Cholecalciferol  600-10 MG-MCG Tabs Take 1 tablet by mouth 2 (two) times daily.   Calcium -D 600-400 MG-UNIT Tabs Take 1 tablet by mouth 2 (two) times daily.   cetirizine  5 MG tablet Commonly known as: ZYRTEC  Take 1 tablet (5 mg total) by mouth daily.   clotrimazole  1 % cream Commonly known as: LOTRIMIN  Apply 1 Application topically 2 (two) times daily.   cyanocobalamin  500 MCG tablet Commonly known as: VITAMIN B12 Take 1 tablet by mouth daily.   cyclobenzaprine  5 MG tablet Commonly known as: FLEXERIL  Take 5 mg by mouth 3 (three) times daily as needed for muscle spasms.   darolutamide  300 MG tablet Commonly known as: NUBEQA  Take 600 mg by mouth 2 (two) times daily with a meal.   DULoxetine  20 MG capsule Commonly known as: CYMBALTA  Take 20 mg by mouth 2 (two) times daily.   famotidine   20 MG tablet Commonly known as: PEPCID  Take 1 tablet (20 mg total) by mouth at bedtime.   feeding supplement Liqd Take 237 mLs by mouth 3 (three) times daily between meals.   fluticasone  50 MCG/ACT nasal spray Commonly known as: FLONASE  Place 1 spray into both nostrils daily.   gabapentin  100 MG capsule Commonly known as: NEURONTIN  Take 1 capsule (100 mg total) by mouth at bedtime.   galantamine  16 MG 24 hr capsule Commonly known as: RAZADYNE  ER Take 1 capsule (16 mg total) by mouth daily with breakfast.   ipratropium 0.06 % nasal spray Commonly known as: ATROVENT  Place 2 sprays into both nostrils 2 (two) times daily.   magnesium  oxide 400 (240 Mg) MG tablet Commonly known as: MAG-OX Take 400 mg by mouth daily.   memantine  10 MG tablet Commonly known as: NAMENDA  Take 10 mg by mouth 2 (two) times daily.   multivitamin with minerals Tabs tablet Take 1 tablet by mouth daily.   olopatadine 0.1 % ophthalmic solution Commonly known as: PATANOL  Place 1 drop into both eyes 2 (two) times daily.   tamsulosin  0.4 MG Caps capsule Commonly known as: FLOMAX  Take 0.4 mg by mouth at bedtime.   traZODone  50 MG tablet Commonly known as: DESYREL  Take 1 tablet (50 mg total) by mouth at bedtime.               Discharge Care Instructions  (From admission, onward)           Start     Ordered   09/27/23 0000  Discharge wound care:       Comments: Standard heel pressure ulcer care   09/27/23 1146           Allergies  Allergen Reactions   Procaine Anaphylaxis   Ciprofloxacin Other (See Comments) and Nausea And Vomiting    Malaise, Rigor   Hydrocodone      Other reaction(s): Rash, Hives, Heart palpitations ()   Hydrocodone -Acetaminophen  Itching    Watery eyes   Lisinopril Other (See Comments)   Omeprazole Other (See Comments)    GI reaction   Penicillins Other (See Comments)   Rabeprazole Other (See Comments)    GI reaction    Follow-up Information      [facility] Follow up.   Why: Hospital follow up Contact information: [address] [facility] [place] [address] [phone]                  The results of significant diagnostics from this hospitalization (including imaging, microbiology, ancillary and laboratory) are listed below for reference.    Significant Diagnostic Studies: CT Angio Abd/Pel w/ and/or w/o Result Date: 09/14/2023 CLINICAL DATA:  Small bowel pneumatosis, right-sided abdominal pain EXAM: CTA ABDOMEN AND PELVIS WITHOUT AND WITH CONTRAST TECHNIQUE: Multidetector CT imaging of the abdomen and pelvis was performed using the standard protocol during bolus administration of intravenous contrast. Multiplanar reconstructed images and MIPs were obtained and reviewed to evaluate the vascular anatomy. RADIATION DOSE REDUCTION: This exam was performed according to the departmental dose-optimization program which includes automated exposure control, adjustment of the mA and/or kV according to patient size and/or use of iterative reconstruction technique. CONTRAST:  OMNIPAQUE  IOHEXOL  350 MG/ML SOLN COMPARISON:  09/09/2023 FINDINGS: VASCULAR Aorta: Moderate calcified atheromatous plaque. Patent bifurcated infrarenal stent graft. Native sac diameter 4.6 cm, previously 4.5. No definite endoleak. No evidence of leak or impending rupture. Celiac: Calcified ostial plaque resulting in short segment stenosis of at least mild severity, with patent trifurcation anatomy. SMA: Calcified ostial plaque resulting in mild stenosis, patent distally with classic branch anatomy. Renals: Single left with a broad atheromatous ostium, without high-grade stenosis. Duplicated right, posterior dominant, with heavily calcified ostial plaque resulting in short segment stenosis of at least moderate severity, patent distally. IMA: Reconstituted beyond its origin by visceral collaterals. Inflow: Limbs of the stent graft extends to  distal common iliac arteries, well apposed. Moderate scattered calcified plaque without aneurysm or high-grade stenosis. Proximal Outflow: Moderately atheromatous, patent. Veins: Patent hepatic veins, portal vein, SM V, IMV, splenic vein, bilateral renal veins. Iliac venous system and IVC unremarkable. Review of the MIP images confirms the above findings. NON-VASCULAR Lower chest: Small pleural effusions, improved on the left since prior study. emphysematous changes in the lung bases. Hepatobiliary: Cholecystectomy clips. No intrahepatic biliary ductal dilatation. No portal venous gas or or pneumobilia. Ectatic CBD as before. No new liver lesion. Pancreas: Unremarkable. No pancreatic ductal dilatation or surrounding inflammatory changes. Spleen: Normal in size without focal abnormality. Adrenals/Urinary Tract: Stable  left adrenal fullness. Symmetric renal enhancement without hydronephrosis. Urinary bladder nondistended. Stomach/Bowel: Stomach is decompressed, without acute finding. Proximal duodenal diverticulum. Small bowel is decompressed. No convincing bowel wall thickening. The colon is partially distended, with innumerable distal descending and sigmoid diverticula; no adjacent inflammatory change. Scattered gas within the leaves of the peripheral mesentery as before. Lymphatic: No abdominal or pelvic adenopathy. Reproductive: Prostate enlargement. Other: No ascites.  No definite free air. Musculoskeletal: Left inguinal hernia involving the wall of the distal descending colon without obstruction or strangulation. Right inguinal hernia containing only fat. Worsening unhealed compression deformity at the inferior endplate of T11, with only minimal retropulsion spurring throughout the lumbar spine. Bilateral hip DJD. IMPRESSION: 1. Moderate gas in the peripheral mesentery as before but no additional evidence of acute bowel ischemia or other acute findings. 2. Worsening thoracic T11 vertebral compression fracture.  3. Patent bifurcated infrarenal aortic stent graft with native sac diameter 4.6 cm, previously 4.5 cm. No definite endoleak. 4. Small pleural effusions, improved on the left since prior study. 5. Left inguinal hernia involving the wall of the distal descending colon without obstruction or strangulation. 6.  Aortic Atherosclerosis (ICD10-I70.0). Electronically Signed   By: JONETTA Faes M.D.   On: 09/14/2023 10:57   DG Chest Port 1 View Result Date: 09/13/2023 CLINICAL DATA:  Dyspnea and respiratory abnormalities. EXAM: PORTABLE CHEST 1 VIEW COMPARISON:  09/10/2023 FINDINGS: Heart size and pulmonary vascularity are normal. Emphysematous changes in the lungs. Peribronchial thickening with interstitial changes and probable bronchiectasis likely representing chronic bronchitis and fibrosis. No airspace consolidation. Calcified granulomas in the upper lungs. No pleural effusions. No pneumothorax. Calcification of the aorta. Degenerative changes in the spine and shoulders. IMPRESSION: Emphysematous changes and fibrosis in the lungs. No focal consolidation. Similar appearance to previous study. Electronically Signed   By: Elsie Gravely M.D.   On: 09/13/2023 23:55   US  Abdomen Limited RUQ (LIVER/GB) Result Date: 09/11/2023 CLINICAL DATA:  History of cholecystectomy presenting with right upper quadrant pain. EXAM: ULTRASOUND ABDOMEN LIMITED RIGHT UPPER QUADRANT COMPARISON:  None Available. FINDINGS: Gallbladder: The gallbladder is surgically absent. Common bile duct: Diameter: 3.3 mm Liver: No focal lesion identified. Diffusely increased echogenicity of the liver parenchyma is noted. Portal vein is patent on color Doppler imaging with normal direction of blood flow towards the liver. Other: Limited study secondary to overlying bowel gas as well as the inability of the patient to hold his breath for optimal imaging. IMPRESSION: 1. Findings consistent with history of prior cholecystectomy. 2. Hepatic steatosis without  focal liver lesions. Electronically Signed   By: Suzen Dials M.D.   On: 09/11/2023 18:21   DG Chest 1 View Result Date: 09/10/2023 CLINICAL DATA:  Hypoxia. EXAM: CHEST  1 VIEW COMPARISON:  Chest radiograph dated 05/22/2022 and CT dated 09/09/2023. FINDINGS: Background of emphysema. There are bibasilar atelectasis/scarring. No focal consolidation, pleural effusion, pneumothorax. Dilatation of the pulmonary arteries suggestive of pulmonary hypertension. Atherosclerotic calcification of the aorta. No acute osseous pathology. IMPRESSION: 1. No active disease. 2. Emphysema and findings suggestive of pulmonary hypertension. Electronically Signed   By: Vanetta Chou M.D.   On: 09/10/2023 20:08   CT ABDOMEN PELVIS WO CONTRAST Addendum Date: 09/09/2023 ADDENDUM REPORT: 09/09/2023 11:09 ADDENDUM: Critical Value/emergent results were called by telephone at the time of interpretation on 09/09/2023 at 11:09 am to provider Cresencio Fairly, MD, who verbally acknowledged these results. Electronically Signed   By: Waddell Calk M.D.   On: 09/09/2023 11:09   Result Date: 09/09/2023 CLINICAL  DATA:  Abdominal trauma. Status post fall. Pneumatosis identified on CT. History of prostate cancer. EXAM: CT ABDOMEN AND PELVIS WITHOUT CONTRAST TECHNIQUE: Multidetector CT imaging of the abdomen and pelvis was performed following the standard protocol without IV contrast. RADIATION DOSE REDUCTION: This exam was performed according to the departmental dose-optimization program which includes automated exposure control, adjustment of the mA and/or kV according to patient size and/or use of iterative reconstruction technique. COMPARISON:  PET-CT 06/22/2023 FINDINGS: Lower chest: Emphysematous changes. Dependent atelectasis in volume loss noted within the lung bases. Hepatobiliary: Low-density structure within segment 3 measures 1 cm. This is unchanged from 04/23/2022 and is favored to represent a benign abnormality. Status post  cholecystectomy. Common bile duct measures 1 cm and is unchanged from 05/10/2022. In the absence of clinical signs/symptoms of biliary obstruction this most likely reflects post cholecystectomy physiology Pancreas: Unremarkable. No pancreatic ductal dilatation or surrounding inflammatory changes. Spleen: Normal in size without focal abnormality. Adrenals/Urinary Tract: Normal adrenal glands. No signs of obstructive uropathy. No kidney mass identified. Cyst off the lateral cortex of left kidney measures 1 cm, image 34/8. No specific follow-up recommendations. Bladder wall trabeculation with multiple intramural diverticula identified are favored to represent sequelae of chronic bladder outlet obstruction. Stomach/Bowel: The assessment of bowel pathology is diminished due to lack of IV and enteric contrast material. Stomach appears nondistended. Within the left upper quadrant of the abdomen there are several loops of small bowel which are increased in caliber measuring up to 3.7 cm. Normal caliber mid and distal small bowel loops. No abnormal colonic dilatation. Colonic diverticulosis is most severe at the level of the sigmoid colon. There is no sign of acute diverticulitis. 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 In retrospect this was present on the PET-CT dated 06/22/2023. No sign portal venous gas. There is no abnormal small bowel wall thickening or surrounding inflammatory fat stranding. Vascular/Lymphatic: Extensive aortic atherosclerosis. Status post stent graft repair abdominal aortic aneurysm. Aneurysm sac measures 4.5 x 3.6 cm, image 38/3. Unchanged from 06/22/2023. No enlarged abdominopelvic lymph nodes. Reproductive: Unchanged appearance of the prostate gland compared with 06/22/2023. Other: No convincing evidence for pneumoperitoneum. No significant free fluid or fluid collections. Large bilateral fat containing inguinal hernias. Musculoskeletal: Signs of  ankylosing spondylitis with diffuse ankylosis of the thoracic and upper lumbar spine. Rigid spine fracture across the T11-T12 level as described on lumbar spine CT from 09/08/23. 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 IMPRESSION: 1. 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 In retrospect this was present on the PET-CT dated 06/22/2023. There is no abnormal small bowel wall thickening or surrounding inflammatory fat stranding. No sign of portal venous gas. Absence of significant findings like bowel wall thickening, inflammation, free fluid, and pneumoperitoneum suggests that the pneumatosis is not associated with an acute or severe pathological process like acute ischemia, perforation, or infection. Findings may be related to factors such as benign small bowel pneumatosis, which may be related to factors such as chronic mechanical stress (constipation), subclinical mesenteric ischemia, vascular changes from AAA repair, or radiation effects (if applicable). Advise close clinical monitoring and follow-up imaging (CT mesenteric angiogram) if there are clinical concerns for mesenteric ischemia 2. Within the left upper quadrant of the abdomen there are several loops of small bowel which are increased in caliber measuring up to 3.7 cm with normal caliber mid and distal small bowel loops. Findings are  favored to represent ileus. 3. Status post stent graft repair of abdominal aortic aneurysm. Aneurysm sac measures 4.5 x 3.6 cm. Unchanged from 06/22/2023. 4. 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 Rigid spine fracture across the T11-T12 level as described on lumbar spine CT from 09/08/23. 5. 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 6. Aortic Atherosclerosis (ICD10-I70.0) and Emphysema (ICD10-J43.9). Electronically Signed: By: Waddell Calk M.D. On:  09/09/2023 08:48   CT Angio Chest Pulmonary Embolism (PE) W or WO Contrast Result Date: 09/09/2023 CLINICAL DATA:  Syncope/presyncope with cerebrovascular cause suspected. EXAM: CT ANGIOGRAPHY CHEST WITH CONTRAST TECHNIQUE: Multidetector CT imaging of the chest was performed using the standard protocol during bolus administration of intravenous contrast. Multiplanar CT image reconstructions and MIPs were obtained to evaluate the vascular anatomy. RADIATION DOSE REDUCTION: This exam was performed according to the departmental dose-optimization program which includes automated exposure control, adjustment of the mA and/or kV according to patient size and/or use of iterative reconstruction technique. CONTRAST:  75mL OMNIPAQUE  IOHEXOL  350 MG/ML SOLN COMPARISON:  None Available. FINDINGS: Cardiovascular: Satisfactory opacification of the pulmonary arteries to the segmental level. No evidence of pulmonary embolism. Normal heart size. No pericardial effusion.Atheromatous calcification of the aorta and coronaries. Mediastinum/Nodes: No mass or adenopathy. Lungs/Pleura: Centrilobular and panlobular emphysema. Dependent atelectasis with subpleural opacification and volume loss. Upper Abdomen: Extensive pneumatosis around epigastric bowel loops Musculoskeletal: Ankylosing spondylitis with diffuse ankylosis. Rigid spine fracture across the T11-12 level following the disc space, reference dedicated imaging from yesterday. No interval displacement Review of the MIP images confirms the above findings. Critical Value/emergent results were called by telephone at the time of interpretation on 09/09/2023 at 5:25 am to provider Jesus, who verbally acknowledged these results. IMPRESSION: 1. Extensive pneumatosis affecting epigastric bowel loops, partial coverage with cause not clearly identified, recommend abdominal exam/dedicated imaging. 2. Negative for pulmonary embolism.  Dependent atelectasis. 3. Known rigid spine fracture  at T11-12 without interval displacement. Electronically Signed   By: Dorn Roulette M.D.   On: 09/09/2023 05:26   CT HEAD WO CONTRAST ( ) Result Date: 09/09/2023 CLINICAL DATA:  Syncope/presyncope with cerebrovascular cause suspected EXAM: CT HEAD WITHOUT CONTRAST TECHNIQUE: Contiguous axial images were obtained from the base of the skull through the vertex without intravenous contrast. RADIATION DOSE REDUCTION: This exam was performed according to the departmental dose-optimization program which includes automated exposure control, adjustment of the mA and/or kV according to patient size and/or use of iterative reconstruction technique. COMPARISON:  04/18/2023 FINDINGS: Brain: No evidence of acute infarction, hemorrhage, hydrocephalus, extra-axial collection or mass lesion/mass effect. Generalized atrophy. Vascular: Right MCA bifurcation aneurysm with heavy mural calcification, ~ 1 cm on sagittal reformats. Skull: Normal. Negative for fracture or focal lesion. Sinuses/Orbits: No acute finding. IMPRESSION: 1. No acute finding. 2. Atrophy, atherosclerosis, and long-standing right MCA aneurysm (seen since at least 2005). Electronically Signed   By: Dorn Roulette M.D.   On: 09/09/2023 04:35   CT Lumbar Spine Wo Contrast Result Date: 09/08/2023 CLINICAL DATA:  Back trauma, no prior imaging (Age >= 16y). Low back pain since a fall 10 days ago. History of prostate cancer. EXAM: CT LUMBAR SPINE WITHOUT CONTRAST TECHNIQUE: Multidetector CT imaging of the lumbar spine was performed without intravenous contrast administration. Multiplanar CT image reconstructions were also generated. RADIATION DOSE REDUCTION: This exam was performed according to the departmental dose-optimization program which includes automated exposure control, adjustment of the mA and/or kV according to patient size  and/or use of iterative reconstruction technique. COMPARISON:  Lumbar spine radiographs 08/29/2023. CT abdomen and pelvis  05/20/2022. PET-CT 06/22/2023. FINDINGS: Segmentation: 5 lumbar type vertebrae. Alignment: Mild-to-moderate thoracolumbar dextroscoliosis. Trace anterolisthesis of L4 on L5. Vertebrae: Diffuse osteopenia. Acute fractures involving the T11 inferior and T12 superior endplates and vertebral syndesmophytes with at most minimal displacement and no definitive posterior element fracture. Interbody ankylosis from T9-T11 and T12-L2. Partial ankylosis of the SI joints. Sclerotic lesions in the posteromedial right tenth rib, right T11 transverse process, and right L2 transverse process, similar to the prior PET-CT. Paraspinal and other soft tissues: Cholecystectomy. Aortoiliac stent graft. Emphysema. Aortic atherosclerosis. Dependent atelectasis in the lungs, likely with small left and possible trace right pleural effusions. Disc levels: Mild disc degeneration and advanced facet arthrosis in the lower lumbar spine. No evidence of high-grade stenosis. IMPRESSION: 1. Acute, at most minimally displaced fractures through the T11-12 disc space and adjacent vertebral bodies. 2. 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 3. Known small sclerotic bone metastases. 4.  Aortic Atherosclerosis (ICD10-I70.0). Electronically Signed   By: Dasie Hamburg M.D.   On: 09/08/2023 16:28   DG Lumbar Spine 2-3 Views Result Date: 08/29/2023 CLINICAL DATA:  Fall EXAM: LUMBAR SPINE - 2-3 VIEW COMPARISON:  None Available. FINDINGS: Normal alignment. No fracture. Diffuse degenerative disc and facet disease. SI joints symmetric and unremarkable. Prior stent graft repair of AAA. IMPRESSION: No acute bony abnormality. Electronically Signed   By: Franky Crease M.D.   On: 08/29/2023 19:52    Microbiology: No results found for this or any previous visit (from the past 240 hours).   Labs: Basic Metabolic Panel: Recent Labs  Lab 09/23/23 0831 09/26/23 0554  NA 138 142  K 3.8 3.4*  CL 99 106  CO2 30 23  GLUCOSE 108* 85   BUN 15 22  CREATININE 0.78 0.72  CALCIUM  7.9* 7.8*   Liver Function Tests: No results for input(s): AST, ALT, ALKPHOS, BILITOT, PROT, ALBUMIN in the last 168 hours. No results for input(s): LIPASE, AMYLASE in the last 168 hours. No results for input(s): AMMONIA in the last 168 hours. CBC: Recent Labs  Lab 09/23/23 0831 09/26/23 0554  WBC 9.2 11.9*  HGB 12.4* 11.6*  HCT 37.0* 35.5*  MCV 95.6 97.0  PLT 270 214   Cardiac Enzymes: No results for input(s): CKTOTAL, CKMB, CKMBINDEX, TROPONINI in the last 168 hours. BNP: BNP (last 3 results) Recent Labs    09/09/23 1003  BNP 252.5*    ProBNP (last 3 results) No results for input(s): PROBNP in the last 8760 hours.  CBG: No results for input(s): GLUCAP in the last 168 hours.     Signed:  [name] MD.  [facility] 09/27/2023, 11:47 AM

## 2023-09-27 NOTE — TOC Progression Note (Signed)
 Transition of Care [facility]) - Progression Note    Patient Details  Name: DIONTRE HARPS MRN: [id number] Date of Birth: [birth year]  Transition [facility] CM/SW Contact  Tomasa JAYSON Childes, RN Phone Number: 09/27/2023, 10:31 AM  Clinical Narrative:    Spoke with Wells in admissions at [facility] to see if private bed is available. She is checking and will call this RNCM back.          Expected Discharge Plan and Services                                               Social Determinants of Health (SDOH) Interventions SDOH Screenings   Food Insecurity: No Food Insecurity (09/09/2023)  Housing: Low Risk  (09/09/2023)  Transportation Needs: No Transportation Needs (09/09/2023)  Utilities: Not At Risk (09/09/2023)  Social Connections: Patient Unable To Answer (09/21/2023)  Tobacco Use: Medium Risk (09/08/2023)    Readmission Risk Interventions    09/09/2023    1:41 PM  Readmission Risk Prevention Plan  Transportation Screening Complete  PCP or Specialist Appt within 3-5 Days Complete  HRI or Home Care Consult Complete  Social Work Consult for Recovery Care Planning/Counseling Complete  Palliative Care Screening Complete  Medication Review Oceanographer) Complete

## 2023-09-27 NOTE — Plan of Care (Signed)

## 2023-09-27 NOTE — Plan of Care (Signed)
   Problem: Education: Goal: Knowledge of General Education information will improve Description: Including pain rating scale, medication(s)/side effects and non-pharmacologic comfort measures Outcome: Not Progressing

## 2023-09-27 NOTE — TOC Transition Note (Addendum)
 Transition of Care [facility]) - Discharge Note   Patient Details  Name: Steven Elliott MRN: [id number] Date of Birth: 1930-02-09  [facility] CM/SW Contact:  Kamin Niblack C Om Lizotte, RN Phone Number: 09/27/2023, 11:50 AM   Clinical Narrative:    Spoke with Sierra  in admissions at [facility]. Per facility patient admission confirmed for today. Patient assigned room # [id number] Nurse will call report to [phone]  Face sheet and medical necessity forms printed to the floor to be added to the EMS pack EMS arranged He's second.  Discharge summary and SNF transfer report sent in HUB.  Nurse, and family notified spoke with his daughter, [name]. TOC signing off.   Updated discharge summary sent to facility to include MD's change of diet to nectar thick.          Patient Goals and CMS Choice            Discharge Placement                       Discharge Plan and Services Additional resources added to the After Visit Summary for                                       Social Drivers of Health (SDOH) Interventions SDOH Screenings   Food Insecurity: No Food Insecurity (09/09/2023)  Housing: Low Risk  (09/09/2023)  Transportation Needs: No Transportation Needs (09/09/2023)  Utilities: Not At Risk (09/09/2023)  Social Connections: Patient Unable To Answer (09/21/2023)  Tobacco Use: Medium Risk (09/08/2023)     Readmission Risk Interventions    09/09/2023    1:41 PM  Readmission Risk Prevention Plan  Transportation Screening Complete  PCP or Specialist Appt within 3-5 Days Complete  HRI or Home Care Consult Complete  Social Work Consult for Recovery Care Planning/Counseling Complete  Palliative Care Screening Complete  Medication Review Oceanographer) Complete

## 2023-09-28 ENCOUNTER — Emergency Department (HOSPITAL_COMMUNITY)
Admission: EM | Admit: 2023-09-28 | Discharge: 2023-09-29 | Disposition: A | Discharge status: Home / Self Care | Payer: No Typology Code available for payment source | Attending: Emergency Medicine | Admitting: Emergency Medicine

## 2023-09-28 ENCOUNTER — Emergency Department (HOSPITAL_COMMUNITY): Payer: No Typology Code available for payment source

## 2023-09-28 ENCOUNTER — Encounter (HOSPITAL_COMMUNITY): Payer: Self-pay

## 2023-09-28 ENCOUNTER — Other Ambulatory Visit: Payer: Self-pay

## 2023-09-28 DIAGNOSIS — S80211A Abrasion, right knee, initial encounter: Secondary | ICD-10-CM | POA: Insufficient documentation

## 2023-09-28 DIAGNOSIS — W19XXXA Unspecified fall, initial encounter: Secondary | ICD-10-CM

## 2023-09-28 DIAGNOSIS — S0081XA Abrasion of other part of head, initial encounter: Secondary | ICD-10-CM | POA: Diagnosis not present

## 2023-09-28 DIAGNOSIS — J449 Chronic obstructive pulmonary disease, unspecified: Secondary | ICD-10-CM | POA: Diagnosis not present

## 2023-09-28 DIAGNOSIS — S59911A Unspecified injury of right forearm, initial encounter: Secondary | ICD-10-CM | POA: Diagnosis present

## 2023-09-28 DIAGNOSIS — M546 Pain in thoracic spine: Secondary | ICD-10-CM | POA: Diagnosis not present

## 2023-09-28 DIAGNOSIS — I10 Essential (primary) hypertension: Secondary | ICD-10-CM | POA: Diagnosis not present

## 2023-09-28 DIAGNOSIS — R519 Headache, unspecified: Secondary | ICD-10-CM | POA: Insufficient documentation

## 2023-09-28 DIAGNOSIS — W06XXXA Fall from bed, initial encounter: Secondary | ICD-10-CM | POA: Diagnosis not present

## 2023-09-28 DIAGNOSIS — S5011XA Contusion of right forearm, initial encounter: Secondary | ICD-10-CM | POA: Diagnosis not present

## 2023-09-28 LAB — CBC
HCT: 40.1 % (ref 39.0–52.0)
Hemoglobin: 12.9 g/dL — ABNORMAL LOW (ref 13.0–17.0)
MCH: 32 pg (ref 26.0–34.0)
MCHC: 32.2 g/dL (ref 30.0–36.0)
MCV: 99.5 fL (ref 80.0–100.0)
Platelets: 317 10*3/uL (ref 150–400)
RBC: 4.03 MIL/uL — ABNORMAL LOW (ref 4.22–5.81)
RDW: 14.6 % (ref 11.5–15.5)
WBC: 10.2 10*3/uL (ref 4.0–10.5)
nRBC: 0 % (ref 0.0–0.2)

## 2023-09-28 LAB — COMPREHENSIVE METABOLIC PANEL
ALT: 18 U/L (ref 0–44)
AST: 28 U/L (ref 15–41)
Albumin: 2.8 g/dL — ABNORMAL LOW (ref 3.5–5.0)
Alkaline Phosphatase: 118 U/L (ref 38–126)
Anion gap: 15 (ref 5–15)
BUN: 12 mg/dL (ref 8–23)
CO2: 23 mmol/L (ref 22–32)
Calcium: 8.2 mg/dL — ABNORMAL LOW (ref 8.9–10.3)
Chloride: 102 mmol/L (ref 98–111)
Creatinine, Ser: 0.91 mg/dL (ref 0.61–1.24)
GFR, Estimated: >60 mL/min (ref >60)
Glucose, Bld: 106 mg/dL — ABNORMAL HIGH (ref 70–99)
Potassium: 3.4 mmol/L — ABNORMAL LOW (ref 3.5–5.1)
Sodium: 140 mmol/L (ref 135–145)
Total Bilirubin: 1.6 mg/dL — ABNORMAL HIGH (ref 0.0–1.2)
Total Protein: 6.5 g/dL (ref 6.5–8.1)

## 2023-09-28 LAB — CBG MONITORING, ED: Glucose-Capillary: 98 mg/dL (ref 70–99)

## 2023-09-28 LAB — PROTIME-INR
INR: 1 (ref 0.8–1.2)
Prothrombin Time: 13.7 s (ref 11.4–15.2)

## 2023-09-28 MED ORDER — IOHEXOL 350 MG/ML SOLN
75.0000 mL | Freq: Once | INTRAVENOUS | Status: AC | PRN
  Administered 2023-09-28: 75 mL via INTRAVENOUS

## 2023-09-28 MED ORDER — TRAMADOL HCL 50 MG PO TABS: 50.0000 mg | ORAL_TABLET | Freq: Once | ORAL | Status: DC

## 2023-09-28 MED ORDER — ACETAMINOPHEN 500 MG PO TABS
1000.0000 mg | ORAL_TABLET | Freq: Once | ORAL | Status: AC
  Administered 2023-09-29: 1000 mg via ORAL
  Filled 2023-09-28: qty 2

## 2023-09-28 NOTE — ED Notes (Addendum)
 PO challenge: patient tolerated sipping water well. Did not want any crackers. Daughter says he needs thickener and uses nectar on a regular basis.

## 2023-09-28 NOTE — ED Provider Notes (Addendum)
  EMERGENCY DEPARTMENT AT [facility] Provider Note   CSN: [id number] Arrival date & time: 09/28/23  1814     History  Chief Complaint  Patient presents with   Steven Elliott is a 89 y.o. male.   Fall     89 year old male with medical history significant for abdominal aortic aneurysm, emphysema, peripheral vascular disease, GERD, COPD, hypertension presenting to the emergency department from [facility] patient still remains assisted living facility after falling out of bed this morning.  The patient sustained a blow to the right cheek, bruising on the right arm and an abrasion to the right knee.  He has a history of T11 and T12 fracture, is not on anticoagulation, has been having problems with pain control associated with the fracture.  No focal neurologic deficits, no urinary or fecal incontinence.  He arrives GCS 15, ABC intact.  Home Medications Prior to Admission medications   Medication Sig Start Date End Date Taking? Authorizing Provider  acetaminophen  (TYLENOL ) 500 MG tablet Take 500 mg by mouth every 6 (six) hours as needed.    [provider]  albuterol  (PROVENTIL  HFA;VENTOLIN  HFA) 108 (90 Base) MCG/ACT inhaler Inhale 2 puffs into the lungs every 6 (six) hours as needed for wheezing or shortness of breath.    [provider]  Calcium  Carb-Cholecalciferol  600-10 MG-MCG TABS Take 1 tablet by mouth 2 (two) times daily. 02/25/23   [provider]  Calcium  Carbonate-Vitamin D  (CALCIUM -D) 600-400 MG-UNIT TABS Take 1 tablet by mouth 2 (two) times daily.    [provider]  cetirizine  (ZYRTEC ) 5 MG tablet Take 1 tablet (5 mg total) by mouth daily. Patient not taking: Reported on 09/08/2023 09/13/20   Christopher Savannah, PA-C  clotrimazole  (LOTRIMIN ) 1 % cream Apply 1 Application topically 2 (two) times daily. Patient not taking: Reported on 09/08/2023 06/04/22   Vaillancourt, Samantha, PA-C  cyclobenzaprine  (FLEXERIL ) 5 MG  tablet Take 5 mg by mouth 3 (three) times daily as needed for muscle spasms.    [provider]  darolutamide  (NUBEQA ) 300 MG tablet Take 600 mg by mouth 2 (two) times daily with a meal. 06/30/23   [provider]  DULoxetine  (CYMBALTA ) 20 MG capsule Take 20 mg by mouth 2 (two) times daily.    [provider]  famotidine  (PEPCID ) 20 MG tablet Take 1 tablet (20 mg total) by mouth at bedtime. 05/22/22   Josette Ade, MD  feeding supplement (ENSURE ENLIVE / ENSURE PLUS) LIQD Take 237 mLs by mouth 3 (three) times daily between meals. 05/22/22   Josette Ade, MD  fluticasone  (FLONASE ) 50 MCG/ACT nasal spray Place 1 spray into both nostrils daily.    [provider]  gabapentin  (NEURONTIN ) 100 MG capsule Take 1 capsule (100 mg total) by mouth at bedtime. 05/22/22   Josette Ade, MD  galantamine  (RAZADYNE  ER) 16 MG 24 hr capsule Take 1 capsule (16 mg total) by mouth daily with breakfast. 05/23/22   Josette Ade, MD  ipratropium (ATROVENT ) 0.06 % nasal spray Place 2 sprays into both nostrils 2 (two) times daily.    [provider]  magnesium  oxide (MAG-OX) 400 (240 Mg) MG tablet Take 400 mg by mouth daily.    [provider]  memantine  (NAMENDA ) 10 MG tablet Take 10 mg by mouth 2 (two) times daily.    [provider]  Multiple Vitamin (MULTIVITAMIN WITH MINERALS) TABS tablet Take 1 tablet by mouth daily.    [provider]  olopatadine (PATANOL) 0.1 % ophthalmic solution Place 1 drop into both eyes 2 (two) times daily. 07/28/23   [provider]  tamsulosin  (FLOMAX ) 0.4 MG CAPS capsule Take 0.4 mg by mouth at bedtime.    [provider]  traZODone  (DESYREL ) 50 MG tablet Take 1 tablet (50 mg total) by mouth at bedtime. 05/22/22   Josette Ade, MD  vitamin B-12 (CYANOCOBALAMIN ) 500 MCG tablet Take 1 tablet by mouth daily. 11/06/21   [provider]      Allergies    Procaine, Ciprofloxacin, Hydrocodone ,  Hydrocodone -acetaminophen , Lisinopril, Omeprazole, Penicillins, and Rabeprazole    Review of Systems   Review of Systems  All other systems reviewed and are negative.   Physical Exam Updated Vital Signs BP (!) 158/74   Pulse 84   Temp 98.4 F (36.9 C) (Axillary)   Resp 17   SpO2 94%  Physical Exam Vitals and nursing note reviewed.  Constitutional:      Appearance: He is well-developed.     Comments: GCS 15, ABC intact  HENT:     Head: Normocephalic.     Comments: Slight abrasion to right cheek Eyes:     Conjunctiva/sclera: Conjunctivae normal.  Neck:     Comments: No midline tenderness to palpation of the cervical spine. ROM intact. Cardiovascular:     Rate and Rhythm: Normal rate and regular rhythm.  Pulmonary:     Effort: Pulmonary effort is normal. No respiratory distress.     Breath sounds: Normal breath sounds.  Chest:     Comments: Pain upon palpation of the sternum. Clavicles stable and non-tender to AP compression Abdominal:     Palpations: Abdomen is soft.     Tenderness: There is no abdominal tenderness.     Comments: Pelvis stable to lateral compression.  Musculoskeletal:     Cervical back: Neck supple.     Comments: Thoracic spine TTP, no lumbar TTP. Extremities atraumatic with intact ROM with the exception of an abrasion to the right knee. Scattered bruising to the right forearm  Skin:    General: Skin is warm and dry.  Neurological:     Mental Status: He is alert.     Comments: CN II-XII grossly intact. Moving all four extremities spontaneously and sensation grossly intact.     ED Results / Procedures / Treatments   Labs (all labs ordered are listed, but only abnormal results are displayed) Labs Reviewed  COMPREHENSIVE METABOLIC PANEL - Abnormal; Notable for the following components:      Result Value   Potassium 3.4 (*)    Glucose, Bld 106 (*)    Calcium  8.2 (*)    Albumin 2.8 (*)    Total Bilirubin 1.6 (*)    All other components within  normal limits  CBC - Abnormal; Notable for the following components:   RBC 4.03 (*)    Hemoglobin 12.9 (*)    All other components within normal limits  PROTIME-INR  CBG MONITORING, ED    EKG None  Radiology CT L-SPINE NO CHARGE Result Date: 09/28/2023 CLINICAL DATA:  Fall EXAM: CT LUMBAR SPINE WITHOUT CONTRAST TECHNIQUE: Multidetector CT imaging of the lumbar spine was performed without intravenous contrast administration. Multiplanar CT image reconstructions were also generated. RADIATION DOSE REDUCTION: This exam was performed according to the departmental dose-optimization program which includes automated exposure control, adjustment of the mA and/or kV according to patient size and/or use of iterative reconstruction technique. COMPARISON:  None Available. FINDINGS: Segmentation: 5 lumbar type  vertebrae. Alignment: Normal. Vertebrae: No acute fracture or focal pathologic process. Paraspinal and other soft tissues: Negative Disc levels: Diffuse degenerative disc disease and facet disease. No disc herniation. IMPRESSION: No acute bony abnormality. Electronically Signed   By: Franky Crease M.D.   On: 09/28/2023 23:11   CT CHEST ABDOMEN PELVIS W CONTRAST Result Date: 09/28/2023 CLINICAL DATA:  Polytrauma, blunt.  Fall. EXAM: CT CHEST, ABDOMEN, AND PELVIS WITH CONTRAST TECHNIQUE: Multidetector CT imaging of the chest, abdomen and pelvis was performed following the standard protocol during bolus administration of intravenous contrast. RADIATION DOSE REDUCTION: This exam was performed according to the departmental dose-optimization program which includes automated exposure control, adjustment of the mA and/or kV according to patient size and/or use of iterative reconstruction technique. CONTRAST:  75mL OMNIPAQUE  IOHEXOL  350 MG/ML SOLN COMPARISON:  None Available. FINDINGS: CT CHEST FINDINGS Cardiovascular: Heart is normal size. Aorta is normal caliber. Coronary artery and aortic atherosclerosis.  Mediastinum/Nodes: No mediastinal, hilar, or axillary adenopathy. Trachea and esophagus are unremarkable. Thyroid unremarkable. Lungs/Pleura: Small layering bilateral pleural effusions. Compressive atelectasis in the lower lobes. Severe bullous emphysema. No pneumothorax. Musculoskeletal: Chest wall soft tissues are unremarkable. No acute bony abnormality. CT ABDOMEN PELVIS FINDINGS Hepatobiliary: No suspicious focal hepatic abnormality or evidence of hepatic injury. Prior cholecystectomy. Pancreas: No focal abnormality or ductal dilatation. Spleen: No splenic injury or perisplenic hematoma. Adrenals/Urinary Tract: No adrenal hemorrhage or renal injury identified. Bladder is unremarkable. Stomach/Bowel: Colonic diverticulosis, most severe in the sigmoid colon. Stomach and small bowel decompressed. Vascular/Lymphatic: Prior stent graft repair of abdominal aortic aneurysm. Aneurysm sac size measures up to 4 cm. Diffuse aortoiliac atherosclerosis. Reproductive: Prostate enlargement. Other: Large bilateral inguinal hernias containing fat. No free fluid. Single locule of free intraperitoneal air noted on image 64 of series 5. Exact source of this free air not known or visualized. Musculoskeletal: No acute bony abnormality. IMPRESSION: Single small locule of free intraperitoneal air noted in the anterior abdomen of unknown source. There is extensive colonic diverticulosis, but no evidence for active diverticulitis. No other evidence to suggest this is posttraumatic. Small bilateral pleural effusions. Dependent atelectasis in the lower lobes. Severe COPD. Coronary artery disease, aortic atherosclerosis. Status post stent graft of AAA. Electronically Signed   By: Franky Crease M.D.   On: 09/28/2023 22:20   CT CERVICAL SPINE WO CONTRAST Result Date: 09/28/2023 CLINICAL DATA:  Polytrauma, blunt.  Fall. EXAM: CT CERVICAL SPINE WITHOUT CONTRAST TECHNIQUE: Multidetector CT imaging of the cervical spine was performed without  intravenous contrast. Multiplanar CT image reconstructions were also generated. RADIATION DOSE REDUCTION: This exam was performed according to the departmental dose-optimization program which includes automated exposure control, adjustment of the mA and/or kV according to patient size and/or use of iterative reconstruction technique. COMPARISON:  03/24/2022 FINDINGS: Alignment: Normal Skull base and vertebrae: No acute fracture. No primary bone lesion or focal pathologic process. Soft tissues and spinal canal: No prevertebral fluid or swelling. No visible canal hematoma. Disc levels: Diffuse degenerative disc disease with disc space narrowing and spurring. Moderate bilateral degenerative facet disease. Upper chest: Severe emphysema. Bilateral pleural effusions partially visualized. Other: None IMPRESSION: No acute bony abnormality. Diffuse degenerative disc and facet disease. COPD. Bilateral pleural effusions partially visualized. Electronically Signed   By: Franky Crease M.D.   On: 09/28/2023 22:12   CT HEAD WO CONTRAST Result Date: 09/28/2023 CLINICAL DATA:  Head trauma, moderate-severe.  Fall. EXAM: CT HEAD WITHOUT CONTRAST TECHNIQUE: Contiguous axial images were obtained from the base of the skull through  the vertex without intravenous contrast. RADIATION DOSE REDUCTION: This exam was performed according to the departmental dose-optimization program which includes automated exposure control, adjustment of the mA and/or kV according to patient size and/or use of iterative reconstruction technique. COMPARISON:  09/09/2023 FINDINGS: Brain: There is atrophy and chronic small vessel disease changes. No acute intracranial abnormality. Specifically, no hemorrhage, hydrocephalus, mass lesion, acute infarction, or significant intracranial injury. Vascular: Atherosclerotic calcifications again noted. Calcified right MCA aneurysm again noted, 1.3 cm, not significantly changed. Skull: No acute calvarial abnormality.  Sinuses/Orbits: No acute findings Other: None IMPRESSION: Atrophy, chronic microvascular disease. No acute intracranial abnormality. Stable calcified right MCA aneurysm. Electronically Signed   By: Franky Crease M.D.   On: 09/28/2023 22:09   DG Forearm Right Result Date: 09/28/2023 CLINICAL DATA:  Clemens out of bed this morning. Bruising on right arm. EXAM: RIGHT FOREARM - 2 VIEW COMPARISON:  None Available. FINDINGS: There is no evidence of fracture or other focal bone lesions. Degenerative arthritis about the radiocarpal joint and first CMC joint. Olecranon spur. Soft tissues are unremarkable. IMPRESSION: No acute fracture or dislocation. Electronically Signed   By: Norman Gatlin M.D.   On: 09/28/2023 20:04    Procedures Procedures    Medications Ordered in ED Medications  traMADol  (ULTRAM ) tablet 50 mg (has no administration in time range)  iohexol  (OMNIPAQUE ) 350 MG/ML injection 75 mL (75 mLs Intravenous Contrast Given 09/28/23 2200)    ED Course/ Medical Decision Making/ A&P                                 Medical Decision Making Amount and/or Complexity of Data Reviewed Labs: ordered. Radiology: ordered.  Risk Prescription drug management.     89 year old male with medical history significant for abdominal aortic aneurysm, emphysema, peripheral vascular disease, GERD, COPD, hypertension presenting to the emergency department from [facility] patient still remains assisted living facility after falling out of bed this morning.  The patient sustained a blow to the right cheek, bruising on the right arm and an abrasion to the right knee.  He has a history of T11 and T12 fracture, is not on anticoagulation, has been having problems with pain control associated with the fracture.  No focal neurologic deficits, no urinary or fecal incontinence.  He arrives GCS 15, ABC intact.  On arrival, the patient was vitally stable, afebrile, not tachycardic or tachypneic, BP 151/86, saturating 92 to  94% on room air.  Laboratory evaluation was performed which showed CBG normal, CBC without leukocytosis, mild anemia to 12.9, CMP with mild hypokalemia to 3.4, otherwise generally unremarkable.  CT Head and C Spine: IMPRESSION:  Atrophy, chronic microvascular disease.    No acute intracranial abnormality.    Stable calcified right MCA aneurysm.   IMPRESSION:  No acute bony abnormality.    Diffuse degenerative disc and facet disease.    COPD. Bilateral pleural effusions partially visualized.    XR Right Forearm: IMPRESSION:  No acute fracture or dislocation.   CT Chest Abdomen Pelvis:  IMPRESSION:  Single small locule of free intraperitoneal air noted in the  anterior abdomen of unknown source. There is extensive colonic  diverticulosis, but no evidence for active diverticulitis. No other  evidence to suggest this is posttraumatic.    Small bilateral pleural effusions. Dependent atelectasis in the  lower lobes.    Severe COPD.    Coronary artery disease, aortic atherosclerosis. Status post stent  graft of AAA.    Given the findings of free air, I discussed the patient's case with on-call general surgery, Dr. Lyndel who evaluated the patient CT imaging.  Patient had similar findings on CT imaging dating back to 09/13/23.  This free air appears to be of unclear etiology but also appears to be resolving.  The patient only has mild tenderness on exam and is not peritonitic.  Low concern for any acute abnormality at this time.  General surgery did not recommend any further interventions, felt the patient could be stable for discharge. The patient successfully passed a PO challenge and is at his baseline mental status.  CT imaging of the T and L-spine was pending at time of signout, signout given to Dr. Melvenia pending results of final trauma imaging, ultimate plan for likely discharge back to his skilled nursing facility.   Final Clinical Impression(s) / ED  Diagnoses Final diagnoses:  Fall, initial encounter    Rx / DC Orders ED Discharge Orders     None         Jerrol Agent, MD 09/28/23 [id number]    Jerrol Agent, MD 09/28/23 2335

## 2023-09-28 NOTE — ED Triage Notes (Signed)
 Pt BIB [facility] from [facility] after falling out of bed this morning. Per family pt had redness to R cheek, bruising on R arm, and a small lac to R knee. Pt has hx of T-11-12 fx. Pt not on blood thinners and all VSS per EMS. Per EMS pt has unequal pupils with pinpoint in R eye, both sluggish to react.

## 2023-09-28 NOTE — Discharge Instructions (Addendum)
 Your T11 fracture looks slightly worse than before.  A prescription for lidocaine  patches was sent to your pharmacy.  These can be switched out daily.  Avoid having more than 1 patch on your skin at any given time.  Ensure that you wear your back brace.  Keep your appointment with the spine doctors next week.  Return to the emergency department for any new or worsening symptoms of concern.

## 2023-09-29 MED ORDER — LIDOCAINE 5 % EX PTCH
1.0000 | MEDICATED_PATCH | CUTANEOUS | Status: DC
  Administered 2023-09-29: 1 via TRANSDERMAL
  Filled 2023-09-29: qty 1

## 2023-09-29 MED ORDER — METHOCARBAMOL 500 MG PO TABS
500.0000 mg | ORAL_TABLET | Freq: Once | ORAL | Status: AC
  Administered 2023-09-29: 500 mg via ORAL
  Filled 2023-09-29: qty 1

## 2023-09-29 MED ORDER — LIDOCAINE 5 % EX PTCH: 1.0000 | MEDICATED_PATCH | CUTANEOUS | 0 refills | Status: DC

## 2023-09-29 NOTE — ED Provider Notes (Signed)
 Care of patient assumed from Dr. Fabiola.  This patient presented after a fall out of bed.  This occurred at nursing facility.  He was discharged from the hospital yesterday.  He has known T11 fracture.  Imaging today shows tiny locule of intraperitoneal air.  This was discussed with general surgery who noted that this was identified on prior imaging and does look better today.  This does not appear to be traumatic or acute.  CT of T-spine shows slight worsening of his known T11 fracture.  He has TLSO brace at his facility.  Patient to get additional pain control prior to discharge. Physical Exam  BP (!) 158/74   Pulse 84   Temp 98.4 F (36.9 C) (Axillary)   Resp 17   SpO2 94%   Physical Exam Vitals and nursing note reviewed.  Constitutional:      General: He is not in acute distress.    Appearance: He is well-developed. He is not ill-appearing, toxic-appearing or diaphoretic.  HENT:     Head: Normocephalic and atraumatic.     Right Ear: External ear normal.     Left Ear: External ear normal.     Nose: Nose normal.  Eyes:     Extraocular Movements: Extraocular movements intact.     Conjunctiva/sclera: Conjunctivae normal.  Cardiovascular:     Rate and Rhythm: Normal rate and regular rhythm.  Pulmonary:     Effort: Pulmonary effort is normal. No respiratory distress.  Abdominal:     General: Abdomen is flat. There is no distension.  Musculoskeletal:        General: No swelling. Normal range of motion.     Cervical back: Normal range of motion and neck supple.  Skin:    General: Skin is warm and dry.     Coloration: Skin is not jaundiced or pale.  Neurological:     General: No focal deficit present.     Mental Status: He is alert.  Psychiatric:        Mood and Affect: Mood normal.        Behavior: Behavior normal.     Procedures  Procedures  ED Course / MDM    Medical Decision Making Amount and/or Complexity of Data Reviewed Labs: ordered. Radiology:  ordered.  Risk OTC drugs. Prescription drug management.   On assessment, patient resting in bed.  Daughter is at bedside.  Daughter states that he has had delirium in the past with narcotic pain medication.  She requests not giving narcotics.  Lidocaine  patch, Tylenol , and Robaxin  were ordered.  Patient was discharged in stable condition.       Melvenia Motto, MD 09/29/23 [phone]

## 2023-10-04 ENCOUNTER — Ambulatory Visit: Payer: No Typology Code available for payment source | Admitting: Orthopedic Surgery

## 2023-10-04 ENCOUNTER — Other Ambulatory Visit: Payer: Self-pay

## 2023-10-04 DIAGNOSIS — S22080S Wedge compression fracture of T11-T12 vertebra, sequela: Secondary | ICD-10-CM

## 2023-10-05 ENCOUNTER — Ambulatory Visit: Payer: No Typology Code available for payment source | Admitting: Neurosurgery

## 2023-10-06 ENCOUNTER — Ambulatory Visit: Payer: No Typology Code available for payment source | Admitting: Physician Assistant

## 2023-10-06 NOTE — Addendum Note (Signed)
 Addended byBETHA HILMA HASTINGS on: 10/06/2023 09:01 PM   Modules accepted: Orders

## 2023-10-06 NOTE — Progress Notes (Addendum)
Referring Physician:  Clinic, [facility] [address] [facility] [place],  [place] [address]  Primary Physician:  Clinic, [facility]  History of Present Illness: 10/07/2023 [name] has a history of AAA, HTN, COPD, dementia, prostate CA with mets to bone, BPH, CKD stage 3a.   Seen as hospital consult by [name] on 09/09/23 for T11-T12 fracture s/p fall on 08/29/23. He has known DISH or ankylosing spondylitis.   He was to wear TLSO brace when OOB and follow up as outpatient.   He was back in ED on 09/28/23 after a fall. CT showed some progression of T11 fracture.   He is here for follow up.   He has constant mid to lower back pain that varies in severity. No leg pain. Pain is worse with moving around. Some relief with tylenol. No numbness, tingling. No gross weakness in his arms or legs.   He is taking tylenol, flexeril, neurontin.   Bowel/Bladder Dysfunction: none  Review of Systems:  A 10 point review of systems is negative, except for the pertinent positives and negatives detailed in the HPI.  Past Medical History: Past Medical History:  Diagnosis Date   AKI (acute kidney injury) (HCC) 05/21/2022   Allergic rhinitis    Aneurysm of infrarenal abdominal aorta (HCC) 02/2015   5.4 cm by CT 03/20/16.    Anosmia    BPH (benign prostatic hyperplasia)    COPD (chronic obstructive pulmonary disease) (HCC)    Diverticulosis    Emphysema (subcutaneous) (surgical) resulting from a procedure    severe by CT 03/20/16   GERD (gastroesophageal reflux disease)    History of bilateral inguinal hernias 02/2015   Hypertension    Peripheral vascular disease (HCC) 02/2015   heavily calcified but patent bil iliac arteries by CTA 03/20/16.    Past Surgical History: Past Surgical History:  Procedure Laterality Date   APPENDECTOMY     CHOLECYSTECTOMY     COLONOSCOPY  ~ 2007   at [facility]   SKIN CANCER EXCISION     TOTAL KNEE ARTHROPLASTY Left 2014     Allergies: Allergies as of 10/07/2023 - Review Complete 10/07/2023  Allergen Reaction Noted   Procaine Anaphylaxis 03/20/2016   Ciprofloxacin Other (See Comments) and Nausea And Vomiting 03/20/2016   Hydrocodone  09/30/2021   Hydrocodone-acetaminophen Itching 03/20/2016   Lisinopril Other (See Comments) 09/30/2021   Morphine Other (See Comments) 10/07/2023   Omeprazole Other (See Comments) 03/20/2016   Penicillins Other (See Comments) 03/20/2016   Rabeprazole Other (See Comments) 03/20/2016   Tramadol Other (See Comments) 10/07/2023    Medications: Outpatient Encounter Medications as of 10/07/2023  Medication Sig   acetaminophen (TYLENOL) 500 MG tablet Take 500 mg by mouth every 6 (six) hours as needed.   cetirizine (ZYRTEC) 5 MG tablet Take 1 tablet (5 mg total) by mouth daily.   clotrimazole (LOTRIMIN) 1 % cream Apply 1 Application topically 2 (two) times daily.   albuterol (PROVENTIL HFA;VENTOLIN HFA) 108 (90 Base) MCG/ACT inhaler Inhale 2 puffs into the lungs every 6 (six) hours as needed for wheezing or shortness of breath.   Calcium Carb-Cholecalciferol 600-10 MG-MCG TABS Take 1 tablet by mouth 2 (two) times daily.   Calcium Carbonate-Vitamin D (CALCIUM-D) 600-400 MG-UNIT TABS Take 1 tablet by mouth 2 (two) times daily.   cyclobenzaprine (FLEXERIL) 5 MG tablet Take 5 mg by mouth 3 (three) times daily as needed for muscle spasms.   darolutamide (NUBEQA) 300 MG tablet Take 600 mg by mouth  2 (two) times daily with a meal.   DULoxetine (CYMBALTA) 20 MG capsule Take 20 mg by mouth 2 (two) times daily.   famotidine (PEPCID) 20 MG tablet Take 1 tablet (20 mg total) by mouth at bedtime.   feeding supplement (ENSURE ENLIVE / ENSURE PLUS) LIQD Take 237 mLs by mouth 3 (three) times daily between meals.   fluticasone (FLONASE) 50 MCG/ACT nasal spray Place 1 spray into both nostrils daily.   gabapentin (NEURONTIN) 100 MG capsule Take 1 capsule (100 mg total) by mouth at bedtime.    galantamine (RAZADYNE ER) 16 MG 24 hr capsule Take 1 capsule (16 mg total) by mouth daily with breakfast.   ipratropium (ATROVENT) 0.06 % nasal spray Place 2 sprays into both nostrils 2 (two) times daily.   lidocaine (LIDODERM) 5 % Place 1 patch onto the skin daily. Remove & Discard patch within 12 hours or as directed by MD   magnesium oxide (MAG-OX) 400 (240 Mg) MG tablet Take 400 mg by mouth daily.   memantine (NAMENDA) 10 MG tablet Take 10 mg by mouth 2 (two) times daily.   Multiple Vitamin (MULTIVITAMIN WITH MINERALS) TABS tablet Take 1 tablet by mouth daily.   olopatadine (PATANOL) 0.1 % ophthalmic solution Place 1 drop into both eyes 2 (two) times daily.   tamsulosin (FLOMAX) 0.4 MG CAPS capsule Take 0.4 mg by mouth at bedtime.   traZODone (DESYREL) 50 MG tablet Take 1 tablet (50 mg total) by mouth at bedtime.   vitamin B-12 (CYANOCOBALAMIN) 500 MCG tablet Take 1 tablet by mouth daily.   No facility-administered encounter medications on file as of 10/07/2023.    Social History: Social History   Tobacco Use   Smoking status: Former    Types: Cigarettes, Cigars   Smokeless tobacco: Never  Vaping Use   Vaping status: Never Used  Substance Use Topics   Alcohol use: Yes    Comment: occ   Drug use: No    Family Medical History: Family History  Problem Relation Age of Onset   Breast cancer Mother    Pancreatitis Mother    Breast cancer Sister     Physical Examination: Vitals:   10/07/23 1408  BP: 112/70    General: Patient is well developed, well nourished, calm, collected, and in no apparent distress. Attention to examination is appropriate.  Respiratory: Patient is breathing without any difficulty.   NEUROLOGICAL:     He is grossly alert and oriented. Answers questions. He is sleepy.   He has mild tenderness at TL junction.   He is wearing TLSO brace.   No abnormal lesions on exposed skin.   Strength: Side Biceps Triceps Deltoid Interossei Grip Wrist Ext.  Wrist Flex.  R 5 5 5 5 5 5 5   L 5 5 5 5 5 5 5    Side Iliopsoas Quads Hamstring PF DF EHL  R 5 5 5 5 5 5   L 5 5 5 5 5 5    Reflexes are 1+ and symmetric at the biceps, brachioradialis, patella and achilles.   Hoffman's is absent.  Clonus is not present.   Bilateral upper and lower extremity sensation is intact to light touch.     He is in a WC.   Medical Decision Making  Imaging: CT thoracic spine dated 09/28/23:  FINDINGS: Alignment: Thoracic kyphosis.  Similar retrolisthesis of T11.   Vertebrae: Marked demineralization. Slight interval worsening of the compression fracture of the inferior endplate of T11 compared to 09/14/2023. There is 5  mm of retropulsion of the inferior endplate and less than 25% vertebral body height loss. No evidence of acute fracture.   Paraspinal and other soft tissues: See report from same day CT chest, abdomen, and pelvis.   Disc levels: Fusion of anterior osteophytes throughout the thoracic spine. Multilevel age commensurate spondylosis and facet arthropathy. There is mild spinal canal narrowing secondary to posterior protrusion of the inferior endplate of T11.   IMPRESSION: 1. Slight interval worsening of the compression fracture of the inferior endplate of T11 compared to 09/14/2023. There is 5 mm of retropulsion of the inferior endplate and less than 25% vertebral body height loss. 2. Fusion of anterior osteophytes throughout the thoracic spine with thoracic kyphosis.     Electronically Signed   By: Minerva Fester M.D.   On: 09/28/2023 23:37  I have personally reviewed the images and agree with the above interpretation.  Assessment and Plan: Mr. Pfuhl had a fall on 08/29/23 with T11 compression fracture and probable underlying DISH/ankylosing spondylitis.   He has mid to lower back pain. No arm or leg pain. No numbness, tingling, or weakness.   CT from 09/28/23 reviewed with T11 fracture.   Clinically, he looks well from  standpoint of his compression fracture. He is neurologically intact.   Treatment options discussed with patient and following plan made:   - Continue with TLSO brace when out of bed.  - No bending, twisting, or lifting.  - Follow up with me in 4 weeks with repeat xrays.  - Will need to go to [facility] medical mall 90 minutes prior to his visit with me to get xrays.  - Likely will be in brace for 3 months total.   I spent a total of 30 minutes in face-to-face and non-face-to-face activities related to this patient's care today including review of outside records, review of imaging, review of symptoms, physical exam, discussion of differential diagnosis, discussion of treatment options, and documentation.   Thank you for involving me in the care of this patient.   Drake Leach PA-C Dept. of Neurosurgery

## 2023-10-07 ENCOUNTER — Encounter: Payer: Self-pay | Admitting: Orthopedic Surgery

## 2023-10-07 ENCOUNTER — Ambulatory Visit: Payer: No Typology Code available for payment source | Admitting: Orthopedic Surgery

## 2023-10-07 VITALS — BP 112/70

## 2023-10-07 DIAGNOSIS — W19XXXD Unspecified fall, subsequent encounter: Secondary | ICD-10-CM

## 2023-10-07 DIAGNOSIS — S22080D Wedge compression fracture of T11-T12 vertebra, subsequent encounter for fracture with routine healing: Secondary | ICD-10-CM | POA: Diagnosis not present

## 2023-10-07 NOTE — Patient Instructions (Signed)
It was so nice to see you today. Thank you so much for coming in.    He has a broken bone in his back at T11.   CT scan from 09/28/23 looks okay.   Continue with brace when out of bed. No bending, twisting, or lifting.   Will need to show up 90 minutes prior to his follow up with me to go to medical mall at [facility] and get xrays.   Total time in brace will likely be 3 months.   Please do not hesitate to call if you have any questions or concerns. You can also message me in MyChart.   Drake Leach PA-C [phone]     The physicians and staff at [facility] at [facility] are committed to providing excellent care. You may receive a survey asking for feedback about your experience at our office. We value you your feedback and appreciate you taking the time to to fill it out. The [facility] leadership team is also available to discuss your experience in person, feel free to contact us [phone].

## 2023-10-08 ENCOUNTER — Ambulatory Visit: Payer: No Typology Code available for payment source | Admitting: Physician Assistant

## 2023-10-27 ENCOUNTER — Telehealth: Payer: Self-pay | Admitting: Neurosurgery

## 2023-10-27 NOTE — Telephone Encounter (Signed)
 Steven Elliott pt's daughter is calling. Patient is at [facility] place, they are paying over $200 out of pocket for her father to be there. The nurse from [facility] was supposed to have called this morning regarding the patient's brace. (There was no call before this one today) Per Steven Elliott her sister states that the brace is rubbing the patient's skin that you can now see his spine. Should the patient continue to wear the back brace? Donne would like a call back today. I told her that Dr.Yarbrough is in surgery all afternoon but we will try to give her a call back.

## 2023-10-27 NOTE — Telephone Encounter (Signed)
 Demetrice the nurse at [facility] [phone] is calling also. She is clarifying that there is no open wound. His skin is red from where the bar in the brace is pressing against his spine and this makes him very uncomfortable. The brace is not fitting well because he has a curvature in his spine.They were thinking about adding some type of cushion in between the brace and his skin. What would you recommend?

## 2023-10-27 NOTE — Telephone Encounter (Addendum)
 Clois Fret, MD -->Stop wearing brace - send picture of the area if possible   I spoke with Demetrice, one of the nurses at [facility]. Passed on Dr. Elliot recommendation to discontinue the brace. She passed the phone to the [employer] of Nursing who indicated understanding to discontinue the brace. They will call us  back in regards to figuring out how to send us  a picture of the affected area. They have the office phone number as well as my direct line.

## 2023-10-28 ENCOUNTER — Telehealth: Payer: Self-pay

## 2023-10-28 ENCOUNTER — Other Ambulatory Visit: Payer: Self-pay

## 2023-10-28 DIAGNOSIS — S22080D Wedge compression fracture of T11-T12 vertebra, subsequent encounter for fracture with routine healing: Secondary | ICD-10-CM

## 2023-10-28 DIAGNOSIS — S22080S Wedge compression fracture of T11-T12 vertebra, sequela: Secondary | ICD-10-CM

## 2023-10-28 NOTE — Telephone Encounter (Signed)
 I called Miss [name], Patients daughter.   She had questions about needing the CT scan verus another recommendation from a [place] doctor.   She discussed with Dr. Clois on the phone and he discussed his recommendation with her.   She will try to get the CT the same day as the X-rays to minimize multiple trips to [facility]. Also before appointment, I told her that they would need to arrive earlier than 90 minutes and needs to schedule with radiology.  She is aware to call us  with any issues.

## 2023-10-28 NOTE — Telephone Encounter (Signed)
     Daughter sent some more pictures with showing the whole back as requested by this RN.  Dr. Clois says that given that this is an open wound, brace should be minimized as much as possible to prevent worsening skin break down. Will clarify if he wants to amend the recommendations about wearing the brace. Already advised the facility that he should only be wearing the brace when ambulating, working with PT, Transferring from bed to chair.   He wants Mr. Leider  to be seen by a wound clinic, will also need clarification about if he wants a referral sent to our wound clinic or ok to be seen by facility wound RN.

## 2023-10-28 NOTE — Telephone Encounter (Signed)
 Steven Elliott

## 2023-10-28 NOTE — Telephone Encounter (Signed)
 I called [name], the patient's daughter , he wanted to ask more about Dr. Clois recommendation for the brace.   Dr.Yarbrough, discussed the patient with me more. He wants to see patients skin. We are working on getting a picture. Miss [name] will work on getting this to us . Per Dr. Clois If the wound caused by the brace is not significant then ok to keep wearing the brace with breaks, needs to wear when up and walking.   Will call [name] to amend recommendations to nurses caring for Steven Elliott.

## 2023-10-28 NOTE — Telephone Encounter (Signed)
 [name] would like to talk to the nurse regarding her dad. [phone]

## 2023-10-28 NOTE — Telephone Encounter (Signed)
 Called [facility], left voicemail for nursing supervisor. I called with Dr. Elliot final recommendations regarding this patients care.   CT Thoracic spine ordered to be done in 2 weeks per Dr. Clois. Further evaluation based on this scan will be made whether to discontinue the brace.   SNF Wound RN to treat wounds on back.   Final determination has been made to wear brace only OOB-- ok to remove when OOB and laying back in chair and at low fall risk.   Call back number left for facility if they have questions about Dr. Elliot recommendations.

## 2023-10-28 NOTE — Addendum Note (Signed)
 Addended by: Beza Steppe on: 10/28/2023 12:28 PM   Modules accepted: Orders

## 2023-10-28 NOTE — Telephone Encounter (Signed)
    Pictures sent from [facility] via secure email message to [employer] Sheriden Archibeque RN-BSN.  Discussed with Dr. Clois, continues to agree that he needs to have brace on while up. Can take brace off while in bed and in chair. Okay to apply mepilex over areas of skin breakdown to prevent worsening. Will reiterate this to [facility].

## 2023-10-28 NOTE — Telephone Encounter (Signed)
 Discussed with Demetrice, Nurse at [facility]. She describes that there is a open area on his back ( wound care nurse at the facility is aware and treating) but this area is not over the bony prominces of the spine. The area of concern is where the spine rubs against the brace since he has a curvature of the spine. She states that the are is red with no open or breakage of skin at this point. She endorses that it is very uncomfortable for the patient.   I passed on the recommendation that as long as this area is not open that he can continue to utilize the brace. The patient should wear the brace while up and moving per Dr. Clois.  We discussed that it is okay to take the brace off while the patient is stationary and sitting. Patient should have adequate rest time without the brace on. Nurse indicates understanding and will pass on this information.   She is concerned that the area of redness at curvature of his spine will potentially get worse. Wants to know if it is okay to place a protective bandage like a 4 x 4 mepilex.

## 2023-10-28 NOTE — Telephone Encounter (Signed)
 Left a voicemail via secure line to [employer], Armed forces technical officer at [facility]. Told her that it is okay to place mepilex over area for comfort per Dr. Clois. Reiterated that it is okay to have brace off while in bed and chair but needs to wear when out of the bed ambulating and transferring to the chair. She has the office number to call back with any concerns.

## 2023-10-29 ENCOUNTER — Telehealth: Payer: Self-pay

## 2023-10-29 NOTE — Telephone Encounter (Signed)
 Patient daughter called to clarify that her dad is to wear the brace when standing, walking and can take it off in the chair when supervised and deemed at a low fall risk.   She asked about another brace option. I advised her that this is the brace recommended by Dr. CINDERELLA for this type of fracture. We discussed that he is scheduled for a CT scan 11/11/2023 to determine the healing of his fracture. At this time, we need the scan to make sure we can safely discontinue the brace.   She expresses concern of wounds on the patients back. We discussed that we advised [name]  to cover wounds when using the brace. I told her that if she feels like these are worsening to contact us .   She expresses understanding the the CT Scan is to be done 6 weeks from the date of fracture to reevaluate. The reasoning is that we want to see the healing.   She understands that we want X-rays on 2/17 before his follow up appointment.

## 2023-11-05 ENCOUNTER — Telehealth: Payer: Self-pay

## 2023-11-05 NOTE — Progress Notes (Deleted)
 Referring Physician:  Clinic, [facility] [address] [facility] [place],  [place] [address]  Primary Physician:  Clinic, [facility]  History of Present Illness: 11/05/2023 [name] has a history of AAA, HTN, COPD, dementia, prostate CA with mets to bone, BPH, CKD stage 3a.   Last seen by me on 10/07/23 for T11 fracture s/p fall on 08/29/23. He has known DISH or ankylosing spondylitis.   He was to wear TLSO brace when OOB. Daughter called and he had skin breakdown from the brace. He was to limit wearing only when up and walking. Facility was to pad brace and wound care was consulted.   He has CT scan scheduled on 11/11/23 to determine if he can stop wearing brace.   He is here for further evaluation.        He was back in ED on 09/28/23 after a fall. CT showed some progression of T11 fracture.   He is here for follow up.   He has constant mid to lower back pain that varies in severity. No leg pain. Pain is worse with moving around. Some relief with tylenol. No numbness, tingling. No gross weakness in his arms or legs.   He is taking tylenol, flexeril, neurontin.   Bowel/Bladder Dysfunction: none  Review of Systems:  A 10 point review of systems is negative, except for the pertinent positives and negatives detailed in the HPI.  Past Medical History: Past Medical History:  Diagnosis Date   AKI (acute kidney injury) (HCC) 05/21/2022   Allergic rhinitis    Aneurysm of infrarenal abdominal aorta (HCC) 02/2015   5.4 cm by CT 03/20/16.    Anosmia    BPH (benign prostatic hyperplasia)    COPD (chronic obstructive pulmonary disease) (HCC)    Diverticulosis    Emphysema (subcutaneous) (surgical) resulting from a procedure    severe by CT 03/20/16   GERD (gastroesophageal reflux disease)    History of bilateral inguinal hernias 02/2015   Hypertension    Peripheral vascular disease (HCC) 02/2015   heavily calcified but patent bil iliac arteries by  CTA 03/20/16.    Past Surgical History: Past Surgical History:  Procedure Laterality Date   APPENDECTOMY     CHOLECYSTECTOMY     COLONOSCOPY  ~ 2007   at [facility]   SKIN CANCER EXCISION     TOTAL KNEE ARTHROPLASTY Left 2014    Allergies: Allergies as of 11/08/2023 - Review Complete 10/07/2023  Allergen Reaction Noted   Procaine Anaphylaxis 03/20/2016   Ciprofloxacin Other (See Comments) and Nausea And Vomiting 03/20/2016   Hydrocodone  09/30/2021   Hydrocodone-acetaminophen Itching 03/20/2016   Lisinopril Other (See Comments) 09/30/2021   Morphine Other (See Comments) 10/07/2023   Omeprazole Other (See Comments) 03/20/2016   Penicillins Other (See Comments) 03/20/2016   Rabeprazole Other (See Comments) 03/20/2016   Tramadol Other (See Comments) 10/07/2023    Medications: Outpatient Encounter Medications as of 11/08/2023  Medication Sig   acetaminophen (TYLENOL) 500 MG tablet Take 500 mg by mouth every 6 (six) hours as needed.   albuterol (PROVENTIL HFA;VENTOLIN HFA) 108 (90 Base) MCG/ACT inhaler Inhale 2 puffs into the lungs every 6 (six) hours as needed for wheezing or shortness of breath. (Patient not taking: Reported on 10/07/2023)   Calcium Carb-Cholecalciferol 600-10 MG-MCG TABS Take 1 tablet by mouth 2 (two) times daily. (Patient not taking: Reported on 10/07/2023)   Calcium Carbonate-Vitamin D (CALCIUM-D) 600-400 MG-UNIT TABS Take 1 tablet by mouth 2 (two) times daily.  cetirizine (ZYRTEC) 5 MG tablet Take 1 tablet (5 mg total) by mouth daily.   clotrimazole (LOTRIMIN) 1 % cream Apply 1 Application topically 2 (two) times daily.   cyclobenzaprine (FLEXERIL) 5 MG tablet Take 5 mg by mouth 3 (three) times daily as needed for muscle spasms.   darolutamide (NUBEQA) 300 MG tablet Take 600 mg by mouth 2 (two) times daily with a meal.   DULoxetine (CYMBALTA) 20 MG capsule Take 20 mg by mouth 2 (two) times daily.   famotidine (PEPCID) 20 MG tablet Take 1 tablet (20 mg total) by  mouth at bedtime.   feeding supplement (ENSURE ENLIVE / ENSURE PLUS) LIQD Take 237 mLs by mouth 3 (three) times daily between meals. (Patient not taking: Reported on 10/07/2023)   fluticasone (FLONASE) 50 MCG/ACT nasal spray Place 1 spray into both nostrils daily.   gabapentin (NEURONTIN) 100 MG capsule Take 1 capsule (100 mg total) by mouth at bedtime.   galantamine (RAZADYNE ER) 16 MG 24 hr capsule Take 1 capsule (16 mg total) by mouth daily with breakfast. (Patient taking differently: Take 8 mg by mouth daily with breakfast.)   ipratropium (ATROVENT) 0.06 % nasal spray Place 2 sprays into both nostrils 2 (two) times daily.   lidocaine (LIDODERM) 5 % Place 1 patch onto the skin daily. Remove & Discard patch within 12 hours or as directed by MD   magnesium oxide (MAG-OX) 400 (240 Mg) MG tablet Take 400 mg by mouth daily.   memantine (NAMENDA) 10 MG tablet Take 10 mg by mouth 2 (two) times daily.   Multiple Vitamin (MULTIVITAMIN WITH MINERALS) TABS tablet Take 1 tablet by mouth daily. (Patient not taking: Reported on 10/07/2023)   olopatadine (PATANOL) 0.1 % ophthalmic solution Place 1 drop into both eyes 2 (two) times daily. (Patient not taking: Reported on 10/07/2023)   tamsulosin (FLOMAX) 0.4 MG CAPS capsule Take 0.4 mg by mouth at bedtime.   traZODone (DESYREL) 50 MG tablet Take 1 tablet (50 mg total) by mouth at bedtime. (Patient not taking: Reported on 10/07/2023)   vitamin B-12 (CYANOCOBALAMIN) 500 MCG tablet Take 1 tablet by mouth daily.   No facility-administered encounter medications on file as of 11/08/2023.    Social History: Social History   Tobacco Use   Smoking status: Former    Types: Cigarettes, Cigars   Smokeless tobacco: Never  Vaping Use   Vaping status: Never Used  Substance Use Topics   Alcohol use: Yes    Comment: occ   Drug use: No    Family Medical History: Family History  Problem Relation Age of Onset   Breast cancer Mother    Pancreatitis Mother    Breast  cancer Sister     Physical Examination: There were no vitals filed for this visit.    He is grossly alert and oriented. Answers questions. He is sleepy.   He has mild tenderness at TL junction.   He is wearing TLSO brace.   No abnormal lesions on exposed skin.   Strength: Side Biceps Triceps Deltoid Interossei Grip Wrist Ext. Wrist Flex.  R 5 5 5 5 5 5 5   L 5 5 5 5 5 5 5    Side Iliopsoas Quads Hamstring PF DF EHL  R 5 5 5 5 5 5   L 5 5 5 5 5 5    Reflexes are 1+ and symmetric at the biceps, brachioradialis, patella and achilles.   Hoffman's is absent.  Clonus is not present.   Bilateral upper and  lower extremity sensation is intact to light touch.     He is in a WC.   Medical Decision Making  Imaging: none  Assessment and Plan: Mr. Nemes had a fall on 08/29/23 with T11 compression fracture and probable underlying DISH/ankylosing spondylitis.   He has mid to lower back pain. No arm or leg pain. No numbness, tingling, or weakness.   CT from 09/28/23 reviewed with T11 fracture.   Clinically, he looks well from standpoint of his compression fracture. He is neurologically intact.   Treatment options discussed with patient and following plan made:   - Continue with TLSO brace when out of bed.  - No bending, twisting, or lifting.  - Follow up with me in 4 weeks with repeat xrays.  - Will need to go to [facility] medical mall 90 minutes prior to his visit with me to get xrays.  - Likely will be in brace for 3 months total.   I spent a total of 30 minutes in face-to-face and non-face-to-face activities related to this patient's care today including review of outside records, review of imaging, review of symptoms, physical exam, discussion of differential diagnosis, discussion of treatment options, and documentation.   Thank you for involving me in the care of this patient.   Drake Leach PA-C Dept. of Neurosurgery

## 2023-11-05 NOTE — Telephone Encounter (Addendum)
Discussed patient's upcoming appointment with Drake Leach PA. She wanted me to ask the family if they wanted to keep the appointment on 2/17 or postpone until we have CT results back. Left a voicemail for [name], patient's daughter.  If they keep the appointment then we can defer X-rays.   Alternatively they can keep the appointment as scheduled and [name] will call with the CT results.   I left a voicemail for [name] to call back.   I attempted another call. I spoke with [name] and gave her the option of the later appointment or keep the Monday appointment. She decided that she still wants her father to be seen on Monday.  I informed her that in Stacy's opinion he does not need to get X-rays. She was concerned that he has not had an X-ray since he first fractured his back. I reiterated that we will be able to evaluate the healing of his fracture through his CT scan. She asked if [name] agreed to the X-ray not being down. I reiterated that [name] suggested the CT scan. She indicates understanding and I tell her that I am sorry for the confusion. Last time we talked I was unable to discuss directly with [name] as she was out of the office. She will communicate with [name] that he does not need to arrive 90 minutes before the appointment anymore.

## 2023-11-08 ENCOUNTER — Ambulatory Visit: Payer: No Typology Code available for payment source | Admitting: Orthopedic Surgery

## 2023-11-11 ENCOUNTER — Ambulatory Visit
Admission: RE | Admit: 2023-11-11 | Discharge: 2023-11-11 | Disposition: A | Discharge status: Home / Self Care | Payer: No Typology Code available for payment source | Source: Ambulatory Visit | Attending: Neurosurgery | Admitting: Neurosurgery

## 2023-11-11 DIAGNOSIS — S22080D Wedge compression fracture of T11-T12 vertebra, subsequent encounter for fracture with routine healing: Secondary | ICD-10-CM | POA: Insufficient documentation

## 2023-11-12 ENCOUNTER — Telehealth: Payer: Self-pay | Admitting: Orthopedic Surgery

## 2023-11-12 NOTE — Telephone Encounter (Addendum)
I called and spoke with his daughter Lance Muss. Explained that fracture is not healing and it has progressed. He was discharged home from SNF yesterday.   We recommend that he continue to wear TLSO brace when he is out of bed. She will call [facility] to see if they can help with fit of brace and also with skin breakdown.   Discussed she has the option to have hi, stop wearing brace and we can see how he does. We do not recommend this. She does not want to proceed with this option.   Third option is percutaneous fixation.   She would like to see [name] to discuss percutaneous fixation. Appointment scheduled and he is on cancellation list.   [facility]. I told her that we do not recommend this procedure. Advised if she sees this provider to be sure he has the most recent thoracic CT.   We briefly discussed left lung nodule seen on CT and I recommend she follow up with his PCP at the [facility] about this. I offered to send her a copy of CT results. She wants to get when she comes in for his appointment.   She will keep [place] updated and call with any further questions or concerns.

## 2023-11-12 NOTE — Telephone Encounter (Signed)
Please call reading room to see if they can read his thoracic CT.   I will review with Dr. Katrinka Blazing and let her know.

## 2023-11-12 NOTE — Telephone Encounter (Signed)
[name] patient's daughter is calling. Patient had his CT scan yesterday. Can you review the results and confirm of the patient can stop wearing the brace. The patient is already weak and the brace just puts more weight on him. [name] states that this morning when she stood him him the brace slips down off his body. Can he stop wearing it? *I did tell [name] that the final report takes about 2-3 weeks* She requested to speak to [employer].

## 2023-11-12 NOTE — Telephone Encounter (Signed)
I called the reading room. Radiologist is currently reading this scan. Hopefully we will have the results soon.

## 2023-11-12 NOTE — Telephone Encounter (Signed)
CT of thoracic spine dated 11/11/23:  FINDINGS: Alignment: Unchanged 6 mm retrolisthesis of T11 on T12.   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 Subacute burst fracture of the T11 vertebra with interval vacuum phenomenon and sclerosis of fracture lines revealing involvement of the posterior elements, now making this a complete burst fracture (AO spine type A4). Retrolisthesis suggests injury to the posterior longitudinal ligament (modifier M1 type B).   Paraspinal and other soft tissues: Small bilateral pleural effusions. Severe panlobular emphysema with 12 mm spiculated, solid nodule in the superior segment of the left lower lobe (axial image 62 series 3). This was likely obscured by effusion and adjacent atelectasis on the prior chest CT dated 09/28/2023. Atherosclerotic calcifications of the thoracic aorta and coronary arteries.   Disc levels: Retrolisthesis at T11-12 results in at least moderate spinal canal stenosis and severe bilateral neural foraminal narrowing at this level.   IMPRESSION: 1. Subacute burst fracture of the T11 vertebra with interval vacuum phenomenon and sclerosis of fracture lines revealing involvement of the posterior elements, now making this a complete burst fracture (AO Spine type A4). Retrolisthesis suggests injury to the posterior longitudinal ligament (modifier M1 type B). 2. Retrolisthesis at T11-12 results in at least moderate spinal canal stenosis and severe bilateral neural foraminal narrowing at this level. 3. 12 mm spiculated, solid nodule in the superior segment of the left lower lobe. Consider one of the following in 3 months for both low-risk and high-risk individuals: (a) repeat chest CT, (b) follow-up PET-CT, or (c) tissue sampling. This recommendation follows the consensus statement: Guidelines for Management  of Incidental Pulmonary Nodules Detected on CT Images: From the Fleischner Society 2017; Radiology 2017; 284:228-243. 4. Coronary artery calcifications. 5. Small bilateral pleural effusions.   Emphysema (ICD10-J43.9).     Electronically Signed   By: Orvan Falconer M.D.   On: 11/12/2023 09:48  I have personally reviewed the images and agree with the above interpretation.  Above CT reviewed with [name]. T11 fracture is not healing and has progressed.

## 2023-11-12 NOTE — Telephone Encounter (Signed)
[name] called back. She called radiology and was told that the ordering physician can call and request the results to be read stat. I told [name] that I would pass on the message she said make sure you do it.

## 2023-11-17 ENCOUNTER — Other Ambulatory Visit: Payer: Self-pay

## 2023-11-17 ENCOUNTER — Encounter: Payer: Self-pay | Admitting: Family Medicine

## 2023-11-17 ENCOUNTER — Emergency Department: Payer: No Typology Code available for payment source

## 2023-11-17 ENCOUNTER — Inpatient Hospital Stay
Admission: EM | Admit: 2023-11-17 | Discharge: 2023-12-08 | DRG: 193 | Disposition: A | Discharge status: Home Health | Payer: No Typology Code available for payment source | Attending: Hospitalist | Admitting: Hospitalist

## 2023-11-17 DIAGNOSIS — S22080D Wedge compression fracture of T11-T12 vertebra, subsequent encounter for fracture with routine healing: Secondary | ICD-10-CM | POA: Diagnosis not present

## 2023-11-17 DIAGNOSIS — E43 Unspecified severe protein-calorie malnutrition: Secondary | ICD-10-CM | POA: Insufficient documentation

## 2023-11-17 DIAGNOSIS — S22080A Wedge compression fracture of T11-T12 vertebra, initial encounter for closed fracture: Secondary | ICD-10-CM | POA: Diagnosis present

## 2023-11-17 DIAGNOSIS — Z1152 Encounter for screening for COVID-19: Secondary | ICD-10-CM | POA: Diagnosis not present

## 2023-11-17 DIAGNOSIS — N189 Chronic kidney disease, unspecified: Secondary | ICD-10-CM | POA: Diagnosis present

## 2023-11-17 DIAGNOSIS — F03918 Unspecified dementia, unspecified severity, with other behavioral disturbance: Secondary | ICD-10-CM

## 2023-11-17 DIAGNOSIS — Z515 Encounter for palliative care: Secondary | ICD-10-CM

## 2023-11-17 DIAGNOSIS — E876 Hypokalemia: Secondary | ICD-10-CM | POA: Diagnosis present

## 2023-11-17 DIAGNOSIS — I739 Peripheral vascular disease, unspecified: Secondary | ICD-10-CM | POA: Diagnosis present

## 2023-11-17 DIAGNOSIS — F03911 Unspecified dementia, unspecified severity, with agitation: Secondary | ICD-10-CM | POA: Diagnosis present

## 2023-11-17 DIAGNOSIS — Z681 Body mass index (BMI) 19 or less, adult: Secondary | ICD-10-CM

## 2023-11-17 DIAGNOSIS — J101 Influenza due to other identified influenza virus with other respiratory manifestations: Principal | ICD-10-CM | POA: Diagnosis present

## 2023-11-17 DIAGNOSIS — D638 Anemia in other chronic diseases classified elsewhere: Secondary | ICD-10-CM | POA: Diagnosis present

## 2023-11-17 DIAGNOSIS — I671 Cerebral aneurysm, nonruptured: Secondary | ICD-10-CM | POA: Diagnosis present

## 2023-11-17 DIAGNOSIS — S22089D Unspecified fracture of T11-T12 vertebra, subsequent encounter for fracture with routine healing: Secondary | ICD-10-CM

## 2023-11-17 DIAGNOSIS — E039 Hypothyroidism, unspecified: Secondary | ICD-10-CM | POA: Diagnosis present

## 2023-11-17 DIAGNOSIS — S22080S Wedge compression fracture of T11-T12 vertebra, sequela: Secondary | ICD-10-CM | POA: Diagnosis not present

## 2023-11-17 DIAGNOSIS — M454 Ankylosing spondylitis of thoracic region: Secondary | ICD-10-CM | POA: Diagnosis present

## 2023-11-17 DIAGNOSIS — C61 Malignant neoplasm of prostate: Secondary | ICD-10-CM | POA: Diagnosis not present

## 2023-11-17 DIAGNOSIS — Z8546 Personal history of malignant neoplasm of prostate: Secondary | ICD-10-CM

## 2023-11-17 DIAGNOSIS — J9621 Acute and chronic respiratory failure with hypoxia: Secondary | ICD-10-CM | POA: Diagnosis present

## 2023-11-17 DIAGNOSIS — J439 Emphysema, unspecified: Secondary | ICD-10-CM | POA: Diagnosis present

## 2023-11-17 DIAGNOSIS — Z79899 Other long term (current) drug therapy: Secondary | ICD-10-CM

## 2023-11-17 DIAGNOSIS — Z87891 Personal history of nicotine dependence: Secondary | ICD-10-CM

## 2023-11-17 DIAGNOSIS — K219 Gastro-esophageal reflux disease without esophagitis: Secondary | ICD-10-CM | POA: Diagnosis present

## 2023-11-17 DIAGNOSIS — F0390 Unspecified dementia without behavioral disturbance: Secondary | ICD-10-CM | POA: Diagnosis present

## 2023-11-17 DIAGNOSIS — I951 Orthostatic hypotension: Secondary | ICD-10-CM | POA: Diagnosis present

## 2023-11-17 DIAGNOSIS — Z85828 Personal history of other malignant neoplasm of skin: Secondary | ICD-10-CM

## 2023-11-17 DIAGNOSIS — L0390 Cellulitis, unspecified: Secondary | ICD-10-CM | POA: Diagnosis present

## 2023-11-17 DIAGNOSIS — I5021 Acute systolic (congestive) heart failure: Secondary | ICD-10-CM | POA: Diagnosis not present

## 2023-11-17 DIAGNOSIS — L8990 Pressure ulcer of unspecified site, unspecified stage: Secondary | ICD-10-CM | POA: Insufficient documentation

## 2023-11-17 DIAGNOSIS — E872 Acidosis, unspecified: Secondary | ICD-10-CM | POA: Diagnosis present

## 2023-11-17 DIAGNOSIS — R6510 Systemic inflammatory response syndrome (SIRS) of non-infectious origin without acute organ dysfunction: Secondary | ICD-10-CM

## 2023-11-17 DIAGNOSIS — R1310 Dysphagia, unspecified: Secondary | ICD-10-CM | POA: Diagnosis present

## 2023-11-17 DIAGNOSIS — Z66 Do not resuscitate: Secondary | ICD-10-CM | POA: Diagnosis present

## 2023-11-17 DIAGNOSIS — J441 Chronic obstructive pulmonary disease with (acute) exacerbation: Secondary | ICD-10-CM | POA: Diagnosis present

## 2023-11-17 DIAGNOSIS — W19XXXD Unspecified fall, subsequent encounter: Secondary | ICD-10-CM | POA: Diagnosis present

## 2023-11-17 DIAGNOSIS — J9601 Acute respiratory failure with hypoxia: Principal | ICD-10-CM

## 2023-11-17 DIAGNOSIS — N400 Enlarged prostate without lower urinary tract symptoms: Secondary | ICD-10-CM | POA: Diagnosis present

## 2023-11-17 DIAGNOSIS — J9811 Atelectasis: Secondary | ICD-10-CM | POA: Diagnosis present

## 2023-11-17 DIAGNOSIS — I10 Essential (primary) hypertension: Secondary | ICD-10-CM | POA: Diagnosis present

## 2023-11-17 DIAGNOSIS — R7989 Other specified abnormal findings of blood chemistry: Secondary | ICD-10-CM | POA: Insufficient documentation

## 2023-11-17 DIAGNOSIS — J069 Acute upper respiratory infection, unspecified: Secondary | ICD-10-CM

## 2023-11-17 DIAGNOSIS — I130 Hypertensive heart and chronic kidney disease with heart failure and stage 1 through stage 4 chronic kidney disease, or unspecified chronic kidney disease: Secondary | ICD-10-CM | POA: Diagnosis present

## 2023-11-17 DIAGNOSIS — C7951 Secondary malignant neoplasm of bone: Secondary | ICD-10-CM | POA: Diagnosis present

## 2023-11-17 DIAGNOSIS — Z96652 Presence of left artificial knee joint: Secondary | ICD-10-CM | POA: Diagnosis present

## 2023-11-17 LAB — URINALYSIS, ROUTINE W REFLEX MICROSCOPIC
Bilirubin Urine: NEGATIVE
Glucose, UA: NEGATIVE mg/dL
Ketones, ur: NEGATIVE mg/dL
Leukocytes,Ua: NEGATIVE
Nitrite: NEGATIVE
Protein, ur: NEGATIVE mg/dL
RBC / HPF: >50 RBC/hpf (ref 0–5)
Specific Gravity, Urine: 1.024 (ref 1.005–1.030)
Squamous Epithelial / HPF: 0 /[HPF] (ref 0–5)
pH: 5 (ref 5.0–8.0)

## 2023-11-17 LAB — COMPREHENSIVE METABOLIC PANEL
ALT: 25 U/L (ref 0–44)
AST: 45 U/L — ABNORMAL HIGH (ref 15–41)
Albumin: 2.6 g/dL — ABNORMAL LOW (ref 3.5–5.0)
Alkaline Phosphatase: 97 U/L (ref 38–126)
Anion gap: 9 (ref 5–15)
BUN: 10 mg/dL (ref 8–23)
CO2: 24 mmol/L (ref 22–32)
Calcium: 8 mg/dL — ABNORMAL LOW (ref 8.9–10.3)
Chloride: 104 mmol/L (ref 98–111)
Creatinine, Ser: 0.66 mg/dL (ref 0.61–1.24)
GFR, Estimated: >60 mL/min (ref >60)
Glucose, Bld: 130 mg/dL — ABNORMAL HIGH (ref 70–99)
Potassium: 3.4 mmol/L — ABNORMAL LOW (ref 3.5–5.1)
Sodium: 137 mmol/L (ref 135–145)
Total Bilirubin: 1.1 mg/dL (ref 0.0–1.2)
Total Protein: 6.2 g/dL — ABNORMAL LOW (ref 6.5–8.1)

## 2023-11-17 LAB — CBC WITH DIFFERENTIAL/PLATELET
Abs Immature Granulocytes: 0.04 10*3/uL (ref 0.00–0.07)
Basophils Absolute: 0 10*3/uL (ref 0.0–0.1)
Basophils Relative: 0 %
Eosinophils Absolute: 0 10*3/uL (ref 0.0–0.5)
Eosinophils Relative: 0 %
HCT: 36.6 % — ABNORMAL LOW (ref 39.0–52.0)
Hemoglobin: 12 g/dL — ABNORMAL LOW (ref 13.0–17.0)
Immature Granulocytes: 1 %
Lymphocytes Relative: 10 %
Lymphs Abs: 0.6 10*3/uL — ABNORMAL LOW (ref 0.7–4.0)
MCH: 33.6 pg (ref 26.0–34.0)
MCHC: 32.8 g/dL (ref 30.0–36.0)
MCV: 102.5 fL — ABNORMAL HIGH (ref 80.0–100.0)
Monocytes Absolute: 0.5 10*3/uL (ref 0.1–1.0)
Monocytes Relative: 9 %
Neutro Abs: 4.8 10*3/uL (ref 1.7–7.7)
Neutrophils Relative %: 80 %
Platelets: 239 10*3/uL (ref 150–400)
RBC: 3.57 MIL/uL — ABNORMAL LOW (ref 4.22–5.81)
RDW: 19.5 % — ABNORMAL HIGH (ref 11.5–15.5)
WBC: 6 10*3/uL (ref 4.0–10.5)
nRBC: 0 % (ref 0.0–0.2)

## 2023-11-17 LAB — GLUCOSE, CAPILLARY: Glucose-Capillary: 97 mg/dL (ref 70–99)

## 2023-11-17 LAB — RESP PANEL BY RT-PCR (RSV, FLU A&B, COVID)  RVPGX2
Influenza A by PCR: NEGATIVE
Influenza B by PCR: NEGATIVE
Resp Syncytial Virus by PCR: NEGATIVE
SARS Coronavirus 2 by RT PCR: NEGATIVE

## 2023-11-17 LAB — PROCALCITONIN: Procalcitonin: <0.1 ng/mL

## 2023-11-17 LAB — BRAIN NATRIURETIC PEPTIDE: B Natriuretic Peptide: 172.3 pg/mL — ABNORMAL HIGH (ref 0.0–100.0)

## 2023-11-17 LAB — LACTIC ACID, PLASMA
Lactic Acid, Venous: 2.6 mmol/L (ref 0.5–1.9)
Lactic Acid, Venous: 2.6 mmol/L (ref 0.5–1.9)

## 2023-11-17 LAB — TROPONIN I (HIGH SENSITIVITY)
Troponin I (High Sensitivity): 33 ng/L — ABNORMAL HIGH (ref <18)
Troponin I (High Sensitivity): 28 ng/L — ABNORMAL HIGH (ref <18)

## 2023-11-17 MED ORDER — DAROLUTAMIDE 300 MG PO TABS
600.0000 mg | ORAL_TABLET | Freq: Two times a day (BID) | ORAL | Status: DC
Start: 2023-11-18 — End: 2023-12-08
  Administered 2023-11-18 – 2023-12-08 (×38): 600 mg via ORAL
  Filled 2023-11-17 (×44): qty 2

## 2023-11-17 MED ORDER — ALBUTEROL SULFATE (2.5 MG/3ML) 0.083% IN NEBU
INHALATION_SOLUTION | RESPIRATORY_TRACT | Status: AC
  Filled 2023-11-17: qty 3

## 2023-11-17 MED ORDER — IPRATROPIUM-ALBUTEROL 0.5-2.5 (3) MG/3ML IN SOLN
3.0000 mL | RESPIRATORY_TRACT | Status: DC | PRN
Start: 2023-11-17 — End: 2023-11-18
  Administered 2023-11-17: 3 mL via RESPIRATORY_TRACT
  Filled 2023-11-17: qty 3

## 2023-11-17 MED ORDER — SODIUM CHLORIDE 0.9 % IV SOLN
2.0000 g | Freq: Once | INTRAVENOUS | Status: AC
  Administered 2023-11-17: 2 g via INTRAVENOUS
  Filled 2023-11-17: qty 12.5

## 2023-11-17 MED ORDER — ONDANSETRON HCL 4 MG PO TABS
4.0000 mg | ORAL_TABLET | Freq: Four times a day (QID) | ORAL | Status: DC | PRN
Start: 2023-11-17 — End: 2023-12-08

## 2023-11-17 MED ORDER — ONDANSETRON HCL 4 MG/2ML IJ SOLN
4.0000 mg | Freq: Four times a day (QID) | INTRAMUSCULAR | Status: DC | PRN
Start: 2023-11-17 — End: 2023-12-08
  Filled 2023-11-17: qty 2

## 2023-11-17 MED ORDER — VANCOMYCIN HCL 1750 MG/350ML IV SOLN
1750.0000 mg | Freq: Once | INTRAVENOUS | Status: AC
  Administered 2023-11-17: 1750 mg via INTRAVENOUS
  Filled 2023-11-17: qty 350

## 2023-11-17 MED ORDER — SODIUM CHLORIDE 0.9 % IV BOLUS (SEPSIS)
1000.0000 mL | Freq: Once | INTRAVENOUS | Status: AC
  Administered 2023-11-17: 1000 mL via INTRAVENOUS

## 2023-11-17 MED ORDER — ENOXAPARIN SODIUM 40 MG/0.4ML IJ SOSY
40.0000 mg | PREFILLED_SYRINGE | INTRAMUSCULAR | Status: DC
  Administered 2023-11-17 – 2023-12-07 (×21): 40 mg via SUBCUTANEOUS
  Filled 2023-11-17 (×22): qty 0.4

## 2023-11-17 MED ORDER — ACETAMINOPHEN 325 MG PO TABS
650.0000 mg | ORAL_TABLET | Freq: Once | ORAL | Status: AC
  Administered 2023-11-17: 650 mg via ORAL
  Filled 2023-11-17: qty 2

## 2023-11-17 MED ORDER — METHYLPREDNISOLONE SODIUM SUCC 125 MG IJ SOLR
125.0000 mg | Freq: Once | INTRAMUSCULAR | Status: AC
  Administered 2023-11-17: 125 mg via INTRAVENOUS
  Filled 2023-11-17: qty 2

## 2023-11-17 MED ORDER — SODIUM CHLORIDE 0.9 % IV SOLN
500.0000 mg | INTRAVENOUS | Status: AC
  Administered 2023-11-18: 500 mg via INTRAVENOUS
  Filled 2023-11-17: qty 5

## 2023-11-17 MED ORDER — IPRATROPIUM-ALBUTEROL 0.5-2.5 (3) MG/3ML IN SOLN
3.0000 mL | Freq: Once | RESPIRATORY_TRACT | Status: AC
  Administered 2023-11-17: 3 mL via RESPIRATORY_TRACT
  Filled 2023-11-17: qty 3

## 2023-11-17 MED ORDER — IOHEXOL 350 MG/ML SOLN
75.0000 mL | Freq: Once | INTRAVENOUS | Status: AC | PRN
  Administered 2023-11-17: 75 mL via INTRAVENOUS

## 2023-11-17 MED ORDER — PREDNISONE 20 MG PO TABS: 40.0000 mg | ORAL_TABLET | Freq: Every day | ORAL | Status: DC

## 2023-11-17 MED ORDER — LACTATED RINGERS IV BOLUS (SEPSIS)
1500.0000 mL | Freq: Once | INTRAVENOUS | Status: AC
  Administered 2023-11-17: 1500 mL via INTRAVENOUS

## 2023-11-17 MED ORDER — AZITHROMYCIN 250 MG PO TABS: 500.0000 mg | ORAL_TABLET | Freq: Every day | ORAL | Status: DC

## 2023-11-17 MED ORDER — LACTATED RINGERS IV SOLN: INTRAVENOUS | Status: DC

## 2023-11-17 MED ORDER — SODIUM CHLORIDE 0.9 % IV SOLN: INTRAVENOUS | Status: DC

## 2023-11-17 NOTE — Consult Note (Addendum)
 Consultation Note Date: 11/17/2023 at 1415  Patient Name: Steven Elliott  DOB: September 26, 1929  MRN: [id number]  Age / Sex: 89 y.o., male  PCP: [facility] Referring Physician: Hannah Beat, MD  HPI/Patient Profile: 89 y.o. male  with past medical history of dementia, CKD, COPD, hyponatremia, prostate cancer admitted on 11/17/2023 with complaints of shortness of breath and generalized weakness.  Patient is being treated for acute respiratory failure with hypoxia, COPD exacerbation, cellulitis, and weakness.  Radiograph of chest revealed COPD with bibasilar atelectasis/infiltrates.  CT of chest revealed no evidence of pulmonary embolism, small bilateral pleural effusions (left greater than right) with associated bilateral lower lobe atelectasis, aortic atherosclerosis, and emphysema.  PMT was consulted to support patient and family with goals of care conversations.   Clinical Assessment and Goals of Care: Extensive chart review completed prior to meeting patient including labs, vital signs, imaging, progress notes, orders, and available advanced directive documents from current and previous encounters. I then met with patient, [name] in person, and spoke with [name] via FaceTime to discuss diagnosis prognosis, GOC, EOL wishes, disposition and options.  I introduced Palliative Medicine as specialized medical care for people living with serious illness. It focuses on providing relief from the symptoms and stress of a serious illness. The goal is to improve quality of life for both the patient and the family.  Daughter Steven Elliott shares she was introduced to palliative medicine during patient's previous hospitalization (December 18-January 6) but was unable to complete goals of care discussions at this time.  She wishes she could be here in person but has influenza A.  She shares concern  that she feels very guilty if she gave her father influenza.  Reviewed that respiratory panel was negative for influenza, RSV, and COVID, but that patient has been retested and results are still pending.  We discussed a brief life review of the patient.  Patient lost his wife around the same time that patient's daughter lost her husband.  They moved in together when COVID-19 pandemic began in March 2020.  Steven Elliott lives with patient, takes him to all his doctors appointments, and provides all help with ADLs.  As far as functional nutritional status PTA, family endorses patient had a healthy appetite and no difficulty swallowing.  Given his T11 fracture he has had some mobility challenges.  However, with use of brace, standby assistance, and wheelchair, patient is able to move from bed to bathroom and other rooms in the house without difficulty.  We discussed patient's current illness - COPD exacerbation, potential for respiratory infection (pending results) and what it means in the larger context of patient's on-going co-morbidities - dementia.    Counseled with family that dementia is a chronic, progressive, and irreversible disease that is often exacerbated by acute illnesses and hospitalizations.  Given patient's previous hospitalizations, and recent stay in rehab, we reviewed his overall functional, nutritional, and cognitive status is significantly different from his baseline.  Reviewed these are significant indicators for patient's overall  prognosis.  I attempted to elicit values and goals of care important to the patient.  Patient shares he "does not know what is going on".  He is able to tell me his name, recognizes his daughters.  However, he is unable to participate in medical decision making or goals of care discussion at this time.  He lacks capacity given his underlying diagnosis of dementia. Daughter at bedside shares he has lacked ability to make decisions for himself since 2021.  Given  patient cannot make decisions for himself, I discussed patient's next of kin decision maker.  Patient's daughter Steven Elliott endorses that patient has created an HCPOA naming her as the primary decision maker.  However, patient vocalizes that both of his daughters jointly make decisions for him.  Patient's daughter Steven Elliott at bedside shares she always believed that it was a joint effort.  I advised daughters to provide documentation so that patient's next of kin decision maker can be appropriately clarified.  Patient's daughter Steven Elliott shares concern that patient was a DNR at his previous facility.  I highlighted that patient remains a full code at the time.  I expressed that there is no urgency given that patient is medically stable at the moment.  However, discussing patient's boundaries of medical care and advance is important.  They both shared understanding.  Both daughters believe that patient would never want to be placed on a ventilator.  Full code, DNR with limited interventions, DNR with full interventions discussed in detail.  Family shares they would like to review patient's HCPOA paperwork prior to making any changes to plan of care.  Encouraged patient/family to consider DNR/DNI status understanding evidenced based poor outcomes in similar hospitalized patients, as the cause of the arrest is likely associated with chronic/terminal disease rather than a reversible acute cardio-pulmonary event.    Full code remains.  Symptoms assessed.  Patient denies pain, shortness of breath, headache, or other acute issues at this time.  He shares that if anything starts to hurt him then he will let us know.  Discussed nonverbal signs of discomfort with daughter at bedside.  She shares that she will notify nursing staff should she notice any signs or symptoms of acute issues with patient.  Discussed with patient/family the importance of continued conversation with family and the medical providers regarding overall  plan of care and treatment options, ensuring decisions are within the context of the patient's values and GOCs.    No change to plan of care at this time.  Daughters wish to remain engaged with palliative services and to continue goals of care discussions.  I plan to follow-up with patient/family tomorrow.  Of note, family expresses concern that patient was transferred to a semiprivate room during his previous hospitalization.  They are requesting that he be transferred out of the ED only to a private room.  During my visit, patient received a private room assignment.  Family is appreciative of this private room assignment.  Primary Decision Maker NEXT OF KIN  Physical Exam Vitals reviewed.  Constitutional:      General: He is not in acute distress.    Appearance: He is normal weight. He is not ill-appearing.  Eyes:     Pupils: Pupils are equal, round, and reactive to light.  Cardiovascular:     Rate and Rhythm: Normal rate.  Pulmonary:     Effort: Pulmonary effort is normal.     Comments: 4L [place] Skin:    General: Skin is warm and dry.  Coloration: Skin is pale.  Neurological:     Mental Status: He is alert.     Comments: Oriented to self, recognizes his daughters     Palliative Assessment/Data: 40%     Thank you for this consult. Palliative medicine will continue to follow and assist holistically.   Time Total: 75 minutes  Time spent includes: Detailed review of medical records (labs, imaging, vital signs), medically appropriate exam (mental status, respiratory, cardiac, skin), discussed with treatment team, counseling and educating patient, family and staff, documenting clinical information, medication management and coordination of care.  Signed by: Georgiann Cocker, DNP, FNP-BC Palliative Medicine   Please contact Palliative Medicine Team providers via [facility] for questions and concerns.

## 2023-11-17 NOTE — Progress Notes (Signed)
 CODE SEPSIS - PHARMACY COMMUNICATION  **Broad Spectrum Antibiotics should be administered within 1 hour of Sepsis diagnosis**  Time Code Sepsis Called/Page Received:  2/26 @ 0258  Antibiotics Ordered: Cefepime, Vancomycin   Time of 1st antibiotic administration: Cefepime 2 gm IV x 1on 2/26 @ 0255   Additional action taken by pharmacy:   If necessary, Name of Provider/Nurse Contacted:     Kule Gascoigne D ,PharmD Clinical Pharmacist  11/17/2023  3:07 AM

## 2023-11-17 NOTE — Progress Notes (Signed)
 ED Pharmacy Antibiotic Sign Off An antibiotic consult was received from an ED provider for Vancomycin per pharmacy dosing for sepsis. A chart review was completed to assess appropriateness.   The following one time order(s) were placed:  Vancomycin 1750 mg IV X 1.   Further antibiotic and/or antibiotic pharmacy consults should be ordered by the admitting provider if indicated.   Thank you for allowing pharmacy to be a part of this patient's care.   Scherrie Gerlach, [facility]  Clinical Pharmacist 11/17/23 3:06 AM

## 2023-11-17 NOTE — Assessment & Plan Note (Signed)
 Original dx assd with 08/2023 admission  S/p course of inpt rehab  Cont TLSO brace through 11/2023

## 2023-11-17 NOTE — H&P (Addendum)
 History and Physical    Patient: Steven Elliott:[id number] DOB: 07/27/1930 DOA: 11/17/2023 DOS: the patient was seen and examined on 11/17/2023 PCP: [facility]  Patient coming from: Home  Chief Complaint:  Chief Complaint  Patient presents with   Shortness of Breath   HPI: Steven Elliott is a 89 y.o. male with medical history significant of dementia, chronic kidney disease, COPD, hyponatremia prostate cancer presenting with acute respiratory failure with hypoxia, COPD exacerbation, SIRS, weakness.  History primarily from patient's daughter in the setting of decompensated respiratory failure.  Patient noted to have been found shortness of breath with generalized rhonchi at home by EMS.  Patient noted to be sick x 3 days with URI symptoms.  Positive sick contact with similar symptoms pending evaluation today.  No fevers or chills.  Noted recent admission December 18 through January 6 for issues including closed wedge compression fracture of the T11 vertebrae as well as aspiration pneumonia.  Patient otherwise stable inpatient rehab course per the daughter.  No reported falls no worsening weakness.  No abdominal pain.  No reported nausea or vomiting.  Symptoms had sudden onset.  No reported diarrhea.  Per the daughter, patient does not know how to properly use inhalers associate with COPD at home. Presented to the ER afebrile, heart rate 100s, respirations into the mid 20s, BP with 80s to 120s over 50s to 70s.  Satting 87% on room air, transition to 4 L nasal cannula to keep O2 sats greater than 98%.  White count 6, hemoglobin 12, platelets 239, lactate 2.6.  Troponin 28-30.  COVID flu and RSV negative.  CTA of the chest negative for PE or any focal pneumonia. Review of Systems: As mentioned in the history of present illness. All other systems reviewed and are negative. 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    Anosmia    BPH (benign prostatic hyperplasia)    COPD (chronic obstructive pulmonary disease) (HCC)    Diverticulosis    Emphysema (subcutaneous) (surgical) resulting from a procedure    severe by CT 03/20/16   GERD (gastroesophageal reflux disease)    History of bilateral inguinal hernias 02/2015   Hypertension    Peripheral vascular disease (HCC) 02/2015   heavily calcified but patent bil iliac arteries by CTA 03/20/16.   Past Surgical History:  Procedure Laterality Date   APPENDECTOMY     CHOLECYSTECTOMY     COLONOSCOPY  ~ 2007   at [facility]   SKIN CANCER EXCISION     TOTAL KNEE ARTHROPLASTY Left 2014   Social History:  reports that he has quit smoking. His smoking use included cigarettes and cigars. He has never used smokeless tobacco. He reports current alcohol use. He reports that he does not use drugs.  Allergies  Allergen Reactions   Procaine Anaphylaxis   Ciprofloxacin Other (See Comments) and Nausea And Vomiting    Malaise, Rigor   Hydrocodone     Other reaction(s): Rash, Hives, Heart palpitations ()   Hydrocodone-Acetaminophen Itching    Watery eyes   Lisinopril Other (See Comments)   Morphine Other (See Comments)    Daughter states vomiting   Omeprazole Other (See Comments)    GI reaction   Penicillins Other (See Comments)   Rabeprazole Other (See Comments)    GI reaction  Tramadol Other (See Comments)    Hallucinations     Family History  Problem Relation Age of Onset   Breast cancer Mother    Pancreatitis Mother    Breast cancer Sister     Prior to Admission medications   Medication Sig Start Date End Date Taking? Authorizing Provider  acetaminophen (TYLENOL) 500 MG tablet Take 500 mg by mouth every 6 (six) hours as needed.    [provider]  albuterol (PROVENTIL HFA;VENTOLIN HFA) 108 (90 Base) MCG/ACT inhaler Inhale 2 puffs into the lungs every 6 (six) hours  as needed for wheezing or shortness of breath. Patient not taking: Reported on 10/07/2023    [provider]  Calcium Carb-Cholecalciferol 600-10 MG-MCG TABS Take 1 tablet by mouth 2 (two) times daily. Patient not taking: Reported on 10/07/2023 02/25/23   [provider]  Calcium Carbonate-Vitamin D (CALCIUM-D) 600-400 MG-UNIT TABS Take 1 tablet by mouth 2 (two) times daily.    [provider]  cetirizine (ZYRTEC) 5 MG tablet Take 1 tablet (5 mg total) by mouth daily. 09/13/20   Wallis Bamberg, PA-C  clotrimazole (LOTRIMIN) 1 % cream Apply 1 Application topically 2 (two) times daily. 06/04/22   Vaillancourt, Lelon Mast, PA-C  cyclobenzaprine (FLEXERIL) 5 MG tablet Take 5 mg by mouth 3 (three) times daily as needed for muscle spasms.    [provider]  darolutamide (NUBEQA) 300 MG tablet Take 600 mg by mouth 2 (two) times daily with a meal. 06/30/23   [provider]  DULoxetine (CYMBALTA) 20 MG capsule Take 20 mg by mouth 2 (two) times daily.    [provider]  famotidine (PEPCID) 20 MG tablet Take 1 tablet (20 mg total) by mouth at bedtime. 05/22/22   Alford Highland, MD  feeding supplement (ENSURE ENLIVE / ENSURE PLUS) LIQD Take 237 mLs by mouth 3 (three) times daily between meals. Patient not taking: Reported on 10/07/2023 05/22/22   Alford Highland, MD  fluticasone [facility]) 50 MCG/ACT nasal spray Place 1 spray into both nostrils daily.    [provider]  gabapentin (NEURONTIN) 100 MG capsule Take 1 capsule (100 mg total) by mouth at bedtime. 05/22/22   Alford Highland, MD  galantamine (RAZADYNE ER) 16 MG 24 hr capsule Take 1 capsule (16 mg total) by mouth daily with breakfast. Patient taking differently: Take 8 mg by mouth daily with breakfast. 05/23/22   Alford Highland, MD  ipratropium (ATROVENT) 0.06 % nasal spray Place 2 sprays into both nostrils 2 (two) times daily.    [provider]  lidocaine (LIDODERM) 5 % Place 1 patch onto  the skin daily. Remove & Discard patch within 12 hours or as directed by MD 09/29/23   Gloris Manchester, MD  magnesium oxide (MAG-OX) 400 (240 Mg) MG tablet Take 400 mg by mouth daily.    [provider]  memantine (NAMENDA) 10 MG tablet Take 10 mg by mouth 2 (two) times daily.    [provider]  Multiple Vitamin (MULTIVITAMIN WITH MINERALS) TABS tablet Take 1 tablet by mouth daily. Patient not taking: Reported on 10/07/2023    [provider]  olopatadine (PATANOL) 0.1 % ophthalmic solution Place 1 drop into both eyes 2 (two) times daily. Patient not taking: Reported on 10/07/2023 07/28/23   [provider]  tamsulosin (FLOMAX) 0.4 MG CAPS capsule Take 0.4 mg by mouth at bedtime.    [provider]  traZODone (DESYREL) 50 MG tablet Take 1 tablet (50 mg total) by mouth at  bedtime. Patient not taking: Reported on 10/07/2023 05/22/22   Alford Highland, MD  vitamin B-12 (CYANOCOBALAMIN) 500 MCG tablet Take 1 tablet by mouth daily. 11/06/21   [provider]    Physical Exam: Vitals:   11/17/23 0618 11/17/23 0620 11/17/23 0650 11/17/23 0821  BP: (!) 96/53 (!) 96/53 93/64 (!) 81/69  Pulse: (!) 110 (!) 108 (!) 107 (!) 107  Resp: 18 17 15 17   Temp: 98.4 F (36.9 C)   97.9 F (36.6 C)  TempSrc: Oral   Axillary  SpO2: 99% 98% 98% 97%  Weight:      Height:       Physical Exam Constitutional:      Appearance: He is normal weight.  HENT:     Head: Normocephalic and atraumatic.     Nose: Nose normal.     Mouth/Throat:     Mouth: Mucous membranes are moist.  Eyes:     Pupils: Pupils are equal, round, and reactive to light.  Cardiovascular:     Rate and Rhythm: Normal rate and regular rhythm.  Pulmonary:     Effort: Pulmonary effort is normal.     Breath sounds: Wheezing present.  Abdominal:     General: Bowel sounds are normal.  Musculoskeletal:        General: Normal range of motion.  Skin:    General: Skin is warm.  Neurological:      Mental Status: Mental status is at baseline.  Psychiatric:        Mood and Affect: Mood normal.     Data Reviewed:  There are no new results to review at this time.  CT Angio Chest PE W and/or Wo Contrast CLINICAL DATA:  Shortness of breath, flu like symptoms x3 days  EXAM: CT ANGIOGRAPHY CHEST WITH CONTRAST  TECHNIQUE: Multidetector CT imaging of the chest was performed using the standard protocol during bolus administration of intravenous contrast. Multiplanar CT image reconstructions and MIPs were obtained to evaluate the vascular anatomy.  RADIATION DOSE REDUCTION: This exam was performed according to the departmental dose-optimization program which includes automated exposure control, adjustment of the mA and/or kV according to patient size and/or use of iterative reconstruction technique.  CONTRAST:  75mL OMNIPAQUE IOHEXOL 350 MG/ML SOLN  COMPARISON:  CT chest dated 09/28/2023  FINDINGS: Cardiovascular: Satisfactory opacification of the bilateral pulmonary arteries to the segmental level. No evidence of pulmonary embolism.  Although not tailored for evaluation of the thoracic aorta, there is no evidence of thoracic aortic aneurysm or dissection. Atherosclerotic calcifications of the aortic arch.  The heart is normal in size.  No pericardial effusion.  Mild three-vessel coronary atherosclerosis.  Mediastinum/Nodes: No suspicious mediastinal lymphadenopathy.  Visualized thyroid is unremarkable.  Lungs/Pleura: Small bilateral pleural effusions, left greater than right. Associated bilateral lower lobe atelectasis.  Severe centrilobular and paraseptal emphysematous changes, upper lung predominant.  No suspicious pulmonary nodules.  No focal consolidation.  No pneumothorax.  Upper Abdomen: Visualized upper abdomen is notable for prior cholecystectomy, vascular calcifications, and an aortic stent.  Musculoskeletal: Moderate compression fracture deformity  at T11, chronic. Severe multilevel degenerative changes. Spinal canal is patent.  Review of the MIP images confirms the above findings.  IMPRESSION: No evidence of pulmonary embolism.  Small bilateral pleural effusions, left greater than right. Associated bilateral lower lobe atelectasis.  Aortic Atherosclerosis (ICD10-I70.0) and Emphysema (ICD10-J43.9).  Electronically Signed   By: Charline Bills M.D.   On: 11/17/2023 03:34 DG Chest Portable 1 View CLINICAL DATA:  Hypoxia, shortness  of breath  EXAM: PORTABLE CHEST 1 VIEW  COMPARISON:  09/13/2023  FINDINGS: There is hyperinflation of the lungs compatible with COPD. Cardiomegaly. Bibasilar atelectasis or infiltrates. No effusions or acute bony abnormality.  IMPRESSION: COPD.  Bibasilar atelectasis or infiltrates.  Electronically Signed   By: Charlett Nose M.D.   On: 11/17/2023 02:24  Lab Results  Component Value Date   WBC 6.0 11/17/2023   HGB 12.0 (L) 11/17/2023   HCT 36.6 (L) 11/17/2023   MCV 102.5 (H) 11/17/2023   PLT 239 11/17/2023   Last metabolic panel Lab Results  Component Value Date   GLUCOSE 130 (H) 11/17/2023   NA 137 11/17/2023   K 3.4 (L) 11/17/2023   CL 104 11/17/2023   CO2 24 11/17/2023   BUN 10 11/17/2023   CREATININE 0.66 11/17/2023   GFRNONAA >60 11/17/2023   CALCIUM 8.0 (L) 11/17/2023   PROT 6.2 (L) 11/17/2023   ALBUMIN 2.6 (L) 11/17/2023   BILITOT 1.1 11/17/2023   ALKPHOS 97 11/17/2023   AST 45 (H) 11/17/2023   ALT 25 11/17/2023   ANIONGAP 9 11/17/2023    Assessment and Plan: Acute respiratory failure with hypoxia (HCC) COPD Exacerbation  Decompensated respiratory failure requiring 4 L nasal cannula in the setting of baseline COPD with active exacerbation Chest imaging negative for any focal infection at present IV Solu-Medrol DuoNebs IV azithromycin Continue supplemental oxygen Expanded resp panel  Monitor  SIRS (systemic inflammatory response syndrome)  (HCC) Initially met SIRS criteria with heart rate 100s, respirations into the upper 20s Systolic pressures 80s to 120s White count 6 No overt infectious process noted at present though with active COPD exacerbation Lactate 2.6 Panculture LR MIVF as tolerated  Monitor   Orthostatic hypotension SBP 80s-120s on presentation which appears to be fairly chronic  MIVF  Monitor   Dementia (HCC) Mild lethargy at present  Gets confused and agitated on and off.  Avoid opiates, sedatives as he is high risk for confusion, falls Delirium precautions.  Elevated troponin Trop in 30s on presentation No active CP EKG stable   Suspect minimal heart strain in setting of acute resp failure and COPD Cont home regimen  Asa Monitor  Closed wedge compression fracture of T11 vertebra (HCC) Original dx assd with 08/2023 admission  S/p course of inpt rehab  Cont TLSO brace through 11/2023   Essential (primary) hypertension Lower limit normal blood pressure Hold BP regimen Monitor  Prostate cancer metastatic to bone (HCC) Cont nubeqa    Greater than 50% was spent in counseling and coordination of care with patient Total encounter time 80 minutes or more    Advance Care Planning:   Code Status: Full Code   Consults: Palliative Care to discuss goals of care with family   Family Communication: Daughter at the bedside   Severity of Illness: The appropriate patient status for this patient is INPATIENT. Inpatient status is judged to be reasonable and necessary in order to provide the required intensity of service to ensure the patient's safety. The patient's presenting symptoms, physical exam findings, and initial radiographic and laboratory data in the context of their chronic comorbidities is felt to place them at high risk for further clinical deterioration. Furthermore, it is not anticipated that the patient will be medically stable for discharge from the hospital within 2 midnights of  admission.   * I certify that at the point of admission it is my clinical judgment that the patient will require inpatient hospital care spanning beyond 2 midnights from  the point of admission due to high intensity of service, high risk for further deterioration and high frequency of surveillance required.*  Author: Floydene Flock, MD 11/17/2023 9:47 AM  For on call review [email or web address].

## 2023-11-17 NOTE — ED Notes (Signed)
 Dr. Elesa Massed aware of La of 2.6

## 2023-11-17 NOTE — Progress Notes (Signed)
 MEWS Progress Note  Patient Details Name: Steven Elliott MRN: [id number] DOB: 20-Mar-1930 Today's Date: 11/17/2023   MEWS Flowsheet Documentation:  Assess: MEWS Score Temp: (!) 100.5 F (38.1 C) BP: (!) 167/66 MAP (mmHg): 91 Pulse Rate: (!) 117 ECG Heart Rate: (!) 108 Resp: (!) 30 Level of Consciousness: Alert SpO2: 93 % O2 Device: Nasal Cannula Patient Activity (if Appropriate): In bed O2 Flow Rate (L/min): 2.5 L/min Assess: MEWS Score MEWS Temp: 1 MEWS Systolic: 0 MEWS Pulse: 2 MEWS RR: 2 MEWS LOC: 0 MEWS Score: 5 MEWS Score Color: Red Assess: SIRS CRITERIA SIRS Temperature : 0 SIRS Respirations : 1 SIRS Pulse: 1 SIRS WBC: 0 SIRS Score Sum : 2 Assess: if the MEWS score is Yellow or Red Were vital signs accurate and taken at a resting state?: Yes Does the patient meet 2 or more of the SIRS criteria?: Yes Does the patient have a confirmed or suspected source of infection?: Yes MEWS guidelines implemented : Yes, red Treat MEWS Interventions: Considered administering scheduled or prn medications/treatments as ordered Take Vital Signs Increase Vital Sign Frequency : Red: Q1hr x2, continue Q4hrs until patient remains green for 12hrs Escalate MEWS: Escalate: Red: Discuss with charge nurse and notify provider. Consider notifying RRT. If remains red for 2 hours consider need for higher level of care Notify: Charge Nurse/RN Name of Charge Nurse/RN Notified: Annice Pih, RN Provider Notification Provider Name/Title: Manuela Schwartz, NP Date Provider Notified: 11/17/23 Time Provider Notified: 2231 Method of Notification: Page Notification Reason: Change in status Provider response: See new orders Date of Provider Response: 11/17/23 Time of Provider Response: 2231      Ernest Mallick 11/17/2023, 10:35 PM

## 2023-11-17 NOTE — Assessment & Plan Note (Addendum)
 Initially met SIRS criteria with heart rate 100s, respirations into the upper 20s Systolic pressures 80s to 120s White count 6 No overt infectious process noted at present though with active COPD exacerbation Lactate 2.6 Panculture LR MIVF as tolerated  Monitor

## 2023-11-17 NOTE — Progress Notes (Signed)
       CROSS COVER NOTE  NAME: Steven Elliott MRN: [id number] DOB : [birth year] ATTENDING PHYSICIAN: Floydene Flock, MD    Date of Service   11/17/2023   HPI/Events of Note   Message received from RN Patient has full body fine tremor. Patient's daughter states he never has this tremor, so it is new, also appears to be third spacing fluid under his elbows, which his daughter is concerned about   Interventions   Assessment/Plan: Patient has fine tremor, mostly noted at this time in left arm.  Tremor increases with activity.  Obvious signs of protein caloreie malnutrition with third spacing of fluids noted.  Albumin low 2.6 and total protein at 6.2 Daughter states tremor has been present since this morning, not new now.  When patient asked if he ever had these tremors before he stated "oh probably" Tremors likely from weakness given his debilitated state and acute illness      Steven Mesa NP [facility] Cross Cover 7pm-7am - check amion for availability Pager [phone]

## 2023-11-17 NOTE — Assessment & Plan Note (Signed)
 Cont nubeqa

## 2023-11-17 NOTE — Plan of Care (Addendum)
 Pt is admitted via bed from ED.Patient is alert and oriented X 2. He is in oxygen 2 lit/min via nasal cannula. He has a pressure sore on his back. Foam dressing done.  Problem: Education: Goal: Knowledge of General Education information will improve Description: Including pain rating scale, medication(s)/side effects and non-pharmacologic comfort measures Outcome: Progressing   Problem: Health Behavior/Discharge Planning: Goal: Ability to manage health-related needs will improve Outcome: Progressing   Problem: Clinical Measurements: Goal: Ability to maintain clinical measurements within normal limits will improve Outcome: Progressing Goal: Will remain free from infection Outcome: Progressing Goal: Diagnostic test results will improve Outcome: Progressing Goal: Respiratory complications will improve Outcome: Progressing Goal: Cardiovascular complication will be avoided Outcome: Progressing   Problem: Nutrition: Goal: Adequate nutrition will be maintained Outcome: Progressing   Problem: Coping: Goal: Level of anxiety will decrease Outcome: Progressing

## 2023-11-17 NOTE — ED Notes (Signed)
 Pt cleaned of bowel incontinence. Peri-care provided; pt placed in a new brief.

## 2023-11-17 NOTE — Assessment & Plan Note (Addendum)
 SBP 80s-120s on presentation which appears to be fairly chronic  MIVF  Monitor

## 2023-11-17 NOTE — ED Notes (Signed)
 Called CCMD to place patient on monitor

## 2023-11-17 NOTE — Progress Notes (Signed)
 MEWS Progress Note  Patient Details Name: Steven Elliott MRN: [id number] DOB: 07-19-1930 Today's Date: 11/17/2023   MEWS Flowsheet Documentation:  Assess: MEWS Score Temp: 98.3 F (36.8 C) BP: 136/71 MAP (mmHg): 86 Pulse Rate: (!) 111 ECG Heart Rate: (!) 108 Resp: 18 Level of Consciousness: Alert SpO2: 95 % O2 Device: Nasal Cannula Patient Activity (if Appropriate): In bed O2 Flow Rate (L/min): 2 L/min Assess: MEWS Score MEWS Temp: 0 MEWS Systolic: 0 MEWS Pulse: 2 MEWS RR: 0 MEWS LOC: 0 MEWS Score: 2 MEWS Score Color: Yellow Assess: SIRS CRITERIA SIRS Temperature : 0 SIRS Respirations : 0 SIRS Pulse: 1 SIRS WBC: 0 SIRS Score Sum : 1 Assess: if the MEWS score is Yellow or Red Were vital signs accurate and taken at a resting state?: Yes Does the patient meet 2 or more of the SIRS criteria?: No MEWS guidelines implemented : Yes, yellow Treat MEWS Interventions: Considered administering scheduled or prn medications/treatments as ordered Take Vital Signs Increase Vital Sign Frequency : Yellow: Q2hr x1, continue Q4hrs until patient remains green for 12hrs Escalate MEWS: Escalate: Yellow: Discuss with charge nurse and consider notifying provider and/or RRT Notify: Charge Nurse/RN Name of Charge Nurse/RN Notified: Annice Pih, RN Provider Notification Provider Name/Title: Manuela Schwartz, NP Date Provider Notified: 11/17/23 Time Provider Notified: 2032 Method of Notification: Page Notification Reason: Other (Comment) (yellow MEWS d/t HR)      Ernest Mallick 11/17/2023, 8:33 PM

## 2023-11-17 NOTE — ED Notes (Addendum)
 This NT assisted Nurse Toni Amend with changing patient. Patient was wet. Patient has on a clean brief, a clean chux and a clean gown.

## 2023-11-17 NOTE — Assessment & Plan Note (Addendum)
 COPD Exacerbation  Decompensated respiratory failure requiring 4 L nasal cannula in the setting of baseline COPD with active exacerbation Chest imaging negative for any focal infection at present IV Solu-Medrol DuoNebs IV azithromycin Continue supplemental oxygen Expanded resp panel  Monitor

## 2023-11-17 NOTE — Assessment & Plan Note (Addendum)
 Trop in 30s on presentation No active CP EKG stable   Suspect minimal heart strain in setting of acute resp failure and COPD Cont home regimen  Asa Monitor

## 2023-11-17 NOTE — ED Provider Notes (Signed)
 [facility] Provider Note    Event Date/Time   First MD Initiated Contact with Patient 11/17/23 0118     (approximate)   History   Shortness of Breath   HPI  Steven Elliott is a 89 y.o. male with history of hypertension, COPD not on home oxygen who presents to the emergency department with cough, congestion shortness of breath today.  Was hypoxic to 87% on room air.  Does not wear oxygen chronically.  No fevers, chest pain.  No history of CHF.  No calf tenderness or calf swelling.  Daughter reports that she has been sick and thinks she passed something to him.  She states that he just got out of rehab after a fall with vertebral fractures.   History provided by patient, daughter, EMS.    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    Anosmia    BPH (benign prostatic hyperplasia)    COPD (chronic obstructive pulmonary disease) (HCC)    Diverticulosis    Emphysema (subcutaneous) (surgical) resulting from a procedure    severe by CT 03/20/16   GERD (gastroesophageal reflux disease)    History of bilateral inguinal hernias 02/2015   Hypertension    Peripheral vascular disease (HCC) 02/2015   heavily calcified but patent bil iliac arteries by CTA 03/20/16.    Past Surgical History:  Procedure Laterality Date   APPENDECTOMY     CHOLECYSTECTOMY     COLONOSCOPY  ~ 2007   at [facility]   SKIN CANCER EXCISION     TOTAL KNEE ARTHROPLASTY Left 2014    MEDICATIONS:  Prior to Admission medications   Medication Sig Start Date End Date Taking? Authorizing Provider  acetaminophen (TYLENOL) 500 MG tablet Take 500 mg by mouth every 6 (six) hours as needed.    [provider]  albuterol (PROVENTIL HFA;VENTOLIN HFA) 108 (90 Base) MCG/ACT inhaler Inhale 2 puffs into the lungs every 6 (six) hours as needed for  wheezing or shortness of breath. Patient not taking: Reported on 10/07/2023    [provider]  Calcium Carb-Cholecalciferol 600-10 MG-MCG TABS Take 1 tablet by mouth 2 (two) times daily. Patient not taking: Reported on 10/07/2023 02/25/23   [provider]  Calcium Carbonate-Vitamin D (CALCIUM-D) 600-400 MG-UNIT TABS Take 1 tablet by mouth 2 (two) times daily.    [provider]  cetirizine (ZYRTEC) 5 MG tablet Take 1 tablet (5 mg total) by mouth daily. 09/13/20   Wallis Bamberg, PA-C  clotrimazole (LOTRIMIN) 1 % cream Apply 1 Application topically 2 (two) times daily. 06/04/22   Vaillancourt, Lelon Mast, PA-C  cyclobenzaprine (FLEXERIL) 5 MG tablet Take 5 mg by mouth 3 (three) times daily as needed for muscle spasms.    [provider]  darolutamide (NUBEQA) 300 MG tablet Take 600 mg by mouth 2 (two) times daily with a meal. 06/30/23   [provider]  DULoxetine (CYMBALTA) 20 MG capsule Take 20 mg by mouth 2 (two) times daily.    [provider]  famotidine (PEPCID) 20 MG tablet Take 1 tablet (20 mg total) by mouth at bedtime. 05/22/22   Alford Highland, MD  feeding supplement (ENSURE ENLIVE / ENSURE PLUS) LIQD Take 237 mLs by mouth 3 (three) times daily between meals. Patient not taking: Reported on 10/07/2023 05/22/22   Alford Highland,  MD  fluticasone (FLONASE) 50 MCG/ACT nasal spray Place 1 spray into both nostrils daily.    [provider]  gabapentin (NEURONTIN) 100 MG capsule Take 1 capsule (100 mg total) by mouth at bedtime. 05/22/22   Alford Highland, MD  galantamine (RAZADYNE ER) 16 MG 24 hr capsule Take 1 capsule (16 mg total) by mouth daily with breakfast. Patient taking differently: Take 8 mg by mouth daily with breakfast. 05/23/22   Alford Highland, MD  ipratropium (ATROVENT) 0.06 % nasal spray Place 2 sprays into both nostrils 2 (two) times daily.    [provider]  lidocaine (LIDODERM) 5 % Place 1 patch onto the skin daily.  Remove & Discard patch within 12 hours or as directed by MD 09/29/23   Gloris Manchester, MD  magnesium oxide (MAG-OX) 400 (240 Mg) MG tablet Take 400 mg by mouth daily.    [provider]  memantine (NAMENDA) 10 MG tablet Take 10 mg by mouth 2 (two) times daily.    [provider]  Multiple Vitamin (MULTIVITAMIN WITH MINERALS) TABS tablet Take 1 tablet by mouth daily. Patient not taking: Reported on 10/07/2023    [provider]  olopatadine (PATANOL) 0.1 % ophthalmic solution Place 1 drop into both eyes 2 (two) times daily. Patient not taking: Reported on 10/07/2023 07/28/23   [provider]  tamsulosin (FLOMAX) 0.4 MG CAPS capsule Take 0.4 mg by mouth at bedtime.    [provider]  traZODone (DESYREL) 50 MG tablet Take 1 tablet (50 mg total) by mouth at bedtime. Patient not taking: Reported on 10/07/2023 05/22/22   Alford Highland, MD  vitamin B-12 (CYANOCOBALAMIN) 500 MCG tablet Take 1 tablet by mouth daily. 11/06/21   [provider]    Physical Exam   Triage Vital Signs: ED Triage Vitals  Encounter Vitals Group     BP 11/17/23 0103 103/68     Systolic BP Percentile --      Diastolic BP Percentile --      Pulse Rate 11/17/23 0103 87     Resp 11/17/23 0103 19     Temp 11/17/23 0103 98.5 F (36.9 C)     Temp Source 11/17/23 0103 Oral     SpO2 11/17/23 0059 (!) 87 %     Weight 11/17/23 0104 154 lb 15.7 oz (70.3 kg)     Height 11/17/23 0104 5\' 8"  (1.727 m)     Head Circumference --      Peak Flow --      Pain Score 11/17/23 0104 0     Pain Loc --      Pain Education --      Exclude from Growth Chart --     Most recent vital signs: Vitals:   11/17/23 0230 11/17/23 0300  BP: (!) 98/50 (!) 124/57  Pulse: (!) 107 (!) 107  Resp: (!) 21 (!) 22  Temp:    SpO2: 100% 100%    CONSTITUTIONAL: Alert, responds appropriately to questions.  Elderly, in no distress HEAD: Normocephalic, atraumatic EYES: Conjunctivae clear, pupils appear  equal, sclera nonicteric ENT: normal nose; moist mucous membranes NECK: Supple, normal ROM CARD: Regular and tachycardic; S1 and S2 appreciated RESP: Normal chest excursion without splinting or tachypnea; rhonchorous breath sounds, mild wheezing, diminished aeration at bases, no rales, hypoxic on room air ABD/GI: Non-distended; soft, non-tender, no rebound, no guarding, no peritoneal signs BACK: The back appears normal EXT: Normal ROM in all joints; no deformity noted, no edema, no calf tenderness  or calf swelling SKIN: Normal color for age and race; warm; no rash on exposed skin NEURO: Moves all extremities equally, normal speech PSYCH: The patient's mood and manner are appropriate.   ED Results / Procedures / Treatments   LABS: (all labs ordered are listed, but only abnormal results are displayed) Labs Reviewed  CBC WITH DIFFERENTIAL/PLATELET - Abnormal; Notable for the following components:      Result Value   RBC 3.57 (*)    Hemoglobin 12.0 (*)    HCT 36.6 (*)    MCV 102.5 (*)    RDW 19.5 (*)    Lymphs Abs 0.6 (*)    All other components within normal limits  COMPREHENSIVE METABOLIC PANEL - Abnormal; Notable for the following components:   Potassium 3.4 (*)    Glucose, Bld 130 (*)    Calcium 8.0 (*)    Total Protein 6.2 (*)    Albumin 2.6 (*)    AST 45 (*)    All other components within normal limits  LACTIC ACID, PLASMA - Abnormal; Notable for the following components:   Lactic Acid, Venous 2.6 (*)    All other components within normal limits  TROPONIN I (HIGH SENSITIVITY) - Abnormal; Notable for the following components:   Troponin I (High Sensitivity) 33 (*)    All other components within normal limits  RESP PANEL BY RT-PCR (RSV, FLU A&B, COVID)  RVPGX2  CULTURE, BLOOD (ROUTINE X 2)  CULTURE, BLOOD (ROUTINE X 2)  PROCALCITONIN  LACTIC ACID, PLASMA  TROPONIN I (HIGH SENSITIVITY)     EKG:  EKG Interpretation Date/Time:    Ventricular Rate:    PR Interval:     QRS Duration:    QT Interval:    QTC Calculation:   R Axis:      Text Interpretation:           RADIOLOGY: My personal review and interpretation of imaging: CTA chest shows no PE.  Patient has bilateral pleural effusions.  I have personally reviewed all radiology reports.   CT Angio Chest PE W and/or Wo Contrast Result Date: 11/17/2023 CLINICAL DATA:  Shortness of breath, flu like symptoms x3 days EXAM: CT ANGIOGRAPHY CHEST WITH CONTRAST TECHNIQUE: Multidetector CT imaging of the chest was performed using the standard protocol during bolus administration of intravenous contrast. Multiplanar CT image reconstructions and MIPs were obtained to evaluate the vascular anatomy. RADIATION DOSE REDUCTION: This exam was performed according to the departmental dose-optimization program which includes automated exposure control, adjustment of the mA and/or kV according to patient size and/or use of iterative reconstruction technique. CONTRAST:  75mL OMNIPAQUE IOHEXOL 350 MG/ML SOLN COMPARISON:  CT chest dated 09/28/2023 FINDINGS: Cardiovascular: Satisfactory opacification of the bilateral pulmonary arteries to the segmental level. No evidence of pulmonary embolism. Although not tailored for evaluation of the thoracic aorta, there is no evidence of thoracic aortic aneurysm or dissection. Atherosclerotic calcifications of the aortic arch. The heart is normal in size.  No pericardial effusion. Mild three-vessel coronary atherosclerosis. Mediastinum/Nodes: No suspicious mediastinal lymphadenopathy. Visualized thyroid is unremarkable. Lungs/Pleura: Small bilateral pleural effusions, left greater than right. Associated bilateral lower lobe atelectasis. Severe centrilobular and paraseptal emphysematous changes, upper lung predominant. No suspicious pulmonary nodules. No focal consolidation. No pneumothorax. Upper Abdomen: Visualized upper abdomen is notable for prior cholecystectomy, vascular calcifications, and an  aortic stent. Musculoskeletal: Moderate compression fracture deformity at T11, chronic. Severe multilevel degenerative changes. Spinal canal is patent. Review of the MIP images confirms the above findings. IMPRESSION: No  evidence of pulmonary embolism. Small bilateral pleural effusions, left greater than right. Associated bilateral lower lobe atelectasis. Aortic Atherosclerosis (ICD10-I70.0) and Emphysema (ICD10-J43.9). Electronically Signed   By: Charline Bills M.D.   On: 11/17/2023 03:34   DG Chest Portable 1 View Result Date: 11/17/2023 CLINICAL DATA:  Hypoxia, shortness of breath EXAM: PORTABLE CHEST 1 VIEW COMPARISON:  09/13/2023 FINDINGS: There is hyperinflation of the lungs compatible with COPD. Cardiomegaly. Bibasilar atelectasis or infiltrates. No effusions or acute bony abnormality. IMPRESSION: COPD. Bibasilar atelectasis or infiltrates. Electronically Signed   By: Charlett Nose M.D.   On: 11/17/2023 02:24     PROCEDURES:  Critical Care performed: Yes, see critical care procedure note(s)   CRITICAL CARE Performed by: Baxter Hire Merion Caton   Total critical care time: 40 minutes  Critical care time was exclusive of separately billable procedures and treating other patients.  Critical care was necessary to treat or prevent imminent or life-threatening deterioration.  Critical care was time spent personally by me on the following activities: development of treatment plan with patient and/or surrogate as well as nursing, discussions with consultants, evaluation of patient's response to treatment, examination of patient, obtaining history from patient or surrogate, ordering and performing treatments and interventions, ordering and review of laboratory studies, ordering and review of radiographic studies, pulse oximetry and re-evaluation of patient's condition.   Marland Kitchen1-3 Lead EKG Interpretation  Performed by: Teyon Odette, Layla Maw, DO Authorized by: Waylyn Tenbrink, Layla Maw, DO     Interpretation: abnormal      ECG rate:  107   ECG rate assessment: tachycardic     Rhythm: sinus tachycardia     Ectopy: none     Conduction: normal       IMPRESSION / MDM / ASSESSMENT AND PLAN / ED COURSE  I reviewed the triage vital signs and the nursing notes.    Patient here with shortness of breath with history of COPD.  Cough and congestion.  Daughter with similar symptoms.  Hypoxic on room air and does not wear oxygen chronically.  Also recently discharged from rehab after vertebral fractures.  The patient is on the cardiac monitor to evaluate for evidence of arrhythmia and/or significant heart rate changes.   DIFFERENTIAL DIAGNOSIS (includes but not limited to):   Pneumonia, viral URI, COPD exacerbation, PE, pneumothorax, ACS   Patient's presentation is most consistent with acute presentation with potential threat to life or bodily function.   PLAN: Will obtain labs, cultures, COVID and flu swab, chest x-ray.  Will give breathing treatments, Solu-Medrol.   MEDICATIONS GIVEN IN ED: Medications  lactated ringers infusion (has no administration in time range)  lactated ringers bolus 1,500 mL (1,500 mLs Intravenous New Bag/Given 11/17/23 0342)  vancomycin (VANCOREADY) IVPB 1750 mg/350 mL (has no administration in time range)  ipratropium-albuterol (DUONEB) 0.5-2.5 (3) MG/3ML nebulizer solution 3 mL (has no administration in time range)  ipratropium-albuterol (DUONEB) 0.5-2.5 (3) MG/3ML nebulizer solution 3 mL (3 mLs Nebulization Given 11/17/23 0208)  methylPREDNISolone sodium succinate (SOLU-MEDROL) 125 mg/2 mL injection 125 mg (125 mg Intravenous Given 11/17/23 0208)  sodium chloride 0.9 % bolus 1,000 mL (1,000 mLs Intravenous New Bag/Given 11/17/23 0249)  ceFEPIme (MAXIPIME) 2 g in sodium chloride 0.9 % 100 mL IVPB (2 g Intravenous New Bag/Given 11/17/23 0255)  iohexol (OMNIPAQUE) 350 MG/ML injection 75 mL (75 mLs Intravenous Contrast Given 11/17/23 0315)     ED COURSE: Patient's chest x-ray  reviewed and interpreted by myself and the radiologist and shows atelectasis versus infiltrates.  Given tachycardia,  tachypnea, I am concerned for possible PE as well given recent admission in January 2025.  Will obtain CTA of the chest.  Lactic acid level, blood cultures pending.  Will give IV fluids, antibiotics for HCAP.    CTA chest reviewed and interpreted by myself and the radiologist and shows no PE.  He has bilateral atelectasis but no infiltrate.  Also has small bilateral pleural effusions.  Suspect patient may have a viral URI that is causing a COPD exacerbation.  Will continue breathing treatments.  First lactic is elevated at 2.6 but this could be related to his respiratory failure and also albuterol use rather than sepsis but he is getting 30 mL/kg IV fluid bolus and broad-spectrum antibiotics.  Procalcitonin is negative.  Will discuss with hospitalist for admission.    CONSULTS:  Consulted and discussed patient's case with hospitalist, Dr. Arville Care.  I have recommended admission and consulting physician agrees and will place admission orders.  Patient (and family if present) agree with this plan.   I reviewed all nursing notes, vitals, pertinent previous records.  All labs, EKGs, imaging ordered have been independently reviewed and interpreted by myself.    OUTSIDE RECORDS REVIEWED: Reviewed recent admission.       FINAL CLINICAL IMPRESSION(S) / ED DIAGNOSES   Final diagnoses:  Acute respiratory failure with hypoxia (HCC)  COPD exacerbation (HCC)  Viral URI with cough     Rx / DC Orders   ED Discharge Orders     None        Note:  This document was prepared using Dragon voice recognition software and may include unintentional dictation errors.   Jaqualin Serpa, Layla Maw, DO 11/17/23 [phone]

## 2023-11-17 NOTE — ED Notes (Signed)
 Daughter reports that patient only has to wear TLSO brace when he is up moving around. TLSO brace removed when patient changed. Pt tolerated well. Pt denies any further needs at this time. Cb within reach. Daughter at bedside.

## 2023-11-17 NOTE — ED Triage Notes (Signed)
 BIBA from home. EMS was called for a lift assist. Upon their arrival they found patient to be short of breath and had rhonchi in all fields. Pt's daughter reported that she has been sick x 3 days with flu like symptoms. Pt has prostate CA with mets to bones and is currently in tx. Pt recently had a fall that resulted in 2 fractured vertebra and he is wearing a TLSO brace.

## 2023-11-17 NOTE — Assessment & Plan Note (Signed)
 Lower limit normal blood pressure Hold BP regimen Monitor

## 2023-11-17 NOTE — Assessment & Plan Note (Signed)
 Mild lethargy at present  Gets confused and agitated on and off.  Avoid opiates, sedatives as he is high risk for confusion, falls Delirium precautions.

## 2023-11-18 ENCOUNTER — Ambulatory Visit: Payer: No Typology Code available for payment source | Admitting: Neurosurgery

## 2023-11-18 ENCOUNTER — Inpatient Hospital Stay: Payer: No Typology Code available for payment source

## 2023-11-18 DIAGNOSIS — J9601 Acute respiratory failure with hypoxia: Secondary | ICD-10-CM | POA: Diagnosis not present

## 2023-11-18 DIAGNOSIS — J441 Chronic obstructive pulmonary disease with (acute) exacerbation: Secondary | ICD-10-CM | POA: Diagnosis not present

## 2023-11-18 DIAGNOSIS — Z515 Encounter for palliative care: Secondary | ICD-10-CM | POA: Diagnosis not present

## 2023-11-18 LAB — BLOOD GAS, VENOUS
Acid-base deficit: 0.7 mmol/L (ref 0.0–2.0)
Bicarbonate: 23.4 mmol/L (ref 20.0–28.0)
O2 Saturation: 98.4 %
Patient temperature: 37
pCO2, Ven: 36 mmHg — ABNORMAL LOW (ref 44–60)
pH, Ven: 7.42 (ref 7.25–7.43)
pO2, Ven: 79 mmHg — ABNORMAL HIGH (ref 32–45)

## 2023-11-18 LAB — COMPREHENSIVE METABOLIC PANEL
ALT: 22 U/L (ref 0–44)
AST: 39 U/L (ref 15–41)
Albumin: 2.1 g/dL — ABNORMAL LOW (ref 3.5–5.0)
Alkaline Phosphatase: 72 U/L (ref 38–126)
Anion gap: 8 (ref 5–15)
BUN: 11 mg/dL (ref 8–23)
CO2: 22 mmol/L (ref 22–32)
Calcium: 7 mg/dL — ABNORMAL LOW (ref 8.9–10.3)
Chloride: 104 mmol/L (ref 98–111)
Creatinine, Ser: 0.71 mg/dL (ref 0.61–1.24)
GFR, Estimated: >60 mL/min (ref >60)
Glucose, Bld: 96 mg/dL (ref 70–99)
Potassium: 3.3 mmol/L — ABNORMAL LOW (ref 3.5–5.1)
Sodium: 134 mmol/L — ABNORMAL LOW (ref 135–145)
Total Bilirubin: 0.6 mg/dL (ref 0.0–1.2)
Total Protein: 5.2 g/dL — ABNORMAL LOW (ref 6.5–8.1)

## 2023-11-18 LAB — GLUCOSE, CAPILLARY
Glucose-Capillary: 93 mg/dL (ref 70–99)
Glucose-Capillary: 106 mg/dL — ABNORMAL HIGH (ref 70–99)

## 2023-11-18 LAB — RESPIRATORY PANEL BY PCR

## 2023-11-18 LAB — MAGNESIUM: Magnesium: 1.8 mg/dL (ref 1.7–2.4)

## 2023-11-18 LAB — CBC
HCT: 30.3 % — ABNORMAL LOW (ref 39.0–52.0)
Hemoglobin: 10 g/dL — ABNORMAL LOW (ref 13.0–17.0)
MCH: 33.2 pg (ref 26.0–34.0)
MCHC: 33 g/dL (ref 30.0–36.0)
MCV: 100.7 fL — ABNORMAL HIGH (ref 80.0–100.0)
Platelets: 197 10*3/uL (ref 150–400)
RBC: 3.01 MIL/uL — ABNORMAL LOW (ref 4.22–5.81)
RDW: 19.4 % — ABNORMAL HIGH (ref 11.5–15.5)
WBC: 4.5 10*3/uL (ref 4.0–10.5)
nRBC: 0 % (ref 0.0–0.2)

## 2023-11-18 LAB — MRSA NEXT GEN BY PCR, NASAL: MRSA by PCR Next Gen: NOT DETECTED

## 2023-11-18 LAB — TSH: TSH: 2.487 u[IU]/mL (ref 0.350–4.500)

## 2023-11-18 LAB — LACTIC ACID, PLASMA: Lactic Acid, Venous: 1.2 mmol/L (ref 0.5–1.9)

## 2023-11-18 MED ORDER — CHLORHEXIDINE GLUCONATE CLOTH 2 % EX PADS
6.0000 | MEDICATED_PAD | Freq: Every day | CUTANEOUS | Status: DC
  Administered 2023-11-18 – 2023-11-19 (×2): 6 via TOPICAL

## 2023-11-18 MED ORDER — ORAL CARE MOUTH RINSE: 15.0000 mL | OROMUCOSAL | Status: DC | PRN

## 2023-11-18 MED ORDER — TAMSULOSIN HCL 0.4 MG PO CAPS
0.4000 mg | ORAL_CAPSULE | Freq: Every day | ORAL | Status: DC
  Administered 2023-11-18 – 2023-12-07 (×17): 0.4 mg via ORAL
  Filled 2023-11-18 (×17): qty 1

## 2023-11-18 MED ORDER — FUROSEMIDE 10 MG/ML IJ SOLN
40.0000 mg | Freq: Once | INTRAMUSCULAR | Status: AC
  Administered 2023-11-18: 40 mg via INTRAVENOUS
  Filled 2023-11-18: qty 4

## 2023-11-18 MED ORDER — MAGNESIUM SULFATE 2 GM/50ML IV SOLN
2.0000 g | Freq: Once | INTRAVENOUS | Status: AC
Start: 2023-11-18 — End: 2023-11-18
  Administered 2023-11-18: 2 g via INTRAVENOUS
  Filled 2023-11-18: qty 50

## 2023-11-18 MED ORDER — FUROSEMIDE 10 MG/ML IJ SOLN
40.0000 mg | INTRAMUSCULAR | Status: AC
  Administered 2023-11-18: 40 mg via INTRAVENOUS

## 2023-11-18 MED ORDER — OSELTAMIVIR PHOSPHATE 30 MG PO CAPS
30.0000 mg | ORAL_CAPSULE | Freq: Every day | ORAL | Status: DC
  Filled 2023-11-18: qty 1

## 2023-11-18 MED ORDER — ACETAMINOPHEN 325 MG PO TABS
650.0000 mg | ORAL_TABLET | Freq: Once | ORAL | Status: AC
  Administered 2023-11-18: 650 mg via ORAL
  Filled 2023-11-18: qty 2

## 2023-11-18 MED ORDER — OSELTAMIVIR PHOSPHATE 75 MG PO CAPS
75.0000 mg | ORAL_CAPSULE | Freq: Once | ORAL | Status: AC
  Administered 2023-11-18: 75 mg via ORAL
  Filled 2023-11-18: qty 1

## 2023-11-18 MED ORDER — OSELTAMIVIR PHOSPHATE 30 MG PO CAPS
30.0000 mg | ORAL_CAPSULE | Freq: Two times a day (BID) | ORAL | Status: AC
Start: 2023-11-18 — End: 2023-11-22
  Administered 2023-11-18 – 2023-11-22 (×8): 30 mg via ORAL
  Filled 2023-11-18 (×8): qty 1

## 2023-11-18 MED ORDER — ORAL CARE MOUTH RINSE: 15.0000 mL | OROMUCOSAL | Status: DC

## 2023-11-18 MED ORDER — FUROSEMIDE 10 MG/ML IJ SOLN
INTRAMUSCULAR | Status: AC
  Filled 2023-11-18: qty 4

## 2023-11-18 MED ORDER — MEMANTINE HCL 5 MG PO TABS
10.0000 mg | ORAL_TABLET | Freq: Two times a day (BID) | ORAL | Status: DC
  Administered 2023-11-18 – 2023-12-08 (×38): 10 mg via ORAL
  Filled 2023-11-18 (×3): qty 1
  Filled 2023-11-18: qty 2
  Filled 2023-11-18 (×2): qty 1
  Filled 2023-11-18: qty 2
  Filled 2023-11-18 (×3): qty 1
  Filled 2023-11-18 (×2): qty 2
  Filled 2023-11-18 (×3): qty 1
  Filled 2023-11-18 (×2): qty 2
  Filled 2023-11-18: qty 1
  Filled 2023-11-18: qty 2
  Filled 2023-11-18: qty 1
  Filled 2023-11-18: qty 2
  Filled 2023-11-18 (×9): qty 1
  Filled 2023-11-18 (×2): qty 2
  Filled 2023-11-18: qty 1
  Filled 2023-11-18 (×3): qty 2
  Filled 2023-11-18 (×3): qty 1

## 2023-11-18 MED ORDER — ENSURE ENLIVE PO LIQD
237.0000 mL | Freq: Two times a day (BID) | ORAL | Status: DC
  Administered 2023-11-18: 237 mL via ORAL

## 2023-11-18 MED ORDER — IPRATROPIUM-ALBUTEROL 0.5-2.5 (3) MG/3ML IN SOLN
3.0000 mL | Freq: Four times a day (QID) | RESPIRATORY_TRACT | Status: DC
  Administered 2023-11-18 – 2023-11-19 (×5): 3 mL via RESPIRATORY_TRACT
  Filled 2023-11-18 (×4): qty 3

## 2023-11-18 MED ORDER — METHYLPREDNISOLONE SODIUM SUCC 40 MG IJ SOLR
40.0000 mg | Freq: Two times a day (BID) | INTRAMUSCULAR | Status: AC
  Administered 2023-11-18 (×2): 40 mg via INTRAVENOUS
  Filled 2023-11-18 (×2): qty 1

## 2023-11-18 NOTE — Progress Notes (Signed)
       CROSS COVER NOTE  NAME: Steven Elliott MRN: [id number] DOB : 1929/12/26 ATTENDING PHYSICIAN: Kathrynn Running, MD    Date of Service   11/18/2023   HPI/Events of Note   Course rhonchi with sputum production in addition to 2+ peripheral edema   Interventions   Assessment/Plan    11/18/2023    4:08 AM 11/18/2023   12:48 AM 11/17/2023   11:44 PM  Vitals with BMI  Systolic 108 132 409  Diastolic 65 73 81  Pulse 110 114 119   Temp                     99.3                       99.5 BNP ordered  172.3 - normal for age Continue fluids secondary to acidosis and fever Repeat lactic in am        Donnie Mesa NP [facility] Cross Cover 7pm-7am - check amion for availability Pager [phone]

## 2023-11-18 NOTE — Progress Notes (Addendum)
 PHARMACY NOTE:  ANTIMICROBIAL RENAL DOSAGE ADJUSTMENT  Current antimicrobial regimen includes a mismatch between antimicrobial dosage and estimated renal function.  As per policy approved by the Pharmacy & Therapeutics and Medical Executive Committees, the antimicrobial dosage will be adjusted accordingly.  Current antimicrobial dosage:  Tamiflu 30 daily  Indication: Influenza infection  Renal Function:  Estimated Creatinine Clearance: 58.2 mL/min (by C-G formula based on SCr of 0.71 mg/dL). []      On intermittent HD, scheduled: []      On CRRT    Antimicrobial dosage has been changed to:  Tamiflu 75 mg x 1 then Tamiflu 30 BID  Additional comments:  Thank you for allowing pharmacy to be a part of this patient's care.  Effie Shy, PharmD Pharmacy Resident  11/18/2023 11:16 AM

## 2023-11-18 NOTE — Progress Notes (Signed)
   11/18/23 0630  Spiritual Encounters  Type of Visit Initial  Care provided to: Patient;Family  Conversation partners present during encounter Nurse  Reason for visit Code  OnCall Visit Yes   Chaplain received a Rapid Response to [employer].  Chaplain visited patient and daughter and provided a compassionate presence and prayer.  Rev. Rana M. Earlene Plater, MDiv Chaplain Resident [facility]

## 2023-11-18 NOTE — Progress Notes (Signed)
 RN and patient's daughter moved patient up in the bed. HOB increased to  >45 degrees.  Patient then began to state that  he felt as though he was going to pass out and was feeling hot. Patient's daughter began to fan the patient with paper menu at bedside. RN instructed the patient in deep breathing exercises.   Patient began , wheezing with coarse crackles in Bilateral upper lobes. RN called respiratory to come to the bedside. Tachypnea present, O2 sat mid 90s on 2.5L [place].   Charge RN at bedside  RN notified Manuela Schwartz, NP of the above information.   CBG 93   O2 saturation began to decrease into the mid 80s. RN increased O2 to 4L [place] with some improvement to low 90s.   RN called a Rapid Response.   Orders Received: administer IV lasix 40mg , place patient on BiPAP, chest xray, Transfer patient to PCU.   No beds available in PCU. Patient transfer changed to ICU.   RN accompanied the patient to ICU for transport. Rapid Response RN present for transport, Respiratory present for transport. Safety maintained during transport.   Patient's daughter is at the bedside she brought the patient's belongings to the ICU with her. No belonging left in patient's room, this RN checked after transporting patient to ICU.

## 2023-11-18 NOTE — Progress Notes (Addendum)
 PROGRESS NOTE       Steven Elliott   ZOX:[id number] DOB: 06/26/1930 DOA: 09/08/2023 PCP: [facility]    156A/156A-AA  LOS: 17 days    Brief hospital course:   Steven Elliott is a 89 y.o. male with medical history significant of dementia, chronic kidney disease, COPD, hyponatremia prostate cancer presenting with acute respiratory failure with hypoxia, COPD exacerbation, SIRS, weakness.  History primarily from patient's daughter in the setting of decompensated respiratory failure.  Patient noted to have been found shortness of breath with generalized rhonchi at home by EMS.  Patient noted to be sick x 3 days with URI symptoms.  Positive sick contact with similar symptoms pending evaluation today.  No fevers or chills.  Noted recent admission December 18 through January 6 for issues including closed wedge compression fracture of the T11 vertebrae as well as aspiration pneumonia.  Patient otherwise stable inpatient rehab course per the daughter.  No reported falls no worsening weakness.  No abdominal pain.  No reported nausea or vomiting.  Symptoms had sudden onset.  No reported diarrhea.  Per the daughter, patient does not know how to properly use inhalers associate with COPD at home.    Assessment & Plan: Here with influenza/copd exacerbation  Influenza A Start tamiflu  Acute hypoxic respiratory failure Rapid response this morning for dyspnea, now on bipap and breathing comfortably - wean bipap as able   Copd with exacerbation 2/2 flu - methylpred   Baseline dementia At baseline currently - home memantine  Hfpef with mild exacerbation Volume up clinically, bnp elevated - continue lasix 40 iv bid today - update tte   T11-12 fractures status post fall  -underlying ankylosing spondylitis Recent hospitalization for this, s/p rehab stay - PT consult tomorrow   Anemia of chronic disease Stable H&H, monitor   Essential (primary) hypertension BP low normal, not  on meds at home - monitor   Cerebral aneurysm, nonruptured CT head recent hospitalization shows stable aneurysm   Prostate cancer metastatic to bone (HCC) --cont home Nubeqa      DVT prophylaxis: Lovenox SQ Code Status: Full code  Family Communication: daughter updated at bedside 2/27 Level of care: Med-Surg Dispo:   The patient is from: home Anticipated d/c is to: tbd     Subjective and Interval History:  No complaints, confused       Constitutional: NAD, in bed with bipap mask HEENT: atraumatic CV: tachycardic, rr RESP: few rhonchi Neuro: moving all 4 Psych: calm,  Skin: no visible lesions     Data Reviewed: I have personally reviewed labs and imaging studies   CRITICAL CARE Performed by: Silvano Bilis   Total critical care time: 55 minutes  Critical care time was exclusive of separately billable procedures and treating other patients.  Critical care was necessary to treat or prevent imminent or life-threatening deterioration.  Critical care was time spent personally by me on the following activities: development of treatment plan with patient and/or surrogate as well as nursing, discussions with consultants, evaluation of patient's response to treatment, examination of patient, obtaining history from patient or surrogate, ordering and performing treatments and interventions, ordering and review of laboratory studies, ordering and review of radiographic studies, pulse oximetry and re-evaluation of patient's condition.      Silvano Bilis, MD [facility] If 7PM-7AM, please contact night-coverage 09/26/2023, 3:53 PM

## 2023-11-18 NOTE — Progress Notes (Signed)
 Palliative Care Progress Note, Assessment & Plan   Patient Name: Steven Elliott       Date: 11/18/2023 DOB: 05/31/1930  Age: 89 y.o. MRN#: [id number] Attending Physician: Kathrynn Running, MD Primary Care Physician: [facility] Admit Date: 11/17/2023  Subjective: Sitting up in bed in mild respiratory distress.  Respirations are approximately 32/min.  He endorses he feels like he cannot catch his breath. RN came to bedside and placed patient back on BiPAP.  Patient's son-in-law is at bedside.  HPI: 89 y.o. male  with past medical history of dementia, CKD, COPD, hyponatremia, prostate cancer admitted on 11/17/2023 with complaints of shortness of breath and generalized weakness.   Patient is being treated for acute respiratory failure with hypoxia, COPD exacerbation, cellulitis, and weakness.   Radiograph of chest revealed COPD with bibasilar atelectasis/infiltrates.  CT of chest revealed no evidence of pulmonary embolism, small bilateral pleural effusions (left greater than right) with associated bilateral lower lobe atelectasis, aortic atherosclerosis, and emphysema.   PMT was consulted to support patient and family with goals of care conversations.  Summary of counseling/coordination of care: Extensive chart review completed prior to meeting patient including labs, vital signs, imaging, progress notes, orders, and available advanced directive documents from current and previous encounters.   After reviewing the patient's chart and assessing the patient at bedside, I spoke with patient, [name] in person, and [name] via speaker phone in regards to symptom management and goals of care.   Discussed patient's rapid response for tachycardia and respiratory distress.   Reviewed need for patient have a progressive care level bed and thus is transferred to ICU as no PCU beds were available.  Discussed great concern for patient's respiratory status in light of his chronic condition of COPD and acute illness of influenza A.  Space and opportunity provided for family to share their thoughts and emotions regarding patient's current medical situation.  Patient's daughter Steven Elliott expresses guilt over feeling as though she has made her father ill as she also has influenza A at this time.  [name] shares that patient is always said he would prefer to have quality of life over quantity of life.  She shares she believes he would never want to be placed on a ventilator and may never be accepting of a feeding tube.  She shares that if he had capacity and can speak for himself right now he would say that he would want to be a DNR and a DNI.  Human mortality and quantity versus quality of life discussed.  Difference between full code, DNR with full interventions, and DNR with limited interventions reviewed.  After extensive discussion with space and opportunity for patient to ask questions, patient's daughters were in agreement to shift from full code to DNR with limited interventions.  Attending and RN made aware.  Physical Exam Vitals reviewed.  Constitutional:      General: He is in acute distress.     Appearance: He is not ill-appearing.  HENT:     Head: Normocephalic.  Cardiovascular:     Rate and Rhythm: Tachycardia present.  Pulmonary:     Effort: Tachypnea present.  Skin:  General: Skin is warm and dry.  Neurological:     Mental Status: He is alert.     Comments: Oriented to self  Psychiatric:        Mood and Affect: Mood is not anxious.        Behavior: Behavior is not agitated.             Total Time 50 minutes   Time spent includes: Detailed review of medical records (labs, imaging, vital signs), medically appropriate exam (mental status,  respiratory, cardiac, skin), discussed with treatment team, counseling and educating patient, family and staff, documenting clinical information, medication management and coordination of care.  Steven Deist L. Bonita Quin, DNP, FNP-BC Palliative Medicine Team

## 2023-11-18 NOTE — Progress Notes (Signed)
       CROSS COVER NOTE  NAME: Steven Elliott MRN: [id number] DOB : [birth year] ATTENDING PHYSICIAN: Kathrynn Running, MD    Date of Service   11/18/2023   HPI/Events of Note   Rapid rsponse called secondary to increased work of breathing and tachycradia  Interventions   Assessment/Plan: Course crackles and wheezzing throughoute Moderate distress with work of breathing  Bipap Lasix 40 x 1 Transfer to PCU        Donnie Mesa NP [facility] Cross Cover 7pm-7am - check amion for availability Pager [phone]

## 2023-11-18 NOTE — Plan of Care (Signed)

## 2023-11-18 NOTE — Significant Event (Signed)
 Rapid Response Event Note   Reason for Call :  SOB Initial Focused Assessment:  Patient with increase work of breathing at rest. Crackles heard throughtout all lung lobes. Hr in the 130s. Patient using one word answers. Per Primary nurse patients 02 was increased from 2.5L to 6L by respiratory. Patient O2 90-94 on 6L [place]. Manuela Schwartz NPat bedside.    Interventions:  -Lasixs IV -Bipap -ChestXRAY -Level of care changed to transfer to PCU but no beds avaible so patient transferred to ICU/Stepdown unit   MD Notified: [id number] Rebeca Allegra NP Call [phone] Arrival [id number] End [id number]  Judyann Munson, RN

## 2023-11-19 ENCOUNTER — Inpatient Hospital Stay
Admit: 2023-11-19 | Discharge: 2023-11-19 | Disposition: A | Discharge status: Home / Self Care | Payer: Medicare Other | Attending: Obstetrics and Gynecology | Admitting: Obstetrics and Gynecology

## 2023-11-19 DIAGNOSIS — Z515 Encounter for palliative care: Secondary | ICD-10-CM | POA: Diagnosis not present

## 2023-11-19 DIAGNOSIS — J9601 Acute respiratory failure with hypoxia: Secondary | ICD-10-CM | POA: Diagnosis not present

## 2023-11-19 DIAGNOSIS — J441 Chronic obstructive pulmonary disease with (acute) exacerbation: Secondary | ICD-10-CM | POA: Diagnosis not present

## 2023-11-19 DIAGNOSIS — E43 Unspecified severe protein-calorie malnutrition: Secondary | ICD-10-CM | POA: Insufficient documentation

## 2023-11-19 LAB — ECHOCARDIOGRAM COMPLETE
AR max vel: 2.98 cm2
AV Area VTI: 4.19 cm2
AV Area mean vel: 3.1 cm2
AV Mean grad: 2 mm[Hg]
AV Peak grad: 3.4 mm[Hg]
Ao pk vel: 0.92 m/s
Area-P 1/2: 4.63 cm2
Calc EF: 43.1 %
Height: 68 in
S' Lateral: 3.7 cm
Single Plane A2C EF: 26.3 %
Single Plane A4C EF: 52.4 %
Weight: 2656.1 [oz_av]

## 2023-11-19 LAB — BLOOD GAS, VENOUS

## 2023-11-19 LAB — BASIC METABOLIC PANEL
Anion gap: 10 (ref 5–15)
BUN: 13 mg/dL (ref 8–23)
CO2: 26 mmol/L (ref 22–32)
Calcium: 6.9 mg/dL — ABNORMAL LOW (ref 8.9–10.3)
Chloride: 101 mmol/L (ref 98–111)
Creatinine, Ser: 0.76 mg/dL (ref 0.61–1.24)
GFR, Estimated: >60 mL/min (ref >60)
Glucose, Bld: 107 mg/dL — ABNORMAL HIGH (ref 70–99)
Potassium: 3 mmol/L — ABNORMAL LOW (ref 3.5–5.1)
Sodium: 137 mmol/L (ref 135–145)

## 2023-11-19 LAB — MAGNESIUM: Magnesium: 2 mg/dL (ref 1.7–2.4)

## 2023-11-19 LAB — GLUCOSE, CAPILLARY: Glucose-Capillary: 179 mg/dL — ABNORMAL HIGH (ref 70–99)

## 2023-11-19 LAB — POTASSIUM: Potassium: 4 mmol/L (ref 3.5–5.1)

## 2023-11-19 LAB — ALBUMIN: Albumin: 2 g/dL — ABNORMAL LOW (ref 3.5–5.0)

## 2023-11-19 MED ORDER — ENSURE ENLIVE PO LIQD
237.0000 mL | Freq: Three times a day (TID) | ORAL | Status: DC
  Administered 2023-11-20 – 2023-12-03 (×23): 237 mL via ORAL

## 2023-11-19 MED ORDER — VITAMIN C 500 MG PO TABS
500.0000 mg | ORAL_TABLET | Freq: Two times a day (BID) | ORAL | Status: DC
  Administered 2023-11-19 – 2023-12-08 (×35): 500 mg via ORAL
  Filled 2023-11-19 (×35): qty 1

## 2023-11-19 MED ORDER — POTASSIUM CHLORIDE CRYS ER 20 MEQ PO TBCR: 40.0000 meq | EXTENDED_RELEASE_TABLET | Freq: Once | ORAL | Status: DC

## 2023-11-19 MED ORDER — GALANTAMINE HYDROBROMIDE 4 MG PO TABS
8.0000 mg | ORAL_TABLET | Freq: Two times a day (BID) | ORAL | Status: DC
Start: 2023-11-20 — End: 2023-12-08
  Administered 2023-11-20 – 2023-11-29 (×19): 8 mg via ORAL
  Administered 2023-11-29: 4 mg via ORAL
  Administered 2023-11-30 – 2023-12-08 (×14): 8 mg via ORAL
  Filled 2023-11-19 (×41): qty 2

## 2023-11-19 MED ORDER — GABAPENTIN 100 MG PO CAPS
100.0000 mg | ORAL_CAPSULE | Freq: Every day | ORAL | Status: DC
  Administered 2023-11-19 – 2023-12-07 (×16): 100 mg via ORAL
  Filled 2023-11-19 (×16): qty 1

## 2023-11-19 MED ORDER — POTASSIUM CHLORIDE CRYS ER 20 MEQ PO TBCR
40.0000 meq | EXTENDED_RELEASE_TABLET | Freq: Once | ORAL | Status: AC
  Administered 2023-11-19: 40 meq via ORAL
  Filled 2023-11-19: qty 2

## 2023-11-19 MED ORDER — IPRATROPIUM-ALBUTEROL 0.5-2.5 (3) MG/3ML IN SOLN
3.0000 mL | Freq: Three times a day (TID) | RESPIRATORY_TRACT | Status: DC
  Administered 2023-11-19 – 2023-11-20 (×2): 3 mL via RESPIRATORY_TRACT
  Filled 2023-11-19 (×3): qty 3

## 2023-11-19 MED ORDER — FUROSEMIDE 10 MG/ML IJ SOLN
40.0000 mg | Freq: Once | INTRAMUSCULAR | Status: AC
  Administered 2023-11-19: 40 mg via INTRAVENOUS
  Filled 2023-11-19: qty 4

## 2023-11-19 MED ORDER — POTASSIUM CHLORIDE 10 MEQ/100ML IV SOLN
10.0000 meq | INTRAVENOUS | Status: AC
  Administered 2023-11-19 (×4): 10 meq via INTRAVENOUS
  Filled 2023-11-19 (×4): qty 100

## 2023-11-19 MED ORDER — DULOXETINE HCL 20 MG PO CPEP
20.0000 mg | ORAL_CAPSULE | Freq: Two times a day (BID) | ORAL | Status: DC
  Administered 2023-11-19 – 2023-12-08 (×34): 20 mg via ORAL
  Filled 2023-11-19 (×39): qty 1

## 2023-11-19 MED ORDER — GALANTAMINE HYDROBROMIDE 4 MG PO TABS
8.0000 mg | ORAL_TABLET | Freq: Two times a day (BID) | ORAL | Status: DC
  Administered 2023-11-19: 8 mg via ORAL
  Filled 2023-11-19 (×4): qty 2

## 2023-11-19 MED ORDER — ADULT MULTIVITAMIN W/MINERALS CH
1.0000 | ORAL_TABLET | Freq: Every day | ORAL | Status: DC
  Administered 2023-11-20 – 2023-12-08 (×18): 1 via ORAL
  Filled 2023-11-19 (×18): qty 1

## 2023-11-19 MED ORDER — PREDNISONE 20 MG PO TABS
40.0000 mg | ORAL_TABLET | Freq: Every day | ORAL | Status: DC
  Administered 2023-11-19 – 2023-11-23 (×5): 40 mg via ORAL
  Filled 2023-11-19 (×5): qty 2

## 2023-11-19 MED ORDER — ACETAMINOPHEN 325 MG PO TABS
650.0000 mg | ORAL_TABLET | Freq: Four times a day (QID) | ORAL | Status: DC | PRN
Start: 2023-11-19 — End: 2023-12-08
  Administered 2023-11-19 – 2023-12-08 (×25): 650 mg via ORAL
  Filled 2023-11-19 (×27): qty 2

## 2023-11-19 NOTE — Progress Notes (Signed)
 Initial Nutrition Assessment  DOCUMENTATION CODES:   Severe malnutrition in context of chronic illness  INTERVENTION:   Ensure Enlive po TID, each supplement provides 350 kcal and 20 grams of protein.  Magic cup TID with meals, each supplement provides 290 kcal and 9 grams of protein  MVI po daily   Vitamin C 500mg  po BID  Steven Elliott at high refeed risk; recommend monitor potassium, magnesium and phosphorus labs daily until stable  Daily weights   NUTRITION DIAGNOSIS:   Severe Malnutrition related to chronic illness as evidenced by severe fat depletion, severe muscle depletion.  GOAL:   Patient will meet greater than or equal to 90% of their needs  MONITOR:   PO intake, Supplement acceptance, Labs, Weight trends, I & O's, Skin  REASON FOR ASSESSMENT:   Malnutrition Screening Tool    ASSESSMENT:   89 y/o male with h/o COPD, dementia, metastatic prostate cancer, HTN, compression fracture, BPH, CKD III, GERD, diverticulosis, PVD and AAA who is admitted with SIRS, flu A and COPD exacerbation.  Met with Steven Elliott in room today. Steven Elliott reports fairly good appetite and oral intake at baseline. Steven Elliott reports that his oral intake is fairly good in hospital. Steven Elliott reports eating most of his breakfast this morning; Steven Elliott is documented to have only eaten bites of breakfast and refused lunch. Steven Elliott is agreeable to drinking chocolate Ensure. RD will add supplements and vitamins to help Steven Elliott meet his estimated needs. Steven Elliott remains at refeed risk. Per chart, Steven Elliott appears fairly weight stable at baseline. Palliative care is following.   Medications reviewed and include: lovenox, prednisone  Labs reviewed: K 3.0(L), Ca 6.9(L) adj. 8.5(L), Mg 2.0 wnl, albumin 2.0(L) Hgb 10.0(L), Hct 30.3(L)  NUTRITION - FOCUSED PHYSICAL EXAM:  Flowsheet Row Most Recent Value  Orbital Region Severe depletion  Upper Arm Region Moderate depletion  Thoracic and Lumbar Region Moderate depletion  Buccal Region Severe depletion  Temple  Region Severe depletion  Clavicle Bone Region Severe depletion  Clavicle and Acromion Bone Region Severe depletion  Scapular Bone Region Moderate depletion  Dorsal Hand Unable to assess  Patellar Region Severe depletion  Anterior Thigh Region Severe depletion  Posterior Calf Region Severe depletion  Edema (RD Assessment) Moderate  Hair Reviewed  Eyes Reviewed  Mouth Reviewed  Skin Reviewed  Nails Reviewed   Diet Order:   Diet Order             DIET DYS 3 Room service appropriate? Yes; Fluid consistency: Thin  Diet effective now                  EDUCATION NEEDS:   Education needs have been addressed  Skin:  Skin Assessment: Reviewed RN Assessment (Stage I back, skin tears)  Last BM:  2/27  Height:   Ht Readings from Last 1 Encounters:  11/18/23 5\' 8"  (1.727 m)    Weight:   Wt Readings from Last 1 Encounters:  11/18/23 75.3 kg    Ideal Body Weight:  70 kg  BMI:  Body mass index is 25.24 kg/m.  Estimated Nutritional Needs:   Kcal:  1800-2100kcal/day  Protein:  90-105g/day  Fluid:  1.8-2.1L/day  Betsey Holiday MS, RD, LDN If unable to be reached, please send secure chat to "RD inpatient" available from 8:00a-4:00p daily

## 2023-11-19 NOTE — Progress Notes (Signed)
 PROGRESS NOTE       THURLOW GALLAGA   XBJ:[id number] DOB: Aug 23, 1930 DOA: 09/08/2023 [facility]CP: [facility]    156A/156A-AA  LOS: 17 days    Brief hospital course:   Steven Elliott is a 89 y.o. male with medical history significant of dementia, chronic kidney disease, COPD, hyponatremia prostate cancer presenting with acute respiratory failure with hypoxia, COPD exacerbation, SIRS, weakness.  History primarily from patient's daughter in the setting of decompensated respiratory failure.  Patient noted to have been found shortness of breath with generalized rhonchi at home by EMS.  Patient noted to be sick x 3 days with URI symptoms.  Positive sick contact with similar symptoms pending evaluation today.  No fevers or chills.  Noted recent admission December 18 through January 6 for issues including closed wedge compression fracture of the T11 vertebrae as well as aspiration pneumonia.  Patient otherwise stable inpatient rehab course per the daughter.  No reported falls no worsening weakness.  No abdominal pain.  No reported nausea or vomiting.  Symptoms had sudden onset.  No reported diarrhea.  Per the daughter, patient does not know how to properly use inhalers associate with COPD at home.    Assessment & Plan: Here with influenza/copd exacerbation  Influenza A - continue tamiflu  Acute hypoxic respiratory failure Rapid response yesterda morning for dyspnea, improved on bipap now weaned to 3 liters, breathing comfortably - continue [place] o2, wean as able  Hypokalemia - replete and monitor  Copd with exacerbation 2/2 flu - methylpred>prednisone   Baseline dementia At baseline currently - home memantine  Hfpef with mild exacerbation Volume up clinically, bnp elevated. Out 4200 yesterday - continue lasix 40 iv bid - update tte   T11-12 fractures status post fall  -underlying ankylosing spondylitis Recent hospitalization for this, s/p rehab stay - PT consult tomorrow    Anemia of chronic disease Stable H&H, monitor   Essential (primary) hypertension BP low normal, not on meds at home - monitor   Cerebral aneurysm, nonruptured CT head recent hospitalization shows stable aneurysm   Prostate cancer metastatic to bone (HCC) --cont home Nubeqa      DVT prophylaxis: Lovenox SQ Code Status: Full code  Family Communication: daughter updated at bedside 2/28 Level of care: Med-Surg Dispo:   The patient is from: home Anticipated d/c is to: tbd pending pt eval     Subjective and Interval History:  No complaints, says breathing feels fine, speaking in complete sentences       Constitutional: NAD, in bed   HEENT: atraumatic CV: tachycardic, rr RESP: few rhonchi, exp wheeze, no tachypnea or increased wob Neuro: moving all 4 Psych: calm,  Skin: no visible lesions     Data Reviewed: I have personally reviewed labs and imaging studies           Silvano Bilis, MD [facility] If 7PM-7AM, please contact night-coverage 09/26/2023, 3:53 PM

## 2023-11-19 NOTE — Plan of Care (Signed)
  Problem: Clinical Measurements: Goal: Ability to maintain clinical measurements within normal limits will improve Outcome: Progressing Goal: Will remain free from infection Outcome: Progressing Goal: Respiratory complications will improve Outcome: Progressing   Problem: Activity: Goal: Risk for activity intolerance will decrease Outcome: Progressing   Problem: Nutrition: Goal: Adequate nutrition will be maintained Outcome: Progressing   Problem: Coping: Goal: Level of anxiety will decrease Outcome: Progressing   Problem: Elimination: Goal: Will not experience complications related to bowel motility Outcome: Progressing Goal: Will not experience complications related to urinary retention Outcome: Progressing   Problem: Pain Managment: Goal: General experience of comfort will improve and/or be controlled Outcome: Progressing   Problem: Safety: Goal: Ability to remain free from injury will improve Outcome: Progressing   Problem: Education: Goal: Knowledge of the prescribed therapeutic regimen will improve Outcome: Progressing

## 2023-11-19 NOTE — Progress Notes (Signed)
 Discussed level of care with Dr. Ashok Pall; pt intermittently tachypneic in 20's, and complains of transient SOB, esp with activity such as turning in bed. Forgetful, believes he is at home or at the store, reorients however. Breath sounds moist, with exp wheezes but improved since this am; O2 sats mid to upper 90's on 3 LPM. Poor appetite today. 1650 cc uop this shift receiving IV lasix. Daughter at bedside at this time.

## 2023-11-19 NOTE — Progress Notes (Signed)
 Physical Therapy Treatment Patient Details Name: Steven Elliott MRN: [id number] DOB: [birth year] Today's Date: 11/19/2023   History of Present Illness Patient is a 89 year old male with complaints of shortness of breath and generalized weakness, Found to have COPD exacerbation and influenza A. History of dementia, CKD, COPD, hyponatremia, prostate cancer, T1-12 fractures s/p fall, ankylosing spondylitis    PT Comments  Patient seen for PT evaluation. Daughter at bedside. She reports patient just recently discharged home from short term rehab. She reports he was walking using the walking and is still requiring the brace. When he went home, he had increasing difficulty managing at home.  Today the patient is laying comfortably in bed. He declined sitting up on edge of bed today. Total assistance for rolling/repositioning in bed. He did participate with therapeutic exercises for strengthening. Anticipate the patient will require physical assistance with mobility if going home. Consider rehabilitation < 3 hours/day if patient does not have adequate support for mobilizing.    If plan is discharge home, recommend the following: Two people to help with walking and/or transfers;Two people to help with bathing/dressing/bathroom;Help with stairs or ramp for entrance;Assist for transportation;Supervision due to cognitive status;Assistance with cooking/housework;Assistance with feeding;Direct supervision/assist for medications management;Direct supervision/assist for financial management   Can travel by private vehicle     No  Equipment Recommendations  None recommended by PT    Recommendations for Other Services       Precautions / Restrictions Precautions Precautions: Fall Recall of Precautions/Restrictions: Impaired Required Braces or Orthoses: Spinal Brace Spinal Brace: Thoracolumbosacral orthotic (supposed to wear with mobility per daughter) Restrictions Weight Bearing Restrictions Per  Provider Order: No     Mobility  Bed Mobility Overal bed mobility: Needs Assistance Bed Mobility: Rolling Rolling: Total assist         General bed mobility comments: total assistance for repositioning/rolling to the left for comfort. encouraged routine repositioning for skin integrity. patient refused sitting up on edge of bed    Transfers                        Ambulation/Gait                   Stairs             Wheelchair Mobility     Tilt Bed    Modified Rankin (Stroke Patients Only)       Balance                                            Communication Communication Communication: Impaired Factors Affecting Communication: Hearing impaired  Cognition Arousal: Alert Behavior During Therapy: WFL for tasks assessed/performed   PT - Cognitive impairments: History of cognitive impairments                         Following commands: Impaired Following commands impaired: Follows one step commands inconsistently    Cueing Cueing Techniques: Verbal cues  Exercises General Exercises - Upper Extremity Shoulder Flexion: AAROM, Strengthening, Both, 5 reps, Supine (to less than 90 degrees for comfort) Elbow Flexion: AAROM, Strengthening, Right, 5 reps, Supine General Exercises - Lower Extremity Ankle Circles/Pumps: AAROM, Strengthening, Both, 10 reps, Supine Heel Slides: AAROM, Strengthening, Both, 10 reps, Supine Hip ABduction/ADduction: AAROM, Strengthening, Both, 10 reps, Supine Other Exercises Other Exercises:  verbal cues for exercise technique for strengthening. arms repositioned on pillows for comfort and edema management    General Comments General comments (skin integrity, edema, etc.): patient agreeable to in bed exercises for strengthening      Pertinent Vitals/Pain Pain Assessment Pain Assessment: No/denies pain    Home Living Family/patient expects to be discharged to:: Private  residence Living Arrangements: Children Available Help at Discharge: Family;Available 24 hours/day Type of Home: House Home Access: Stairs to enter   [employer] of Steps: 3   Home Layout: Able to live on main level with bedroom/bathroom Home Equipment: Tub bench;Hospital bed;Rollator (4 wheels);Rolling Walker (2 wheels);Wheelchair - manual      Prior Function            PT Goals (current goals can now be found in the care plan section) Acute Rehab PT Goals Patient Stated Goal: daughter's goal is to see how he is doing closer to discharge to determine if he needs rehab vs home PT Goal Formulation: With family Time For Goal Achievement: 12/03/23 Potential to Achieve Goals: Fair    Frequency    Min 1X/week      PT Plan      Co-evaluation              AM-PAC PT "6 Clicks" Mobility   Outcome Measure  Help needed turning from your back to your side while in a flat bed without using bedrails?: A Lot Help needed moving from lying on your back to sitting on the side of a flat bed without using bedrails?: Total Help needed moving to and from a bed to a chair (including a wheelchair)?: Total Help needed standing up from a chair using your arms (e.g., wheelchair or bedside chair)?: Total Help needed to walk in hospital room?: Total Help needed climbing 3-5 steps with a railing? : Total 6 Click Score: 7    End of Session Equipment Utilized During Treatment: Oxygen Activity Tolerance: Patient limited by fatigue Patient left: in bed;with call bell/phone within reach;with family/visitor present;with SCD's reapplied Nurse Communication: Mobility status PT Visit Diagnosis: Muscle weakness (generalized) (M62.81);Unsteadiness on feet (R26.81)     Time: 1610-9604 PT Time Calculation (min) (ACUTE ONLY): 20 min  Charges:    $Therapeutic Exercise: 8-22 mins PT General Charges $$ ACUTE PT VISIT: 1 Visit                     Donna Bernard, PT, MPT    Ina Homes 11/19/2023, 11:24 AM

## 2023-11-19 NOTE — Progress Notes (Signed)
*  PRELIMINARY RESULTS* Echocardiogram 2D Echocardiogram has been performed.  Cristela Blue 11/19/2023, 8:55 AM

## 2023-11-19 NOTE — Progress Notes (Signed)
 Palliative Care Progress Note, Assessment & Plan   Patient Name: Steven Elliott       Date: 11/19/2023 DOB: [birth year]  Age: 89 y.o. MRN#: [id number] Attending Physician: Kathrynn Running, MD Primary Care Physician: [facility] Admit Date: 11/17/2023  Subjective: Is lying in bed in no apparent distress with nasal cannula in place.  He acknowledges my presence.  His daughter [name] is at bedside.  Attending Dr. Ashok Pall also present during my visit.  HPI: 89 y.o. male  with past medical history of dementia, CKD, COPD, hyponatremia, prostate cancer admitted on 11/17/2023 with complaints of shortness of breath and generalized weakness.   Patient is being treated for acute respiratory failure with hypoxia, COPD exacerbation, cellulitis, and weakness.   Radiograph of chest revealed COPD with bibasilar atelectasis/infiltrates.  CT of chest revealed no evidence of pulmonary embolism, small bilateral pleural effusions (left greater than right) with associated bilateral lower lobe atelectasis, aortic atherosclerosis, and emphysema.   PMT was consulted to support patient and family with goals of care conversations.  Summary of counseling/coordination of care: Extensive chart review completed prior to meeting patient including labs, vital signs, imaging, progress notes, orders, and available advanced directive documents from current and previous encounters.   After reviewing the patient's chart and assessing the patient at bedside, I spoke with patient in regards to symptom management and goals of care.  [name] expresses great relief that patient is showing signs of improvement.  Attending alignment medical update, highlighting that patient has shown great improvement, ABG is WNL, and that diuresis will  continue.  PT and OT will now be engaged to evaluate patient.  Space and opportunity provided for [name] to ask questions and share her thoughts regarding patient's current medical situation.  She has no questions or concerns at this time.  She is open to patient going to rehab if needed.  We again discussed boundaries and goals of care for patient.  She remains in agreement with DNR with limited interventions.  At this time, no acute palliative needs.  Dr. Ashok Pall advised that PMT is not needed actively at this time.   PMT will step back from daily visits and monitor the patient peripherally.  Please reengage if goals change, at patient/family's request, or if patient's health deteriorates during hospitalization.  Physical Exam Vitals reviewed.  Constitutional:      General: He is not in acute distress.    Appearance: He is normal weight.  HENT:     Head: Normocephalic.  Cardiovascular:     Rate and Rhythm: Tachycardia present.  Musculoskeletal:     Comments: Generalized weakness  Skin:    General: Skin is warm and dry.  Neurological:     Mental Status: He is alert.  Psychiatric:        Mood and Affect: Mood normal. Mood is not anxious.        Behavior: Behavior normal. Behavior is not agitated.             Total Time 35 minutes   Time spent includes: Detailed review of medical records (labs, imaging, vital signs), medically appropriate exam (mental status, respiratory, cardiac, skin), discussed with treatment team, counseling and educating patient,  family and staff, documenting clinical information, medication management and coordination of care.  [name] L. Bonita Quin, DNP, FNP-BC Palliative Medicine Team

## 2023-11-20 DIAGNOSIS — J441 Chronic obstructive pulmonary disease with (acute) exacerbation: Secondary | ICD-10-CM | POA: Diagnosis not present

## 2023-11-20 LAB — BASIC METABOLIC PANEL
Anion gap: 7 (ref 5–15)
BUN: 14 mg/dL (ref 8–23)
CO2: 27 mmol/L (ref 22–32)
Calcium: 7.2 mg/dL — ABNORMAL LOW (ref 8.9–10.3)
Chloride: 104 mmol/L (ref 98–111)
Creatinine, Ser: 0.56 mg/dL — ABNORMAL LOW (ref 0.61–1.24)
GFR, Estimated: >60 mL/min (ref >60)
Glucose, Bld: 113 mg/dL — ABNORMAL HIGH (ref 70–99)
Potassium: 3.9 mmol/L (ref 3.5–5.1)
Sodium: 138 mmol/L (ref 135–145)

## 2023-11-20 LAB — PHOSPHORUS: Phosphorus: 3.4 mg/dL (ref 2.5–4.6)

## 2023-11-20 MED ORDER — FUROSEMIDE 10 MG/ML IJ SOLN
40.0000 mg | Freq: Once | INTRAMUSCULAR | Status: AC
  Administered 2023-11-20: 40 mg via INTRAVENOUS
  Filled 2023-11-20: qty 4

## 2023-11-20 MED ORDER — IPRATROPIUM-ALBUTEROL 0.5-2.5 (3) MG/3ML IN SOLN
3.0000 mL | RESPIRATORY_TRACT | Status: DC | PRN
  Administered 2023-11-20 – 2023-12-06 (×14): 3 mL via RESPIRATORY_TRACT
  Filled 2023-11-20 (×13): qty 3

## 2023-11-20 NOTE — Care Management Important Message (Signed)
 Important Message  Patient Details  Name: Steven Elliott MRN: [id number] Date of Birth: [birth year]   Important Message Given:  Yes - Medicare IM     Cristela Blue, CMA 11/20/2023, 9:44 AM

## 2023-11-20 NOTE — Progress Notes (Signed)
 PROGRESS NOTE       Steven Elliott   ZOX:[id number] DOB: 10-03-1929 DOA: 09/08/2023 [facility]CP: [facility]    156A/156A-AA  LOS: 17 days    Brief hospital course:   Steven Elliott is a 89 y.o. male with medical history significant of dementia, chronic kidney disease, COPD, hyponatremia prostate cancer presenting with acute respiratory failure with hypoxia, COPD exacerbation, SIRS, weakness.  History primarily from patient's daughter in the setting of decompensated respiratory failure.  Patient noted to have been found shortness of breath with generalized rhonchi at home by EMS.  Patient noted to be sick x 3 days with URI symptoms.  Positive sick contact with similar symptoms pending evaluation today.  No fevers or chills.  Noted recent admission December 18 through January 6 for issues including closed wedge compression fracture of the T11 vertebrae as well as aspiration pneumonia.  Patient otherwise stable inpatient rehab course per the daughter.  No reported falls no worsening weakness.  No abdominal pain.  No reported nausea or vomiting.  Symptoms had sudden onset.  No reported diarrhea.  Per the daughter, patient does not know how to properly use inhalers associate with COPD at home.    Assessment & Plan: Here with influenza/copd exacerbation  Influenza A - continue tamiflu  Acute hypoxic respiratory failure Rapid response 2/27  morning for dyspnea, improved on bipap now weaned to 2 liters, breathing comfortably - continue [place] o2, wean as able  Hypokalemia Resolved - monitor  Copd with exacerbation 2/2 flu. Still wheezing - methylpred>prednisone - duonebs standing   Baseline dementia At baseline currently - home memantine  HFmrEF with mild exacerbation Volume up clinically, bnp elevated. Out  liters net. Ef 40-45 on tte, this is a new problem, w/ regional wall motion abnormalities though no chest pain and only minimal trop elevation. Have reviewed w/ dr. Graciela Husbands of  cardiology, he advises no additional w/u for the time being - continue lasix 40 iv bid   T11-12 fractures status post fall  -underlying ankylosing spondylitis Recent hospitalization for this, s/p rehab stay - PT advising snf, toc consulted   Anemia of chronic disease Stable H&H, monitor   Essential (primary) hypertension BP low normal, not on meds at home - monitor   Cerebral aneurysm, nonruptured CT head recent hospitalization shows stable aneurysm   Prostate cancer metastatic to bone (HCC) --cont home Nubeqa      DVT prophylaxis: Lovenox SQ Code Status: Full code  Family Communication: daughter updated at bedside 3/1 Level of care: Med-Surg Dispo:   The patient is from: home Anticipated d/c is to: tbd pending pt eval     Subjective and Interval History:  No complaints, says breathing is stable       Constitutional: NAD, in bed   HEENT: atraumatic CV: tachycardic, rr RESP: exp wheeze Neuro: moving all 4 Psych: calm,  Skin: no visible lesions     Data Reviewed: I have personally reviewed labs and imaging studies           Silvano Bilis, MD [facility] If 7PM-7AM, please contact night-coverage 09/26/2023, 3:53 PM

## 2023-11-20 NOTE — Plan of Care (Signed)
  Problem: Education: Goal: Knowledge of General Education information will improve Description: Including pain rating scale, medication(s)/side effects and non-pharmacologic comfort measures Outcome: Progressing   Problem: Health Behavior/Discharge Planning: Goal: Ability to manage health-related needs will improve Outcome: Progressing   Problem: Clinical Measurements: Goal: Ability to maintain clinical measurements within normal limits will improve Outcome: Progressing Goal: Will remain free from infection Outcome: Progressing Goal: Diagnostic test results will improve Outcome: Progressing Goal: Cardiovascular complication will be avoided Outcome: Progressing   Problem: Coping: Goal: Level of anxiety will decrease Outcome: Progressing   Problem: Elimination: Goal: Will not experience complications related to bowel motility Outcome: Progressing Goal: Will not experience complications related to urinary retention Outcome: Progressing   Problem: Pain Managment: Goal: General experience of comfort will improve and/or be controlled Outcome: Progressing   Problem: Safety: Goal: Ability to remain free from injury will improve Outcome: Progressing   Problem: Skin Integrity: Goal: Risk for impaired skin integrity will decrease Outcome: Progressing   Problem: Clinical Measurements: Goal: Respiratory complications will improve Outcome: Not Progressing   Problem: Activity: Goal: Risk for activity intolerance will decrease Outcome: Not Progressing   Problem: Nutrition: Goal: Adequate nutrition will be maintained Outcome: Not Progressing

## 2023-11-21 ENCOUNTER — Inpatient Hospital Stay

## 2023-11-21 DIAGNOSIS — J441 Chronic obstructive pulmonary disease with (acute) exacerbation: Secondary | ICD-10-CM | POA: Diagnosis not present

## 2023-11-21 LAB — BASIC METABOLIC PANEL
Anion gap: 9 (ref 5–15)
BUN: 14 mg/dL (ref 8–23)
CO2: 24 mmol/L (ref 22–32)
Calcium: 7.6 mg/dL — ABNORMAL LOW (ref 8.9–10.3)
Chloride: 104 mmol/L (ref 98–111)
Creatinine, Ser: 0.68 mg/dL (ref 0.61–1.24)
GFR, Estimated: >60 mL/min (ref >60)
Glucose, Bld: 80 mg/dL (ref 70–99)
Potassium: 3.9 mmol/L (ref 3.5–5.1)
Sodium: 137 mmol/L (ref 135–145)

## 2023-11-21 LAB — LACTIC ACID, PLASMA: Lactic Acid, Venous: 2.1 mmol/L (ref 0.5–1.9)

## 2023-11-21 LAB — D-DIMER, QUANTITATIVE: D-Dimer, Quant: 1.08 ug{FEU}/mL — ABNORMAL HIGH (ref 0.00–0.50)

## 2023-11-21 MED ORDER — LIDOCAINE 5 % EX PTCH
1.0000 | MEDICATED_PATCH | Freq: Once | CUTANEOUS | Status: AC
  Administered 2023-11-21: 1 via TRANSDERMAL
  Filled 2023-11-21: qty 1

## 2023-11-21 MED ORDER — FUROSEMIDE 10 MG/ML IJ SOLN
40.0000 mg | Freq: Two times a day (BID) | INTRAMUSCULAR | Status: DC
  Administered 2023-11-21: 40 mg via INTRAVENOUS
  Filled 2023-11-21: qty 4

## 2023-11-21 MED ORDER — ROCURONIUM BROMIDE 10 MG/ML (PF) SYRINGE
PREFILLED_SYRINGE | INTRAVENOUS | Status: AC
  Filled 2023-11-21: qty 10

## 2023-11-21 MED ORDER — MIDAZOLAM HCL 2 MG/2ML IJ SOLN
INTRAMUSCULAR | Status: AC
  Filled 2023-11-21: qty 2

## 2023-11-21 MED ORDER — FENTANYL CITRATE PF 50 MCG/ML IJ SOSY
PREFILLED_SYRINGE | INTRAMUSCULAR | Status: AC
  Filled 2023-11-21: qty 2

## 2023-11-21 MED ORDER — FUROSEMIDE 10 MG/ML IJ SOLN
40.0000 mg | Freq: Once | INTRAMUSCULAR | Status: AC
  Administered 2023-11-21: 40 mg via INTRAVENOUS
  Filled 2023-11-21: qty 4

## 2023-11-21 NOTE — Progress Notes (Addendum)
 Pt noted to have increased work of breathing, displaying pursed lip breathing, increased abdominal and accessory muscle use. Pt is currently maintaining his sat of 93% on 3L [place], however is no longer able to talk in full sentences and is clearly uncomfortable. Pt given PRN duoneb per MAR, and scheduled IV Lasix. Dr Ashok Pall notified via epic chat and came to bedside to see patient. See new orders. Deep suctioning performed by this RN, pt did not tolerate well. John CN at bedside to attempt deep suctioning as well.   Addendum 0930- patient placed on bipap per Dr Ashok Pall, pt tolerating well at this time.

## 2023-11-21 NOTE — Plan of Care (Signed)

## 2023-11-21 NOTE — TOC Progression Note (Signed)
 Transition of Care [facility]) - Progression Note    Patient Details  Name: Steven Elliott MRN: [id number] Date of Birth: 12-28-1929  Transition of [facility]) CM/SW Contact  Bing Quarry, RN Phone Number: 11/21/2023, 10:27 AM  Clinical Narrative:   3/2: Was to speak to patient re STR, but had abrupt worsening of SOB/respiratory effort this am, unable to speak due to SOB, getting treatment. RR was called on 10/2723.    Gabriel Cirri MSN RN CM  RN Case Manager [place]  Transitions of Care Direct Dial: [phone] (Weekends Only) [facility] Main Office Phone: [phone] [facility] Fax: [phone] [email or web address]          Expected Discharge Plan and Services                                               Social Determinants of Health (SDOH) Interventions SDOH Screenings   Food Insecurity: Patient Declined (11/17/2023)  Housing: Patient Declined (11/17/2023)  Transportation Needs: Patient Declined (11/17/2023)  Utilities: Patient Declined (11/17/2023)  Social Connections: Unknown (11/17/2023)  Tobacco Use: Medium Risk (11/17/2023)    Readmission Risk Interventions    09/09/2023    1:41 PM  Readmission Risk Prevention Plan  Transportation Screening Complete  PCP or Specialist Appt within 3-5 Days Complete  HRI or Home Care Consult Complete  Social Work Consult for Recovery Care Planning/Counseling Complete  Palliative Care Screening Complete  Medication Review [employer]) Complete

## 2023-11-21 NOTE — Progress Notes (Addendum)
 PROGRESS NOTE       Steven Elliott   [id number]:[id number] DOB: 1929/12/01 DOA: 09/08/2023 [facility]: [facility]    156A/156A-AA  LOS: 17 days    Brief hospital course:   Steven Elliott is a 89 y.o. male with medical history significant of dementia, chronic kidney disease, COPD, hyponatremia prostate cancer presenting with acute respiratory failure with hypoxia, COPD exacerbation, SIRS, weakness.  History primarily from patient's daughter in the setting of decompensated respiratory failure.  Patient noted to have been found shortness of breath with generalized rhonchi at home by EMS.  Patient noted to be sick x 3 days with URI symptoms.  Positive sick contact with similar symptoms pending evaluation today.  No fevers or chills.  Noted recent admission December 18 through January 6 for issues including closed wedge compression fracture of the T11 vertebrae as well as aspiration pneumonia.  Patient otherwise stable inpatient rehab course per the daughter.  No reported falls no worsening weakness.  No abdominal pain.  No reported nausea or vomiting.  Symptoms had sudden onset.  No reported diarrhea.  Per the daughter, patient does not know how to properly use inhalers associate with COPD at home.    Assessment & Plan: Here with influenza/copd exacerbation  Influenza A - continue tamiflu  Acute hypoxic respiratory failure Rapid response 2/27  morning for dyspnea, improved on bipap now weaned to 2-3 liters. This morning abrupt worsening in breathing, now pursed lip breathing, rhonchorous breath sounds. No apparent aspiration event. No chest pain - cxr, consider CT - will attempt airway suctioning - vbg - continue diuresis  Hypokalemia Resolved - monitor  Copd with exacerbation 2/2 flu. Still wheezing - methylpred>prednisone - duonebs standing   Baseline dementia At baseline currently - home memantine  HFmrEF with mild exacerbation Volume up clinically, bnp elevated. Out   liters net. Ef 40-45 on tte, this is a new problem, w/ regional wall motion abnormalities though no chest pain and only minimal trop elevation. Have reviewed w/ dr. Graciela Husbands of cardiology, he advises no additional w/u for the time being - continue IV diuresis, 80 this morning as above   T11-12 fractures status post fall  -underlying ankylosing spondylitis Recent hospitalization for this, s/p rehab stay - PT advising snf, toc consulted   Anemia of chronic disease Stable H&H, monitor   Essential (primary) hypertension BP low normal, not on meds at home - monitor   Cerebral aneurysm, nonruptured CT head recent hospitalization shows stable aneurysm   Prostate cancer metastatic to bone (HCC) --cont home Nubeqa      DVT prophylaxis: Lovenox SQ Code Status: dnr/dni Family Communication: daughter updated at bedside 3/2 Level of care: Med-Surg Dispo:   The patient is from: home Anticipated d/c is to: snf (TOC has been consulted)     Subjective and Interval History:  Worsening wheeze and dyspnea this morning       Constitutional: NAD, in bed   HEENT: atraumatic CV: tachycardic, rr RESP: exp wheeze, coarse breath sounds, pursed lip breathing Neuro: moving all 4 Psych: calm,  Skin: no visible lesions     Data Reviewed: I have personally reviewed labs and imaging studies    CRITICAL CARE Performed by: Silvano Bilis   Total critical care time: 48 minutes  Critical care time was exclusive of separately billable procedures and treating other patients.  Critical care was necessary to treat or prevent imminent or life-threatening deterioration.  Critical care was time spent personally by me on the  following activities: development of treatment plan with patient and/or surrogate as well as nursing, discussions with consultants, evaluation of patient's response to treatment, examination of patient, obtaining history from patient or surrogate, ordering and performing treatments and  interventions, ordering and review of laboratory studies, ordering and review of radiographic studies, pulse oximetry and re-evaluation of patient's condition.        Silvano Bilis, MD [facility] If 7PM-7AM, please contact night-coverage 09/26/2023, 3:53 PM

## 2023-11-22 ENCOUNTER — Inpatient Hospital Stay

## 2023-11-22 DIAGNOSIS — J441 Chronic obstructive pulmonary disease with (acute) exacerbation: Secondary | ICD-10-CM | POA: Diagnosis not present

## 2023-11-22 LAB — BASIC METABOLIC PANEL
Anion gap: 8 (ref 5–15)
BUN: 16 mg/dL (ref 8–23)
CO2: 32 mmol/L (ref 22–32)
Calcium: 7.6 mg/dL — ABNORMAL LOW (ref 8.9–10.3)
Chloride: 99 mmol/L (ref 98–111)
Creatinine, Ser: 0.7 mg/dL (ref 0.61–1.24)
GFR, Estimated: >60 mL/min (ref >60)
Glucose, Bld: 92 mg/dL (ref 70–99)
Potassium: 3.2 mmol/L — ABNORMAL LOW (ref 3.5–5.1)
Sodium: 139 mmol/L (ref 135–145)

## 2023-11-22 LAB — CULTURE, BLOOD (ROUTINE X 2)
Culture: NO GROWTH
Culture: NO GROWTH
Special Requests: ADEQUATE

## 2023-11-22 LAB — MAGNESIUM: Magnesium: 1.9 mg/dL (ref 1.7–2.4)

## 2023-11-22 LAB — BLOOD GAS, VENOUS
Acid-Base Excess: 4.8 mmol/L — ABNORMAL HIGH (ref 0.0–2.0)
Bicarbonate: 30.4 mmol/L — ABNORMAL HIGH (ref 20.0–28.0)
O2 Content: 2 L/min
O2 Saturation: 49.6 %
Patient temperature: 37
pCO2, Ven: 48 mmHg (ref 44–60)
pH, Ven: 7.41 (ref 7.25–7.43)

## 2023-11-22 MED ORDER — POTASSIUM CHLORIDE 20 MEQ PO PACK
60.0000 meq | PACK | Freq: Once | ORAL | Status: AC
  Administered 2023-11-22: 60 meq via ORAL
  Filled 2023-11-22: qty 3

## 2023-11-22 MED ORDER — IOHEXOL 350 MG/ML SOLN
75.0000 mL | Freq: Once | INTRAVENOUS | Status: AC | PRN
  Administered 2023-11-22: 75 mL via INTRAVENOUS

## 2023-11-22 NOTE — Progress Notes (Signed)
 PROGRESS NOTE       Steven Elliott   ZOX:[id number] DOB: [birth year] DOA: 09/08/2023 [facility]: [facility]    156A/156A-AA  LOS: 17 days    Brief hospital course:   Steven Elliott is a 89 y.o. male with medical history significant of dementia, chronic kidney disease, COPD, hyponatremia prostate cancer presenting with acute respiratory failure with hypoxia, COPD exacerbation, SIRS, weakness.  History primarily from patient's daughter in the setting of decompensated respiratory failure.  Patient noted to have been found shortness of breath with generalized rhonchi at home by EMS.  Patient noted to be sick x 3 days with URI symptoms.  Positive sick contact with similar symptoms pending evaluation today.  No fevers or chills.  Noted recent admission December 18 through January 6 for issues including closed wedge compression fracture of the T11 vertebrae as well as aspiration pneumonia.  Patient otherwise stable inpatient rehab course per the daughter.  No reported falls no worsening weakness.  No abdominal pain.  No reported nausea or vomiting.  Symptoms had sudden onset.  No reported diarrhea.  Per the daughter, patient does not know how to properly use inhalers associate with COPD at home.    Assessment & Plan: Here with influenza/copd exacerbation  Influenza A S/p 5 day course tamiflu  Acute hypoxic respiratory failure Rapid response 2/27  morning for dyspnea, improved on bipap now weaned to 2-3 liters. Again required bipap for respiratory distress morning of 3/2. Now stable. CTA this morning no PE or other complications but severe baseline emphysema - continue [place] O2  Hypokalemia Replete  Copd with exacerbation 2/2 flu. Still wheezing - methylpred>prednisone - duonebs standing   Baseline dementia At baseline currently - home memantine  HFmrEF with mild exacerbation Volume up clinically, bnp elevated.  Ef 40-45 on tte, this is a new problem, w/ regional wall motion  abnormalities though no chest pain and only minimal trop elevation. Have reviewed w/ dr. Graciela Husbands of cardiology, he advises no additional w/u for the time being - will hold diuresis today given contrast this morning, resume tomorrow if kidney function stable   T11-12 fractures status post fall  -underlying ankylosing spondylitis Recent hospitalization for this, s/p rehab stay - PT advising snf, toc consulted   Anemia of chronic disease Stable H&H, monitor   Essential (primary) hypertension BP low normal, not on meds at home - monitor   Cerebral aneurysm, nonruptured CT head recent hospitalization shows stable aneurysm   Prostate cancer metastatic to bone (HCC) --cont home Nubeqa      DVT prophylaxis: Lovenox SQ Code Status: dnr/dni Family Communication: daughters updated at bedside 3/3 Level of care: Med-Surg Dispo:   The patient is from: home Anticipated d/c is to: snf (TOC has been consulted)     Subjective and Interval History:  Breathing stable, ate full breakfast this morning       Constitutional: NAD, in bed   HEENT: atraumatic CV: rrr RESP: exp wheeze, normal wob Neuro: moving all 4 Psych: calm,  Skin: no visible lesions     Data Reviewed: I have personally reviewed labs and imaging studies       Silvano Bilis, MD [facility] If 7PM-7AM, please contact night-coverage 09/26/2023, 3:53 PM

## 2023-11-22 NOTE — Plan of Care (Signed)
   Problem: Education: Goal: Knowledge of General Education information will improve Description Including pain rating scale, medication(s)/side effects and non-pharmacologic comfort measures Outcome: Progressing

## 2023-11-23 ENCOUNTER — Ambulatory Visit: Payer: No Typology Code available for payment source | Admitting: Neurosurgery

## 2023-11-23 DIAGNOSIS — J441 Chronic obstructive pulmonary disease with (acute) exacerbation: Secondary | ICD-10-CM | POA: Diagnosis not present

## 2023-11-23 DIAGNOSIS — J9601 Acute respiratory failure with hypoxia: Secondary | ICD-10-CM | POA: Diagnosis not present

## 2023-11-23 DIAGNOSIS — Z515 Encounter for palliative care: Secondary | ICD-10-CM | POA: Diagnosis not present

## 2023-11-23 LAB — CBC
HCT: 33.9 % — ABNORMAL LOW (ref 39.0–52.0)
Hemoglobin: 11.5 g/dL — ABNORMAL LOW (ref 13.0–17.0)
MCH: 33.9 pg (ref 26.0–34.0)
MCHC: 33.9 g/dL (ref 30.0–36.0)
MCV: 100 fL (ref 80.0–100.0)
Platelets: 256 10*3/uL (ref 150–400)
RBC: 3.39 MIL/uL — ABNORMAL LOW (ref 4.22–5.81)
RDW: 18.6 % — ABNORMAL HIGH (ref 11.5–15.5)
WBC: 5.6 10*3/uL (ref 4.0–10.5)
nRBC: 0 % (ref 0.0–0.2)

## 2023-11-23 LAB — BASIC METABOLIC PANEL
Anion gap: 8 (ref 5–15)
BUN: 16 mg/dL (ref 8–23)
CO2: 30 mmol/L (ref 22–32)
Calcium: 8 mg/dL — ABNORMAL LOW (ref 8.9–10.3)
Chloride: 100 mmol/L (ref 98–111)
Creatinine, Ser: 0.66 mg/dL (ref 0.61–1.24)
GFR, Estimated: >60 mL/min (ref >60)
Glucose, Bld: 87 mg/dL (ref 70–99)
Potassium: 4 mmol/L (ref 3.5–5.1)
Sodium: 138 mmol/L (ref 135–145)

## 2023-11-23 MED ORDER — PREDNISONE 20 MG PO TABS
30.0000 mg | ORAL_TABLET | Freq: Every day | ORAL | Status: DC
  Administered 2023-11-24 – 2023-11-29 (×6): 30 mg via ORAL
  Filled 2023-11-23 (×6): qty 1

## 2023-11-23 MED ORDER — FUROSEMIDE 10 MG/ML IJ SOLN
60.0000 mg | Freq: Once | INTRAMUSCULAR | Status: AC
  Administered 2023-11-23: 60 mg via INTRAVENOUS
  Filled 2023-11-23: qty 6

## 2023-11-23 NOTE — Progress Notes (Addendum)
 PROGRESS NOTE       Steven Elliott   ZOX:[id number] DOB: 06-May-1930 DOA: 09/08/2023 [facility]: [facility]    156A/156A-AA  LOS: 17 days    Brief hospital course:   Steven Elliott is a 89 y.o. male with medical history significant of dementia, chronic kidney disease, COPD, hyponatremia prostate cancer presenting with acute respiratory failure with hypoxia, COPD exacerbation, SIRS, weakness.  History primarily from patient's daughter in the setting of decompensated respiratory failure.  Patient noted to have been found shortness of breath with generalized rhonchi at home by EMS.  Patient noted to be sick x 3 days with URI symptoms.  Positive sick contact with similar symptoms pending evaluation today.  No fevers or chills.  Noted recent admission December 18 through January 6 for issues including closed wedge compression fracture of the T11 vertebrae as well as aspiration pneumonia.  Patient otherwise stable inpatient rehab course per the daughter.  No reported falls no worsening weakness.  No abdominal pain.  No reported nausea or vomiting.  Symptoms had sudden onset.  No reported diarrhea.  Per the daughter, patient does not know how to properly use inhalers associate with COPD at home.    Assessment & Plan: Here with influenza/copd exacerbation  Influenza A S/p 5 day course tamiflu  Acute hypoxic respiratory failure Rapid response 2/27  morning for dyspnea, improved on bipap now weaned to 2-3 liters. Again required bipap for respiratory distress morning of 3/2. Now stable. CTA 3/3 no PE or other complications but severe baseline emphysema. No baseline hypercarbia to qualify for bipap. Discuss with pulm today, given no osa diagnosis also won't qualify for cpap at discharge. - continue [place] O  Hypokalemia resolved  Copd with exacerbation 2/2 flu. Still wheezing - methylpred>prednisone, start to wean tomorrow, 30 ordered - duonebs standing   Baseline dementia At baseline  currently - home memantine  HFmrEF with mild exacerbation Volume up clinically, bnp elevated.  Ef 40-45 on tte, this is a new problem, w/ regional wall motion abnormalities though no chest pain and only minimal trop elevation. Have reviewed w/ dr. Graciela Husbands of cardiology, he advises no additional w/u for the time being - resume diuresis lasix 60 - message to nursing about recording accurate I/os   T11-12 fractures status post fall  -underlying ankylosing spondylitis Recent hospitalization for this, s/p rehab stay - PT advising snf, toc consulted   Anemia of chronic disease Stable H&H, monitor   Essential (primary) hypertension BP low normal, not on meds at home - monitor   Cerebral aneurysm, nonruptured CT head recent hospitalization shows stable aneurysm   Prostate cancer metastatic to bone (HCC) --cont home Nubeqa      DVT prophylaxis: Lovenox SQ Code Status: dnr/dni Family Communication: daughter updated at bedside 3/4 Level of care: Med-Surg Dispo:   The patient is from: home Anticipated d/c is to: snf (TOC has been consulted)     Subjective and Interval History:  Breathing stable, ate full breakfast this morning. Some dyspnea overnight       Constitutional: NAD, in bed   HEENT: atraumatic CV: rrr RESP: exp wheeze,mild tachypnea Neuro: moving all 4 Psych: calm,  Skin: no visible lesions     Data Reviewed: I have personally reviewed labs and imaging studies       Silvano Bilis, MD [facility] If 7PM-7AM, please contact night-coverage 09/26/2023, 3:53 PM

## 2023-11-23 NOTE — Plan of Care (Signed)

## 2023-11-23 NOTE — Progress Notes (Signed)
 Palliative Care Progress Note, Assessment & Plan   Patient Name: Steven Elliott       Date: 11/23/2023 DOB: 02-Feb-1930  Age: 89 y.o. MRN#: [id number] Attending Physician: Kathrynn Running, MD Primary Care Physician: [facility] Admit Date: 11/17/2023  Subjective: Patient is out of bed and sitting in recliner.  Nasal cannula is in place.  He is awake, alert, acknowledges my presence.  He is able to make his wishes known.  His daughter [name] and RN Wille Celeste are at bedside during my visit.  HPI: 89 y.o. male  with past medical history of dementia, CKD, COPD, hyponatremia, prostate cancer admitted on 11/17/2023 with complaints of shortness of breath and generalized weakness.   Patient is being treated for acute respiratory failure with hypoxia, COPD exacerbation, cellulitis, and weakness.   Radiograph of chest revealed COPD with bibasilar atelectasis/infiltrates.  CT of chest revealed no evidence of pulmonary embolism, small bilateral pleural effusions (left greater than right) with associated bilateral lower lobe atelectasis, aortic atherosclerosis, and emphysema.   PMT was consulted to support patient and family with goals of care conversations.  Summary of counseling/coordination of care: Extensive chart review completed prior to meeting patient including labs, vital signs, imaging, progress notes, orders, and available advanced directive documents from current and previous encounters.   After reviewing the patient's chart and assessing the patient at bedside, I spoke with patient in regards to symptom management and goals of care.   Patient endorses that he has had some difficulty breathing but that it is "much better now".  He is practicing pursed lipped breathing.  He denies respiratory  distress or chest pain at this time.  No adjustment to [facility] needed at this time.  I attempted to elicit values and goals important to the patient.  I counseled with the patient and daughter at bedside in regards to advance care planning.  When asked his thoughts on whether or not he would be accepting of a ventilator or artificial means to sustain his life, patient responded, "absolutely not.  I just use my mouth!".  He laughed.    I clarified that patient is stating that he would never be accepting of ventilatory support as well as a feeding tube or IV fluids to sustain him artificially.  Patient nodded his head and said yes in agreement.  In light of patient likely transferring to another medical facility, introduced the concept of completing a MOST form to make patient's above wishes known.  Daughter at bedside shares that patient has already completed a MOST form at [facility].  However, she would like to complete one with him today and ensure that it is the most updated form on his record.  I completed a MOST form today. The patient outlined his wishes for the following treatment decisions:  Cardiopulmonary Resuscitation: Do Not Attempt Resuscitation (DNR/No CPR)  Medical Interventions: Limited Additional Interventions: Use medical treatment, IV fluids and cardiac monitoring as indicated, DO NOT USE intubation or mechanical ventilation. May consider use of less invasive airway support such as BiPAP or CPAP. Also provide comfort measures. Transfer to the hospital if indicated. Avoid intensive care.   Antibiotics: Antibiotics if indicated  IV Fluids: IV  fluids for a defined trial period  Feeding Tube: No feeding tube    Original pink form placed in patient's shadow chart and copy of MOST form sent to medical records for download to ACP tab in epic.  [place] provided for patient to share his thoughts and emotions regarding current medical situation.  He shares that he is "doing just fine right  now".  Daughter at bedside has no acute palliative concerns.  Goals are clear.  DNR with limited inventions remains.  MOST has been completed.  TOC following closely for discharge planning.  PMT will step back from daily visits.  Patient and family made aware that should acute palliative needs arise that they could reach out to PMT.  Please reengage with PMT if goals change, at patient/family's request, or if patient's health deteriorates during hospitalization.  Otherwise, we are monitor patient peripherally.  Physical Exam Vitals reviewed.  Constitutional:      General: He is not in acute distress.    Appearance: He is normal weight. He is not ill-appearing.  HENT:     Mouth/Throat:     Mouth: Mucous membranes are moist.  Eyes:     Pupils: Pupils are equal, round, and reactive to light.  Cardiovascular:     Rate and Rhythm: Normal rate.  Pulmonary:     Effort: Pulmonary effort is normal.     Breath sounds: Examination of the right-lower field reveals decreased breath sounds. Examination of the left-lower field reveals decreased breath sounds. Decreased breath sounds present.  Musculoskeletal:        General: Normal range of motion.     Comments: MAETC  Skin:    General: Skin is warm and dry.  Neurological:     Mental Status: He is alert.  Psychiatric:        Mood and Affect: Mood normal. Mood is not anxious.        Behavior: Behavior normal. Behavior is not agitated.             Total Time 50 minutes   Time spent includes: Detailed review of medical records (labs, imaging, vital signs), medically appropriate exam (mental status, respiratory, cardiac, skin), discussed with treatment team, counseling and educating patient, family and staff, documenting clinical information, medication management and coordination of care.  [name] L. Bonita Quin, DNP, FNP-BC Palliative Medicine Team

## 2023-11-23 NOTE — Progress Notes (Signed)
 Physical Therapy Treatment Patient Details Name: Steven Elliott MRN: [id number] DOB: 1930-03-20 Today's Date: 11/23/2023   History of Present Illness Patient is a 89 year old male with complaints of shortness of breath and generalized weakness, Found to have COPD exacerbation and influenza A. History of dementia, CKD, COPD, hyponatremia, prostate cancer, T1-12 fractures s/p fall, ankylosing spondylitis    PT Comments  Pt was long sitting in bed upon arrival. He is alert and oriented x 2 but HOH. Author questions if pt truly understands overall current situation. Both pt/pt's daughter are still wanting STR/SNF at [place]. MD in room prior to session. Pt needs max encouragement to participate in OOB activity. Eventually agrees to OOB with assistance. PT is severely deconditioned. SOB noted with just sitting up EOB. Sao2 stable on [place] O2. Pt requires prolonged recovery breaks due to SOB. Pt was able to tolerate standing and taking very slow, shuffling steps to recliner. Again, Pt is SOB with prolonged rest required. DC recs remain appropriate due to pt's goals and need for skilled PT going forward to maximize his independence and safety with all ADLs.    If plan is discharge home, recommend the following: Two people to help with walking and/or transfers;Two people to help with bathing/dressing/bathroom;Help with stairs or ramp for entrance;Assist for transportation;Supervision due to cognitive status;Assistance with cooking/housework;Assistance with feeding;Direct supervision/assist for medications management;Direct supervision/assist for financial management     Equipment Recommendations  None recommended by PT       Precautions / Restrictions Precautions Precautions: Fall Recall of Precautions/Restrictions: Impaired Required Braces or Orthoses: Spinal Brace Spinal Brace: Thoracolumbosacral orthotic ((when OOB mobilizing)) Restrictions Weight Bearing Restrictions Per Provider Order: No      Mobility  Bed Mobility Overal bed mobility: Needs Assistance Bed Mobility: Supine to Sit, Rolling Rolling: Mod assist Supine to sit: Max assist, Used rails  General bed mobility comments: Max assist required to exit L side of bed. Increased time. SOB noted with very minimal activity. sat EOB x several minutes prior to standing and taking steps over to recliner.    Transfers Overall transfer level: Needs assistance Equipment used: Rolling walker (2 wheels) Transfers: Sit to/from Stand Sit to Stand: Mod assist, Max assist  General transfer comment: pt was able to stand EOB and take ~ 5 steps to pivot/step to recliner. very slow moving. pt endorses severe SOB with HR elevation to 119bpm. prolonged recovery time to control SOB. Overall pt severely deconditioned and limited by poor activity tolerance    Ambulation/Gait  General Gait Details: pt was able to take stesp from EOB to recliner with slow shuffling flexed gait posture. reliant on RW for all standing support. pt was wearing TLSO with transfers and OOB to recliner however removed per family request once in recliner.    Balance Overall balance assessment: Needs assistance Sitting-balance support: Feet supported, Bilateral upper extremity supported Sitting balance-Leahy Scale: Fair     Standing balance support: During functional activity, Reliant on assistive device for balance, Bilateral upper extremity supported Standing balance-Leahy Scale: Poor Standing balance comment: pt remain high fall risk due to his very limited activity tolerance and SOB with minimal activity     Communication Communication Communication: Impaired Factors Affecting Communication: Hearing impaired  Cognition Arousal: Alert Behavior During Therapy: WFL for tasks assessed/performed   PT - Cognitive impairments: History of cognitive impairments      PT - Cognition Comments: Pt is alert and orieted x 2. Agreeable to session. seems to lack some  insight  of overall current situation and his deficits. Following commands: Intact      Cueing Cueing Techniques: Verbal cues, Gestural cues, Tactile cues         Pertinent Vitals/Pain Pain Assessment Pain Assessment: 0-10 Pain Score: 5  Pain Intervention(s): Limited activity within patient's tolerance, Monitored during session, Premedicated before session, Repositioned     PT Goals (current goals can now be found in the care plan section) Acute Rehab PT Goals Patient Stated Goal: rehab then home Progress towards PT goals: Progressing toward goals    Frequency    Min 1X/week       AM-PAC PT "6 Clicks" Mobility   Outcome Measure  Help needed turning from your back to your side while in a flat bed without using bedrails?: A Lot Help needed moving from lying on your back to sitting on the side of a flat bed without using bedrails?: A Lot Help needed moving to and from a bed to a chair (including a wheelchair)?: A Lot Help needed standing up from a chair using your arms (e.g., wheelchair or bedside chair)?: A Lot Help needed to walk in hospital room?: A Lot Help needed climbing 3-5 steps with a railing? : Total 6 Click Score: 11    End of Session   Activity Tolerance: Patient tolerated treatment well Patient left: in chair;with call bell/phone within reach;with chair alarm set;with family/visitor present Nurse Communication: Mobility status PT Visit Diagnosis: Muscle weakness (generalized) (M62.81);Unsteadiness on feet (R26.81)     Time: 1610-9604 PT Time Calculation (min) (ACUTE ONLY): 20 min  Charges:    $Therapeutic Activity: 8-22 mins PT General Charges $$ ACUTE PT VISIT: 1 Visit                     Jetta Lout PTA 11/23/23, 1:42 PM

## 2023-11-23 NOTE — Progress Notes (Signed)
 Patients daughter called to the desk, states she wants patients BiPAP off. Patient was asleep, no S/S of distress. Daughter states she only wants it on for 3 hours. Respiratory at bedside.

## 2023-11-23 NOTE — Plan of Care (Signed)
  Problem: Clinical Measurements: Goal: Ability to maintain clinical measurements within normal limits will improve Outcome: Progressing   Problem: Clinical Measurements: Goal: Respiratory complications will improve Outcome: Progressing   Problem: Clinical Measurements: Goal: Cardiovascular complication will be avoided Outcome: Progressing   Problem: Activity: Goal: Risk for activity intolerance will decrease Outcome: Progressing   

## 2023-11-24 DIAGNOSIS — J441 Chronic obstructive pulmonary disease with (acute) exacerbation: Secondary | ICD-10-CM | POA: Diagnosis not present

## 2023-11-24 MED ORDER — FUROSEMIDE 40 MG PO TABS
40.0000 mg | ORAL_TABLET | Freq: Every day | ORAL | Status: DC
Start: 2023-11-24 — End: 2023-12-03
  Administered 2023-11-24 – 2023-12-03 (×10): 40 mg via ORAL
  Filled 2023-11-24 (×10): qty 1

## 2023-11-24 NOTE — Progress Notes (Signed)
 PROGRESS NOTE       Steven Elliott   VWU:[id number] DOB: 08-13-1930 DOA: 09/08/2023 PCP: [facility]    156A/156A-AA  LOS: 17 days    Brief hospital course:   Steven Elliott is a 89 y.o. male with medical history significant of dementia, chronic kidney disease, COPD, hyponatremia prostate cancer presenting with acute respiratory failure with hypoxia, COPD exacerbation, SIRS, weakness.  History primarily from patient's daughter in the setting of decompensated respiratory failure.  Patient noted to have been found shortness of breath with generalized rhonchi at home by EMS.  Patient noted to be sick x 3 days with URI symptoms.  Positive sick contact with similar symptoms pending evaluation today.  No fevers or chills.  Noted recent admission December 18 through January 6 for issues including closed wedge compression fracture of the T11 vertebrae as well as aspiration pneumonia.  Patient otherwise stable inpatient rehab course per the daughter.  No reported falls no worsening weakness.  No abdominal pain.  No reported nausea or vomiting.  Symptoms had sudden onset.  No reported diarrhea.  Per the daughter, patient does not know how to properly use inhalers associate with COPD at home.    Assessment & Plan: Here with influenza/copd exacerbation  Influenza A S/p 5 day course tamiflu  Acute hypoxic respiratory failure Rapid response 2/27  morning for dyspnea, improved on bipap now weaned to 2-3 liters. Again required bipap for respiratory distress morning of 3/2. Now stable. CTA 3/3 no PE or other complications but severe baseline emphysema. No baseline hypercarbia to qualify for bipap. Discuss with pulm today, given no osa diagnosis also won't qualify for cpap at discharge. - continue [place] O  Hypokalemia resolved  Copd with exacerbation 2/2 flu. Still wheezing - methylpred>prednisone, have started wean today at 30 daily - duonebs standing   Baseline dementia At baseline  currently - home memantine  HFmrEF with mild exacerbation Volume up clinically, bnp elevated.  Ef 40-45 on tte, this is a new problem, w/ regional wall motion abnormalities though no chest pain and only minimal trop elevation. Have reviewed w/ dr. Graciela Husbands of cardiology, he advises no additional w/u for the time being - will transition to oral lasix today 40 daily   T11-12 fractures status post fall  -underlying ankylosing spondylitis Recent hospitalization for this, s/p rehab stay - PT advising snf, toc consulted   Anemia of chronic disease Stable H&H, monitor   Essential (primary) hypertension BP low normal, not on meds at home - monitor   Cerebral aneurysm, nonruptured CT head recent hospitalization shows stable aneurysm   Prostate cancer metastatic to bone (HCC) --cont home N[place]a      DVT prophylaxis: Lovenox SQ Code Status: dnr/dni Family Communication: daughter updated at bedside 3/5 Level of care: Med-Surg Dispo:   The patient is from: home Anticipated d/c is to: snf (TOC has been consulted)     Subjective and Interval History:  More sleepy today, bipap for about an hour overnight, breathing is stable       Constitutional: NAD, in bed   HEENT: atraumatic CV: rrr RESP: exp wheeze,mild tachypnea Neuro: moving all 4 Psych: calm,  Skin: no visible lesions     Data Reviewed: I have personally reviewed labs and imaging studies       Silvano Bilis, MD [facility] If 7PM-7AM, please contact night-coverage 09/26/2023, 3:53 PM

## 2023-11-24 NOTE — Plan of Care (Signed)
   Problem: Activity: Goal: Risk for activity intolerance will decrease Outcome: Progressing   Problem: Nutrition: Goal: Adequate nutrition will be maintained Outcome: Progressing   Problem: Coping: Goal: Level of anxiety will decrease Outcome: Progressing   Problem: Pain Managment: Goal: General experience of comfort will improve and/or be controlled Outcome: Progressing

## 2023-11-24 NOTE — TOC Progression Note (Addendum)
 Transition of Care [facility]) - Progression Note    Patient Details  Name: Steven Elliott MRN: [id number] Date of Birth: [birth year]  Transition of Care [facility]) CM/SW Contact  Truddie Hidden, RN Phone Number: 11/24/2023, 3:51 PM  Clinical Narrative:    Spoke with patient's daughter, [name] regarding SNF. Patient is in his co-payment days. [name] does not wish for patient to return to [place]. She is agreeable to SNF and does not want [facility]. She was advised to be prepared to make a copayment. [name] stated she was unaware that patient's days does not start over related to a new admission. RNCM advised patient SNF days will resume from the day he left the facility. [name] was advised patient has to be facility free 60 days for new benefit period.   FL2 completed.  Bed search initiated.          Expected Discharge Plan and Services                                               Social Determinants of Health (SDOH) Interventions SDOH Screenings   Food Insecurity: Patient Declined (11/17/2023)  Housing: Patient Declined (11/17/2023)  Transportation Needs: Patient Declined (11/17/2023)  Utilities: Patient Declined (11/17/2023)  Social Connections: Unknown (11/17/2023)  Tobacco Use: Medium Risk (11/17/2023)    Readmission Risk Interventions    09/09/2023    1:41 PM  Readmission Risk Prevention Plan  Transportation Screening Complete  PCP or Specialist Appt within 3-5 Days Complete  HRI or Home Care Consult Complete  Social Work Consult for Recovery Care Planning/Counseling Complete  Palliative Care Screening Complete  Medication Review Oceanographer) Complete

## 2023-11-24 NOTE — Progress Notes (Signed)
 Patient placed on BIPAP per family at bedside request.

## 2023-11-24 NOTE — [place] FL2 (Signed)
 [place] MEDICAID FL2 LEVEL OF CARE FORM     IDENTIFICATION  Patient Name: Steven Elliott Birthdate: Oct 10, 1929 Sex: male Admission Date (Current Location): 11/17/2023  [facility] and [place] Number:  Chiropodist and Address:  [facility], [address], [place], [place] [address]      Provider Number: [id number]  Attending Physician Name and Address:  Kathrynn Running, MD  Relative Name and Phone Number:  [name] (Daughter)  [phone] [facility])    Current Level of Care: Hospital Recommended Level of Care: Skilled Nursing Facility Prior Approval Number:    Date Approved/Denied:   PASRR Number: [id number]  Discharge Plan: SNF    Current Diagnoses: Patient Active Problem List   Diagnosis Date Noted   Protein-calorie malnutrition, severe 11/19/2023   COPD exacerbation (HCC) 11/17/2023   Acute respiratory failure with hypoxia (HCC) 11/17/2023   SIRS (systemic inflammatory response syndrome) (HCC) 11/17/2023   Elevated troponin 11/17/2023   Pressure injury of skin 11/17/2023   Cerebral aneurysm, nonruptured 09/09/2023   Essential (primary) hypertension 09/09/2023   Oropharyngeal dysphagia 09/09/2023   Anemia 09/09/2023   Fracture dislocation of thoracic spine, closed, initial encounter (HCC) 09/09/2023   Closed wedge compression fracture of T11 vertebra (HCC) 09/09/2023   Compression fracture of T12 vertebra (HCC) 09/09/2023   Fall 09/08/2023   Orthostatic hypotension 05/21/2022   Wheeze 05/21/2022   Hyponatremia 05/21/2022   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/20/2022   Acute urinary retention 05/20/2022   Urethritis 05/11/2022   Balanitis 05/10/2022   UTI (urinary tract infection) 05/10/2022   BPH (benign prostatic hyperplasia)    Dementia (HCC)    Abnormal LFTs    COPD (chronic obstructive pulmonary disease) (HCC)    Chronic kidney disease, stage 3a (HCC)    Pneumoperitoneum    Diverticular hemorrhage  03/22/2016   Abdominal aortic aneurysm (HCC) 03/22/2016   Rectal bleeding 03/20/2016    Orientation RESPIRATION BLADDER Height & Weight     Self, Place  O2 (023L) Incontinent Weight: 65.7 kg Height:  5\' 8"  (172.7 cm)  BEHAVIORAL SYMPTOMS/MOOD NEUROLOGICAL BOWEL NUTRITION STATUS  Other (Comment) (n/a)  (n/a) Incontinent Diet  AMBULATORY STATUS COMMUNICATION OF NEEDS Skin   Limited Assist Verbally PU Stage and Appropriate Care (eechymosis and erythema) PU Stage 1 Dressing:  (prn)                     Personal Care Assistance Level of Assistance  Bathing, Feeding, Dressing Bathing Assistance: Limited assistance Feeding assistance: Limited assistance Dressing Assistance: Limited assistance     Functional Limitations Info  Hearing   Hearing Info: Impaired      SPECIAL CARE FACTORS FREQUENCY  OT (By licensed OT), PT (By licensed PT)     PT Frequency: Min 2x weekly OT Frequency: Min 2x weekly            Contractures Contractures Info: Not present    Additional Factors Info  Code Status, Allergies Code Status Info: DNR Allergies Info: Procaine, Ciprofloxacin, Hydrocodone, Hydrocodone-acetaminophen, Lisinopril, Morphine, Omeprazole, Penicillins, Rabeprazole, Tramadol           Current Medications (11/24/2023):  This is the current hospital active medication list Current Facility-Administered Medications  Medication Dose Route Frequency Provider Last Rate Last Admin   acetaminophen (TYLENOL) tablet 650 mg  650 mg Oral Q6H PRN Kathrynn Running, MD   650 mg at 11/24/23 1130   ascorbic acid (VITAMIN C) tablet 500 mg  500 mg Oral  BID Kathrynn Running, MD   500 mg at 11/24/23 1000   darolutamide (NUBEQA) tablet 600 mg  600 mg Oral BID WC Manuela Schwartz, NP   600 mg at 11/24/23 1130   DULoxetine (CYMBALTA) DR capsule 20 mg  20 mg Oral BID Kathrynn Running, MD   20 mg at 11/24/23 1002   enoxaparin (LOVENOX) injection 40 mg  40 mg Subcutaneous Q24H Floydene Flock,  MD   40 mg at 11/23/23 2140   feeding supplement (ENSURE ENLIVE / ENSURE PLUS) liquid 237 mL  237 mL Oral TID BM Kathrynn Running, MD   237 mL at 11/24/23 1422   furosemide (LASIX) tablet 40 mg  40 mg Oral Daily Kathrynn Running, MD   40 mg at 11/24/23 1421   gabapentin (NEURONTIN) capsule 100 mg  100 mg Oral QHS Kathrynn Running, MD   100 mg at 11/23/23 2141   galantamine (RAZADYNE) tablet 8 mg  8 mg Oral BID Kathrynn Running, MD   8 mg at 11/24/23 1002   ipratropium-albuterol (DUONEB) 0.5-2.5 (3) MG/3ML nebulizer solution 3 mL  3 mL Nebulization Q4H PRN Kathrynn Running, MD   3 mL at 11/24/23 1012   memantine (NAMENDA) tablet 10 mg  10 mg Oral BID Kathrynn Running, MD   10 mg at 11/24/23 1001   multivitamin with minerals tablet 1 tablet  1 tablet Oral Daily Kathrynn Running, MD   1 tablet at 11/24/23 1000   ondansetron (ZOFRAN) tablet 4 mg  4 mg Oral Q6H PRN Floydene Flock, MD       Or   ondansetron [facility]) injection 4 mg  4 mg Intravenous Q6H PRN Floydene Flock, MD       predniSONE (DELTASONE) tablet 30 mg  30 mg Oral Q breakfast Kathrynn Running, MD   30 mg at 11/24/23 1001   tamsulosin (FLOMAX) capsule 0.4 mg  0.4 mg Oral QHS Kathrynn Running, MD   0.4 mg at 11/23/23 2141     Discharge Medications: Please see discharge summary for a list of discharge medications.  Relevant Imaging Results:  Relevant Lab Results:   Additional Information SS# [id number]  Truddie Hidden, [place]

## 2023-11-25 DIAGNOSIS — J441 Chronic obstructive pulmonary disease with (acute) exacerbation: Secondary | ICD-10-CM | POA: Diagnosis not present

## 2023-11-25 LAB — BASIC METABOLIC PANEL
Anion gap: 9 (ref 5–15)
BUN: 16 mg/dL (ref 8–23)
CO2: 32 mmol/L (ref 22–32)
Calcium: 8.2 mg/dL — ABNORMAL LOW (ref 8.9–10.3)
Chloride: 97 mmol/L — ABNORMAL LOW (ref 98–111)
Creatinine, Ser: 0.76 mg/dL (ref 0.61–1.24)
GFR, Estimated: >60 mL/min (ref >60)
Glucose, Bld: 87 mg/dL (ref 70–99)
Potassium: 3.4 mmol/L — ABNORMAL LOW (ref 3.5–5.1)
Sodium: 138 mmol/L (ref 135–145)

## 2023-11-25 MED ORDER — POTASSIUM CHLORIDE CRYS ER 20 MEQ PO TBCR
40.0000 meq | EXTENDED_RELEASE_TABLET | Freq: Once | ORAL | Status: AC
  Administered 2023-11-25: 40 meq via ORAL
  Filled 2023-11-25: qty 2

## 2023-11-25 MED ORDER — LIDOCAINE 5 % EX PTCH
1.0000 | MEDICATED_PATCH | CUTANEOUS | Status: DC
  Administered 2023-11-25: 1 via TRANSDERMAL
  Filled 2023-11-25: qty 1

## 2023-11-25 NOTE — Progress Notes (Addendum)
 PT Cancellation Note  Patient Details Name: Steven Elliott MRN: [id number] DOB: [birth year]   Cancelled Treatment:     PT attempt. Pt was asleep side lying in bed upon arrival. He does arouse but minimally. Pt visible weak and fatigued. Daughter at bedside, pushed/pressed pt for participation but pt unwilling at this time. Pt barely opened eyes but did request author to return at later time.   Author returned for 2nd attempt. RN techs in room assisting with hygiene care after BM. Pt remains lethargic and unsafe to participate in OOB activity at this time.  Rushie Chestnut 11/25/2023, 11:05 AM

## 2023-11-25 NOTE — Progress Notes (Signed)
 Nutrition Follow-up  DOCUMENTATION CODES:   Severe malnutrition in context of chronic illness  INTERVENTION:   -Continue Ensure Enlive po TID, each supplement provides 350 kcal and 20 grams of protein.  -Continue Magic cup TID with meals, each supplement provides 290 kcal and 9 grams of protein  -Continue MVI with minerals daily -Continue 500 mg vitamin C BID  NUTRITION DIAGNOSIS:   Severe Malnutrition related to chronic illness as evidenced by severe fat depletion, severe muscle depletion.  Ongoing  GOAL:   Patient will meet greater than or equal to 90% of their needs  Progressing   MONITOR:   PO intake, Supplement acceptance, Labs, Weight trends, I & O's, Skin  REASON FOR ASSESSMENT:   Malnutrition Screening Tool    ASSESSMENT:   89 y/o male with h/o COPD, dementia, metastatic prostate cancer, HTN, compression fracture, BPH, CKD III, GERD, diverticulosis, PVD and AAA who is admitted with SIRS, flu A and COPD exacerbation.  Reviewed I/O's: +90 ml x 24 hours and -7.8 L since admission  UOP: 150 ml x 24 hours   Spoke with pt and daughter at bedside. Pt eating breakfast at time of visit and offered no complaints- consumed a few bites of omelette, grapes, and 25% of Magic Cup during visit. Per daughter, intake is fair and pt needs a lot of encouragement to eat. Pt is drinking Ensure supplements off an on- consumed 50% of a chocolate Ensure yesterday. Pt has also been eating Magic Cups. Discussed importance of good meal and supplement intake to promote healing. Reviewed regular diet for widest variety of meal selections. Gave family permission to bring in outside foods if desired.   Palliative care following for goals of care. Noted pt is DNR and reports that he would not want a feeding tube. RD would not recommend feeding tube secondary to advanced age dementia; it would not enhance pt's quality of life.   Reviewed wt; pt has experienced a 10% wt loss over the past week.  Which is significant for time frame. Noted fluid status likely playing a role in weight loss (-7.8 L since admission).   Per TOC notes, plan for SNF placement at discharge.   Medications reviewed and include vitamin C, lovenox, lasix, and neurontin.   Labs reviewed: K: 3.4, CBGS: 179.   NUTRITION - FOCUSED PHYSICAL EXAM:  Flowsheet Row Most Recent Value  Orbital Region Severe depletion  Upper Arm Region Moderate depletion  Thoracic and Lumbar Region Moderate depletion  Buccal Region Severe depletion  Temple Region Severe depletion  Clavicle Bone Region Severe depletion  Clavicle and Acromion Bone Region Severe depletion  Scapular Bone Region Moderate depletion  Dorsal Hand Unable to assess  Patellar Region Severe depletion  Anterior Thigh Region Severe depletion  Posterior Calf Region Severe depletion  Edema (RD Assessment) Moderate  Hair Reviewed  Eyes Reviewed  Mouth Reviewed  Skin Reviewed  Nails Reviewed       Diet Order:   Diet Order             Diet regular Room service appropriate? Yes with Assist; Fluid consistency: Thin  Diet effective now                   EDUCATION NEEDS:   Education needs have been addressed  Skin:  Skin Assessment: Reviewed RN Assessment (Stage I back, skin tears)  Last BM:  2/27  Height:   Ht Readings from Last 1 Encounters:  11/18/23 5\' 8"  (1.727 m)  Weight:   Wt Readings from Last 1 Encounters:  11/25/23 66.2 kg    Ideal Body Weight:  70 kg  BMI:  Body mass index is 22.19 kg/m.  Estimated Nutritional Needs:   Kcal:  1800-2100kcal/day  Protein:  90-105g/day  Fluid:  1.8-2.1L/day    Levada Schilling, RD, LDN, CDCES Registered Dietitian III Certified Diabetes Care and Education Specialist If unable to reach this RD, please use "RD Inpatient" group chat on secure chat between hours of 8am-4 pm daily

## 2023-11-25 NOTE — Plan of Care (Signed)
  Problem: Clinical Measurements: Goal: Ability to maintain clinical measurements within normal limits will improve Outcome: Progressing   Problem: Coping: Goal: Level of anxiety will decrease Outcome: Progressing   Problem: Skin Integrity: Goal: Risk for impaired skin integrity will decrease Outcome: Progressing   Problem: Respiratory: Goal: Ability to maintain adequate ventilation will improve Outcome: Progressing

## 2023-11-25 NOTE — Progress Notes (Addendum)
 PROGRESS NOTE       Steven Elliott   ZOX:[id number] DOB: 08/03/1930 DOA: 09/08/2023 [facility]: [facility]    156A/156A-AA  LOS: 17 days    Brief hospital course:   "Steven Elliott is a 89 y.o. male with medical history significant of dementia, chronic kidney disease, COPD, hyponatremia prostate cancer presenting with acute respiratory failure with hypoxia, COPD exacerbation, SIRS, weakness.  History primarily from patient's daughter in the setting of decompensated respiratory failure.  Patient noted to have been found shortness of breath with generalized rhonchi at home by EMS.  Patient noted to be sick x 3 days with URI symptoms.  Positive sick contact with similar symptoms pending evaluation today.  No fevers or chills.  Noted recent admission December 18 through January 6 for issues including closed wedge compression fracture of the T11 vertebrae as well as aspiration pneumonia.  Patient otherwise stable inpatient rehab course per the daughter.  No reported falls no worsening weakness.  No abdominal pain.  No reported nausea or vomiting.  Symptoms had sudden onset.  No reported diarrhea.  Per the daughter, patient does not know how to properly use inhalers associate with COPD at home. "   Assessment & Plan: Admitted with influenza and COPD exacerbation  Influenza A S/p 5 day course tamiflu  Acute hypoxic respiratory failure Rapid response 2/27  morning for dyspnea, improved on bipap now weaned to 2-3 liters. Again required bipap for respiratory distress morning of 3/2. Now stable. CTA 3/3 no PE or other complications but severe baseline emphysema. No baseline hypercarbia to qualify for bipap. Discuss with pulm today, given no osa diagnosis also won't qualify for cpap at discharge. - continue weaning oxygen as tolerated - target sats > 90%  Hypokalemia - recurrent K 3.4 --replacement Kcl ordered --Monitor BMP & replace K as needed  Copd with exacerbation 2/2 flu.  3/6 -  no wheezing on exam - methylpred>prednisone, weaned to 30 mg on 3/5 - continue weaning prednisone - duonebs standing   Baseline dementia At baseline currently - home memantine  HFmrEF with mild exacerbation Volume up clinically, bnp elevated.  Ef 40-45 on tte, this is a new problem, w/ regional wall motion abnormalities though no chest pain and only minimal trop elevation. Have reviewed w/ dr. Graciela Husbands of cardiology, he advises no additional w/u for the time being - will transition to oral lasix today 40 daily   T11-12 fractures status post fall  -underlying ankylosing spondylitis Recent hospitalization for this, s/p rehab stay - PT advising snf, toc consulted   Anemia of chronic disease Stable H&H, monitor   Essential (primary) hypertension BP low normal, not on meds at home - monitor   Cerebral aneurysm, nonruptured CT head recent hospitalization shows stable aneurysm   Prostate cancer metastatic to bone (HCC) --cont home Nubeqa      DVT prophylaxis: Lovenox SQ  Code Status: DNR/DNI  Family Communication: daughter updated at bedside on rounds today 3/6  Level of care: Med-Surg  Dispo:   The patient is from: home Anticipated d/c is to: SNF -  TOC consulted for placement     Subjective and Interval History:  Pt seen with daughter at bedside this AM.  Pt very sleepy, but will respond to combination of verbal and physical stimulus.  He denies any complaints.       Constitutional: NAD, in bed   HEENT: atraumatic CV: rrr RESP: exp wheeze,mild tachypnea Neuro: moving all 4 Psych: calm,  Skin: no  visible lesions     Data Reviewed: I have personally reviewed labs and imaging studies    Notable labs -- K 3.4, Ca 8.2, Cl 97      Esaw Grandchild, DO Internal Medicine [facility] If 7PM-7AM, please contact night-coverage 09/26/2023, 3:53 PM

## 2023-11-25 NOTE — TOC Progression Note (Addendum)
 Transition of Care [facility]) - Progression Note    Patient Details  Name: Steven Elliott MRN: [id number] Date of Birth: [birth year]  [facility] CM/SW Contact  Truddie Hidden, RN Phone Number: 11/25/2023, 9:41 AM  Clinical Narrative:    Attempt to reach Sue Lush in admissions at [facility]. Message sent requesting update on referral sent.  Contacted Crystal in admissions at [facility]. Per Crystal no beds are available.   10:18pm Spoke with Dabe at [name] regarding referral sent. He will review and make a decision.            Expected Discharge Plan and Services                                               Social Determinants of Health (SDOH) Interventions SDOH Screenings   Food Insecurity: Patient Declined (11/17/2023)  Housing: Patient Declined (11/17/2023)  Transportation Needs: Patient Declined (11/17/2023)  Utilities: Patient Declined (11/17/2023)  Social Connections: Unknown (11/17/2023)  Tobacco Use: Medium Risk (11/17/2023)    Readmission Risk Interventions    09/09/2023    1:41 PM  Readmission Risk Prevention Plan  Transportation Screening Complete  PCP or Specialist Appt within 3-5 Days Complete  HRI or Home Care Consult Complete  Social Work Consult for Recovery Care Planning/Counseling Complete  Palliative Care Screening Complete  Medication Review [facility]) Complete

## 2023-11-26 DIAGNOSIS — J441 Chronic obstructive pulmonary disease with (acute) exacerbation: Secondary | ICD-10-CM | POA: Diagnosis not present

## 2023-11-26 LAB — BASIC METABOLIC PANEL
Anion gap: 11 (ref 5–15)
BUN: 17 mg/dL (ref 8–23)
CO2: 28 mmol/L (ref 22–32)
Calcium: 8 mg/dL — ABNORMAL LOW (ref 8.9–10.3)
Chloride: 97 mmol/L — ABNORMAL LOW (ref 98–111)
Creatinine, Ser: 0.69 mg/dL (ref 0.61–1.24)
GFR, Estimated: >60 mL/min (ref >60)
Glucose, Bld: 98 mg/dL (ref 70–99)
Potassium: 3.9 mmol/L (ref 3.5–5.1)
Sodium: 136 mmol/L (ref 135–145)

## 2023-11-26 LAB — BLOOD GAS, VENOUS
Acid-Base Excess: 12.7 mmol/L — ABNORMAL HIGH (ref 0.0–2.0)
Bicarbonate: 39.4 mmol/L — ABNORMAL HIGH (ref 20.0–28.0)
O2 Saturation: 31.9 %
Patient temperature: 37
pCO2, Ven: 58 mmHg (ref 44–60)
pH, Ven: 7.44 — ABNORMAL HIGH (ref 7.25–7.43)

## 2023-11-26 LAB — GLUCOSE, CAPILLARY: Glucose-Capillary: 81 mg/dL (ref 70–99)

## 2023-11-26 MED ORDER — LIDOCAINE 5 % EX PTCH
1.0000 | MEDICATED_PATCH | CUTANEOUS | Status: DC
  Administered 2023-11-26 – 2023-12-07 (×11): 1 via TRANSDERMAL
  Filled 2023-11-26 (×13): qty 1

## 2023-11-26 NOTE — TOC Progression Note (Signed)
 Transition of Care [facility]) - Progression Note    Patient Details  Name: Steven Elliott MRN: [id number] Date of Birth: 05/06/1930  Transition of Care [facility]) CM/SW Contact  Truddie Hidden, RN Phone Number: 11/26/2023, 12:43 PM  Clinical Narrative:    Attempt to reach [name], patient's daughter regarding bed offer. No answer. Left a message.         Expected Discharge Plan and Services                                               Social Determinants of Health (SDOH) Interventions SDOH Screenings   Food Insecurity: Patient Declined (11/17/2023)  Housing: Patient Declined (11/17/2023)  Transportation Needs: Patient Declined (11/17/2023)  Utilities: Patient Declined (11/17/2023)  Social Connections: Unknown (11/17/2023)  Tobacco Use: Medium Risk (11/17/2023)    Readmission Risk Interventions    09/09/2023    1:41 PM  Readmission Risk Prevention Plan  Transportation Screening Complete  PCP or Specialist Appt within 3-5 Days Complete  HRI or Home Care Consult Complete  Social Work Consult for Recovery Care Planning/Counseling Complete  Palliative Care Screening Complete  Medication Review Oceanographer) Complete

## 2023-11-26 NOTE — Progress Notes (Signed)
       CROSS COVER NOTE  NAME: Steven Elliott MRN: [id number] DOB : 1930/05/31 ATTENDING PHYSICIAN: Pennie Banter, DO    Date of Service   11/26/2023   HPI/Events of Note   Nurse reports daughter has concerns regarding patient's drowsiness.  Review of chart shows this is not a new over night finding. Patient admitted with hypoxic resp failure from PNA from flu with  underlying emphysema. He aslo has complicating history of dementia, CKD, HTN, cerebral aneurysm, prostate cancer with mets to bone and mildly exacerbated heart failure.  Thorough workup for resp failure and possible BIPAP / CPAP need at discharge has been completed and determined ventilation status has not required Prior VBG concerning for resp alkalosis.  Contributing causes can be loop diuresis, and hypothyroidism amongst others. TSH in 10/2023 was normal  Interventions   Assessment/Plan: Stat cbg check = 81 VBG - plan to hold diuresis with loop and give diamox if degree of alkalosis warrants use        Donnie Mesa NP [facility] Cross Cover 7pm-7am - check amion for availability Pager [phone]

## 2023-11-26 NOTE — Progress Notes (Signed)
 PROGRESS NOTE       Steven Elliott   [id number] DOB: [birth year] DOA: 09/08/2023 [facility]: [facility]    156A/156A-AA  LOS: 17 days    Brief hospital course:   "Steven Elliott is a 89 y.o. male with medical history significant of dementia, chronic kidney disease, COPD, hyponatremia prostate cancer presenting with acute respiratory failure with hypoxia, COPD exacerbation, SIRS, weakness.  History primarily from patient's daughter in the setting of decompensated respiratory failure.  Patient noted to have been found shortness of breath with generalized rhonchi at home by EMS.  Patient noted to be sick x 3 days with URI symptoms.  Positive sick contact with similar symptoms pending evaluation today.  No fevers or chills.  Noted recent admission December 18 through January 6 for issues including closed wedge compression fracture of the T11 vertebrae as well as aspiration pneumonia.  Patient otherwise stable inpatient rehab course per the daughter.  No reported falls no worsening weakness.  No abdominal pain.  No reported nausea or vomiting.  Symptoms had sudden onset.  No reported diarrhea.  Per the daughter, patient does not know how to properly use inhalers associate with COPD at home. "   Assessment & Plan: Admitted with influenza and COPD exacerbation  Influenza A S/p 5 day course tamiflu  Acute hypoxic respiratory failure Rapid response 2/27  morning for dyspnea, improved on bipap now weaned to 2-3 liters. Again required bipap for respiratory distress morning of 3/2. Now stable. CTA 3/3 no PE or other complications but severe baseline emphysema. No baseline hypercarbia to qualify for bipap. Discuss with pulm today, given no osa diagnosis also won't qualify for cpap at discharge. - continue weaning oxygen as tolerated - target sats > 90%  Hypokalemia - recurrent K 3.4 --replacement Kcl ordered --Monitor BMP & replace K as needed  Copd with exacerbation 2/2 flu.  3/6 -  no wheezing on exam - methylpred>prednisone, weaned to 30 mg on 3/5 - continue weaning prednisone - duonebs standing   Baseline dementia At baseline currently - home memantine  HFmrEF with mild exacerbation Volume up clinically, bnp elevated.  Ef 40-45 on tte, this is a new problem, w/ regional wall motion abnormalities though no chest pain and only minimal trop elevation. Have reviewed w/ dr. Graciela Husbands of cardiology, he advises no additional w/u for the time being - will transition to oral lasix today 40 daily   T11-12 fractures status post fall  -underlying ankylosing spondylitis Recent hospitalization for this, s/p rehab stay - PT advising snf, toc consulted   Anemia of chronic disease Stable H&H, monitor   Essential (primary) hypertension BP low normal, not on meds at home - monitor   Cerebral aneurysm, nonruptured CT head recent hospitalization shows stable aneurysm   Prostate cancer metastatic to bone (HCC) --cont home Nubeqa      DVT prophylaxis: Lovenox SQ  Code Status: DNR/DNI  Family Communication: daughter updated at bedside on rounds today 3/6  Level of care: Med-Surg  Dispo:   The patient is from: home Anticipated d/c is to: SNF -  TOC consulted for placement     Subjective and Interval History:  Pt seen with daughter at bedside this AM.  Pt is awake but repeatedly falls back to sleep.  Daughter repeatedly shakes pt to wake him up, telling him he cannot sleep all day. Pt does endorse back pain. No other complaints.       General exam: awake, very drowsy, difficulty staying awake,  no acute distress HEENT: moist mucus membranes, hearing grossly normal  Respiratory system: CTAB, no wheezes, rales or rhonchi, normal respiratory effort. Cardiovascular system: normal S1/S2, RRR, no pedal edema.   Gastrointestinal system: soft, NT, ND, no HSM felt, +bowel sounds. Central nervous system: A&O x self at least. 5/5 symmetric grip strength & moves all extremities  on command, no gross focal neurologic deficits, normal speech Skin: dry, intact, normal temperature Psychiatry: normal mood, congruent affect, abnormal judgement and insight due to dementia      Data Reviewed: I have personally reviewed labs and imaging studies  VBG this AM 7.44 / 58 pCO2 - ruled out hypercarbia    Notable labs 3/6 -- K 3.4, Ca 8.2, Cl 97      Esaw Grandchild, DO Internal Medicine [facility] If 7PM-7AM, please contact night-coverage 09/26/2023, 3:53 PM

## 2023-11-26 NOTE — Plan of Care (Signed)

## 2023-11-26 NOTE — Plan of Care (Signed)

## 2023-11-27 ENCOUNTER — Inpatient Hospital Stay

## 2023-11-27 DIAGNOSIS — J441 Chronic obstructive pulmonary disease with (acute) exacerbation: Secondary | ICD-10-CM | POA: Diagnosis not present

## 2023-11-27 LAB — BLOOD GAS, VENOUS
Acid-Base Excess: 11.8 mmol/L — ABNORMAL HIGH (ref 0.0–2.0)
Bicarbonate: 36.7 mmol/L — ABNORMAL HIGH (ref 20.0–28.0)
O2 Saturation: 34.9 %
Patient temperature: 37
pCO2, Ven: 47 mmHg (ref 44–60)
pH, Ven: 7.5 — ABNORMAL HIGH (ref 7.25–7.43)

## 2023-11-27 LAB — BASIC METABOLIC PANEL WITH GFR
Anion gap: 13 (ref 5–15)
BUN: 19 mg/dL (ref 8–23)
CO2: 26 mmol/L (ref 22–32)
Calcium: 7.9 mg/dL — ABNORMAL LOW (ref 8.9–10.3)
Chloride: 98 mmol/L (ref 98–111)
Creatinine, Ser: 0.74 mg/dL (ref 0.61–1.24)
GFR, Estimated: >60 mL/min
Glucose, Bld: 90 mg/dL (ref 70–99)
Potassium: 3.9 mmol/L (ref 3.5–5.1)
Sodium: 137 mmol/L (ref 135–145)

## 2023-11-27 LAB — CBC
HCT: 34.3 % — ABNORMAL LOW (ref 39.0–52.0)
Hemoglobin: 11.4 g/dL — ABNORMAL LOW (ref 13.0–17.0)
MCH: 33.8 pg (ref 26.0–34.0)
MCHC: 33.2 g/dL (ref 30.0–36.0)
MCV: 101.8 fL — ABNORMAL HIGH (ref 80.0–100.0)
Platelets: 232 K/uL (ref 150–400)
RBC: 3.37 MIL/uL — ABNORMAL LOW (ref 4.22–5.81)
RDW: 18.2 % — ABNORMAL HIGH (ref 11.5–15.5)
WBC: 7.5 K/uL (ref 4.0–10.5)
nRBC: 0 % (ref 0.0–0.2)

## 2023-11-27 MED ORDER — DM-GUAIFENESIN ER 30-600 MG PO TB12
1.0000 | ORAL_TABLET | Freq: Two times a day (BID) | ORAL | Status: DC
  Administered 2023-11-27 – 2023-12-08 (×20): 1 via ORAL
  Filled 2023-11-27 (×20): qty 1

## 2023-11-27 MED ORDER — ALUM & MAG HYDROXIDE-SIMETH 200-200-20 MG/5ML PO SUSP
15.0000 mL | Freq: Four times a day (QID) | ORAL | Status: DC | PRN
  Administered 2023-11-27: 15 mL via ORAL
  Filled 2023-11-27: qty 30

## 2023-11-27 NOTE — Plan of Care (Signed)
  Problem: Education: Goal: Knowledge of General Education information will improve Description: Including pain rating scale, medication(s)/side effects and non-pharmacologic comfort measures Outcome: Progressing   Problem: Health Behavior/Discharge Planning: Goal: Ability to manage health-related needs will improve Outcome: Progressing   Problem: Clinical Measurements: Goal: Ability to maintain clinical measurements within normal limits will improve Outcome: Progressing Goal: Will remain free from infection Outcome: Progressing Goal: Diagnostic test results will improve Outcome: Progressing Goal: Respiratory complications will improve Outcome: Progressing Goal: Cardiovascular complication will be avoided Outcome: Progressing   Problem: Activity: Goal: Risk for activity intolerance will decrease Outcome: Progressing   Problem: Coping: Goal: Level of anxiety will decrease Outcome: Progressing   Problem: Elimination: Goal: Will not experience complications related to urinary retention Outcome: Progressing   Problem: Pain Managment: Goal: General experience of comfort will improve and/or be controlled Outcome: Progressing   Problem: Safety: Goal: Ability to remain free from injury will improve Outcome: Progressing   Problem: Skin Integrity: Goal: Risk for impaired skin integrity will decrease Outcome: Progressing   Problem: Activity: Goal: Ability to tolerate increased activity will improve Outcome: Progressing Goal: Will verbalize the importance of balancing activity with adequate rest periods Outcome: Progressing   Problem: Respiratory: Goal: Ability to maintain a clear airway will improve Outcome: Progressing Goal: Levels of oxygenation will improve Outcome: Progressing Goal: Ability to maintain adequate ventilation will improve Outcome: Progressing

## 2023-11-27 NOTE — Progress Notes (Signed)
 PROGRESS NOTE       Steven Elliott   [id number] DOB: 1930-03-19 DOA: 09/08/2023 [facility]: [facility]    156A/156A-AA  LOS: 17 days    Brief hospital course:   "Steven Elliott is a 89 y.o. male with medical history significant of dementia, chronic kidney disease, COPD, hyponatremia prostate cancer presenting with acute respiratory failure with hypoxia, COPD exacerbation, SIRS, weakness.  History primarily from patient's daughter in the setting of decompensated respiratory failure.  Patient noted to have been found shortness of breath with generalized rhonchi at home by EMS.  Patient noted to be sick x 3 days with URI symptoms.  Positive sick contact with similar symptoms pending evaluation today.  No fevers or chills.  Noted recent admission December 18 through January 6 for issues including closed wedge compression fracture of the T11 vertebrae as well as aspiration pneumonia.  Patient otherwise stable inpatient rehab course per the daughter.  No reported falls no worsening weakness.  No abdominal pain.  No reported nausea or vomiting.  Symptoms had sudden onset.  No reported diarrhea.  Per the daughter, patient does not know how to properly use inhalers associate with COPD at home. "   Assessment & Plan: Admitted with influenza and COPD exacerbation  Influenza A S/p 5 day course tamiflu  Acute hypoxic respiratory failure Rapid response 2/27  morning for dyspnea, improved on bipap now weaned to 2-3 liters. Again required bipap for respiratory distress morning of 3/2. Now stable. CTA 3/3 no PE or other complications but severe baseline emphysema. No baseline hypercarbia to qualify for bipap. Discuss with pulm today, given no osa diagnosis also won't qualify for cpap at discharge. - continue weaning oxygen as tolerated - target sats > 90%  Hypokalemia - recurrent K 3.4 --replacement Kcl ordered --Monitor BMP & replace K as needed  Copd with exacerbation 2/2 flu.  3/6 -  no wheezing on exam - methylpred>prednisone, weaned to 30 mg on 3/5 - continue weaning prednisone - duonebs standing   Baseline dementia At baseline currently - home memantine  HFmrEF with mild exacerbation Volume up clinically, bnp elevated.  Ef 40-45 on tte, this is a new problem, w/ regional wall motion abnormalities though no chest pain and only minimal trop elevation. Have reviewed w/ dr. Graciela Husbands of cardiology, he advises no additional w/u for the time being - will transition to oral lasix today 40 daily   T11-12 fractures status post fall  -underlying ankylosing spondylitis Recent hospitalization for this, s/p rehab stay - PT advising snf, toc consulted   Anemia of chronic disease Stable H&H, monitor   Essential (primary) hypertension BP low normal, not on meds at home - monitor   Cerebral aneurysm, nonruptured CT head recent hospitalization shows stable aneurysm   Prostate cancer metastatic to bone (HCC) --cont home Nubeqa      DVT prophylaxis: Lovenox SQ  Code Status: DNR/DNI  Family Communication: daughter updated at bedside on rounds daily this week  Level of care: Med-Surg  Dispo:   The patient is from: home Anticipated d/c is to: SNF -  TOC consulted for placement     Subjective and Interval History:  Pt seen with daughter at bedside this AM.  Pt is lethargic again today but does briefly respond.  Non-verbal and does not open eyes however.  Shakes head no when asked if in pain.       General exam: awake, very drowsy, difficulty staying awake, no acute distress HEENT: moist mucus  membranes, hearing grossly normal  Respiratory system: CTAB, no wheezes, rales or rhonchi, normal respiratory effort. Cardiovascular system: normal S1/S2, RRR, no pedal edema.   Gastrointestinal system: soft, NT, ND, no HSM felt, +bowel sounds. Central nervous system: A&O x self at least. 5/5 symmetric grip strength & moves all extremities on command, no gross focal neurologic  deficits, normal speech Skin: dry, intact, normal temperature Psychiatry: normal mood, congruent affect, abnormal judgement and insight due to dementia      Data Reviewed: I have personally reviewed labs and imaging studies    3/7 -- VBG  .44 / 58 pCO2 - ruled out hypercarbia    Notable labs today Normal BMP except Ca 7.9 Hbg stable 11.4 No leukocytosis      Esaw Grandchild, DO Internal Medicine [facility] If 7PM-7AM, please contact night-coverage 09/26/2023, 3:53 PM

## 2023-11-27 NOTE — Progress Notes (Signed)
 PT Cancellation Note  Patient Details Name: Steven Elliott MRN: [id number] DOB: 04/14/1930   Cancelled Treatment:     Attempted to see pt who was soiled after attempting to use bed pan, will re-attempt next available date/time per POC.   Jannet Askew 11/27/2023, 3:52 PM

## 2023-11-28 ENCOUNTER — Inpatient Hospital Stay

## 2023-11-28 DIAGNOSIS — J441 Chronic obstructive pulmonary disease with (acute) exacerbation: Secondary | ICD-10-CM | POA: Diagnosis not present

## 2023-11-28 LAB — BASIC METABOLIC PANEL
Anion gap: 9 (ref 5–15)
BUN: 21 mg/dL (ref 8–23)
CO2: 29 mmol/L (ref 22–32)
Calcium: 8 mg/dL — ABNORMAL LOW (ref 8.9–10.3)
Chloride: 99 mmol/L (ref 98–111)
Creatinine, Ser: 0.71 mg/dL (ref 0.61–1.24)
GFR, Estimated: >60 mL/min (ref >60)
Glucose, Bld: 87 mg/dL (ref 70–99)
Potassium: 3.8 mmol/L (ref 3.5–5.1)
Sodium: 137 mmol/L (ref 135–145)

## 2023-11-28 LAB — BRAIN NATRIURETIC PEPTIDE: B Natriuretic Peptide: 147.6 pg/mL — ABNORMAL HIGH (ref 0.0–100.0)

## 2023-11-28 NOTE — Plan of Care (Signed)
  Problem: Health Behavior/Discharge Planning: Goal: Ability to manage health-related needs will improve Outcome: Progressing   Problem: Clinical Measurements: Goal: Ability to maintain clinical measurements within normal limits will improve Outcome: Progressing   Problem: Clinical Measurements: Goal: Respiratory complications will improve Outcome: Progressing   Problem: Clinical Measurements: Goal: Cardiovascular complication will be avoided Outcome: Progressing   Problem: Activity: Goal: Risk for activity intolerance will decrease Outcome: Progressing

## 2023-11-28 NOTE — Progress Notes (Signed)
 PROGRESS NOTE       Steven Elliott   [id number] DOB: 1929/11/13 DOA: 09/08/2023 [facility]: [facility]    156A/156A-AA  LOS: 17 days    Brief hospital course:   "Steven Elliott is a 89 y.o. male with medical history significant of dementia, chronic kidney disease, COPD, hyponatremia prostate cancer presenting with acute respiratory failure with hypoxia, COPD exacerbation, SIRS, weakness.  History primarily from patient's daughter in the setting of decompensated respiratory failure.  Patient noted to have been found shortness of breath with generalized rhonchi at home by EMS.  Patient noted to be sick x 3 days with URI symptoms.  Positive sick contact with similar symptoms pending evaluation today.  No fevers or chills.  Noted recent admission December 18 through January 6 for issues including closed wedge compression fracture of the T11 vertebrae as well as aspiration pneumonia.  Patient otherwise stable inpatient rehab course per the daughter.  No reported falls no worsening weakness.  No abdominal pain.  No reported nausea or vomiting.  Symptoms had sudden onset.  No reported diarrhea.  Per the daughter, patient does not know how to properly use inhalers associate with COPD at home. "   Assessment & Plan: Admitted with influenza and COPD exacerbation  Influenza A S/p 5 day course tamiflu  Acute hypoxic respiratory failure Rapid response 2/27  morning for dyspnea, improved on bipap now weaned to 2-3 liters. Again required bipap for respiratory distress morning of 3/2. Now stable. CTA 3/3 no PE or other complications but severe baseline emphysema. No baseline hypercarbia to qualify for bipap. Discuss with pulm today, given no osa diagnosis also won't qualify for cpap at discharge. - continue weaning oxygen as tolerated - target sats > 90%  Hypokalemia - resolved with replacement --Monitor BMP & replace K as needed  Copd with exacerbation 2/2 flu.  3/6>>9 - no wheezing  on exam - methylpred>prednisone, weaned to 30 mg on 3/5 - continue weaning prednisone - duonebs standing   Baseline dementia At baseline currently - home memantine  HFmrEF with mild exacerbation Volume up clinically, bnp elevated.  Ef 40-45 on tte, this is a new problem, w/ regional wall motion abnormalities though no chest pain and only minimal trop elevation. Have reviewed w/ dr. Graciela Husbands of cardiology, he advises no additional w/u for the time being - will transition to oral lasix today 40 daily   T11-12 fractures status post fall  -underlying ankylosing spondylitis Recent hospitalization for this, s/p rehab stay - PT advising snf, toc consulted   Anemia of chronic disease Stable H&H, monitor   Essential (primary) hypertension BP low normal, not on meds at home - monitor   Cerebral aneurysm, nonruptured CT head recent hospitalization shows stable aneurysm   Prostate cancer metastatic to bone (HCC) --cont home Nubeqa      DVT prophylaxis: Lovenox SQ  Code Status: DNR/DNI  Family Communication: daughter updated at bedside on rounds daily this week   Dispo:   The patient is from: home Anticipated d/c is to: SNF -  TOC consulted for placement     Subjective and Interval History:  Pt seen with daughter at bedside this AM.  He is awake and alert, RN giving meds in applesauce.  Pt nods yes having back pain.  Does not feel short of breath.  Very weak voice, not able to project to hear his attempts to speak.       General exam: awake, alert, no acute distress Respiratory system:  coarse rhonchi vs referred upper airway secretion sounds, no wheezes, good aeration, normal respiratory effort. On 4 L/min  O2 Cardiovascular system: normal S1/S2, RRR, no pedal edema.   Gastrointestinal system: soft, NT, ND Central nervous system: A&O x self at least. no gross focal neurologic deficits, normal speech but very weak vocal projection Skin: dry, intact, normal  temperature Psychiatry: normal mood, congruent affect, abnormal judgement and insight due to dementia      Data Reviewed: I have personally reviewed labs and imaging studies  Notable labs today Normal BMP except Ca 8.0 BNP 147.6   Last Hbg 3/8 stable 11.4, No leukocytosis      Steven Grandchild, DO Internal Medicine [facility] If 7PM-7AM, please contact night-coverage 09/26/2023, 3:53 PM

## 2023-11-29 LAB — BASIC METABOLIC PANEL
Anion gap: 11 (ref 5–15)
BUN: 26 mg/dL — ABNORMAL HIGH (ref 8–23)
CO2: 29 mmol/L (ref 22–32)
Calcium: 8.2 mg/dL — ABNORMAL LOW (ref 8.9–10.3)
Chloride: 99 mmol/L (ref 98–111)
Creatinine, Ser: 0.88 mg/dL (ref 0.61–1.24)
GFR, Estimated: >60 mL/min (ref >60)
Glucose, Bld: 100 mg/dL — ABNORMAL HIGH (ref 70–99)
Potassium: 3.6 mmol/L (ref 3.5–5.1)
Sodium: 139 mmol/L (ref 135–145)

## 2023-11-29 MED ORDER — GUAIFENESIN-DM 100-10 MG/5ML PO SYRP
5.0000 mL | ORAL_SOLUTION | ORAL | Status: DC | PRN
  Administered 2023-11-30: 5 mL via ORAL
  Filled 2023-11-29: qty 10

## 2023-11-29 MED ORDER — PREDNISONE 20 MG PO TABS
20.0000 mg | ORAL_TABLET | Freq: Every day | ORAL | Status: DC
  Administered 2023-11-30: 20 mg via ORAL
  Filled 2023-11-29: qty 1

## 2023-11-29 NOTE — Plan of Care (Signed)

## 2023-11-29 NOTE — TOC Progression Note (Addendum)
 Transition of Care [facility]) - Progression Note    Patient Details  Name: Steven Elliott MRN: [id number] Date of Birth: 06-Aug-1930  Transition [facility] CM/SW Contact  Truddie Hidden, RN Phone Number: 11/29/2023, 12:55 PM  Clinical Narrative:    Attempt to reach patient's daughter to give bed offer. No answer. Left a message.  01:40pm Patient daughter, Steven Elliott bed offer for [facility]. She stated if facility is a one star she would likely appeal. She was advised of CMS guideline regarding dishcarging to first available bed at discharge, and right to appeal. [name] was also advised [facility] was considering but $1466 co-payment would be expected prior to admission.          Expected Discharge Plan and Services                                               Social Determinants of Health (SDOH) Interventions SDOH Screenings   Food Insecurity: Patient Declined (11/17/2023)  Housing: Patient Declined (11/17/2023)  Transportation Needs: Patient Declined (11/17/2023)  Utilities: Patient Declined (11/17/2023)  Social Connections: Unknown (11/17/2023)  Tobacco Use: Medium Risk (11/17/2023)    Readmission Risk Interventions    09/09/2023    1:41 PM  Readmission Risk Prevention Plan  Transportation Screening Complete  PCP or Specialist Appt within 3-5 Days Complete  HRI or Home Care Consult Complete  Social Work Consult for Recovery Care Planning/Counseling Complete  Palliative Care Screening Complete  Medication Review [employer]) Complete

## 2023-11-29 NOTE — Plan of Care (Signed)
  Problem: Education: Goal: Knowledge of General Education information will improve Description: Including pain rating scale, medication(s)/side effects and non-pharmacologic comfort measures 11/29/2023 0449 by Martha Clan, RN Outcome: Progressing 11/29/2023 0449 by Martha Clan, RN Outcome: Progressing   Problem: Health Behavior/Discharge Planning: Goal: Ability to manage health-related needs will improve Outcome: Progressing   Problem: Clinical Measurements: Goal: Ability to maintain clinical measurements within normal limits will improve Outcome: Progressing Goal: Will remain free from infection Outcome: Progressing Goal: Diagnostic test results will improve Outcome: Progressing Goal: Respiratory complications will improve Outcome: Progressing Goal: Cardiovascular complication will be avoided Outcome: Progressing   Problem: Activity: Goal: Risk for activity intolerance will decrease Outcome: Progressing   Problem: Nutrition: Goal: Adequate nutrition will be maintained Outcome: Progressing   Problem: Coping: Goal: Level of anxiety will decrease Outcome: Progressing   Problem: Elimination: Goal: Will not experience complications related to bowel motility Outcome: Progressing Goal: Will not experience complications related to urinary retention Outcome: Progressing   Problem: Pain Managment: Goal: General experience of comfort will improve and/or be controlled Outcome: Progressing   Problem: Safety: Goal: Ability to remain free from injury will improve Outcome: Progressing   Problem: Skin Integrity: Goal: Risk for impaired skin integrity will decrease Outcome: Progressing   Problem: Education: Goal: Knowledge of disease or condition will improve Outcome: Progressing Goal: Knowledge of the prescribed therapeutic regimen will improve Outcome: Progressing Goal: Individualized Educational Video(s) Outcome: Progressing   Problem: Activity: Goal:  Ability to tolerate increased activity will improve Outcome: Progressing Goal: Will verbalize the importance of balancing activity with adequate rest periods Outcome: Progressing   Problem: Respiratory: Goal: Ability to maintain a clear airway will improve Outcome: Progressing Goal: Levels of oxygenation will improve Outcome: Progressing Goal: Ability to maintain adequate ventilation will improve Outcome: Progressing

## 2023-11-29 NOTE — Progress Notes (Signed)
 PROGRESS NOTE       REINHARDT LICAUSI   ZOX:[id number] DOB: 10/15/1929 DOA: 09/08/2023 [facility]: [facility]    156A/156A-AA  LOS: 17 days    Brief hospital course:   "Steven Elliott is a 89 y.o. male with medical history significant of dementia, chronic kidney disease, COPD, hyponatremia prostate cancer presenting with acute respiratory failure with hypoxia, COPD exacerbation, SIRS, weakness.  History primarily from patient's daughter in the setting of decompensated respiratory failure.  Patient noted to have been found shortness of breath with generalized rhonchi at home by EMS.  Patient noted to be sick x 3 days with URI symptoms.  Positive sick contact with similar symptoms pending evaluation today.  No fevers or chills.  Noted recent admission December 18 through January 6 for issues including closed wedge compression fracture of the T11 vertebrae as well as aspiration pneumonia.  Patient otherwise stable inpatient rehab course per the daughter.  No reported falls no worsening weakness.  No abdominal pain.  No reported nausea or vomiting.  Symptoms had sudden onset.  No reported diarrhea.  Per the daughter, patient does not know how to properly use inhalers associate with COPD at home. "   Assessment & Plan: Admitted with influenza and COPD exacerbation  Influenza A S/p 5 day course tamiflu  Acute hypoxic respiratory failure Rapid response 2/27  morning for dyspnea, improved on bipap now weaned to 2-3 liters. Again required bipap for respiratory distress morning of 3/2. Now stable. CTA 3/3 no PE or other complications but severe baseline emphysema. No baseline hypercarbia to qualify for bipap. Discuss with pulm today, given no osa diagnosis also won't qualify for cpap at discharge. - continue weaning oxygen as tolerated - target sats > 90%  Hypokalemia - resolved with replacement --Monitor BMP & replace K as needed  Copd with exacerbation 2/2 flu.  3/6>>9 - no wheezing  on exam - methylpred>prednisone -- weaning prednisone 30 >> 20 mg & continue taper - continue weaning prednisone - duonebs standing   Baseline dementia At baseline currently - home memantine  HFmrEF with mild exacerbation Volume up clinically, bnp elevated.  Ef 40-45 on tte, this is a new problem, w/ regional wall motion abnormalities though no chest pain and only minimal trop elevation. Have reviewed w/ dr. Graciela Husbands of cardiology, he advises no additional w/u for the time being - will transition to oral lasix today 40 daily   T11-12 fractures status post fall  -underlying ankylosing spondylitis Recent hospitalization for this, s/p rehab stay - PT advising snf, toc consulted   Anemia of chronic disease Stable H&H, monitor   Essential (primary) hypertension BP low normal, not on meds at home - monitor   Cerebral aneurysm, nonruptured CT head recent hospitalization shows stable aneurysm   Prostate cancer metastatic to bone (HCC) --cont home Nubeqa      DVT prophylaxis: Lovenox SQ  Code Status: DNR/DNI  Family Communication: daughter updated at bedside on rounds daily this week   Dispo:   The patient is from: home Anticipated d/c is to: SNF -  TOC consulted for placement  MEDICALLY STABLE FOR DISCHARGE       Subjective and Interval History:  Pt seen with daughter at bedside this AM.  He is awake and alert. Voice sounds much stronger today.  He denies feeling sick, says "I feel about like normal".  Does report back pain ongoing.  No other complaints.        General exam: awake, alert,  no acute distress Respiratory system: CTAB without wheezes or rhonchi, normal respiratory effort. On 4 L/min [place] O2 Cardiovascular system: normal S1/S2, RRR, no pedal edema.   Gastrointestinal system: soft, NT, ND Central nervous system: A&O x self at least. no gross focal neurologic deficits, normal speech with stronger vocal projection today Skin: dry, intact, normal  temperature Psychiatry: normal mood, congruent affect, abnormal judgement and insight due to dementia      Data Reviewed: I have personally reviewed labs and imaging studies  Notable labs today Normal BMP except glucose 100, Ca 8.2    3/9 BNP 147.6  Last Hbg 3/8 stable 11.4, No leukocytosis      Esaw Grandchild, DO Internal Medicine [facility] If 7PM-7AM, please contact night-coverage 09/26/2023, 3:53 PM

## 2023-11-30 MED ORDER — PREDNISONE 10 MG PO TABS
10.0000 mg | ORAL_TABLET | Freq: Every day | ORAL | Status: DC
  Administered 2023-12-01 – 2023-12-03 (×3): 10 mg via ORAL
  Filled 2023-11-30 (×3): qty 1

## 2023-11-30 NOTE — Plan of Care (Signed)
  Problem: Pain Managment: Goal: General experience of comfort will improve and/or be controlled Outcome: Progressing   Problem: Safety: Goal: Ability to remain free from injury will improve Outcome: Progressing   Problem: Skin Integrity: Goal: Risk for impaired skin integrity will decrease Outcome: Progressing

## 2023-11-30 NOTE — Progress Notes (Signed)
 PT Cancellation Note  Patient Details Name: REGIS HINTON MRN: [id number] DOB: 1930/09/03   Cancelled Treatment:     Therapist in to see pt who was resting in bed, daughter at bedside. Pt remains very weak with minimal po intake. Unable to tolerate mobility due to back pain. Discussed multiple comorbidities with daughter and concerns of causing more discomfort to pt during PT session. Daughter stated that she didn't think that he would even be able to tolerate STR at this point. Secure chat with Dr. Denton Lank, plan to contact Palliative Nurse for further assist.    Jannet Askew 11/30/2023, 12:48 PM

## 2023-11-30 NOTE — Progress Notes (Signed)
 PROGRESS NOTE       Steven Elliott   ZOX:[id number] DOB: 1930-08-21 DOA: 09/08/2023 [facility]CP: [facility]    156A/156A-AA  LOS: 17 days    Brief hospital course:   "Steven Elliott is a 89 y.o. male with medical history significant of dementia, chronic kidney disease, COPD, hyponatremia prostate cancer presenting with acute respiratory failure with hypoxia, COPD exacerbation, SIRS, weakness.  History primarily from patient's daughter in the setting of decompensated respiratory failure.  Patient noted to have been found shortness of breath with generalized rhonchi at home by EMS.  Patient noted to be sick x 3 days with URI symptoms.  Positive sick contact with similar symptoms pending evaluation today.  No fevers or chills.  Noted recent admission December 18 through January 6 for issues including closed wedge compression fracture of the T11 vertebrae as well as aspiration pneumonia.  Patient otherwise stable inpatient rehab course per the daughter.  No reported falls no worsening weakness.  No abdominal pain.  No reported nausea or vomiting.  Symptoms had sudden onset.  No reported diarrhea.  Per the daughter, patient does not know how to properly use inhalers associate with COPD at home. "  Admitted with influenza and COPD exacerbation Further hospital course and management as outlined below.  I assumed care on 3/5.  3/5 >> 3/11 -- Pt has been weaned down to 2-3 L/min oxygen, stable to downgrade to med/surg.  Unable to participate to any degree with PT and OT.  Requested Palliative care to re-visit for goals of care with family, as it does not appear that pt is capable to participate in rehab.      Assessment & Plan:  Influenza A S/p 5 day course tamiflu  Acute hypoxic respiratory failure Rapid response 2/27  morning for dyspnea, improved on bipap now weaned to 2-3 liters. Again required bipap for respiratory distress morning of 3/2. Now stable. CTA 3/3 no PE or other  complications but severe baseline emphysema. No baseline hypercarbia to qualify for bipap. Discuss with pulm today, given no osa diagnosis also won't qualify for cpap at discharge. - continue weaning oxygen as tolerated - target sats > 90%  Hypokalemia - resolved with replacement --Monitor BMP & replace K as needed  Copd with exacerbation 2/2 flu.  3/6>>9 - no wheezing on exam - methylpred>prednisone  - slowly prednisone given his prolonged course 20  >> 10  and stop  - duonebs    Baseline dementia At baseline currently - home memantine - delirium precaution - palliative care for GOC - hospice appropriate, unable to progress with therapy  HFmrEF with mild exacerbation Volume up clinically, bnp elevated.  Ef 40-45 on tte, this is a new problem, w/ regional wall motion abnormalities though no chest pain and only minimal trop elevation. Have reviewed w/ dr. Graciela Husbands of cardiology, he advises no additional w/u for the time being - will transition to oral lasix today 40 daily   T11-12 fractures status post fall  -underlying ankylosing spondylitis Recent hospitalization for this, s/p rehab stay - PT advising snf, toc consulted   Anemia of chronic disease Stable H&H, monitor   Essential (primary) hypertension BP low normal, not on meds at home - monitor   Cerebral aneurysm, nonruptured CT head recent hospitalization shows stable aneurysm   Prostate cancer metastatic to bone (HCC) --cont home Nubeqa      DVT prophylaxis: Lovenox SQ  Code Status: DNR/DNI  Family Communication: daughter updated at bedside on rounds  daily this week   Dispo:   The patient is from: home Anticipated d/c is to: SNF -  TOC consulted for placement  MEDICALLY STABLE FOR DISCHARGE       Subjective and Interval History:  Pt seen with daughter at bedside this AM.  Pt more sleepy again today, was alert yesterday.  He reports back pain, otherwise denies complaints.  Voice is more weak again today  as well.       General exam: awake, alert, no acute distress Respiratory system: CTAB without wheezes or rhonchi, normal respiratory effort. On 2 L/min [place] O2 Cardiovascular system: normal S1/S2, RRR, no pedal edema.   Gastrointestinal system: soft, NT, ND Central nervous system: A&O x self at least. no gross focal neurologic deficits, normal speech with stronger vocal projection today Skin: dry, intact, normal temperature Psychiatry: normal mood, congruent affect, abnormal judgement and insight due to dementia      Data Reviewed: I have personally reviewed labs and imaging studies  No new labs today  Notable labs 3/10 Normal BMP except glucose 100, Ca 8.2  Last Hbg 3/8 stable 11.4, No leukocytosis      Steven Grandchild, DO Internal Medicine [facility] If 7PM-7AM, please contact night-coverage 09/26/2023, 3:53 PM

## 2023-11-30 NOTE — Plan of Care (Signed)
  Problem: Clinical Measurements: Goal: Diagnostic test results will improve Outcome: Progressing   Problem: Activity: Goal: Risk for activity intolerance will decrease Outcome: Progressing   Problem: Nutrition: Goal: Adequate nutrition will be maintained Outcome: Progressing   Problem: Coping: Goal: Level of anxiety will decrease Outcome: Progressing   Problem: Pain Managment: Goal: General experience of comfort will improve and/or be controlled Outcome: Progressing

## 2023-12-01 DIAGNOSIS — J9601 Acute respiratory failure with hypoxia: Secondary | ICD-10-CM | POA: Diagnosis not present

## 2023-12-01 DIAGNOSIS — F0390 Unspecified dementia without behavioral disturbance: Secondary | ICD-10-CM | POA: Diagnosis not present

## 2023-12-01 DIAGNOSIS — C61 Malignant neoplasm of prostate: Secondary | ICD-10-CM | POA: Diagnosis not present

## 2023-12-01 DIAGNOSIS — J441 Chronic obstructive pulmonary disease with (acute) exacerbation: Secondary | ICD-10-CM | POA: Diagnosis not present

## 2023-12-01 MED ORDER — PANTOPRAZOLE SODIUM 40 MG IV SOLR
40.0000 mg | INTRAVENOUS | Status: DC
  Administered 2023-12-01 – 2023-12-02 (×2): 40 mg via INTRAVENOUS
  Filled 2023-12-01 (×2): qty 10

## 2023-12-01 MED ORDER — SODIUM CHLORIDE 3 % IN NEBU
4.0000 mL | INHALATION_SOLUTION | Freq: Two times a day (BID) | RESPIRATORY_TRACT | Status: AC
  Administered 2023-12-01 – 2023-12-04 (×6): 4 mL via RESPIRATORY_TRACT
  Filled 2023-12-01 (×6): qty 4

## 2023-12-01 MED ORDER — ACETAMINOPHEN 10 MG/ML IV SOLN
1000.0000 mg | Freq: Four times a day (QID) | INTRAVENOUS | Status: DC
  Filled 2023-12-01: qty 100

## 2023-12-01 MED ORDER — IPRATROPIUM-ALBUTEROL 0.5-2.5 (3) MG/3ML IN SOLN
3.0000 mL | Freq: Two times a day (BID) | RESPIRATORY_TRACT | Status: DC
  Administered 2023-12-01 – 2023-12-05 (×8): 3 mL via RESPIRATORY_TRACT
  Filled 2023-12-01 (×8): qty 3

## 2023-12-01 MED ORDER — ACETAMINOPHEN 650 MG RE SUPP
650.0000 mg | Freq: Four times a day (QID) | RECTAL | Status: DC | PRN
  Administered 2023-12-04 – 2023-12-05 (×3): 650 mg via RECTAL
  Filled 2023-12-01 (×4): qty 1

## 2023-12-01 NOTE — Plan of Care (Signed)
   Problem: Coping: Goal: Level of anxiety will decrease Outcome: Progressing   Problem: Pain Managment: Goal: General experience of comfort will improve and/or be controlled Outcome: Progressing   Problem: Safety: Goal: Ability to remain free from injury will improve Outcome: Progressing

## 2023-12-01 NOTE — Plan of Care (Signed)
  Problem: Education: Goal: Knowledge of General Education information will improve Description: Including pain rating scale, medication(s)/side effects and non-pharmacologic comfort measures Outcome: Progressing   Problem: Clinical Measurements: Goal: Cardiovascular complication will be avoided Outcome: Progressing   Problem: Coping: Goal: Level of anxiety will decrease Outcome: Progressing   Problem: Pain Managment: Goal: General experience of comfort will improve and/or be controlled Outcome: Progressing   Problem: Safety: Goal: Ability to remain free from injury will improve Outcome: Progressing

## 2023-12-01 NOTE — Progress Notes (Signed)
 PROGRESS NOTE    Steven Elliott  PIR:[id number] DOB: 05-03-1930 DOA: 11/17/2023 [facility]: [facility]  215A/215A-AA  LOS: 14 days   Brief hospital course:   Assessment & Plan:   "Steven Elliott is a 89 y.o. male with medical history significant of dementia, chronic kidney disease, COPD, hyponatremia prostate cancer presenting with acute respiratory failure with hypoxia, COPD exacerbation, SIRS, weakness.  History primarily from patient's daughter in the setting of decompensated respiratory failure.  Patient noted to have been found shortness of breath with generalized rhonchi at home by EMS.  Patient noted to be sick x 3 days with URI symptoms.  Positive sick contact with similar symptoms pending evaluation today.  No fevers or chills.  Noted recent admission December 18 through January 6 for issues including closed wedge compression fracture of the T11 vertebrae as well as aspiration pneumonia.  Patient otherwise stable inpatient rehab course per the daughter.  No reported falls no worsening weakness.  No abdominal pain.  No reported nausea or vomiting.  Symptoms had sudden onset.  No reported diarrhea.  Per the daughter, patient does not know how to properly use inhalers associate with COPD at home. "   Admitted with influenza and COPD exacerbation Further hospital course and management as outlined below.   I assumed care on 3/5.   3/5 >> 3/11 -- Pt has been weaned down to 2-3 L/min oxygen, stable to downgrade to med/surg.  Unable to participate to any degree with PT and OT.  Requested Palliative care to re-visit for goals of care with family, as it does not appear that pt is capable to participate in rehab.       Assessment & Plan:   Influenza A S/p 5 day course tamiflu   Acute hypoxic respiratory failure Rapid response 2/27  morning for dyspnea, improved on bipap. Again required bipap for respiratory distress morning of 3/2. Now stable. CTA 3/3 no PE or other  complications but severe baseline emphysema. No baseline hypercarbia to qualify for bipap. Discuss with pulm today, given no osa diagnosis also won't qualify for cpap at discharge. --Continue supplemental O2 to keep sats >=90%, wean as tolerated   Hypokalemia  --monitor and supplement PRN   Copd with exacerbation 2/2 flu.  - slowly prednisone given his prolonged course 20  >> 10  and stop  --start hypertonic saline neb BID with DuoNeb   Baseline dementia At baseline currently - home memantine - delirium precaution - hospice appropriate, unable to progress with therapy --palliative care provider re-engage with daughters today   HFmrEF with mild exacerbation Volume up clinically, bnp elevated.  Ef 40-45 on tte, this is a new problem, w/ regional wall motion abnormalities though no chest pain and only minimal trop elevation. Have reviewed w/ dr. Graciela Husbands of cardiology, he advises no additional w/u for the time being --cont oral lasix 40 mg daily   T11-12 fractures status post fall  -underlying ankylosing spondylitis Recent hospitalization for this, s/p rehab stay   Anemia of chronic disease Stable H&H, monitor   Essential (primary) hypertension BP low normal, not on meds at home - monitor   Cerebral aneurysm, nonruptured CT head recent hospitalization shows stable aneurysm   Prostate cancer metastatic to bone (HCC) --cont home Nubeqa  Dysphagia --at risk for aspiration.  During previous hospitalization, SLP rec dys 3 with nectar thick diet   DVT prophylaxis: Lovenox SQ Code Status: DNR  Family Communication: daughter updated at bedside today Level of care: Med-Surg  Dispo:   The patient is from: home Anticipated d/c is to: to be determined Anticipated d/c date is: to be determined   Subjective and Interval History:  Pt was lethargic.  Daughter Steven Elliott said pt ate a good breakfast.  Palliative care discussion with daughters today.   Objective: Vitals:   11/30/23  1218 11/30/23 2239 12/01/23 0408 12/01/23 0645  BP: 116/69 114/77 119/71   Pulse: 92 92 93   Resp:  18 18   Temp: 98.7 F (37.1 C) 98 F (36.7 C) 98.2 F (36.8 C)   TempSrc:      SpO2: 96% 97% 92% (!) 88%  Weight:      Height:        Intake/Output Summary (Last 24 hours) at 12/01/2023 1901 Last data filed at 12/01/2023 1826 Gross per 24 hour  Intake 240 ml  Output 800 ml  Net -560 ml   Filed Weights   11/28/23 0500 11/29/23 0600 11/30/23 0500  Weight: 60.8 kg 61.1 kg 61.1 kg    Examination:   Constitutional: NAD, lethargic, opened eyes to voice HEENT: conjunctivae and lids normal, EOMI CV: No cyanosis.   RESP: normal respiratory effort, on 4L   Data Reviewed: I have personally reviewed labs and imaging studies  Time spent: 50 minutes  Darlin Priestly, MD [facility] If 7PM-7AM, please contact night-coverage 12/01/2023, 7:01 PM

## 2023-12-01 NOTE — Progress Notes (Signed)
 Palliative Care Progress Note, Assessment & Plan   Patient Name: Steven Elliott       Date: 12/01/2023 DOB: 1930/02/11  Age: 89 y.o. MRN#: [id number] Attending Physician: Darlin Priestly, MD Primary Care Physician: [facility] Admit Date: 11/17/2023  Subjective: Patient is sitting up in bed.  No apparent distress.  His respirations are even and mildly labored. Secretions are auditory without use of stethoscope.  He does not acknowledge my presence, meet my gaze, or make any vocalizations during my visit.  His daughter [name] is at bedside.  HPI: 89 y.o. male  with past medical history of dementia, CKD, COPD, hyponatremia, prostate cancer admitted on 11/17/2023 with complaints of shortness of breath and generalized weakness.   Patient is being treated for acute respiratory failure with hypoxia, COPD exacerbation, cellulitis, and weakness.   Radiograph of chest revealed COPD with bibasilar atelectasis/infiltrates.  CT of chest revealed no evidence of pulmonary embolism, small bilateral pleural effusions (left greater than right) with associated bilateral lower lobe atelectasis, aortic atherosclerosis, and emphysema.   PMT was consulted to support patient and family with goals of care conversations.  Summary of counseling/coordination of care: Extensive chart review completed prior to meeting patient including labs, vital signs, imaging, progress notes, orders, and available advanced directive documents from current and previous encounters.   After reviewing the patient's chart and assessing the patient at bedside, I spoke with patient in regards to symptom management and goals of care.   [name] shares that she is about to eat her lunch and request that return at a later time.  She also shares she  would like to have her sister on the phone during our discussions.  We made plans for me to return within the hour to speak about goals of care.  I returned to bedside at 1500.  [name] called her sister [name] via speaker phone and we discussed next steps, prognosis, and patient's overall prognosis.  Somes assessed.  Patient is sleeping with no apparent distress.  He has secretions with inhale that are audible without use of stethoscope.  Discussed these are concerning that patient is either aspirating, not swallowing his own secretions, or creating a mucous plug in the back of his throat.  [name] awakened the patient, asked him if he was in pain, and he said yes.  Discussed patient is not safe for p.o. intake at this time.  [name] shares she only wants the patient to receive Tylenol.  IV Tylenol placed and attending made aware of adjustment.  I reviewed that patient has not shown significant improvement, is unable to participate in PT/OT, has had increased cognitive decline, and overall is not improving.    Daughter [name] shares that patient is doing better cognitively, he ate and drink with her this morning.  Daughter [name] shares that patient asked for a sip of water, was able to take it in but held it in his mouth.   I again highlighted that dementia is a chronic, progressive, and irreversible disease that is often exacerbated by acute illnesses and hospitalizations.  [name] shares understanding that dementia can wax and wane.   Counseled with daughters that patient no longer appears to be a candidate  for rehab given his poor functional status and inability to participate in therapies. Therefore, shifting focus from rehab means maintaining patient's current status/treat the treatable or shifting to comfort measures/focusing on QOL.    Discussed potential for returning home with hospice services to follow.  Daughter [name] is resistant to this as she believes this would cancel out other help she is  already arranged in the home.  Hospice philosophy, aging in place, and focusing on quality versus quantity reviewed.  [name] shares she is not prepared to have any of the help at home canceled.    I encouraged her to focus on what her father needs as far as medical treatments to alleviate suffering.  Therapeutic silence, active listening, and emotional support provided.  I highlighted that these discussions are going admit to ensure patient and family are fully aware of all available medical interventions and plans.  Daughters were appreciative of my visit.  No change to plan of care at this time. Physical Exam Vitals reviewed.  Constitutional:      General: He is not in acute distress.    Comments: Thin, frail  Cardiovascular:     Rate and Rhythm: Normal rate.  Pulmonary:     Breath sounds: Examination of the right-lower field reveals decreased breath sounds. Examination of the left-lower field reveals decreased breath sounds. Decreased breath sounds present.  Abdominal:     Palpations: Abdomen is soft.  Musculoskeletal:     Comments: Generalized weakness  Neurological:     Mental Status: He is alert.  Psychiatric:        Mood and Affect: Mood normal. Mood is not anxious.        Behavior: Behavior normal. Behavior is not agitated.             Total Time 50 minutes   Time spent includes: Detailed review of medical records (labs, imaging, vital signs), medically appropriate exam (mental status, respiratory, cardiac, skin), discussed with treatment team, counseling and educating patient, family and staff, documenting clinical information, medication management and coordination of care.  [name] L. Bonita Quin, DNP, FNP-BC Palliative Medicine Team

## 2023-12-01 NOTE — TOC Progression Note (Signed)
 Transition of Care [facility]) - Progression Note    Patient Details  Name: Steven Elliott MRN: [id number] Date of Birth: 1930/01/04  [facility]) CM/SW Contact  Chapman Fitch, RN Phone Number: 12/01/2023, 3:06 PM  Clinical Narrative:     [facility] can offer a bed if 7 days of co pays are paid upfront which are $1466.50  VM left for daughter [name] requesting return call        Expected Discharge Plan and Services                                               Social Determinants of Health (SDOH) Interventions SDOH Screenings   Food Insecurity: Patient Declined (11/17/2023)  Housing: Patient Declined (11/17/2023)  Transportation Needs: Patient Declined (11/17/2023)  Utilities: Patient Declined (11/17/2023)  Social Connections: Unknown (11/17/2023)  Tobacco Use: Medium Risk (11/17/2023)    Readmission Risk Interventions    09/09/2023    1:41 PM  Readmission Risk Prevention Plan  Transportation Screening Complete  PCP or Specialist Appt within 3-5 Days Complete  HRI or Home Care Consult Complete  Social Work Consult for Recovery Care Planning/Counseling Complete  Palliative Care Screening Complete  Medication Review Oceanographer) Complete

## 2023-12-02 DIAGNOSIS — J9601 Acute respiratory failure with hypoxia: Secondary | ICD-10-CM | POA: Diagnosis not present

## 2023-12-02 DIAGNOSIS — E43 Unspecified severe protein-calorie malnutrition: Secondary | ICD-10-CM | POA: Diagnosis not present

## 2023-12-02 DIAGNOSIS — J441 Chronic obstructive pulmonary disease with (acute) exacerbation: Secondary | ICD-10-CM | POA: Diagnosis not present

## 2023-12-02 DIAGNOSIS — S22080D Wedge compression fracture of T11-T12 vertebra, subsequent encounter for fracture with routine healing: Secondary | ICD-10-CM | POA: Diagnosis not present

## 2023-12-02 NOTE — TOC Progression Note (Signed)
 Transition of Care [facility]) - Progression Note    Patient Details  Name: Steven Elliott MRN: [id number] Date of Birth: 26-Mar-1930  Transition [facility] Care [facility]) CM/SW Contact  Hetty Ely, RN Phone Number: 12/02/2023, 9:45 AM  Clinical Narrative:  Attempted to call daughter to follow up on SNF bed offers, no answer left message to return call.         Expected Discharge Plan and Services                                               Social Determinants of Health (SDOH) Interventions SDOH Screenings   Food Insecurity: Patient Declined (11/17/2023)  Housing: Patient Declined (11/17/2023)  Transportation Needs: Patient Declined (11/17/2023)  Utilities: Patient Declined (11/17/2023)  Social Connections: Unknown (11/17/2023)  Tobacco Use: Medium Risk (11/17/2023)    Readmission Risk Interventions    09/09/2023    1:41 PM  Readmission Risk Prevention Plan  Transportation Screening Complete  PCP or Specialist Appt within 3-5 Days Complete  HRI or Home Care Consult Complete  Social Work Consult for Recovery Care Planning/Counseling Complete  Palliative Care Screening Complete  Medication Review Oceanographer) Complete

## 2023-12-02 NOTE — Plan of Care (Signed)
  Problem: Education: Goal: Knowledge of General Education information will improve Description: Including pain rating scale, medication(s)/side effects and non-pharmacologic comfort measures Outcome: Progressing   Problem: Activity: Goal: Risk for activity intolerance will decrease Outcome: Progressing   Problem: Coping: Goal: Level of anxiety will decrease Outcome: Progressing   Problem: Elimination: Goal: Will not experience complications related to bowel motility Outcome: Progressing Goal: Will not experience complications related to urinary retention Outcome: Progressing   Problem: Pain Managment: Goal: General experience of comfort will improve and/or be controlled Outcome: Progressing

## 2023-12-02 NOTE — Progress Notes (Signed)
 PROGRESS NOTE    SHA AMER  ZOX:[id number] DOB: 1930/07/04 DOA: 11/17/2023 [facility]CP: [facility]  215A/215A-AA  LOS: 15 days   Brief hospital course:   Assessment & Plan:   "Steven Elliott is a 89 y.o. male with medical history significant of dementia, chronic kidney disease, COPD, hyponatremia prostate cancer presenting with acute respiratory failure with hypoxia, COPD exacerbation, SIRS, weakness.  History primarily from patient's daughter in the setting of decompensated respiratory failure.  Patient noted to have been found shortness of breath with generalized rhonchi at home by EMS.  Patient noted to be sick x 3 days with URI symptoms.  Positive sick contact with similar symptoms pending evaluation today.  No fevers or chills.  Noted recent admission December 18 through January 6 for issues including closed wedge compression fracture of the T11 vertebrae as well as aspiration pneumonia.  Patient otherwise stable inpatient rehab course per the daughter.  No reported falls no worsening weakness.  No abdominal pain.  No reported nausea or vomiting.  Symptoms had sudden onset.  No reported diarrhea.  Per the daughter, patient does not know how to properly use inhalers associate with COPD at home. "   Admitted with influenza and COPD exacerbation Further hospital course and management as outlined below.   I assumed care on 3/5.   3/5 >> 3/11 -- Pt has been weaned down to 2-3 L/min oxygen, stable to downgrade to med/surg.  Unable to participate to any degree with PT and OT.  Requested Palliative care to re-visit for goals of care with family, as it does not appear that pt is capable to participate in rehab.       Assessment & Plan:   Influenza A S/p 5 day course tamiflu   Acute hypoxic respiratory failure Rapid response 2/27  morning for dyspnea, improved on bipap. Again required bipap for respiratory distress morning of 3/2. Now stable. CTA 3/3 no PE or other  complications but severe baseline emphysema. No baseline hypercarbia to qualify for bipap. Discuss with pulm today, given no osa diagnosis also won't qualify for cpap at discharge. --Continue supplemental O2 to keep sats >=90%, wean as tolerated   Hypokalemia  --monitor and supplement PRN   Copd with exacerbation 2/2 flu.  - slowly prednisone given his prolonged course 20  >> 10  and stop  --cont hypertonic saline neb with DuoNeb   Baseline dementia At baseline currently - home memantine - delirium precaution - hospice appropriate, unable to progress with therapy --palliative care provider following   HFmrEF with mild exacerbation Volume up clinically, bnp elevated.  Ef 40-45 on tte, this is a new problem, w/ regional wall motion abnormalities though no chest pain and only minimal trop elevation. Have reviewed w/ dr. Graciela Husbands of cardiology, he advises no additional w/u for the time being --cont oral lasix 40 mg daily   T11-12 fractures status post fall  -underlying ankylosing spondylitis Recent hospitalization for this, s/p rehab stay   Anemia of chronic disease Stable H&H, monitor   Essential (primary) hypertension BP low normal, not on meds at home - monitor   Cerebral aneurysm, nonruptured CT head recent hospitalization shows stable aneurysm   Prostate cancer metastatic to bone (HCC) --cont home Nubeqa  Dysphagia --at risk for aspiration.  During previous hospitalization, SLP rec dys 3 with nectar thick diet --SLP eval   DVT prophylaxis: Lovenox SQ Code Status: DNR  Family Communication: daughter updated at bedside today Level of care: Med-Surg Dispo:  The patient is from: home Anticipated d/c is to: to be determined Anticipated d/c date is: to be determined   Subjective and Interval History:  Pt reported breathing improved.  Breath sounds more clear after hypertonic saline nebs.   Objective: Vitals:   12/02/23 0409 12/02/23 0411 12/02/23 0822 12/02/23  1410  BP:  (!) 140/81 123/69 (!) 101/54  Pulse:  (!) 50 (!) 51 95  Resp:  18 18 20   Temp:  99.3 F (37.4 C) 98.9 F (37.2 C) (!) 97.5 F (36.4 C)  TempSrc:  Oral Oral Oral  SpO2:  92% 90% 95%  Weight: 60.2 kg     Height:        Intake/Output Summary (Last 24 hours) at 12/02/2023 1857 Last data filed at 12/02/2023 1300 Gross per 24 hour  Intake 120 ml  Output 1200 ml  Net -1080 ml   Filed Weights   11/29/23 0600 11/30/23 0500 12/02/23 0409  Weight: 61.1 kg 61.1 kg 60.2 kg    Examination:   Constitutional: NAD, AAOx3, cachectic  HEENT: conjunctivae and lids normal, EOMI CV: No cyanosis.   RESP: normal respiratory effort Extremities: No effusions, edema in BLE SKIN: warm, dry   Data Reviewed: I have personally reviewed labs and imaging studies  Time spent: 35 minutes  Darlin Priestly, MD [facility] If 7PM-7AM, please contact night-coverage 12/02/2023, 6:57 PM

## 2023-12-02 NOTE — Progress Notes (Signed)
 Palliative Care Progress Note, Assessment & Plan   Patient Name: Steven Elliott       Date: 12/02/2023 DOB: 07-09-1930  Age: 89 y.o. MRN#: [id number] Attending Physician: Darlin Priestly, MD Primary Care Physician: [facility] Admit Date: 11/17/2023  Subjective: Patient is lying in bed in no apparent distress.  He is asleep but easily awakens to my presence.  His gaze meets mine and he says hello.  He is minimally interactive but appropriate during our discussions.  [name] is at bedside during my visit.  HPI: 89 y.o. male  with past medical history of dementia, CKD, COPD, hyponatremia, prostate cancer admitted on 11/17/2023 with complaints of shortness of breath and generalized weakness.   Patient is being treated for acute respiratory failure with hypoxia, COPD exacerbation, cellulitis, and weakness.   Radiograph of chest revealed COPD with bibasilar atelectasis/infiltrates.  CT of chest revealed no evidence of pulmonary embolism, small bilateral pleural effusions (left greater than right) with associated bilateral lower lobe atelectasis, aortic atherosclerosis, and emphysema.   PMT was consulted to support patient and family with goals of care conversations.  Summary of counseling/coordination of care: Extensive chart review completed prior to meeting patient including labs, vital signs, imaging, progress notes, orders, and available advanced directive documents from current and previous encounters.   After reviewing the patient's chart and assessing the patient at bedside, I spoke with patient and [name] in regards to symptom management and goals of care.   I attempted to elicit values and what is important to patient.  He states, "I will do what ever is necessary".   Daughter and I discussed that next steps include either participating with PT/OT and going to SNF for rehab or returning home with current level of services or returning home with hospice services.  After extensive detail of each option given to patient, he says, "how much time do I have left?".  When asked to further explain what he is asking, he shares, "how much more time to have left in the service".  Patient's mentation and ability to engage in medical decision making goals of care discussions waxes and wanes.  Patient unable to make decisions independently at this time.  His daughter at bedside believes that SNF for rehab would be an appropriate as he is too weak.  She also believes that he would be more comfortable and have a better quality of life if he were to return home.  He shares patient has always stated that he would have quality over quantity of life.  Discussed that PT and OT have attempted to work with patient and that it has not been successful during this hospitalization. Discussed patient's functional status as a significant contributing factor to patient's overall prognosis.    Additionally, discussed his nutritional status is poor.  Patient has only had sips of water and bites of very few meals during this 15-day hospitalization.  Again, we discussed dementia as a chronic, progressive, and irreversible disease.  [name] has a clear understanding that patient's overall prognosis is poor.  He is not actively dying but is not able to rehab.  Discussed keeping what level of function he has and keeping  him comfortable at home.  She shares appreciation and understanding of our discussion.  I highlighted that PMT's role is to be a layer of support and to make patient's wishes known. Encouraged [name] to speak with her sister Steven Elliott and together they make a decision that is best for their father.    Given that patient is a DNR with limited interventions and MOST form is completed, goals are  clear.  Discharge planning is challenge and family is considering best option at this time.  TOC is following closely.  PMT will continue to follow and support patient and family throughout his hospitalization.  Physical Exam Vitals reviewed.  Constitutional:      Comments: Thin, frail  HENT:     Head:     Comments: Temporal wasting Cardiovascular:     Rate and Rhythm: Normal rate.  Pulmonary:     Breath sounds: Examination of the right-lower field reveals decreased breath sounds. Examination of the left-lower field reveals decreased breath sounds. Decreased breath sounds present.  Musculoskeletal:     Comments: Generalized weakness  Skin:    General: Skin is warm and dry.  Neurological:     Mental Status: He is alert.     Comments: Oriented to self  Psychiatric:        Mood and Affect: Mood is not anxious.        Behavior: Behavior normal. Behavior is not agitated.             Total Time 35 minutes   Time spent includes: Detailed review of medical records (labs, imaging, vital signs), medically appropriate exam (mental status, respiratory, cardiac, skin), discussed with treatment team, counseling and educating patient, family and staff, documenting clinical information, medication management and coordination of care.  [name] L. Bonita Quin, DNP, FNP-BC Palliative Medicine Team

## 2023-12-03 DIAGNOSIS — F0390 Unspecified dementia without behavioral disturbance: Secondary | ICD-10-CM | POA: Diagnosis not present

## 2023-12-03 DIAGNOSIS — J9601 Acute respiratory failure with hypoxia: Secondary | ICD-10-CM | POA: Diagnosis not present

## 2023-12-03 DIAGNOSIS — C61 Malignant neoplasm of prostate: Secondary | ICD-10-CM | POA: Diagnosis not present

## 2023-12-03 DIAGNOSIS — J441 Chronic obstructive pulmonary disease with (acute) exacerbation: Secondary | ICD-10-CM | POA: Diagnosis not present

## 2023-12-03 LAB — CBC
HCT: 33.9 % — ABNORMAL LOW (ref 39.0–52.0)
Hemoglobin: 11.4 g/dL — ABNORMAL LOW (ref 13.0–17.0)
MCH: 33.7 pg (ref 26.0–34.0)
MCHC: 33.6 g/dL (ref 30.0–36.0)
MCV: 100.3 fL — ABNORMAL HIGH (ref 80.0–100.0)
Platelets: 302 10*3/uL (ref 150–400)
RBC: 3.38 MIL/uL — ABNORMAL LOW (ref 4.22–5.81)
RDW: 16.2 % — ABNORMAL HIGH (ref 11.5–15.5)
WBC: 10.4 10*3/uL (ref 4.0–10.5)
nRBC: 0 % (ref 0.0–0.2)

## 2023-12-03 LAB — BASIC METABOLIC PANEL
Anion gap: 12 (ref 5–15)
BUN: 26 mg/dL — ABNORMAL HIGH (ref 8–23)
CO2: 28 mmol/L (ref 22–32)
Calcium: 8 mg/dL — ABNORMAL LOW (ref 8.9–10.3)
Chloride: 96 mmol/L — ABNORMAL LOW (ref 98–111)
Creatinine, Ser: 0.81 mg/dL (ref 0.61–1.24)
GFR, Estimated: >60 mL/min (ref >60)
Glucose, Bld: 101 mg/dL — ABNORMAL HIGH (ref 70–99)
Potassium: 3 mmol/L — ABNORMAL LOW (ref 3.5–5.1)
Sodium: 136 mmol/L (ref 135–145)

## 2023-12-03 LAB — MAGNESIUM: Magnesium: 1.9 mg/dL (ref 1.7–2.4)

## 2023-12-03 MED ORDER — POTASSIUM CHLORIDE 20 MEQ PO PACK
40.0000 meq | PACK | ORAL | Status: DC
  Filled 2023-12-03: qty 2

## 2023-12-03 MED ORDER — NEPRO/CARBSTEADY PO LIQD
237.0000 mL | Freq: Three times a day (TID) | ORAL | Status: DC
  Administered 2023-12-03 – 2023-12-08 (×9): 237 mL via ORAL

## 2023-12-03 MED ORDER — PANTOPRAZOLE SODIUM 40 MG PO TBEC
40.0000 mg | DELAYED_RELEASE_TABLET | Freq: Every day | ORAL | Status: DC
  Administered 2023-12-03 – 2023-12-07 (×5): 40 mg via ORAL
  Filled 2023-12-03 (×5): qty 1

## 2023-12-03 MED ORDER — POTASSIUM CHLORIDE CRYS ER 20 MEQ PO TBCR
40.0000 meq | EXTENDED_RELEASE_TABLET | ORAL | Status: AC
  Administered 2023-12-03 (×2): 40 meq via ORAL
  Filled 2023-12-03 (×2): qty 2

## 2023-12-03 NOTE — Progress Notes (Signed)
 PT Cancellation Note  Patient Details Name: Steven Elliott MRN: [id number] DOB: Sep 18, 1930   Cancelled Treatment:     Pt resting in bed, not able to keep eyes open to verbal cues, daughter at bedside. Pt appearing flushed, temp assessed at 99.1 nursing notified. Pt unable to tolerate skilled PT this date and currently concerned about overall tolerance. Will have primary PT assess POC and adjust appropriately. Pt has not made progress towards PT goals in 2 weeks.  Daughter at bedside states she will take him home once his chest congestion clears. She also endorses his poor tolerance and inability to participate in skilled PT services.   Jannet Askew 12/03/2023, 12:48 PM

## 2023-12-03 NOTE — TOC Progression Note (Signed)
 Transition of Care [facility]) - Progression Note    Patient Details  Name: Steven Elliott MRN: [id number] Date of Birth: 03-Aug-1930  Transition of Care [facility]) CM/SW Contact  Liliana Cline, LCSW Phone Number: 12/03/2023, 10:31 AM  Clinical Narrative:    CSW attempted call to patient's daughter [name] to follow up. Left VM requesting a return call. Asked RN to let me know if daughter comes to bedside today.        Expected Discharge Plan and Services                                               Social Determinants of Health (SDOH) Interventions SDOH Screenings   Food Insecurity: Patient Declined (11/17/2023)  Housing: Patient Declined (11/17/2023)  Transportation Needs: Patient Declined (11/17/2023)  Utilities: Patient Declined (11/17/2023)  Social Connections: Unknown (11/17/2023)  Tobacco Use: Medium Risk (11/17/2023)    Readmission Risk Interventions    09/09/2023    1:41 PM  Readmission Risk Prevention Plan  Transportation Screening Complete  PCP or Specialist Appt within 3-5 Days Complete  HRI or Home Care Consult Complete  Social Work Consult for Recovery Care Planning/Counseling Complete  Palliative Care Screening Complete  Medication Review Oceanographer) Complete

## 2023-12-03 NOTE — Progress Notes (Signed)
 PROGRESS NOTE    Steven Elliott  QMV:[id number] DOB: 07-Apr-1930 DOA: 11/17/2023 [facility]CP: [facility]  215A/215A-AA  LOS: 16 days   Brief hospital course:   Assessment & Plan:   "Steven Elliott is a 89 y.o. male with medical history significant of dementia, chronic kidney disease, COPD, hyponatremia prostate cancer presenting with acute respiratory failure with hypoxia, COPD exacerbation, SIRS, weakness.  History primarily from patient's daughter in the setting of decompensated respiratory failure.  Patient noted to have been found shortness of breath with generalized rhonchi at home by EMS.  Patient noted to be sick x 3 days with URI symptoms.  Positive sick contact with similar symptoms pending evaluation today.  No fevers or chills.  Noted recent admission December 18 through January 6 for issues including closed wedge compression fracture of the T11 vertebrae as well as aspiration pneumonia.  Patient otherwise stable inpatient rehab course per the daughter.  No reported falls no worsening weakness.  No abdominal pain.  No reported nausea or vomiting.  Symptoms had sudden onset.  No reported diarrhea.  Per the daughter, patient does not know how to properly use inhalers associate with COPD at home. "   Admitted with influenza and COPD exacerbation Further hospital course and management as outlined below.   I assumed care on 3/5.   3/5 >> 3/11 -- Pt has been weaned down to 2-3 L/min oxygen, stable to downgrade to med/surg.  Unable to participate to any degree with PT and OT.  Requested Palliative care to re-visit for goals of care with family, as it does not appear that pt is capable to participate in rehab.       Assessment & Plan:   Influenza A S/p 5 day course tamiflu   Acute hypoxic respiratory failure Rapid response 2/27  morning for dyspnea, improved on bipap. Again required bipap for respiratory distress morning of 3/2. Now stable. CTA 3/3 no PE or other  complications but severe baseline emphysema. No baseline hypercarbia to qualify for bipap. Discuss with pulm today, given no osa diagnosis also won't qualify for cpap at discharge. --currently on 2L O2 --Continue supplemental O2 to keep sats >=90%, wean as tolerated   Hypokalemia  --monitor and supplement PRN   Copd with exacerbation 2/2 flu.  --completed steroid burst and prednisone taper --cont hypertonic saline nebs with DuoNeb   Baseline dementia At baseline currently - home memantine - delirium precaution - hospice appropriate, unable to progress with therapy --palliative care provider following   HFmrEF with mild exacerbation Volume up clinically, bnp elevated.  Ef 40-45 on tte, this is a new problem, w/ regional wall motion abnormalities though no chest pain and only minimal trop elevation. Have reviewed w/ dr. Graciela Husbands of cardiology, he advises no additional w/u for the time being --hold lasix for now due to poor oral intake   T11-12 fractures status post fall  -underlying ankylosing spondylitis Recent hospitalization for this, s/p rehab stay   Anemia of chronic disease Stable H&H, monitor   Essential (primary) hypertension BP low normal, not on meds at home - monitor   Cerebral aneurysm, nonruptured CT head recent hospitalization shows stable aneurysm   Prostate cancer metastatic to bone (HCC) --cont home Nubeqa  Dysphagia --at risk for aspiration.  During previous hospitalization, SLP rec dys 3 with nectar thick diet   DVT prophylaxis: Lovenox SQ Code Status: DNR  Family Communication: daughter updated at bedside today Level of care: Med-Surg Dispo:   The patient is  from: home Anticipated d/c is to: home (daughter [name] declined SNF) Anticipated d/c date is: 3/16   Subjective and Interval History:  No new event today.  Daughter admitted congestion had improved after the hypertonic saline nebs.   Objective: Vitals:   12/03/23 0427 12/03/23 0429  12/03/23 0800 12/03/23 1450  BP:  127/65 117/70 (!) 108/51  Pulse:  (!) 107 (!) 103 84  Resp:  19 18 19   Temp:  98.9 F (37.2 C) 97.7 F (36.5 C) 97.9 F (36.6 C)  TempSrc:      SpO2:  93% 95% 94%  Weight: 59.5 kg     Height:        Intake/Output Summary (Last 24 hours) at 12/03/2023 1837 Last data filed at 12/03/2023 0800 Gross per 24 hour  Intake --  Output 300 ml  Net -300 ml   Filed Weights   11/30/23 0500 12/02/23 0409 12/03/23 0427  Weight: 61.1 kg 60.2 kg 59.5 kg    Examination:   Constitutional: NAD, lethargic and cachetic HEENT: conjunctivae and lids normal, EOMI CV: No cyanosis.   RESP: normal respiratory effort, on 2L SKIN: warm, dry   Data Reviewed: I have personally reviewed labs and imaging studies  Time spent: 35 minutes  Darlin Priestly, MD [facility] If 7PM-7AM, please contact night-coverage 12/03/2023, 6:37 PM

## 2023-12-03 NOTE — Progress Notes (Signed)
 Palliative Care Progress Note, Assessment & Plan   Patient Name: Steven Elliott       Date: 12/03/2023 DOB: 1929/10/20  Age: 89 y.o. MRN#: [id number] Attending Physician: Darlin Priestly, MD Primary Care Physician: [facility] Admit Date: 11/17/2023  Subjective: Patient lying in bed in no apparent distress. He is thin, frail, with temporal wasting.  His daughter [name] is at bedside during my visit.  She endorses that he sat up, ate his breakfast and has had a pleasant conversation with her this morning.  HPI: 89 y.o. male  with past medical history of dementia, CKD, COPD, hyponatremia, prostate cancer admitted on 11/17/2023 with complaints of shortness of breath and generalized weakness.   Patient is being treated for acute respiratory failure with hypoxia, COPD exacerbation, cellulitis, and weakness.   Radiograph of chest revealed COPD with bibasilar atelectasis/infiltrates.  CT of chest revealed no evidence of pulmonary embolism, small bilateral pleural effusions (left greater than right) with associated bilateral lower lobe atelectasis, aortic atherosclerosis, and emphysema.   PMT was consulted to support patient and family with goals of care conversations.  Summary of counseling/coordination of care: Extensive chart review completed prior to meeting patient including labs, vital signs, imaging, progress notes, orders, and available advanced directive documents from current and previous encounters.   After reviewing the patient's chart and assessing the patient at bedside, I spoke with patient and his daughter [name] in regards to symptom management and goals of care.   Symptoms assessed.  Patient is breathing with respirations even and unlabored.  He awakens to my presence but quickly falls  back to sleep.  No signs of distress noted.  [name] endorses that he has not had any pain or acute issues at this time.  No adjustment to [facility] needed.  I attempted to elicit values and goals important to patient and [name].  [name] shares that she knows the patient cannot go to rehab.  However, she is hopeful that she will be able to take him back home and continue with the current regimen of home health she has organize through the [place].  [name] shares she has a clear understanding of patient's current medical situation.  She shares that she knows he may not be better and that she may need to call hospice in the near future.  However, she would like to take him home, remain hopeful, and see what happens.  I again confirmed DNR with limited interventions.  Goals are clear.  PMT will step back from daily visits and monitor the patient peripherally.  Roe Coombs is aware to reach out to PMT with any acute palliative needs.  Physical Exam Vitals reviewed.  Constitutional:      General: He is not in acute distress.    Appearance: He is normal weight.  HENT:     Head: Normocephalic.  Eyes:     Pupils: Pupils are equal, round, and reactive to light.  Cardiovascular:     Rate and Rhythm: Normal rate.  Pulmonary:     Effort: Pulmonary effort is normal.     Breath sounds: Examination of the right-lower field reveals decreased breath sounds. Examination of the left-lower field reveals decreased breath sounds. Decreased breath  sounds present.  Musculoskeletal:     Comments: Generalized weakness, MAETC  Skin:    General: Skin is warm and dry.  Neurological:     Mental Status: He is alert.  Psychiatric:        Mood and Affect: Mood is not anxious.        Behavior: Behavior normal. Behavior is not agitated.             Total Time 25 minutes   Time spent includes: Detailed review of medical records (labs, imaging, vital signs), medically appropriate exam (mental status, respiratory, cardiac, skin),  discussed with treatment team, counseling and educating patient, family and staff, documenting clinical information, medication management and coordination of care.  [name] L. Bonita Quin, DNP, FNP-BC Palliative Medicine Team

## 2023-12-03 NOTE — Progress Notes (Signed)
 SLP Cancellation Note  Patient Details Name: Steven Elliott MRN: [id number] DOB: [birth year]   Cancelled treatment:       Reason Eval/Treat Not Completed: Medical issues which prohibited therapy  Consult received as well as family's request for potential upgrade to thin liquids. Pt's chart reviewed. Per chart, pt continues to experience medical fragility and weakness with inability to participate in therapies as well as continued reduced intake. Education provided with pt's attending and Palliative Care NP on increased risk of aspiration and recommendation that upgrade to thin liquids be considered by family as part of Palliative Care/Comfort Services. Attending in agreement and will consult should pt's condition improve.   Order to be discharged.   Karlissa Aron B. Dreama Saa, M.S., CCC-SLP, CBIS Speech-Language Pathologist Certified Brain Injury Specialist [facility] [phone] Ascom [phone] Fax [phone]     Reuel Derby 12/03/2023, 12:22 PM

## 2023-12-03 NOTE — Progress Notes (Signed)
 Nutrition Follow-up  DOCUMENTATION CODES:   Severe malnutrition in context of chronic illness  INTERVENTION:   Nepro Shake po TID, each supplement provides 425 kcal and 19 grams protein  Magic cup TID with meals, each supplement provides 290 kcal and 9 grams of protein  MVI po daily   Vitamin 500mg  po BID   Daily weights   NUTRITION DIAGNOSIS:   Severe Malnutrition related to chronic illness as evidenced by severe fat depletion, severe muscle depletion. -ongoing   GOAL:   Patient will meet greater than or equal to 90% of their needs -progressing   MONITOR:   PO intake, Supplement acceptance, Labs, Weight trends, I & O's, Skin  ASSESSMENT:   89 y/o male with h/o COPD, dementia, metastatic prostate cancer, HTN, cerebral aneurysm, BPH, CKD III, PVD, CHF, GERD and compression fracture who is admitted with Flu A and COPD exacerbation.  Pt continues to have fairly good appetite and oral intake; pt eating 25-100% of meals and is drinking Ensure supplements. RD will change Ensure to Nepro as Nepro is nectar thick. Will recheck phosphorus lab tomorrow. Per chart, pt is down ~23lbs since admission. Pt -8.7L on his I & Os.   Medications reviewed and include: vitamin C, lovenox, lasix, MVI, protonix, KCl, prednisone  Labs reviewed: K 3.0(L), BUN 26(H), Mg 1.9 wnl  Diet Order:   Diet Order             DIET DYS 3 Room service appropriate? Yes; Fluid consistency: Nectar Thick  Diet effective now                  EDUCATION NEEDS:   Education needs have been addressed  Skin:  Skin Assessment: Skin Integrity Issues: Skin Integrity Issues:: Stage I, Other (Comment) Stage I: lt back Other: skin tear to lt lower arm, skin tear to lt finger  Last BM:  3/14- type 6  Height:   Ht Readings from Last 1 Encounters:  11/18/23 5\' 8"  (1.727 m)    Weight:   Wt Readings from Last 1 Encounters:  12/03/23 59.5 kg    Ideal Body Weight:  70 kg  BMI:  Body mass index is  19.94 kg/m.  Estimated Nutritional Needs:   Kcal:  1700-1900kcal/day  Protein:  85-95g/day  Fluid:  1.6-1.8L/day  Betsey Holiday MS, RD, LDN If unable to be reached, please send secure chat to "RD inpatient" available from 8:00a-4:00p daily

## 2023-12-03 NOTE — Progress Notes (Signed)
 PHARMACIST - PHYSICIAN COMMUNICATION   CONCERNING: IV to Oral Route Change Policy  RECOMMENDATION: This patient is receiving Protonix by the intravenous route.  Based on criteria approved by the Pharmacy and Therapeutics Committee, the intravenous medication(s) is/are being converted to the equivalent oral dose form(s).   DESCRIPTION: These criteria include: The patient is eating (either orally or via tube) and/or has been taking other orally administered medications for a least 24 hours The patient has no evidence of active gastrointestinal bleeding or impaired GI absorption (gastrectomy, short bowel, patient on TNA or NPO).  If you have questions about this conversion, please contact the Pharmacy Department  []   [phone] )  [facility] [x]   [phone] )  [facility] []   [phone] )  [facility] []   [phone] )  [facility] []   [phone] )  [facility]   Bari Mantis PharmD Clinical Pharmacist 12/03/2023

## 2023-12-04 ENCOUNTER — Inpatient Hospital Stay

## 2023-12-04 DIAGNOSIS — J441 Chronic obstructive pulmonary disease with (acute) exacerbation: Secondary | ICD-10-CM | POA: Diagnosis not present

## 2023-12-04 LAB — BASIC METABOLIC PANEL
Anion gap: 7 (ref 5–15)
BUN: 26 mg/dL — ABNORMAL HIGH (ref 8–23)
CO2: 26 mmol/L (ref 22–32)
Calcium: 7.8 mg/dL — ABNORMAL LOW (ref 8.9–10.3)
Chloride: 103 mmol/L (ref 98–111)
Creatinine, Ser: 0.74 mg/dL (ref 0.61–1.24)
GFR, Estimated: >60 mL/min (ref >60)
Glucose, Bld: 95 mg/dL (ref 70–99)
Potassium: 3.6 mmol/L (ref 3.5–5.1)
Sodium: 136 mmol/L (ref 135–145)

## 2023-12-04 LAB — PHOSPHORUS: Phosphorus: 2.6 mg/dL (ref 2.5–4.6)

## 2023-12-04 LAB — GLUCOSE, CAPILLARY: Glucose-Capillary: 88 mg/dL (ref 70–99)

## 2023-12-04 MED ORDER — SODIUM CHLORIDE 0.9 % IV SOLN: INTRAVENOUS | Status: AC

## 2023-12-04 NOTE — Progress Notes (Signed)
       CROSS COVER NOTE  NAME: Steven Elliott MRN: [id number] DOB : 1930-02-03 ATTENDING PHYSICIAN: Darlin Priestly, MD    Date of Service   12/04/2023   HPI/Events of Note   Nurse reports family concerned patient may have aspirated Patient with hx of dementia, CKD, COPD, hyponatremia, prostate cancer with MET to bone. Admitted to hospital with acute on chronic resp failure with hypoxia and + flu   Interventions   Assessment/Plan:    12/04/2023    7:45 PM 12/04/2023    9:30 AM 12/04/2023    4:48 AM  Vitals with BMI  Weight   135 lbs 13 oz  BMI   20.65  Systolic 140 124   Diastolic 57 76   Pulse 87 99    Daughter at bedside reports upper airway wet cough frequent with inability to clear with cough Also reports diffence in communication today with talking through clednched teeth, difficult to understand Patietn minimally focused during exam left eye deviates to left and mild left labial flattening (however head is slighly tilted to left and unable to get midline secondary to pain with movement).  Daughter endorses she felt his lips look like he had s;light droop on left side today and has never noted his left eye deviating to left as a normal thing for him. NO extremity movement to command. Moans to pain with assessment Daughter states he does not like narcotics so she does not feel he needs anything for pain Da X X X

## 2023-12-04 NOTE — Progress Notes (Signed)
 PROGRESS NOTE    Steven Elliott  ZOX:[id number] DOB: March 19, 1930 DOA: 11/17/2023 [facility]: [facility]  215A/215A-AA  LOS: 17 days   Brief hospital course:   Assessment & Plan:   "Steven Elliott is a 89 y.o. male with medical history significant of dementia, chronic kidney disease, COPD, hyponatremia prostate cancer presenting with acute respiratory failure with hypoxia, COPD exacerbation, SIRS, weakness.  History primarily from patient's daughter in the setting of decompensated respiratory failure.  Patient noted to have been found shortness of breath with generalized rhonchi at home by EMS.  Patient noted to be sick x 3 days with URI symptoms.  Positive sick contact with similar symptoms pending evaluation today.  No fevers or chills.  Noted recent admission December 18 through January 6 for issues including closed wedge compression fracture of the T11 vertebrae as well as aspiration pneumonia.  Patient otherwise stable inpatient rehab course per the daughter.  No reported falls no worsening weakness.  No abdominal pain.  No reported nausea or vomiting.  Symptoms had sudden onset.  No reported diarrhea.  Per the daughter, patient does not know how to properly use inhalers associate with COPD at home. "   Admitted with influenza and COPD exacerbation Further hospital course and management as outlined below.   I assumed care on 3/5.   3/5 >> 3/11 -- Pt has been weaned down to 2-3 L/min oxygen, stable to downgrade to med/surg.  Unable to participate to any degree with PT and OT.  Requested Palliative care to re-visit for goals of care with family, as it does not appear that pt is capable to participate in rehab.       Assessment & Plan:   Influenza A S/p 5 day course tamiflu   Acute hypoxic respiratory failure Rapid response 2/27  morning for dyspnea, improved on bipap. Again required bipap for respiratory distress morning of 3/2. Now stable. CTA 3/3 no PE or other  complications but severe baseline emphysema. No baseline hypercarbia to qualify for bipap. Discuss with pulm today, given no osa diagnosis also won't qualify for cpap at discharge. --currently on 2L O2 --will qualify for home O2 tomorrow   Hypokalemia  --monitor and supplement PRN   Copd with exacerbation 2/2 flu.  --completed steroid burst and prednisone taper --cont hypertonic saline nebs with DuoNeb   Baseline dementia At baseline currently - home memantine - delirium precaution - hospice appropriate, unable to progress with therapy --palliative care provider following   HFmrEF with mild exacerbation Volume up clinically, bnp elevated.  Ef 40-45 on tte, this is a new problem, w/ regional wall motion abnormalities though no chest pain and only minimal trop elevation. Have reviewed w/ dr. Graciela Husbands of cardiology, he advises no additional w/u for the time being --hold lasix due to poor oral intake   T11-12 fractures status post fall  -underlying ankylosing spondylitis Recent hospitalization for this, s/p rehab stay   Anemia of chronic disease Stable H&H, monitor   Essential (primary) hypertension BP low normal, not on meds at home - monitor   Cerebral aneurysm, nonruptured CT head recent hospitalization shows stable aneurysm   Prostate cancer metastatic to bone (HCC) --cont home Nubeqa  Dysphagia --at risk for aspiration.  During previous hospitalization, SLP rec dys 3 with nectar thick diet --this morning, RN noted pt's swallowing has significantly worsened.  coughing a lot.   --Daughter Steven Elliott was not ready to make a decision for or against feeding tube.   DVT prophylaxis:  Lovenox SQ Code Status: DNR  Family Communication: daughter Steven Elliott updated at bedside today Level of care: Med-Surg Dispo:   The patient is from: home Anticipated d/c is to: home (daughter Steven Elliott declined SNF) Anticipated d/c date is: Steven Elliott said she has to get DME from [place] before  discharge.   Subjective and Interval History:  RN noted pt's swallowing has significantly worsened.  coughing a lot.  Pt was constantly asking for water.  Daughter Steven Elliott was not ready to make a decision for or against feeding tube.   Objective: Vitals:   12/04/23 0414 12/04/23 0448 12/04/23 0723 12/04/23 0930  BP: (!) 116/54   124/76  Pulse: 90   99  Resp: 12   18  Temp: (!) 97.3 F (36.3 C)   98.3 F (36.8 C)  TempSrc:      SpO2: 96%  93% 91%  Weight:  61.6 kg    Height:        Intake/Output Summary (Last 24 hours) at 12/04/2023 1707 Last data filed at 12/04/2023 1100 Gross per 24 hour  Intake 0 ml  Output --  Net 0 ml   Filed Weights   12/02/23 0409 12/03/23 0427 12/04/23 0448  Weight: 60.2 kg 59.5 kg 61.6 kg    Examination:   Constitutional: NAD, lethargic, cachectic  CV: No cyanosis.   RESP: normal respiratory effort, on 2L Extremities: No effusions, edema in BLE SKIN: warm, dry   Data Reviewed: I have personally reviewed labs and imaging studies  Time spent: 35 minutes  Darlin Priestly, MD [facility] If 7PM-7AM, please contact night-coverage 12/04/2023, 5:07 PM

## 2023-12-04 NOTE — Progress Notes (Signed)
 Patient's daughter [name] voices concerns about patient change in mentation, increase swallowing difficulty and cough. NP Jon Billings made aware and at bedside. New orders placed Breathing Tx administered, patient remains on 2 L O2. Respiratory Therapy made aware of new order for suction.  11pm: patient's breathing sounds improved. Patient calm and resting. Family at bedside

## 2023-12-04 NOTE — TOC Progression Note (Addendum)
 Transition of Care [facility]) - Progression Note    Patient Details  Name: Steven Elliott MRN: [id number] Date of Birth: 08/15/1930  Transition of Care [facility]) CM/SW Contact  Liliana Cline, LCSW Phone Number: 12/04/2023, 11:31 AM  Clinical Narrative:    CSW attempted another call to daughter [name] - left another VM requesting a return call. RN reported [name] told him yesterday that she has not made a decision yet about SNF. Asked MD if patient is medically ready.  12:25- Per MD, [name] wants to take patient home, no longer wants SNF. CSW is awaiting return call from [place]. [name] requesting DME including home o2 and says she needs to call the [place] Monday- CSW called Ada with [employer] who confirms if a patient does not have part B, they would have to get their DME from the [place].         Expected Discharge Plan and Services                                               Social Determinants of Health (SDOH) Interventions SDOH Screenings   Food Insecurity: Patient Declined (11/17/2023)  Housing: Patient Declined (11/17/2023)  Transportation Needs: Patient Declined (11/17/2023)  Utilities: Patient Declined (11/17/2023)  Social Connections: Unknown (11/17/2023)  Tobacco Use: Medium Risk (11/17/2023)    Readmission Risk Interventions    09/09/2023    1:41 PM  Readmission Risk Prevention Plan  Transportation Screening Complete  PCP or Specialist Appt within 3-5 Days Complete  HRI or Home Care Consult Complete  Social Work Consult for Recovery Care Planning/Counseling Complete  Palliative Care Screening Complete  Medication Review Oceanographer) Complete

## 2023-12-05 DIAGNOSIS — J441 Chronic obstructive pulmonary disease with (acute) exacerbation: Secondary | ICD-10-CM | POA: Diagnosis not present

## 2023-12-05 MED ORDER — IPRATROPIUM-ALBUTEROL 0.5-2.5 (3) MG/3ML IN SOLN
3.0000 mL | Freq: Two times a day (BID) | RESPIRATORY_TRACT | Status: DC
  Administered 2023-12-05 – 2023-12-08 (×6): 3 mL via RESPIRATORY_TRACT
  Filled 2023-12-05 (×7): qty 3

## 2023-12-05 MED ORDER — SODIUM CHLORIDE 3 % IN NEBU
4.0000 mL | INHALATION_SOLUTION | Freq: Two times a day (BID) | RESPIRATORY_TRACT | Status: AC
  Administered 2023-12-05 – 2023-12-07 (×5): 4 mL via RESPIRATORY_TRACT
  Filled 2023-12-05 (×6): qty 4

## 2023-12-05 NOTE — Progress Notes (Signed)
 PROGRESS NOTE    Steven Elliott  ZOX:[id number] DOB: [birth year] DOA: 11/17/2023 PCP: [facility]  215A/215A-AA  LOS: 18 days   Brief hospital course:   Assessment & Plan:   "Steven Elliott is a 89 y.o. male with medical history significant of dementia, chronic kidney disease, COPD, hyponatremia prostate cancer presenting with acute respiratory failure with hypoxia, COPD exacerbation, SIRS, weakness.  History primarily from patient's daughter in the setting of decompensated respiratory failure.  Patient noted to have been found shortness of breath with generalized rhonchi at home by EMS.  Patient noted to be sick x 3 days with URI symptoms.  Positive sick contact with similar symptoms pending evaluation today.  No fevers or chills.  Noted recent admission December 18 through January 6 for issues including closed wedge compression fracture of the T11 vertebrae as well as aspiration pneumonia.  Patient otherwise stable inpatient rehab course per the daughter.  No reported falls no worsening weakness.  No abdominal pain.  No reported nausea or vomiting.  Symptoms had sudden onset.  No reported diarrhea.  Per the daughter, patient does not know how to properly use inhalers associate with COPD at home. "   Admitted with influenza and COPD exacerbation Further hospital course and management as outlined below.   I assumed care on 3/5.   3/5 >> 3/11 -- Pt has been weaned down to 2-3 L/min oxygen, stable to downgrade to med/surg.  Unable to participate to any degree with PT and OT.  Requested Palliative care to re-visit for goals of care with family, as it does not appear that pt is capable to participate in rehab.       Assessment & Plan:   Influenza A S/p 5 day course tamiflu   Acute hypoxic respiratory failure Rapid response 2/27  morning for dyspnea, improved on bipap. Again required bipap for respiratory distress morning of 3/2. Now stable. CTA 3/3 no PE or other  complications but severe baseline emphysema. No baseline hypercarbia to qualify for bipap. Discuss with pulm today, given no osa diagnosis also won't qualify for cpap at discharge. --currently on 2L O2, with occasional O2 desat to 88% on 2L. --will need home 2L O2 after discharge.   Hypokalemia  --monitor and supplement PRN   Copd with exacerbation 2/2 flu.  --completed steroid burst and prednisone taper --cont hypertonic saline nebs with DuoNeb   Baseline dementia At baseline currently - home memantine - delirium precaution --palliative care provider following --daughter does not want hospice for pt.   HFmrEF with mild exacerbation Volume up clinically, bnp elevated.  Ef 40-45 on tte, this is a new problem, w/ regional wall motion abnormalities though no chest pain and only minimal trop elevation. Have reviewed w/ dr. Graciela Husbands of cardiology, he advises no additional w/u for the time being --hold lasix due to poor oral intake   T11-12 fractures status post fall  -underlying ankylosing spondylitis Recent hospitalization for this, s/p rehab stay   Anemia of chronic disease Stable H&H, monitor   Essential (primary) hypertension BP low normal, not on meds at home - monitor   Cerebral aneurysm, nonruptured CT head recent hospitalization shows stable aneurysm   Prostate cancer metastatic to bone (HCC) --cont home Nubeqa  Dysphagia --at high risk for aspiration, and have shown signs of aspiration.  During previous hospitalization, SLP rec dys 3 with nectar thick diet --Pt's mental status was much better this morning, alert and more energetic.  I was able to ask  pt if he would want feeding tube if he couldn't safely swallow, pt said in front of his daughters [name] at bedside and [name] on speaker phone) that he does NOT want feeding tube.   DVT prophylaxis: Lovenox SQ Code Status: DNR  Family Communication: daughter [name] updated at bedside, and daughter [name] updated on speaker  phone today Level of care: Med-Surg Dispo:   The patient is from: home Anticipated d/c is to: home (daughter [name] declined SNF) Anticipated d/c date is: [name] said she has to get DME from [place] before discharge.   Subjective and Interval History:  Pt's mental status was much better this morning, alert and more energetic.  I was able to ask pt if he would want feeding tube if he couldn't safely swallow, pt said in front of his daughters [name] at bedside and [name] on speaker phone) that he does NOT want feeding tube.   Objective: Vitals:   12/05/23 0713 12/05/23 0753 12/05/23 0822 12/05/23 1510  BP:  (!) 130/56 137/81   Pulse:  (!) 109 (!) 118 (!) (P) 102  Resp:  20 (!) 22   Temp:  98.6 F (37 C) 99.2 F (37.3 C)   TempSrc:  Axillary    SpO2: 95% 90% (!) 88% (P) 90%  Weight:      Height:        Intake/Output Summary (Last 24 hours) at 12/05/2023 1541 Last data filed at 12/05/2023 0315 Gross per 24 hour  Intake 457.81 ml  Output 200 ml  Net 257.81 ml   Filed Weights   12/03/23 0427 12/04/23 0448 12/05/23 0413  Weight: 59.5 kg 61.6 kg 60.7 kg    Examination:   Constitutional: NAD, alert today and coherent HEENT: conjunctivae and lids normal, EOMI CV: No cyanosis.   RESP: normal respiratory effort, on 2L Neuro: II - XII grossly intact.     Data Reviewed: I have personally reviewed labs and imaging studies  Time spent: 35 minutes  Darlin Priestly, MD [facility] If 7PM-7AM, please contact night-coverage 12/05/2023, 3:41 PM

## 2023-12-05 NOTE — TOC Progression Note (Addendum)
 Transition of Care [facility]) - Progression Note    Patient Details  Name: Steven Elliott MRN: [id number] Date of Birth: 1930/03/20  Transition of Care [facility]) CM/SW Contact  Liliana Cline, LCSW Phone Number: 12/05/2023, 12:32 PM  Clinical Narrative:    Received return call from daughter [name]. [name] confirms she would like to take patient home. She confirmed the address listed in the chart and states she lives with the patient there. [name] states patient was recently set up with [facility] and would like to resume those services upon discharge. [name] states she would like the [place] to provide oxygen, EMS transport, and catheter supplies. [name] states patient already has a hospital bed and BSC. CSW sent encrypted email to [place] at [facility] with the above requests, added weekday TOC.      Expected Discharge Plan and Services                                               Social Determinants of Health (SDOH) Interventions SDOH Screenings   Food Insecurity: Patient Declined (11/17/2023)  Housing: Patient Declined (11/17/2023)  Transportation Needs: Patient Declined (11/17/2023)  Utilities: Patient Declined (11/17/2023)  Social Connections: Unknown (11/17/2023)  Tobacco Use: Medium Risk (11/17/2023)    Readmission Risk Interventions    09/09/2023    1:41 PM  Readmission Risk Prevention Plan  Transportation Screening Complete  PCP or Specialist Appt within 3-5 Days Complete  HRI or Home Care Consult Complete  Social Work Consult for Recovery Care Planning/Counseling Complete  Palliative Care Screening Complete  Medication Review Oceanographer) Complete

## 2023-12-05 NOTE — Progress Notes (Signed)
 Daily Progress Note   Patient Name: Steven Elliott       Date: 12/05/2023 DOB: [birth year]  Age: 89 y.o. MRN#: [id number] Attending Physician: Darlin Priestly, MD Primary Care Physician: [facility] Admit Date: 11/17/2023  Reason for Consultation/Follow-up: Establishing goals of care  HPI/Brief Hospital Review: 89 y.o. male  with past medical history of dementia, CKD, COPD, hyponatremia, prostate cancer admitted on 11/17/2023 with complaints of shortness of breath and generalized weakness.   Patient is being treated for acute respiratory failure with hypoxia, COPD exacerbation, cellulitis, and weakness.   Radiograph of chest revealed COPD with bibasilar atelectasis/infiltrates.  CT of chest revealed no evidence of pulmonary embolism, small bilateral pleural effusions (left greater than right) with associated bilateral lower lobe atelectasis, aortic atherosclerosis, and emphysema.   PMT was consulted to support patient and family with goals of care conversations.  3/16 notified that daughter-Steven Elliott requesting a return call from PMT provider  Subjective: Extensive chart review has been completed prior to meeting patient including labs, vital signs, imaging, progress notes, orders, and available advanced directive documents from current and previous encounters.    Visited with Steven Elliott at his bedside. He is awake, alert, drinking thickened liquids without overt signs of aspiration. Unable to fully assess orientation/capacity as during assessment Steven Elliott primarily unable to stay focused, joking and daughter interjects frequently. Steven Elliott at bedside shares her concern related to presentation yesterday as Steven Elliott was unable to swallow or communicate, CT head ordered (-) for  acute processes. Steven Elliott is please that Steven Elliott improved today, seems to be most interested in having Steven Elliott eat his breakfast.  Returned Steven Elliott call. She expresses concern related to fluctuation in mentation related to yesterday and shares these fluctuations have been intermittent since hospital admission. She and her sister have spoke and are concerned fluctuations in mentation are related to medications. Steven Elliott requests medication review, as shared with Steven Elliott unlikely for medications for be contributing to mentation fluctuation as he has been taking same medication and dosages for several years. No new medications or recent adjustments to dosing. Attempted to discuss disease trajectory of dementia and Steven Elliott other underlying comorbid conditions. Steven Elliott shares she is aware mentation fluctuation can be related to underlying dementia but wants to ensure all other contributing factors are being addressed.  Concerns passed  along to primary team. Answered and addressed all questions and concerns. PMT to continue to follow for ongoing needs and support.  Thank you for allowing the Palliative Medicine Team to assist in the care of this patient.  Total time:  50 minutes  Time spent includes: Detailed review of medical records (labs, imaging, vital signs), medically appropriate exam (mental status, respiratory, cardiac, skin), discussed with treatment team, counseling and educating patient, family and staff, documenting clinical information, medication management and coordination of care.  Leeanne Deed, DNP, AGNP-C Palliative Medicine   Please contact Palliative Medicine Team phone at [phone] for questions and concerns.

## 2023-12-06 DIAGNOSIS — J441 Chronic obstructive pulmonary disease with (acute) exacerbation: Secondary | ICD-10-CM | POA: Diagnosis not present

## 2023-12-06 LAB — BASIC METABOLIC PANEL
Anion gap: 7 (ref 5–15)
BUN: 16 mg/dL (ref 8–23)
CO2: 24 mmol/L (ref 22–32)
Calcium: 7.5 mg/dL — ABNORMAL LOW (ref 8.9–10.3)
Chloride: 105 mmol/L (ref 98–111)
Creatinine, Ser: 0.61 mg/dL (ref 0.61–1.24)
GFR, Estimated: >60 mL/min (ref >60)
Glucose, Bld: 95 mg/dL (ref 70–99)
Potassium: 3.3 mmol/L — ABNORMAL LOW (ref 3.5–5.1)
Sodium: 136 mmol/L (ref 135–145)

## 2023-12-06 LAB — CBC
HCT: 31.7 % — ABNORMAL LOW (ref 39.0–52.0)
Hemoglobin: 10.9 g/dL — ABNORMAL LOW (ref 13.0–17.0)
MCH: 34.3 pg — ABNORMAL HIGH (ref 26.0–34.0)
MCHC: 34.4 g/dL (ref 30.0–36.0)
MCV: 99.7 fL (ref 80.0–100.0)
Platelets: 255 10*3/uL (ref 150–400)
RBC: 3.18 MIL/uL — ABNORMAL LOW (ref 4.22–5.81)
RDW: 15 % (ref 11.5–15.5)
WBC: 8.6 10*3/uL (ref 4.0–10.5)
nRBC: 0 % (ref 0.0–0.2)

## 2023-12-06 LAB — MAGNESIUM: Magnesium: 1.8 mg/dL (ref 1.7–2.4)

## 2023-12-06 MED ORDER — POTASSIUM CHLORIDE CRYS ER 10 MEQ PO TBCR
10.0000 meq | EXTENDED_RELEASE_TABLET | ORAL | Status: AC
Start: 2023-12-06 — End: 2023-12-06
  Administered 2023-12-06 (×2): 10 meq via ORAL
  Filled 2023-12-06 (×2): qty 1

## 2023-12-06 MED ORDER — POTASSIUM CHLORIDE 10 MEQ/100ML IV SOLN
10.0000 meq | INTRAVENOUS | Status: DC
  Administered 2023-12-06: 10 meq via INTRAVENOUS
  Filled 2023-12-06 (×3): qty 100

## 2023-12-06 MED ORDER — DRONABINOL 2.5 MG PO CAPS
2.5000 mg | ORAL_CAPSULE | Freq: Two times a day (BID) | ORAL | Status: DC
  Administered 2023-12-06 – 2023-12-08 (×4): 2.5 mg via ORAL
  Filled 2023-12-06 (×4): qty 1

## 2023-12-06 NOTE — Progress Notes (Signed)
 Physical Therapy Treatment Patient Details Name: Steven Elliott MRN: [id number] DOB: March 27, 1930 Today's Date: 12/06/2023   History of Present Illness Patient is a 89 year old male with complaints of shortness of breath and generalized weakness, Found to have COPD exacerbation and influenza A. History of dementia, CKD, COPD, hyponatremia, prostate cancer, T1-12 fractures s/p fall, ankylosing spondylitis    PT Comments  Patient has made minimal to no progress towards meeting PT goals as he has not been able to be seen during the past several attempts due to level of alertness, fatigue, etc. He was sleeping during most of my visit today and declined participating with out of bed mobility. Education provided to the daughter on extremity handling for PROM of legs, bed mobility, body mechanics using bed functions to assist, turning schedule for skin integrity, etc. The family is planning to bring the patient home with family assistance and home health PT soon.    If plan is discharge home, recommend the following: Two people to help with walking and/or transfers;Two people to help with bathing/dressing/bathroom;Assist for transportation;Help with stairs or ramp for entrance;Supervision due to cognitive status;Assistance with cooking/housework;Assistance with feeding   Can travel by private vehicle     No  Equipment Recommendations  None recommended by PT    Recommendations for Other Services       Precautions / Restrictions Precautions Precautions: Fall Recall of Precautions/Restrictions: Impaired Restrictions Weight Bearing Restrictions Per Provider Order: No     Mobility  Bed Mobility               General bed mobility comments: total assistance for repositioning in bed. education provided to daughter at bedside on body mechanics with elevating position of the bed when providing bed level care for comfort. education also provided on schedule for repositioning/turning and using  pillows for off loading for skin integrity.    Transfers                        Ambulation/Gait                   Stairs             Wheelchair Mobility     Tilt Bed    Modified Rankin (Stroke Patients Only)       Balance                                            Communication Communication Communication: Impaired  Cognition Arousal: Lethargic Behavior During Therapy: Flat affect   PT - Cognitive impairments: History of cognitive impairments                       PT - Cognition Comments: patient sleeping through most of the session Following commands: Impaired      Cueing Cueing Techniques: Verbal cues  Exercises General Exercises - Lower Extremity Ankle Circles/Pumps: PROM, Both, 10 reps, Supine Heel Slides: PROM, Both, 10 reps, Supine Hip ABduction/ADduction: PROM, Strengthening, Both, 10 reps, Supine Other Exercises Other Exercises: provided PROM to LE (patient with no activate participation). education provided to daughter for handling techniques to assist with bed mobility and/or PROM at home    General Comments        Pertinent Vitals/Pain Pain Assessment Pain Assessment: PAINAD Breathing: normal Negative Vocalization: none Facial Expression: smiling  or inexpressive Body Language: relaxed Consolability: no need to console PAINAD Score: 0    Home Living                          Prior Function            PT Goals (current goals can now be found in the care plan section) Acute Rehab PT Goals Patient Stated Goal: family planning to bring patient home PT Goal Formulation: With family Time For Goal Achievement: 12/20/23 Potential to Achieve Goals: Fair Progress towards PT goals: Not progressing toward goals - comment;Goals downgraded-see care plan    Frequency    Min 1X/week      PT Plan      Co-evaluation              AM-PAC PT "6 Clicks" Mobility   Outcome  Measure  Help needed turning from your back to your side while in a flat bed without using bedrails?: A Lot Help needed moving from lying on your back to sitting on the side of a flat bed without using bedrails?: Total Help needed moving to and from a bed to a chair (including a wheelchair)?: Total Help needed standing up from a chair using your arms (e.g., wheelchair or bedside chair)?: Total Help needed to walk in hospital room?: Total Help needed climbing 3-5 steps with a railing? : Total 6 Click Score: 7    End of Session         PT Visit Diagnosis: Muscle weakness (generalized) (M62.81);Unsteadiness on feet (R26.81)     Time: 1610-9604 PT Time Calculation (min) (ACUTE ONLY): 19 min  Charges:    $Therapeutic Activity: 8-22 mins PT General Charges $$ ACUTE PT VISIT: 1 Visit                    Donna Bernard, PT, MPT    Ina Homes 12/06/2023, 2:18 PM

## 2023-12-06 NOTE — Progress Notes (Signed)
 PROGRESS NOTE    MCCALL LOMAX  VHQ:[id number] DOB: 1930-01-21 DOA: 11/17/2023 [facility]: [facility]  215A/215A-AA  LOS: 19 days   Brief hospital course:   Assessment & Plan:   "NIKOLA MARONE is a 89 y.o. male with medical history significant of dementia, chronic kidney disease, COPD, hyponatremia prostate cancer presenting with acute respiratory failure with hypoxia, COPD exacerbation, SIRS, weakness.  History primarily from patient's daughter in the setting of decompensated respiratory failure.  Patient noted to have been found shortness of breath with generalized rhonchi at home by EMS.  Patient noted to be sick x 3 days with URI symptoms.  Positive sick contact with similar symptoms pending evaluation today.  No fevers or chills.  Noted recent admission December 18 through January 6 for issues including closed wedge compression fracture of the T11 vertebrae as well as aspiration pneumonia.  Patient otherwise stable inpatient rehab course per the daughter.  No reported falls no worsening weakness.  No abdominal pain.  No reported nausea or vomiting.  Symptoms had sudden onset.  No reported diarrhea.  Per the daughter, patient does not know how to properly use inhalers associate with COPD at home. "   Admitted with influenza and COPD exacerbation Further hospital course and management as outlined below.   I assumed care on 3/5.   3/5 >> 3/11 -- Pt has been weaned down to 2-3 L/min oxygen, stable to downgrade to med/surg.  Unable to participate to any degree with PT and OT.  Requested Palliative care to re-visit for goals of care with family, as it does not appear that pt is capable to participate in rehab.       Assessment & Plan:   Influenza A S/p 5 day course tamiflu   Acute hypoxic respiratory failure Rapid response 2/27  morning for dyspnea, improved on bipap. Again required bipap for respiratory distress morning of 3/2. Now stable. CTA 3/3 no PE or other  complications but severe baseline emphysema. No baseline hypercarbia to qualify for bipap. Discuss with pulm today, given no osa diagnosis also won't qualify for cpap at discharge. --currently on 2L O2, with occasional O2 desat to 88% on 2L. --[facility]   Hypokalemia  --monitor and supplement PRN   Copd with exacerbation 2/2 flu.  --completed steroid burst and prednisone taper --cont hypertonic saline nebs with DuoNeb   Baseline dementia At baseline currently - home memantine - delirium precaution --palliative care provider following --daughter does not want hospice for pt.   HFmrEF with mild exacerbation Volume up clinically, bnp elevated.  Ef 40-45 on tte, this is a new problem, w/ regional wall motion abnormalities though no chest pain and only minimal trop elevation. Have reviewed w/ dr. Graciela Husbands of cardiology, he advises no additional w/u for the time being --hold lasix due to poor oral intake   T11-12 fractures status post fall  -underlying ankylosing spondylitis Recent hospitalization for this, s/p rehab stay   Anemia of chronic disease Stable H&H, monitor   Essential (primary) hypertension BP low normal, not on meds at home - monitor   Cerebral aneurysm, nonruptured CT head recent hospitalization shows stable aneurysm   Prostate cancer metastatic to bone (HCC) --cont home Nubeqa  Dysphagia --at high risk for aspiration, and have shown signs of aspiration.  During previous hospitalization, SLP rec dys 3 with nectar thick diet --Pt's mental status was much better this morning, alert and more energetic.  I was able to ask pt  if he would want feeding tube if he couldn't safely swallow, pt said in front of his daughters [name] at bedside and [name] on speaker phone) that he does NOT want feeding tube. --start Marinol for appetite stimulant, per daughter request   DVT prophylaxis: Lovenox SQ Code Status: DNR  Family Communication: daughter [name] updated at  bedside Level of care: Med-Surg Dispo:   The patient is from: home Anticipated d/c is to: home (daughter [name] declined SNF) Anticipated d/c date is: [name] said she has to get DME from [place] before discharge.   Subjective and Interval History:  Daughter reported pt doesn't want to eat, asked for appetite stimulant.     Objective: Vitals:   12/06/23 0718 12/06/23 0753 12/06/23 1709 12/06/23 1819  BP:  118/60 (!) 103/55   Pulse:  (!) 107 97 (!) 103  Resp:  19 19   Temp:  97.6 F (36.4 C) 98 F (36.7 C)   TempSrc:      SpO2: 94% 96% 96% (!) 86%  Weight:      Height:       No intake or output data in the 24 hours ending 12/06/23 1829  Filed Weights   12/04/23 0448 12/05/23 0413 12/06/23 0436  Weight: 61.6 kg 60.7 kg 62.3 kg    Examination:   Constitutional: NAD HEENT: conjunctivae and lids normal, EOMI CV: No cyanosis.   RESP: normal respiratory effort, on 2L SKIN: warm, dry   Data Reviewed: I have personally reviewed labs and imaging studies  Time spent: 35 minutes  Darlin Priestly, MD [facility] If 7PM-7AM, please contact night-coverage 12/06/2023, 6:29 PM

## 2023-12-07 DIAGNOSIS — J441 Chronic obstructive pulmonary disease with (acute) exacerbation: Secondary | ICD-10-CM | POA: Diagnosis not present

## 2023-12-07 LAB — POTASSIUM: Potassium: 3.6 mmol/L (ref 3.5–5.1)

## 2023-12-07 NOTE — Progress Notes (Signed)
 SATURATION QUALIFICATIONS: (This note is used to comply with regulatory documentation for home oxygen) Patient Saturations on Room Air at Rest = 86% Heart rate at rest 103 Patient Saturations on Room Air while Ambulating = patient unable to complete due to inability to ambulate Patient Saturations on 2 Liters of oxygen while Ambulating = patient unable to ambulate so unable to complete measurement.

## 2023-12-07 NOTE — Progress Notes (Signed)
 PROGRESS NOTE    Steven Elliott  YQM:[id number] DOB: 1930-02-05 DOA: 11/17/2023 [facility]CP: [facility]  215A/215A-AA  LOS: 20 days   Brief hospital course:   Assessment & Plan:   "Steven Elliott is a 89 y.o. male with medical history significant of dementia, chronic kidney disease, COPD, hyponatremia prostate cancer presenting with acute respiratory failure with hypoxia, COPD exacerbation, SIRS, weakness.  History primarily from patient's daughter in the setting of decompensated respiratory failure.  Patient noted to have been found shortness of breath with generalized rhonchi at home by EMS.  Patient noted to be sick x 3 days with URI symptoms.  Positive sick contact with similar symptoms pending evaluation today.  No fevers or chills.  Noted recent admission December 18 through January 6 for issues including closed wedge compression fracture of the T11 vertebrae as well as aspiration pneumonia.  Patient otherwise stable inpatient rehab course per the daughter.  No reported falls no worsening weakness.  No abdominal pain.  No reported nausea or vomiting.  Symptoms had sudden onset.  No reported diarrhea.  Per the daughter, patient does not know how to properly use inhalers associate with COPD at home. "   Admitted with influenza and COPD exacerbation Further hospital course and management as outlined below.   I assumed care on 3/5.   3/5 >> 3/11 -- Pt has been weaned down to 2-3 L/min oxygen, stable to downgrade to med/surg.  Unable to participate to any degree with PT and OT.  Requested Palliative care to re-visit for goals of care with family, as it does not appear that pt is capable to participate in rehab.       Assessment & Plan:   Influenza A S/p 5 day course tamiflu   Acute hypoxic respiratory failure Rapid response 2/27  morning for dyspnea, improved on bipap. Again required bipap for respiratory distress morning of 3/2. Now stable. CTA 3/3 no PE or other  complications but severe baseline emphysema. No baseline hypercarbia to qualify for bipap. Discuss with pulm today, given no osa diagnosis also won't qualify for cpap at discharge. --currently on 2L O2, with occasional O2 desat to 88% on 2L. --[facility] home 2L O2   Hypokalemia  --monitor and supplement PRN   Copd with exacerbation 2/2 flu.  --completed steroid burst and prednisone taper --cont hypertonic saline nebs with DuoNeb   Baseline dementia At baseline currently - home memantine - delirium precaution --palliative care provider following --daughter does not want hospice for pt.   HFmrEF with mild exacerbation Volume up clinically, bnp elevated.  Ef 40-45 on tte, this is a new problem, w/ regional wall motion abnormalities though no chest pain and only minimal trop elevation. Have reviewed w/ dr. Graciela Husbands of cardiology, he advises no additional w/u for the time being --hold lasix due to poor oral intake   T11-12 fractures status post fall  -underlying ankylosing spondylitis Recent hospitalization for this, s/p rehab stay   Anemia of chronic disease Stable H&H, monitor   Essential (primary) hypertension BP low normal, not on meds at home - monitor   Cerebral aneurysm, nonruptured CT head recent hospitalization shows stable aneurysm   Prostate cancer metastatic to bone (HCC) --cont home Nubeqa  Dysphagia --at high risk for aspiration, and have shown signs of aspiration.  During previous hospitalization, SLP rec dys 3 with nectar thick diet --Pt's mental status was much better this morning, alert and more energetic.  I was able to ask pt  if he would want feeding tube if he couldn't safely swallow, pt said in front of his daughters Steven Elliott at bedside and Steven Elliott on speaker phone) that he does NOT want feeding tube. --started Marinol for appetite stimulant, per daughter request   DVT prophylaxis: Lovenox SQ Code Status: DNR  Family Communication: daughter Steven Elliott updated at  bedside Level of care: Med-Surg Dispo:   The patient is from: home Anticipated d/c is to: home (daughter Steven Elliott declined SNF) Anticipated d/c date is: Steven Elliott said she has to get DME from [place] before discharge.   Subjective and Interval History:  Per daughter, pt ate better today.   Objective: Vitals:   12/07/23 0500 12/07/23 0738 12/07/23 0843 12/07/23 1456  BP:   101/65 112/67  Pulse:   93 87  Resp:   19 18  Temp:   (!) 97.1 F (36.2 C) (!) 97.5 F (36.4 C)  TempSrc:      SpO2:  94% 94% 99%  Weight: 60.3 kg     Height:       No intake or output data in the 24 hours ending 12/07/23 1853  Filed Weights   12/05/23 0413 12/06/23 0436 12/07/23 0500  Weight: 60.7 kg 62.3 kg 60.3 kg    Examination:   Constitutional: NAD, sleepy CV: No cyanosis.   RESP: normal respiratory effort, on 2L SKIN: warm, dry   Data Reviewed: I have personally reviewed labs and imaging studies  Time spent: 25 minutes  Darlin Priestly, MD [facility] If 7PM-7AM, please contact night-coverage 12/07/2023, 6:53 PM

## 2023-12-07 NOTE — Plan of Care (Addendum)
                                                     Palliative Care Progress Note   Patient Name: MORIS RATCHFORD       Date: 12/07/2023 DOB: August 09, 1930  Age: 89 y.o. MRN#: [id number] Attending Physician: Darlin Priestly, MD Primary Care Physician: [facility] Admit Date: 11/17/2023  Extensive chart review completed including labs, vital signs, imaging, progress notes, orders, and available advanced directive documents from current and previous encounters.   Goals are clear.  DNR with limited interventions remains.  MOST form is up-to-date as recorded in ACP tab of epic.  Plan remains for patient to discharge home with home health services.  Please reengage with PMT if goals change, at patient/family's request, or if patient's health deteriorates during hospitalization.  Family has PMT contact info and was encouraged to call with any acute palliative needs prior to discharge.  Thank you for allowing the Palliative Medicine Team to assist in the care of Fran Lowes.  [name] L. Bonita Quin, DNP, FNP-BC Palliative Medicine Team  No charge

## 2023-12-08 DIAGNOSIS — J441 Chronic obstructive pulmonary disease with (acute) exacerbation: Secondary | ICD-10-CM | POA: Diagnosis not present

## 2023-12-08 LAB — BASIC METABOLIC PANEL
Anion gap: 6 (ref 5–15)
BUN: 17 mg/dL (ref 8–23)
CO2: 26 mmol/L (ref 22–32)
Calcium: 7.9 mg/dL — ABNORMAL LOW (ref 8.9–10.3)
Chloride: 105 mmol/L (ref 98–111)
Creatinine, Ser: 0.53 mg/dL — ABNORMAL LOW (ref 0.61–1.24)
GFR, Estimated: >60 mL/min (ref >60)
Glucose, Bld: 93 mg/dL (ref 70–99)
Potassium: 3.8 mmol/L (ref 3.5–5.1)
Sodium: 137 mmol/L (ref 135–145)

## 2023-12-08 LAB — CBC
HCT: 33.6 % — ABNORMAL LOW (ref 39.0–52.0)
Hemoglobin: 11.2 g/dL — ABNORMAL LOW (ref 13.0–17.0)
MCH: 33.8 pg (ref 26.0–34.0)
MCHC: 33.3 g/dL (ref 30.0–36.0)
MCV: 101.5 fL — ABNORMAL HIGH (ref 80.0–100.0)
Platelets: 277 10*3/uL (ref 150–400)
RBC: 3.31 MIL/uL — ABNORMAL LOW (ref 4.22–5.81)
RDW: 15.1 % (ref 11.5–15.5)
WBC: 7.5 10*3/uL (ref 4.0–10.5)
nRBC: 0 % (ref 0.0–0.2)

## 2023-12-08 LAB — MAGNESIUM: Magnesium: 2 mg/dL (ref 1.7–2.4)

## 2023-12-08 MED ORDER — DRONABINOL 2.5 MG PO CAPS: 2.5000 mg | ORAL_CAPSULE | Freq: Two times a day (BID) | ORAL | 0 refills | Status: DC

## 2023-12-08 MED ORDER — IPRATROPIUM-ALBUTEROL 0.5-2.5 (3) MG/3ML IN SOLN: 3.0000 mL | RESPIRATORY_TRACT | 2 refills | Status: DC | PRN

## 2023-12-08 NOTE — Plan of Care (Signed)
  Problem: Education: Goal: Knowledge of General Education information will improve Description: Including pain rating scale, medication(s)/side effects and non-pharmacologic comfort measures Outcome: Progressing   Problem: Health Behavior/Discharge Planning: Goal: Ability to manage health-related needs will improve Outcome: Not Progressing   Problem: Clinical Measurements: Goal: Ability to maintain clinical measurements within normal limits will improve Outcome: Progressing Goal: Will remain free from infection Outcome: Progressing Goal: Diagnostic test results will improve Outcome: Progressing

## 2023-12-08 NOTE — Discharge Summary (Signed)
 Physician Discharge Summary   Steven Elliott  male DOB: 1929/10/17  ZOX:[id number]  PCP: [facility]  Admit date: 11/17/2023 Discharge date: 12/08/2023  Admitted From: home Disposition:  home (not a candidate for rehab due to pt's inability to participate with PT/OT) Daughter updated at bedside prior to discharge. Home Health: Yes through [place] CODE STATUS: DNR  Discharge Instructions     Discharge diet:   Complete by: As directed    Dys 3 with nectar thick fluid - -   No wound care   Complete by: As directed       Hospital Course:  For full details, please see H&P, progress notes, consult notes and ancillary notes.  Briefly,  Steven Elliott is a 89 y.o. male with medical history significant of dementia, chronic kidney disease, COPD, hyponatremia, prostate cancer presenting with acute respiratory failure with hypoxia, COPD exacerbation, weakness.    History primarily from patient's daughter.  Patient noted to have been found shortness of breath with generalized rhonchi at home by EMS.  Patient noted to be sick x 3 days with URI symptoms.    Noted recent admission December 18 through January 6 for issues including closed wedge compression fracture of the T11 vertebrae as well as aspiration pneumonia.      Acute hypoxic respiratory failure Rapid response 2/27 morning for dyspnea, improved on bipap. Again required bipap for respiratory distress morning of 3/2.  CTA 3/3 no PE or other complications but severe baseline emphysema. No baseline hypercarbia to qualify for bipap. Discuss with pulm, given no osa diagnosis also won't qualify for cpap at discharge. --Pt also intermittent aspiration events causing O2 desat. --Prior to discharge, pt was on 2L O2, with occasional O2 desat to 88% on 2L. --[facility]   Influenza A S/p 5 day course tamiflu   Copd with exacerbation 2/2 flu.  --completed steroid burst and prednisone taper --received  hypertonic saline nebs with DuoNeb  Dysphagia --at high risk for aspiration, and have shown signs of aspiration.  During previous hospitalization, SLP rec dys 3 with nectar thick diet --Pt's mental status was much better morning of 3/16, alert, coherent and more energetic.  I was able to ask pt if he would want feeding tube if he couldn't safely swallow, pt said in front of his daughters Steven Elliott at bedside and Steven Elliott on speaker phone) that he does NOT want feeding tube. --started Marinol for appetite stimulant, per daughter request   HFmrEF with mild exacerbation Volume up clinically, bnp elevated.  Ef 40-45 on tte, this is a new problem, w/ regional wall motion abnormalities though no chest pain and only minimal trop elevation. Have reviewed w/ dr. Graciela Husbands of cardiology, he advises no additional w/u for the time being --received IV lasix for diuresis initially then transitioned to oral lasix 40 mg daily on 11/24/23.  Oral lasix d/c'ed on 3/15 due to poor oral intake.   Baseline dementia At baseline currently - home memantine --palliative care consulted.  daughter does not want hospice for pt.  T11-12 fractures status post fall  -underlying ankylosing spondylitis Recent hospitalization for this, s/p rehab stay   Anemia of chronic disease Stable H&H   Essential (primary) hypertension, not currently active BP low normal, not on meds at home   Cerebral aneurysm, nonruptured CT head recent hospitalization shows stable aneurysm   Prostate cancer metastatic to bone (HCC) --cont home Nubeqa   Hypokalemia  --monitored and supplemented PRN   Unless noted  above, medications under "STOP" list are ones pt was not taking PTA.  Discharge Diagnoses:  Principal Problem:   COPD exacerbation (HCC) Active Problems:   Acute respiratory failure with hypoxia (HCC)   SIRS (systemic inflammatory response syndrome) (HCC)   Orthostatic hypotension   Dementia (HCC)   Prostate cancer metastatic to bone  (HCC)   Essential (primary) hypertension   Closed wedge compression fracture of T11 vertebra (HCC)   Elevated troponin   Pressure injury of skin   Protein-calorie malnutrition, severe   30 Day Unplanned Readmission Risk Score    Flowsheet Row ED to Hosp-Admission (Current) from 11/17/2023 in [facility]  30 Day Unplanned Readmission Risk Score (%) 45.78 Filed at 12/08/2023 0801       This score is the patient's risk of an unplanned readmission within 30 days of being discharged (0 -100%). The score is based on dignosis, age, lab data, medications, orders, and past utilization.   Low:  0-14.9   Medium: 15-21.9   High: 22-29.9   Extreme: 30 and above         Discharge Instructions:  Allergies as of 12/08/2023       Reactions   Procaine Anaphylaxis   Ciprofloxacin Other (See Comments), Nausea And Vomiting   Malaise, Rigor   Hydrocodone    Other reaction(s): Rash, Hives, Heart palpitations ()   Hydrocodone-acetaminophen Itching   Watery eyes   Lisinopril Other (See Comments)   Morphine Other (See Comments)   Daughter states vomiting   Omeprazole Other (See Comments)   GI reaction   Penicillins Other (See Comments)   Rabeprazole Other (See Comments)   GI reaction   Tramadol Other (See Comments)   Hallucinations         Medication List     STOP taking these medications    Calcium-D 600-400 MG-UNIT Tabs   magnesium oxide 400 (240 Mg) MG tablet Commonly known as: MAG-OX   olopatadine 0.1 % ophthalmic solution Commonly known as: PATANOL   traZODone 50 MG tablet Commonly known as: DESYREL       TAKE these medications    acetaminophen 500 MG tablet Commonly known as: TYLENOL Take 500 mg by mouth every 6 (six) hours as needed.   albuterol 108 (90 Base) MCG/ACT inhaler Commonly known as: VENTOLIN HFA Inhale 2 puffs into the lungs every 6 (six) hours as needed for wheezing or shortness of breath.   Calcium  Carb-Cholecalciferol 600-10 MG-MCG Tabs Take 1 tablet by mouth 2 (two) times daily.   cetirizine 5 MG tablet Commonly known as: ZYRTEC Take 1 tablet (5 mg total) by mouth daily.   clotrimazole 1 % cream Commonly known as: LOTRIMIN Apply 1 Application topically 2 (two) times daily.   cyanocobalamin 500 MCG tablet Commonly known as: VITAMIN B12 Take 1 tablet by mouth daily.   cyclobenzaprine 5 MG tablet Commonly known as: FLEXERIL Take 5 mg by mouth 3 (three) times daily as needed for muscle spasms.   darolutamide 300 MG tablet Commonly known as: NUBEQA Take 600 mg by mouth 2 (two) times daily with a meal.   dronabinol 2.5 MG capsule Commonly known as: MARINOL Take 1 capsule (2.5 mg total) by mouth 2 (two) times daily before lunch and supper.   DULoxetine 20 MG capsule Commonly known as: CYMBALTA Take 20 mg by mouth 2 (two) times daily.   famotidine 20 MG tablet Commonly known as: PEPCID Take 1 tablet (20 mg total) by mouth at bedtime.  feeding supplement Liqd Take 237 mLs by mouth 3 (three) times daily between meals.   fluticasone 50 MCG/ACT nasal spray Commonly known as: FLONASE Place 1 spray into both nostrils daily.   gabapentin 100 MG capsule Commonly known as: NEURONTIN Take 1 capsule (100 mg total) by mouth at bedtime.   galantamine 8 MG tablet Commonly known as: RAZADYNE Take 8 mg by mouth 2 (two) times daily. What changed: Another medication with the same name was removed. Continue taking this medication, and follow the directions you see here.   ipratropium 0.06 % nasal spray Commonly known as: ATROVENT Place 2 sprays into both nostrils 2 (two) times daily.   ipratropium-albuterol 0.5-2.5 (3) MG/3ML Soln Commonly known as: DUONEB Take 3 mLs by nebulization every 4 (four) hours as needed.   lidocaine 5 % Commonly known as: Lidoderm Place 1 patch onto the skin daily. Remove & Discard patch within 12 hours or as directed by MD   memantine 10 MG  tablet Commonly known as: NAMENDA Take 10 mg by mouth 2 (two) times daily.   multivitamin with minerals Tabs tablet Take 1 tablet by mouth daily.   tamsulosin 0.4 MG Caps capsule Commonly known as: FLOMAX Take 0.4 mg by mouth at bedtime.               Durable Medical Equipment  (From admission, onward)           Start     Ordered   12/06/23 1648  For home use only DME Nebulizer machine  Once       Question Answer Comment  Patient needs a nebulizer to treat with the following condition COPD (chronic obstructive pulmonary disease) (HCC)   Length of Need Lifetime   Additional equipment included Administration kit      12/06/23 1647   12/06/23 1646  For home use only DME oxygen  Once       Question Answer Comment  Length of Need 6 Months   Mode or (Route) Nasal cannula   Liters per Minute 2   Oxygen delivery system Gas      12/06/23 1645             Follow-up Information     [facility] up in 1 week(s).   Contact information: [address] [facility] [place] [address] [phone]                 Allergies  Allergen Reactions   Procaine Anaphylaxis   Ciprofloxacin Other (See Comments) and Nausea And Vomiting    Malaise, Rigor   Hydrocodone     Other reaction(s): Rash, Hives, Heart palpitations ()   Hydrocodone-Acetaminophen Itching    Watery eyes   Lisinopril Other (See Comments)   Morphine Other (See Comments)    Daughter states vomiting   Omeprazole Other (See Comments)    GI reaction   Penicillins Other (See Comments)   Rabeprazole Other (See Comments)    GI reaction   Tramadol Other (See Comments)    Hallucinations      The results of significant diagnostics from this hospitalization (including imaging, microbiology, ancillary and laboratory) are listed below for reference.   Consultations:   Procedures/Studies: CT HEAD WO CONTRAST ( ) Result Date: 12/04/2023 CLINICAL DATA:  Mental status  change of unknown cause EXAM: CT HEAD WITHOUT CONTRAST TECHNIQUE: Contiguous axial images were obtained from the base of the skull through the vertex without intravenous contrast. RADIATION DOSE REDUCTION: This exam was performed according  to the departmental dose-optimization program which includes automated exposure control, adjustment of the mA and/or kV according to patient size and/or use of iterative reconstruction technique. COMPARISON:  09/28/2023 FINDINGS: Brain: No change. No focal abnormality seen affecting the brainstem or cerebellum. Cerebral hemispheres show generalized atrophy with chronic small-vessel ischemic changes of the white matter. No sign of acute infarction, mass lesion, hemorrhage, hydrocephalus or extra-axial collection. 13 mm right MCA aneurysm as seen previously without evidence of hemorrhage. Vascular: 13 mm right MCA aneurysm as above. No change or acute feature. Skull: Normal Sinuses/Orbits: Chronic inflammatory changes of the left division of the sphenoid sinus. The other sinuses are clear. Orbits negative. Other: None IMPRESSION: 1. No acute CT finding. Atrophy and chronic small-vessel ischemic changes of the white matter. 2. 13 mm right MCA aneurysm as seen previously without evidence of hemorrhage. 3. Chronic inflammatory changes of the left division of the sphenoid sinus. Electronically Signed   By: Paulina Fusi M.D.   On: 12/04/2023 21:34   DG Chest Port 1 View Result Date: 11/28/2023 CLINICAL DATA:  Hypoxia EXAM: PORTABLE CHEST 1 VIEW COMPARISON:  X-ray 11/21/2023 and older.  CTA 11/22/2023. FINDINGS: Hyperinflation. Normal cardiopericardial silhouette calcified aorta. Chronic lung changes are again seen. There is mild lung base opacities as well, left greater than right. These are similar when compared to prior and adjusted for technique. Kyphotic x-ray obscures the apices. Overlapping cardiac leads. Prior CT scan describes small pleural effusions which are not as well  defined on this portable upright x-ray. IMPRESSION: No significant oval change when adjusted for technique. Electronically Signed   By: Karen Kays M.D.   On: 11/28/2023 13:10   US Abdomen Limited RUQ (LIVER/GB) Result Date: 11/27/2023 CLINICAL DATA:  Right upper quadrant pain EXAM: ULTRASOUND ABDOMEN LIMITED RIGHT UPPER QUADRANT COMPARISON:  Abdomen pelvis CT 09/28/2023.  Ultrasound 09/11/2023. FINDINGS: Gallbladder: Prior cholecystectomy Common bile duct: Diameter: 14 mm similar to the CT of 09/28/2023. No intrahepatic biliary dilatation. Liver: Diffusely echogenic hepatic parenchyma consistent with fatty liver infiltration. 13 mm mildly complex left-sided hepatic cystic focus, unchanged from previous as well. Portal vein is patent on color Doppler imaging with normal direction of blood flow towards the liver. Other: None. IMPRESSION: Fatty liver infiltration. Dilated common duct but similar to prior examination. Prior cholecystectomy. Electronically Signed   By: Karen Kays M.D.   On: 11/27/2023 12:59   CT Angio Chest Pulmonary Embolism (PE) W or WO Contrast Result Date: 11/22/2023 CLINICAL DATA:  Ongoing dyspnea and hypoxia EXAM: CT ANGIOGRAPHY CHEST WITH CONTRAST TECHNIQUE: Multidetector CT imaging of the chest was performed using the standard protocol during bolus administration of intravenous contrast. Multiplanar CT image reconstructions and MIPs were obtained to evaluate the vascular anatomy. RADIATION DOSE REDUCTION: This exam was performed according to the departmental dose-optimization program which includes automated exposure control, adjustment of the mA and/or kV according to patient size and/or use of iterative reconstruction technique. CONTRAST:  75mL OMNIPAQUE IOHEXOL 350 MG/ML SOLN COMPARISON:  CT chest 11/16/2018 FINDINGS: Cardiovascular: No filling defects within the pulmonary arteries to suggest acute pulmonary embolism. Mediastinum/Nodes: No axillary or supraclavicular adenopathy. No  mediastinal or hilar adenopathy. No pericardial fluid. Esophagus normal. Lungs/Pleura: Bilateral small effusions and mild basilar atelectasis. Severe centrilobular emphysema in the upper lobes. No pneumonia. No pneumothorax. Upper Abdomen: Limited view of the liver, kidneys, pancreas are unremarkable. Normal adrenal glands. Musculoskeletal: No aggressive osseous lesion. Compression deformity in the lower thoracic spine. Superior kyphosis. Review of the MIP images confirms the  above findings. IMPRESSION: 1. No evidence acute pulmonary embolism. 2. Small bilateral pleural effusions and basilar atelectasis. 3.  Emphysema (ICD10-J43.9). Electronically Signed   By: Genevive Bi M.D.   On: 11/22/2023 11:26   DG Chest Port 1 View Result Date: 11/21/2023 CLINICAL DATA:  Dyspnea, flu positive EXAM: PORTABLE CHEST 1 VIEW COMPARISON:  11/18/2023 FINDINGS: Infiltrate in the lower lungs is unchanged. Severe emphysema with apical lucency. Small left pleural effusion. No visible pneumothorax. Heart size and mediastinal contours are stable. IMPRESSION: Unchanged infiltrates in the lower lungs with small left pleural effusion. Severe emphysema. Electronically Signed   By: Tiburcio Pea M.D.   On: 11/21/2023 10:02   ECHOCARDIOGRAM COMPLETE Result Date: 11/19/2023    ECHOCARDIOGRAM REPORT   Patient Name:   MASAYOSHI COUZENS Date of Exam: 11/19/2023 Medical Rec #:  [id number]           Height:       68.0 in Accession #:    [id number]          Weight:       166.0 lb Date of Birth:  1929/09/23           BSA:          1.888 m Patient Age:    89 years            BP:           113/75 mmHg Patient Gender: M                   HR:           98 bpm. Exam Location:  [facility] Procedure: 2D Echo, Color Doppler and Cardiac Doppler (Both Spectral and Color            Flow Doppler were utilized during procedure). Indications:     Dyspnea R06.00  History:         Patient has no prior history of Echocardiogram examinations.                   COPD; Risk Factors:Hypertension.  Sonographer:     Cristela Blue Referring Phys:  [id number] NOAH BEDFORD WOUK Diagnosing Phys: Marcina Millard MD  Sonographer Comments: Technically challenging study due to limited acoustic windows. IMPRESSIONS  1. Left ventricular ejection fraction, by estimation, is 40 to 45%. The left ventricle has mildly decreased function. The left ventricle demonstrates regional wall motion abnormalities (see scoring diagram/findings for description). Left ventricular diastolic parameters are consistent with Grade I diastolic dysfunction (impaired relaxation).  2. Right ventricular systolic function is normal. The right ventricular size is normal.  3. The mitral valve is normal in structure. Trivial mitral valve regurgitation. No evidence of mitral stenosis.  4. The aortic valve is normal in structure. Aortic valve regurgitation is not visualized. No aortic stenosis is present.  5. The inferior vena cava is normal in size with greater than 50% respiratory variability, suggesting right atrial pressure of 3 mmHg. FINDINGS  Left Ventricle: Left ventricular ejection fraction, by estimation, is 40 to 45%. The left ventricle has mildly decreased function. The left ventricle demonstrates regional wall motion abnormalities. Strain imaging was not performed. The left ventricular  internal cavity size was normal in size. There is no left ventricular hypertrophy. Left ventricular diastolic parameters are consistent with Grade I diastolic dysfunction (impaired relaxation).  LV Wall Scoring: The mid inferoseptal segment and basal inferoseptal segment are hypokinetic. Right Ventricle: The right ventricular size is normal. No increase in right  ventricular wall thickness. Right ventricular systolic function is normal. Left Atrium: Left atrial size was normal in size. Right Atrium: Right atrial size was normal in size. Pericardium: There is no evidence of pericardial effusion. Mitral Valve: The mitral valve is  normal in structure. Trivial mitral valve regurgitation. No evidence of mitral valve stenosis. Tricuspid Valve: The tricuspid valve is normal in structure. Tricuspid valve regurgitation is trivial. No evidence of tricuspid stenosis. Aortic Valve: The aortic valve is normal in structure. Aortic valve regurgitation is not visualized. No aortic stenosis is present. Aortic valve mean gradient measures 2.0 mmHg. Aortic valve peak gradient measures 3.4 mmHg. Aortic valve area, by VTI measures 4.19 cm. Pulmonic Valve: The pulmonic valve was normal in structure. Pulmonic valve regurgitation is not visualized. No evidence of pulmonic stenosis. Aorta: The aortic root is normal in size and structure. Venous: The inferior vena cava is normal in size with greater than 50% respiratory variability, suggesting right atrial pressure of 3 mmHg. IAS/Shunts: No atrial level shunt detected by color flow Doppler. Additional Comments: 3D imaging was not performed.  LEFT VENTRICLE PLAX 2D LVIDd:         4.10 cm     Diastology LVIDs:         3.70 cm     LV e' medial:    6.20 cm/s LV PW:         1.00 cm     LV E/e' medial:  13.0 LV IVS:        1.00 cm     LV e' lateral:   5.00 cm/s LVOT diam:     2.00 cm     LV E/e' lateral: 16.1 LV SV:         65 LV SV Index:   35 LVOT Area:     3.14 cm  LV Volumes (MOD) LV vol d, MOD A2C: 92.0 ml LV vol d, MOD A4C: 74.4 ml LV vol s, MOD A2C: 67.8 ml LV vol s, MOD A4C: 35.4 ml LV SV MOD A2C:     24.2 ml LV SV MOD A4C:     74.4 ml LV SV MOD BP:      36.8 ml RIGHT VENTRICLE RV Basal diam:  2.30 cm RV Mid diam:    2.60 cm RV S prime:     16.00 cm/s TAPSE (M-mode): 1.8 cm LEFT ATRIUM           Index       RIGHT ATRIUM           Index LA diam:      3.10 cm 1.64 cm/m  RA Area:     12.90 cm LA Vol (A2C): 18.3 ml 9.69 ml/m  RA Volume:   22.40 ml  11.86 ml/m LA Vol (A4C): 15.0 ml 7.94 ml/m  AORTIC VALVE AV Area (Vmax):    2.98 cm AV Area (Vmean):   3.10 cm AV Area (VTI):     4.19 cm AV Vmax:            91.60 cm/s AV Vmean:          63.100 cm/s AV VTI:            0.156 m AV Peak Grad:      3.4 mmHg AV Mean Grad:      2.0 mmHg LVOT Vmax:         87.00 cm/s LVOT Vmean:        62.200 cm/s LVOT VTI:  0.208 m LVOT/AV VTI ratio: 1.33  AORTA Ao Root diam: 3.20 cm MITRAL VALVE                TRICUSPID VALVE MV Area (PHT): 4.63 cm     TR Peak grad:   11.8 mmHg MV Decel Time: 164 msec     TR Vmax:        172.00 cm/s MV E velocity: 80.70 cm/s MV A velocity: 127.00 cm/s  SHUNTS MV E/A ratio:  0.64         Systemic VTI:  0.21 m                             Systemic Diam: 2.00 cm Marcina Millard MD Electronically signed by Marcina Millard MD Signature Date/Time: 11/19/2023/1:13:23 PM    Final    DG Chest Port 1 View Result Date: 11/18/2023 CLINICAL DATA:  89 year old male with shortness of breath, flu like symptoms, rapid response. EXAM: PORTABLE CHEST 1 VIEW COMPARISON:  CTA chest yesterday and earlier. FINDINGS: Portable AP semi upright view at 0703 hours. Bullous emphysema and layering small pleural effusions on the CTA. Progressed patchy and indistinct right greater than left lung base opacity since radiographs yesterday. No pneumothorax or pulmonary edema. Stable cardiac size and mediastinal contours. Calcified aortic atherosclerosis. Paucity of bowel gas in the upper abdomen. Stable visualized osseous structures. IMPRESSION: Increasing patchy and indistinct right greater than left lung opacity - suspicious for developing infection, or perhaps aspiration - superimposed on Emphysema and small pleural effusions demonstrated on CTA yesterday. Electronically Signed   By: Odessa Fleming M.D.   On: 11/18/2023 07:15   CT Angio Chest PE W and/or Wo Contrast Result Date: 11/17/2023 CLINICAL DATA:  Shortness of breath, flu like symptoms x3 days EXAM: CT ANGIOGRAPHY CHEST WITH CONTRAST TECHNIQUE: Multidetector CT imaging of the chest was performed using the standard protocol during bolus administration of intravenous  contrast. Multiplanar CT image reconstructions and MIPs were obtained to evaluate the vascular anatomy. RADIATION DOSE REDUCTION: This exam was performed according to the departmental dose-optimization program which includes automated exposure control, adjustment of the mA and/or kV according to patient size and/or use of iterative reconstruction technique. CONTRAST:  75mL OMNIPAQUE IOHEXOL 350 MG/ML SOLN COMPARISON:  CT chest dated 09/28/2023 FINDINGS: Cardiovascular: Satisfactory opacification of the bilateral pulmonary arteries to the segmental level. No evidence of pulmonary embolism. Although not tailored for evaluation of the thoracic aorta, there is no evidence of thoracic aortic aneurysm or dissection. Atherosclerotic calcifications of the aortic arch. The heart is normal in size.  No pericardial effusion. Mild three-vessel coronary atherosclerosis. Mediastinum/Nodes: No suspicious mediastinal lymphadenopathy. Visualized thyroid is unremarkable. Lungs/Pleura: Small bilateral pleural effusions, left greater than right. Associated bilateral lower lobe atelectasis. Severe centrilobular and paraseptal emphysematous changes, upper lung predominant. No suspicious pulmonary nodules. No focal consolidation. No pneumothorax. Upper Abdomen: Visualized upper abdomen is notable for prior cholecystectomy, vascular calcifications, and an aortic stent. Musculoskeletal: Moderate compression fracture deformity at T11, chronic. Severe multilevel degenerative changes. Spinal canal is patent. Review of the MIP images confirms the above findings. IMPRESSION: No evidence of pulmonary embolism. Small bilateral pleural effusions, left greater than right. Associated bilateral lower lobe atelectasis. Aortic Atherosclerosis (ICD10-I70.0) and Emphysema (ICD10-J43.9). Electronically Signed   By: Charline Bills M.D.   On: 11/17/2023 03:34   DG Chest Portable 1 View Result Date: 11/17/2023 CLINICAL DATA:  Hypoxia, shortness of  breath EXAM: PORTABLE CHEST 1 VIEW  COMPARISON:  09/13/2023 FINDINGS: There is hyperinflation of the lungs compatible with COPD. Cardiomegaly. Bibasilar atelectasis or infiltrates. No effusions or acute bony abnormality. IMPRESSION: COPD. Bibasilar atelectasis or infiltrates. Electronically Signed   By: Charlett Nose M.D.   On: 11/17/2023 02:24   CT THORACIC SPINE WO CONTRAST Result Date: 11/12/2023 CLINICAL DATA:  Thoracic compression fracture. Evaluate T11 fracture. EXAM: CT THORACIC SPINE WITHOUT CONTRAST TECHNIQUE: Multidetector CT images of the thoracic were obtained using the standard protocol without intravenous contrast. RADIATION DOSE REDUCTION: This exam was performed according to the departmental dose-optimization program which includes automated exposure control, adjustment of the mA and/or kV according to patient size and/or use of iterative reconstruction technique. COMPARISON:  CT thoracic spine 09/28/2023. FINDINGS: Alignment: Unchanged 6 mm retrolisthesis of T11 on T12. 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 Subacute burst fracture of the T11 vertebra with interval vacuum phenomenon and sclerosis of fracture lines revealing involvement of the posterior elements, now making this a complete burst fracture (AO spine type A4). Retrolisthesis suggests injury to the posterior longitudinal ligament (modifier M1 type B). Paraspinal and other soft tissues: Small bilateral pleural effusions. Severe panlobular emphysema with 12 mm spiculated, solid nodule in the superior segment of the left lower lobe (axial image 62 series 3). This was likely obscured by effusion and adjacent atelectasis on the prior chest CT dated 09/28/2023. Atherosclerotic calcifications of the thoracic aorta and coronary arteries. Disc levels: Retrolisthesis at T11-12 results in at least moderate spinal canal  stenosis and severe bilateral neural foraminal narrowing at this level. IMPRESSION: 1. Subacute burst fracture of the T11 vertebra with interval vacuum phenomenon and sclerosis of fracture lines revealing involvement of the posterior elements, now making this a complete burst fracture (AO Spine type A4). Retrolisthesis suggests injury to the posterior longitudinal ligament (modifier M1 type B). 2. Retrolisthesis at T11-12 results in at least moderate spinal canal stenosis and severe bilateral neural foraminal narrowing at this level. 3. 12 mm spiculated, solid nodule in the superior segment of the left lower lobe. Consider one of the following in 3 months for both low-risk and high-risk individuals: (a) repeat chest CT, (b) follow-up PET-CT, or (c) tissue sampling. This recommendation follows the consensus statement: Guidelines for Management of Incidental Pulmonary Nodules Detected on CT Images: From the Fleischner Society 2017; Radiology 2017; 284:228-243. 4. Coronary artery calcifications. 5. Small bilateral pleural effusions. Emphysema (ICD10-J43.9). Electronically Signed   By: Orvan Falconer M.D.   On: 11/12/2023 09:48      Labs: BNP (last 3 results) Recent Labs    09/09/23 1003 11/17/23 2319 11/28/23 0440  BNP 252.5* 172.3* 147.6*   Basic Metabolic Panel: Recent Labs  Lab 12/03/23 0442 12/04/23 0529 12/06/23 0533 12/07/23 0549 12/08/23 0549  NA 136 136 136  --  137  K 3.0* 3.6 3.3* 3.6 3.8  CL 96* 103 105  --  105  CO2 28 26 24   --  26  GLUCOSE 101* 95 95  --  93  BUN 26* 26* 16  --  17  CREATININE 0.81 0.74 0.61  --  0.53*  CALCIUM 8.0* 7.8* 7.5*  --  7.9*  MG 1.9  --  1.8  --  2.0  PHOS  --  2.6  --   --   --    Liver Function Tests: No results for input(s): "AST", "ALT", "ALKPHOS", "BILITOT", "PROT", "ALBUMIN" in the last 168 hours. No results for  input(s): "LIPASE", "AMYLASE" in the last 168 hours. No results for input(s): "AMMONIA" in the last 168 hours. CBC: Recent  Labs  Lab 12/03/23 0442 12/06/23 0533 12/08/23 0549  WBC 10.4 8.6 7.5  HGB 11.4* 10.9* 11.2*  HCT 33.9* 31.7* 33.6*  MCV 100.3* 99.7 101.5*  PLT 302 255 277   Cardiac Enzymes: No results for input(s): "CKTOTAL", "CKMB", "CKMBINDEX", "TROPONINI" in the last 168 hours. BNP: Invalid input(s): "POCBNP" CBG: Recent Labs  Lab 12/04/23 2000  GLUCAP 88   D-Dimer No results for input(s): "DDIMER" in the last 72 hours. Hgb A1c No results for input(s): "HGBA1C" in the last 72 hours. Lipid Profile No results for input(s): "CHOL", "HDL", "LDLCALC", "TRIG", "CHOLHDL", "LDLDIRECT" in the last 72 hours. Thyroid function studies No results for input(s): "TSH", "T4TOTAL", "T3FREE", "THYROIDAB" in the last 72 hours.  Invalid input(s): "FREET3" Anemia work up No results for input(s): "VITAMINB12", "FOLATE", "FERRITIN", "TIBC", "IRON", "RETICCTPCT" in the last 72 hours. Urinalysis    Component Value Date/Time   COLORURINE YELLOW (A) 11/17/2023 2246   APPEARANCEUR HAZY (A) 11/17/2023 2246   APPEARANCEUR Clear 03/11/2022 1309   LABSPEC 1.024 11/17/2023 2246   LABSPEC 1.009 04/07/2012 1408   PHURINE 5.0 11/17/2023 2246   GLUCOSEU NEGATIVE 11/17/2023 2246   GLUCOSEU Negative 04/07/2012 1408   HGBUR MODERATE (A) 11/17/2023 2246   BILIRUBINUR NEGATIVE 11/17/2023 2246   BILIRUBINUR Negative 03/11/2022 1309   BILIRUBINUR Negative 04/07/2012 1408   KETONESUR NEGATIVE 11/17/2023 2246   PROTEINUR NEGATIVE 11/17/2023 2246   NITRITE NEGATIVE 11/17/2023 2246   LEUKOCYTESUR NEGATIVE 11/17/2023 2246   LEUKOCYTESUR Negative 04/07/2012 1408   Sepsis Labs Recent Labs  Lab 12/03/23 0442 12/06/23 0533 12/08/23 0549  WBC 10.4 8.6 7.5   Microbiology No results found for this or any previous visit (from the past 240 hours).   Total time spend on discharging this patient, including the last patient exam, discussing the hospital stay, instructions for ongoing care as it relates to all pertinent  caregivers, as well as preparing the medical discharge records, prescriptions, and/or referrals as applicable, is 35 minutes.    Darlin Priestly, MD  [facility] 12/08/2023, 8:48 AM

## 2023-12-08 NOTE — TOC Transition Note (Signed)
 Transition of Care [facility]) - Discharge Note   Patient Details  Name: Steven Elliott MRN: [id number] Date of Birth: 07/19/1930  [facility] CM/SW Contact:  Chapman Fitch, RN Phone Number: 12/08/2023, 11:27 AM   Clinical Narrative:     Yesterday orders for home o2, home health, ane nebulizer was submitted to the [facility]   Patient to discharge today Received call from daughter [name] that O2 has been delivered to home Signed DNR on chart [facility] arranged, EMS packet on chart Shaun with [facility] notified of discharge      Patient Goals and CMS Choice            Discharge Placement                       Discharge Plan and Services Additional resources added to the After Visit Summary for                                       Social Drivers of Health (SDOH) Interventions SDOH Screenings   Food Insecurity: Patient Declined (11/17/2023)  Housing: Patient Declined (11/17/2023)  Transportation Needs: Patient Declined (11/17/2023)  Utilities: Patient Declined (11/17/2023)  Social Connections: Unknown (11/17/2023)  Tobacco Use: Medium Risk (11/17/2023)     Readmission Risk Interventions    09/09/2023    1:41 PM  Readmission Risk Prevention Plan  Transportation Screening Complete  PCP or Specialist Appt within 3-5 Days Complete  HRI or Home Care Consult Complete  Social Work Consult for Recovery Care Planning/Counseling Complete  Palliative Care Screening Complete  Medication Review Oceanographer) Complete

## 2023-12-09 ENCOUNTER — Other Ambulatory Visit: Payer: Self-pay | Admitting: Hospitalist

## 2023-12-09 MED ORDER — DRONABINOL 2.5 MG PO CAPS: 2.5000 mg | ORAL_CAPSULE | Freq: Two times a day (BID) | ORAL | 0 refills | Status: AC

## 2023-12-09 MED ORDER — IPRATROPIUM-ALBUTEROL 0.5-2.5 (3) MG/3ML IN SOLN: 3.0000 mL | RESPIRATORY_TRACT | 2 refills | Status: DC | PRN

## 2024-01-20 DEATH — deceased
